# Patient Record
Sex: Female | Born: 1945 | Race: White | Hispanic: No | Smoking: Never smoker
Health system: Southern US, Community
[De-identification: ages and names within clinical notes are randomized; demographics above are authoritative.]

## PROBLEM LIST (undated history)

## (undated) DIAGNOSIS — H269 Unspecified cataract: Secondary | ICD-10-CM

## (undated) DIAGNOSIS — R002 Palpitations: Secondary | ICD-10-CM

## (undated) DIAGNOSIS — E278 Other specified disorders of adrenal gland: Secondary | ICD-10-CM

## (undated) DIAGNOSIS — K760 Fatty (change of) liver, not elsewhere classified: Secondary | ICD-10-CM

## (undated) DIAGNOSIS — Z5189 Encounter for other specified aftercare: Secondary | ICD-10-CM

## (undated) DIAGNOSIS — E279 Disorder of adrenal gland, unspecified: Secondary | ICD-10-CM

## (undated) DIAGNOSIS — K8051 Calculus of bile duct without cholangitis or cholecystitis with obstruction: Secondary | ICD-10-CM

## (undated) DIAGNOSIS — K5790 Diverticulosis of intestine, part unspecified, without perforation or abscess without bleeding: Secondary | ICD-10-CM

## (undated) DIAGNOSIS — Z8719 Personal history of other diseases of the digestive system: Secondary | ICD-10-CM

## (undated) DIAGNOSIS — I5189 Other ill-defined heart diseases: Secondary | ICD-10-CM

## (undated) DIAGNOSIS — D649 Anemia, unspecified: Secondary | ICD-10-CM

## (undated) DIAGNOSIS — K449 Diaphragmatic hernia without obstruction or gangrene: Secondary | ICD-10-CM

## (undated) DIAGNOSIS — K529 Noninfective gastroenteritis and colitis, unspecified: Secondary | ICD-10-CM

## (undated) DIAGNOSIS — K5010 Crohn's disease of large intestine without complications: Secondary | ICD-10-CM

## (undated) DIAGNOSIS — Z9889 Other specified postprocedural states: Secondary | ICD-10-CM

## (undated) DIAGNOSIS — K219 Gastro-esophageal reflux disease without esophagitis: Secondary | ICD-10-CM

## (undated) HISTORY — DX: Diverticulosis of intestine, part unspecified, without perforation or abscess without bleeding: K57.90

## (undated) HISTORY — DX: Encounter for other specified aftercare: Z51.89

## (undated) HISTORY — PX: COLONOSCOPY: SHX174

## (undated) HISTORY — DX: Personal history of other diseases of the digestive system: Z87.19

## (undated) HISTORY — DX: Diaphragmatic hernia without obstruction or gangrene: K44.9

## (undated) HISTORY — DX: Calculus of bile duct without cholangitis or cholecystitis with obstruction: K80.51

## (undated) HISTORY — PX: UPPER GASTROINTESTINAL ENDOSCOPY: SHX188

## (undated) HISTORY — DX: Unspecified cataract: H26.9

## (undated) HISTORY — DX: Disorder of adrenal gland, unspecified: E27.9

## (undated) HISTORY — PX: TUBAL LIGATION: SHX77

## (undated) HISTORY — DX: Fatty (change of) liver, not elsewhere classified: K76.0

## (undated) HISTORY — PX: CATARACT EXTRACTION: SUR2

## (undated) HISTORY — DX: Other specified disorders of adrenal gland: E27.8

## (undated) HISTORY — DX: Crohn's disease of large intestine without complications: K50.10

---

## 2005-11-21 ENCOUNTER — Encounter: Payer: Self-pay | Admitting: Internal Medicine

## 2005-11-22 ENCOUNTER — Encounter: Payer: Self-pay | Admitting: Internal Medicine

## 2005-11-24 ENCOUNTER — Encounter: Payer: Self-pay | Admitting: Internal Medicine

## 2005-11-25 ENCOUNTER — Encounter: Payer: Self-pay | Admitting: Internal Medicine

## 2007-04-03 HISTORY — PX: CHOLECYSTECTOMY: SHX55

## 2007-09-21 ENCOUNTER — Inpatient Hospital Stay (HOSPITAL_COMMUNITY): Admission: EM | Admit: 2007-09-21 | Discharge: 2007-09-30 | Payer: Self-pay | Admitting: Emergency Medicine

## 2007-09-23 ENCOUNTER — Encounter: Payer: Self-pay | Admitting: Internal Medicine

## 2007-09-24 ENCOUNTER — Encounter: Payer: Self-pay | Admitting: Internal Medicine

## 2007-09-25 ENCOUNTER — Ambulatory Visit: Payer: Self-pay | Admitting: Internal Medicine

## 2007-09-26 ENCOUNTER — Encounter (INDEPENDENT_AMBULATORY_CARE_PROVIDER_SITE_OTHER): Payer: Self-pay | Admitting: General Surgery

## 2007-10-16 ENCOUNTER — Encounter: Admission: RE | Admit: 2007-10-16 | Discharge: 2007-10-16 | Payer: Self-pay | Admitting: General Surgery

## 2007-10-29 ENCOUNTER — Encounter (INDEPENDENT_AMBULATORY_CARE_PROVIDER_SITE_OTHER): Payer: Self-pay | Admitting: *Deleted

## 2007-11-27 DIAGNOSIS — K219 Gastro-esophageal reflux disease without esophagitis: Secondary | ICD-10-CM | POA: Insufficient documentation

## 2007-11-27 DIAGNOSIS — K222 Esophageal obstruction: Secondary | ICD-10-CM

## 2007-11-27 DIAGNOSIS — D649 Anemia, unspecified: Secondary | ICD-10-CM | POA: Insufficient documentation

## 2007-11-27 DIAGNOSIS — K2980 Duodenitis without bleeding: Secondary | ICD-10-CM | POA: Insufficient documentation

## 2007-11-27 DIAGNOSIS — K2100 Gastro-esophageal reflux disease with esophagitis, without bleeding: Secondary | ICD-10-CM | POA: Insufficient documentation

## 2007-11-27 DIAGNOSIS — Z8719 Personal history of other diseases of the digestive system: Secondary | ICD-10-CM | POA: Insufficient documentation

## 2007-11-27 HISTORY — DX: Esophageal obstruction: K22.2

## 2007-11-27 HISTORY — DX: Gastro-esophageal reflux disease with esophagitis, without bleeding: K21.00

## 2008-01-02 ENCOUNTER — Telehealth: Payer: Self-pay | Admitting: Internal Medicine

## 2008-01-13 ENCOUNTER — Ambulatory Visit: Payer: Self-pay | Admitting: Internal Medicine

## 2008-01-14 LAB — CONVERTED CEMR LAB
AST: 14 units/L (ref 0–37)
Alkaline Phosphatase: 97 units/L (ref 39–117)
BUN: 20 mg/dL (ref 6–23)
Basophils Relative: 0.8 % (ref 0.0–3.0)
Calcium: 8.9 mg/dL (ref 8.4–10.5)
HCT: 31.1 % — ABNORMAL LOW (ref 36.0–46.0)
Iron: 17 ug/dL — ABNORMAL LOW (ref 42–145)
Lymphocytes Relative: 21.9 % (ref 12.0–46.0)
MCV: 77.3 fL — ABNORMAL LOW (ref 78.0–100.0)
Monocytes Relative: 8.8 % (ref 3.0–12.0)
Neutro Abs: 6.4 10*3/uL (ref 1.4–7.7)
Neutrophils Relative %: 65.6 % (ref 43.0–77.0)
Potassium: 3.8 meq/L (ref 3.5–5.1)
RBC: 4.03 M/uL (ref 3.87–5.11)
RDW: 14 % (ref 11.5–14.6)
Sed Rate: 77 mm/hr — ABNORMAL HIGH (ref 0–22)
Sodium: 137 meq/L (ref 135–145)
Total Protein: 7.9 g/dL (ref 6.0–8.3)
WBC: 9.8 10*3/uL (ref 4.5–10.5)

## 2008-02-16 ENCOUNTER — Ambulatory Visit: Payer: Self-pay | Admitting: Internal Medicine

## 2008-02-17 ENCOUNTER — Encounter: Payer: Self-pay | Admitting: Internal Medicine

## 2008-02-17 LAB — CONVERTED CEMR LAB
Basophils Absolute: 0 10*3/uL (ref 0.0–0.1)
Basophils Relative: 0.5 % (ref 0.0–3.0)
Eosinophils Absolute: 0.3 10*3/uL (ref 0.0–0.7)
HCT: 29.5 % — ABNORMAL LOW (ref 36.0–46.0)
MCHC: 34.1 g/dL (ref 30.0–36.0)
Monocytes Relative: 9.8 % (ref 3.0–12.0)

## 2008-02-24 ENCOUNTER — Ambulatory Visit: Admission: RE | Admit: 2008-02-24 | Discharge: 2008-02-24 | Payer: Self-pay | Admitting: Internal Medicine

## 2008-03-15 ENCOUNTER — Ambulatory Visit: Payer: Self-pay | Admitting: Internal Medicine

## 2008-03-15 LAB — CONVERTED CEMR LAB
Basophils Relative: 0.5 % (ref 0.0–3.0)
Eosinophils Relative: 2.7 % (ref 0.0–5.0)
HCT: 35 % — ABNORMAL LOW (ref 36.0–46.0)
Hemoglobin: 11.9 g/dL — ABNORMAL LOW (ref 12.0–15.0)
MCV: 78.1 fL (ref 78.0–100.0)
Monocytes Absolute: 0.6 10*3/uL (ref 0.1–1.0)
RBC: 4.48 M/uL (ref 3.87–5.11)

## 2009-02-07 ENCOUNTER — Ambulatory Visit: Payer: Self-pay | Admitting: Family Medicine

## 2009-02-07 ENCOUNTER — Inpatient Hospital Stay (HOSPITAL_COMMUNITY): Admission: EM | Admit: 2009-02-07 | Discharge: 2009-02-09 | Payer: Self-pay | Admitting: Emergency Medicine

## 2009-02-09 ENCOUNTER — Encounter: Payer: Self-pay | Admitting: Family Medicine

## 2009-02-14 ENCOUNTER — Ambulatory Visit: Payer: Self-pay | Admitting: Family Medicine

## 2009-02-14 LAB — CONVERTED CEMR LAB: Hemoglobin: 9 g/dL

## 2009-02-16 ENCOUNTER — Ambulatory Visit: Payer: Self-pay | Admitting: Family Medicine

## 2009-07-01 ENCOUNTER — Ambulatory Visit: Payer: Self-pay | Admitting: Family Medicine

## 2009-07-01 ENCOUNTER — Encounter: Payer: Self-pay | Admitting: Family Medicine

## 2009-07-01 LAB — CONVERTED CEMR LAB
Eosinophils Absolute: 0.2 10*3/uL (ref 0.0–0.7)
Ferritin: 12 ng/mL (ref 10–291)
HCT: 38 % (ref 36.0–46.0)
Hemoglobin: 12.4 g/dL (ref 12.0–15.0)
Monocytes Relative: 9 % (ref 3–12)
Neutro Abs: 4.9 10*3/uL (ref 1.7–7.7)
Neutrophils Relative %: 66 % (ref 43–77)
Saturation Ratios: 14 % — ABNORMAL LOW (ref 20–55)
TIBC: 365 ug/dL (ref 250–470)
WBC: 7.4 10*3/uL (ref 4.0–10.5)

## 2009-07-04 ENCOUNTER — Telehealth: Payer: Self-pay | Admitting: Family Medicine

## 2009-07-14 ENCOUNTER — Encounter: Admission: RE | Admit: 2009-07-14 | Discharge: 2009-07-14 | Payer: Self-pay | Admitting: Family Medicine

## 2009-07-19 ENCOUNTER — Telehealth: Payer: Self-pay | Admitting: Family Medicine

## 2009-07-19 ENCOUNTER — Encounter: Payer: Self-pay | Admitting: Family Medicine

## 2009-11-16 ENCOUNTER — Ambulatory Visit: Payer: Self-pay | Admitting: Family Medicine

## 2009-11-16 ENCOUNTER — Encounter: Payer: Self-pay | Admitting: Family Medicine

## 2009-11-16 ENCOUNTER — Encounter: Payer: Self-pay | Admitting: *Deleted

## 2009-11-17 LAB — CONVERTED CEMR LAB
HCT: 36.4 % (ref 36.0–46.0)
Platelets: 293 10*3/uL (ref 150–400)
RDW: 24.6 % — ABNORMAL HIGH (ref 11.5–15.5)
WBC: 5.5 10*3/uL (ref 4.0–10.5)

## 2010-02-01 ENCOUNTER — Ambulatory Visit: Payer: Self-pay | Admitting: Family Medicine

## 2010-02-01 ENCOUNTER — Encounter: Payer: Self-pay | Admitting: Family Medicine

## 2010-02-04 LAB — CONVERTED CEMR LAB
MCV: 83 fL (ref 78.0–100.0)
RDW: 16.7 % — ABNORMAL HIGH (ref 11.5–15.5)

## 2010-04-25 ENCOUNTER — Encounter (INDEPENDENT_AMBULATORY_CARE_PROVIDER_SITE_OTHER): Payer: Self-pay | Admitting: *Deleted

## 2010-05-02 NOTE — Letter (Signed)
Summary: Generic Letter  [facility]  [address]   [place], [place] [address]   Phone: [phone]  Fax: [phone]    07/19/2009  TAMEYAH KOCH [address] [place], [place]  [address]  Dear Ms. Aubert,   I received the results of your DEXA scan and it shows who have some bone diminerlaiztion.  I think at this point as long as we continue the vitamin D and calcium pills we should be fine.  At our next visit I would like to check your vitamin D level and we can decide if any changes would be needed then.  I think we should repeat the DEXA scan again in 2 years.  Hope you are doing well.          Sincerely,   Hulan Saas DO  Appended Document: Generic Letter mailed.

## 2010-05-02 NOTE — Assessment & Plan Note (Signed)
Summary: f/up,tcb   Vital Signs:  Patient profile:   65 year old female Height:      64 inches Weight:      162 pounds BMI:     27.91 BSA:     1.79 Temp:     97.9 degrees F Pulse rate:   87 / minute BP sitting:   141 / 80  Vitals Entered By: Christen Bame CMA (July 01, 2009 9:23 AM) CC: f/u- anemia and DM Is Patient Diabetic? Yes Pain Assessment Patient in pain? no        Primary Care Provider:  Hulan Saas  CC:  f/u- anemia and DM.  History of Present Illness: Pt is here for f/u with her anemia.  Pt is diong ver well, lots of energy, been active somewhat.  Does not feel that her hgb is low, pt has been admitted  multiple times for her anemia but recently seems to be in good control.  Pt is taking iron only once daily at this point, does state dark stool but no blood, no smell.  Pt denies fever, chills, nausea, vomiting, diarrhea or constipation  Pt has hx of GERD and esophageal stricture.  Pt is on omeprazole no problems. Pt has hx of DM from previous d/c from hospital.  Last A1C was 5.0.  Will check again today but pt denies any increase urinary frequency polyphagia  Has not had a mammogram for years.  No family hx of breast cancer Never had a dexa scan.  Take Vitamin D daily Declined pap smear.  Never had abnormal.  Not sexually active    Habits & Providers  Alcohol-Tobacco-Diet     Tobacco Status: never  Current Medications (verified): 1)  Prilosec 20 Mg Cpdr (Omeprazole) .... Take 1 Tablet By Mouth Two Times A Day 2)  Ferrous Sulfate 325 (65 Fe) Mg Tabs (Ferrous Sulfate) .... Take 1 Tab By Mouth Daily 3)  Vitamin D 1000 Unit Tabs (Cholecalciferol) .... Take 1 Tab Daily  Allergies (verified): No Known Drug Allergies  Past History:  Past medical, surgical, family and social histories (including risk factors) reviewed, and no changes noted (except as noted below).  Past Medical History: Reviewed history from 11/27/2007 and no changes required. Current  Problems:  DIABETES MELLITUS (ICD-250.00) ANEMIA (ICD-285.9) GASTRITIS, HX OF (ICD-V12.79) DUODENITIS (ICD-535.60) Hx of ESOPHAGEAL STRICTURE (ICD-530.3) COLITIS, HX OF (ICD-V12.79) GERD (ICD-530.81)  Past Surgical History: Reviewed history from 01/13/2008 and no changes required. Cholecystectomy Tubal Ligation  Family History: Reviewed history from 02/16/2009 and no changes required. Bone cancer  Grandfather Family History of Colon Cancer:Aunt, sister died of stomach cancer  Social History: Reviewed history from 01/13/2008 and no changes required. Occupation: Retired Patient has never smoked.  Alcohol Use - no Daily Caffeine Use Illicit Drug Use - no Patient does not get regular exercise.   Review of Systems       denies fever, chills, nausea, vomiting, diarrhea or constipation    Impression & Recommendations:  Problem # 1:  ANEMIA (ICD-285.9) Assessment Improved  will get labs today.  Pt denies any red flags.  Continue the iron and ppi.   Orders: CBC w/Diff-[facility] (93810) Iron Binding Cap (TIBC)-[facility] ([address]) Iron -[facility] [phone]) Ferritin-[facility] [phone]) [place]- Est  Level 4 ([address]61)  Her updated medication list for this problem includes:    Ferrous Sulfate 325 (65 Fe) Mg Tabs (Ferrous sulfate) .Marland Kitchen... Take 1 tab by mouth daily  Problem # 2:  GERD (ICD-530.81) Assessment: Improved  continue ppi.  Should f/u with GI dr.  Denies any red flaqgs, good weight gain, BMI near 27  Her updated medication list for this problem includes:    Prilosec 20 Mg Cpdr (Omeprazole) .Marland Kitchen... Take 1 tablet by mouth two times a day  Orders: CBC w/Diff-[facility] (97353) [place]- Est  Level 4 ([address]24)  Problem # 3:  DIABETES MELLITUS (ICD-250.00) Assessment: Unchanged Pt A!C again is <6.0, makes me question DM as actual dx.  Pt monitor diet well Orders: A1C-[facility] (26834) [place]- Est  Level 4 ([address]22)  Problem # 4:  Preventive Health Care (ICD-V70.0) Assessment: Comment Only will get  mammogram and Dexa Scan.  Declined pap smear  Complete Medication List: 1)  Prilosec 20 Mg Cpdr (Omeprazole) .... Take 1 tablet by mouth two times a day 2)  Ferrous Sulfate 325 (65 Fe) Mg Tabs (Ferrous sulfate) .... Take 1 tab by mouth daily 3)  Vitamin D 1000 Unit Tabs (Cholecalciferol) .... Take 1 tab daily  Other Orders: Mammogram (Screening) (Mammo) Dexa scan (Dexa scan)  Patient Instructions: 1)  Good to see you keep taking your iron and stomach pill, i think they are helping. 2)  I will call if the results of your lab test is abnormal otherwise no news is good news 3)  I want you to have a mammograqm and dexa scan done.  Our office will set up those appointments and call you. 4)  I want you to see me again in 6-9 months Prescriptions: PRILOSEC 20 MG CPDR (OMEPRAZOLE) Take 1 tablet by mouth two times a day  #60 x 10   Entered and Authorized by:   Hulan Saas DO   Signed by:   Hulan Saas DO on 07/01/2009   Method used:   Electronically to        [facility]  [phone]* (retail)       [address]       [place], [place]  [address]       Ph: [phone] or [phone]       Fax: [phone]   RxID:   [id number]   Laboratory Results   Blood Tests   Date/Time Received: July 01, 2009 9:19 AM  Date/Time Reported: July 01, 2009 9:29 AM   HGBA1C: 5.6%   (Normal Range: Non-Diabetic - 3-6%   Control Diabetic - 6-8%)  Comments: ...........test performed by...........Marland KitchenHedy Camara, CMA     Prevention & Chronic Care Immunizations   Influenza vaccine: Not documented    Tetanus booster: Not documented    Pneumococcal vaccine: Not documented    H. zoster vaccine: Not documented  Colorectal Screening   Hemoccult: Not documented    Colonoscopy: Location[facility].    (09/23/2007)   Colonoscopy due: 09/22/2012  Other Screening   Pap smear: Not documented    Mammogram: Not documented   Mammogram  action/deferral: Ordered  (07/01/2009)    DXA bone density scan: Not documented   DXA bone density action/deferral: Ordered  (07/01/2009)   Smoking status: never  (07/01/2009)  Diabetes Mellitus   HgbA1C: 5.6  (07/01/2009)    Eye exam: Not documented    Foot exam: Not documented   High risk foot: Not documented   Foot care education: Not documented    Urine microalbumin/creatinine ratio: Not documented  Lipids   Total Cholesterol: Not documented   LDL: Not documented   LDL Direct: Not documented   HDL: Not documented   Triglycerides:  Not documented  Self-Management Support :    Diabetes self-management support: Not documented   Nursing Instructions: Schedule screening mammogram (see order) Schedule screening DXA bone density scan (see order)    Appended Document: f/up,tcb need PE     Allergies: No Known Drug Allergies  Physical Exam  General:  NAD, very pleasant. Mouth:  Oral mucosa and oropharynx without lesions or exudates.  Teeth in good repair. Lungs:  Normal respiratory effort, chest expands symmetrically. Lungs are clear to auscultation, no crackles or wheezes. Heart:  Normal rate and regular rhythm. S1 and S2 normal 1/6 SEM LLSB Abdomen:  Bowel sounds positive,abdomen soft and non-tender without masses, organomegaly or hernias noted. Pulses:  2+ Extremities:  no edema Skin:  nail beds are fairly pale, pt states much improvement.   Complete Medication List: 1)  Prilosec 20 Mg Cpdr (Omeprazole) .... Take 1 tablet by mouth two times a day 2)  Ferrous Sulfate 325 (65 Fe) Mg Tabs (Ferrous sulfate) .... Take 1 tab by mouth daily 3)  Vitamin D 1000 Unit Tabs (Cholecalciferol) .... Take 1 tab daily

## 2010-05-02 NOTE — Progress Notes (Signed)
  Phone Note Outgoing Call   Summary of Call: Pt was called to tell lab results.

## 2010-05-02 NOTE — Progress Notes (Signed)
  Have called pt multiple times to tell her the results of her DEXA scan but unable to get through.   Voice mail has a generic answering message so do not feel comfortable leaving a message.

## 2010-05-02 NOTE — Assessment & Plan Note (Signed)
Summary: f/up,tcb   Vital Signs:  Patient profile:   65 year old female Height:      64 inches Weight:      158.8 pounds BMI:     27.36 Temp:     98.1 degrees F oral Pulse rate:   77 / minute BP sitting:   148 / 84  (left arm) Cuff size:   regular  Vitals Entered By: Levert Feinstein LPN (February 02, 1092 10:19 AM) CC: f/u anemia Is Patient Diabetic? Yes Did you bring your meter with you today? No Pain Assessment Patient in pain? no        Primary Care Provider:  Hulan Saas  CC:  f/u anemia.  History of Present Illness: 65 yo female here for  anemia.  Pt is doing very well,good energy,more active.  Pt is taking iron only twice daily at this point, does state dark stool but no blood, no smell, mild constipation but taking a stool softner.  Pt denies fever, chills, nausea, vomiting, diarrhea or constipation  Pt has hx of GERD and esophageal stricture.  Pt is on omeprazole no problems.   Pt has had mammogram and DEXA scan recently  Declined pap smear.  Never had abnormal.  Not sexually active    Habits & Providers  Alcohol-Tobacco-Diet     Tobacco Status: never     Tobacco Counseling: not indicated; no tobacco use  Current Medications (verified): 1)  Prilosec 20 Mg Cpdr (Omeprazole) .... Take 1 Tablet By Mouth Two Times A Day 2)  Ferrous Sulfate 325 (65 Fe) Mg Tabs (Ferrous Sulfate) .... Take 1 Tab Two Times A Day 3)  Vitamin D 1000 Unit Tabs (Cholecalciferol) .... Take 1 Tab Daily  Allergies (verified): No Known Drug Allergies  Past History:  Past medical, surgical, family and social histories (including risk factors) reviewed, and no changes noted (except as noted below).  Past Medical History: Reviewed history from 11/27/2007 and no changes required. Current Problems:  DIABETES MELLITUS (ICD-250.00) ANEMIA (ICD-285.9) GASTRITIS, HX OF (ICD-V12.79) DUODENITIS (ICD-535.60) Hx of ESOPHAGEAL STRICTURE (ICD-530.3) COLITIS, HX OF (ICD-V12.79) GERD  (ICD-530.81)  Past Surgical History: Reviewed history from 01/13/2008 and no changes required. Cholecystectomy Tubal Ligation  Family History: Reviewed history from 02/16/2009 and no changes required. Bone cancer  Grandfather Family History of Colon Cancer:Aunt, sister died of stomach cancer  Social History: Reviewed history from 01/13/2008 and no changes required. Occupation: Retired Patient has never smoked.  Alcohol Use - no Daily Caffeine Use Illicit Drug Use - no Patient does not get regular exercise.   Review of Systems       denies fever, chills, nausea, vomiting, diarrhea or constipation   Physical Exam  General:  NAD, very pleasant. Eyes:  No corneal or conjunctival inflammation noted. EOMI. Perrla. pink conjunctiva Mouth:  Oral mucosa and oropharynx without lesions or exudates.  Teeth in good repair. Lungs:  Normal respiratory effort, chest expands symmetrically. Lungs are clear to auscultation, no crackles or wheezes. Heart:  Normal rate and regular rhythm. S1 and S2 normal 1/6 SEM LLSB, stable Abdomen:  Bowel sounds positive,abdomen soft and non-tender without masses, organomegaly or hernias noted. Pulses:  2+ Extremities:  no edema   Impression & Recommendations:  Problem # 1:  ANEMIA (ICD-285.9) seems stable no red flags, will get CBC and then can change to every 3-6 months.  Gave pt red flags to look for continue same iron regimen will recheck iron levels in 6 months or so.  Her updated  medication list for this problem includes:    Ferrous Sulfate 325 (65 Fe) Mg Tabs (Ferrous sulfate) .Marland Kitchen... Take 1 tab two times a day  Orders: CBC-[facility] (93790) [place]- Est Level  3 (24097)  Complete Medication List: 1)  Prilosec 20 Mg Cpdr (Omeprazole) .... Take 1 tablet by mouth two times a day 2)  Ferrous Sulfate 325 (65 Fe) Mg Tabs (Ferrous sulfate) .... Take 1 tab two times a day 3)  Vitamin D 1000 Unit Tabs (Cholecalciferol) .... Take 1 tab daily  Patient  Instructions: 1)  you are doing great 2)  see me in 3-6 months Prescriptions: FERROUS SULFATE 325 (65 FE) MG TABS (FERROUS SULFATE) take 1 tab two times a day  #186 x 3   Entered and Authorized by:   Hulan Saas DO   Signed by:   Hulan Saas DO on 02/01/2010   Method used:   Electronically to        [facility]  [phone]* (retail)       [address]       [place], [place]  [address]       Ph: [phone] or [phone]       Fax: [phone]   RxID:   [id number]    Orders Added: 1)  CBC-[facility] [85027] 2)  [place]- Est Level  3 [74128]    Prevention & Chronic Care Immunizations   Influenza vaccine: Not documented    Tetanus booster: Not documented    Pneumococcal vaccine: Not documented    H. zoster vaccine: Not documented  Colorectal Screening   Hemoccult: Not documented    Colonoscopy: Location:  [facility].    (09/23/2007)   Colonoscopy due: 09/22/2012  Other Screening   Pap smear: Not documented    Mammogram: ASSESSMENT: Negative - BI-RADS 1^MM DIGITAL SCREENING  (07/14/2009)   Mammogram action/deferral: Ordered  (07/01/2009)    DXA bone density scan: Not documented   DXA bone density action/deferral: Ordered  (07/01/2009)   Smoking status: never  (02/01/2010)  Lipids   Total Cholesterol: Not documented   LDL: Not documented   LDL Direct: 144  (11/16/2009)   HDL: Not documented   Triglycerides: Not documented

## 2010-05-02 NOTE — Miscellaneous (Signed)
  Clinical Lists Changes  Orders: Added new Test order of CBC-[facility] (01599) - Signed Added new Test order of A1C-[facility] (68957) - Signed Added new Test order of Direct LDL-[facility] (02202-669[address]) - Signed   orders for lab of CBC, A1c and direct LDL

## 2010-05-04 NOTE — Letter (Signed)
Summary: Generic Letter  [facility]  [address]   [place], [place] [address]   Phone: [phone]  Fax: [phone]    04/25/2010  [address] [place], [place]  [address]  Dear Ms. Levert,  We are happy to let you know that since you are covered under Medicare you are able to have a FREE Welcome to Medicare visit at the [facility] to discuss your HEALTH. There will be no co-payment.  At this visit you will meet with your doctor and Lamont Dowdy an expert in wellness and the health coach at our clinic.  At this visit we will discuss ways to keep you healthy and feeling well.  This visit will not replace your regular doctor visit and we cannot refill medications.     You will need to plan to be here at least one hour to talk about your medical history, your current status, review all of your medications, and discuss your future plans for your health.  This information will be entered into your record for your doctor to have and review.  If you are interested in staying healthy, this type of visit can help.  Please call the office at: [phone], to schedule a "Medicare Wellness Visit".  The day of the visit you should bring in all of your medications, including any vitamins, herbs, over the counter products you take.  Make a list of all the other doctors that you see, so we know who they are. If you have any other health documents please bring them.  We look forward to helping you stay healthy.  Sincerely,   [facility]  [facility]

## 2010-05-06 ENCOUNTER — Encounter: Payer: Self-pay | Admitting: *Deleted

## 2010-06-16 ENCOUNTER — Emergency Department (HOSPITAL_COMMUNITY)
Admission: EM | Admit: 2010-06-16 | Discharge: 2010-06-16 | Disposition: A | Discharge status: Home / Self Care | Payer: Medicare Other | Attending: Emergency Medicine | Admitting: Emergency Medicine

## 2010-06-16 ENCOUNTER — Encounter (HOSPITAL_COMMUNITY): Payer: Self-pay | Admitting: Radiology

## 2010-06-16 ENCOUNTER — Encounter: Payer: Self-pay | Admitting: Family Medicine

## 2010-06-16 ENCOUNTER — Emergency Department (HOSPITAL_COMMUNITY): Payer: Medicare Other

## 2010-06-16 DIAGNOSIS — K219 Gastro-esophageal reflux disease without esophagitis: Secondary | ICD-10-CM | POA: Insufficient documentation

## 2010-06-16 DIAGNOSIS — R945 Abnormal results of liver function studies: Secondary | ICD-10-CM | POA: Insufficient documentation

## 2010-06-16 DIAGNOSIS — N390 Urinary tract infection, site not specified: Secondary | ICD-10-CM | POA: Insufficient documentation

## 2010-06-16 DIAGNOSIS — R197 Diarrhea, unspecified: Secondary | ICD-10-CM | POA: Insufficient documentation

## 2010-06-16 HISTORY — DX: Gastro-esophageal reflux disease without esophagitis: K21.9

## 2010-06-16 HISTORY — DX: Noninfective gastroenteritis and colitis, unspecified: K52.9

## 2010-06-16 HISTORY — DX: Anemia, unspecified: D64.9

## 2010-06-16 LAB — DIFFERENTIAL
Basophils Absolute: 0 K/uL (ref 0.0–0.1)
Basophils Relative: 0 % (ref 0–1)
Eosinophils Absolute: 0.1 10*3/uL (ref 0.0–0.7)
Eosinophils Relative: 0 % (ref 0–5)
Lymphocytes Relative: 13 % (ref 12–46)
Lymphs Abs: 1.5 10*3/uL (ref 0.7–4.0)
Monocytes Absolute: 1.4 10*3/uL — ABNORMAL HIGH (ref 0.1–1.0)
Monocytes Relative: 12 % (ref 3–12)
Neutro Abs: 8.2 K/uL — ABNORMAL HIGH (ref 1.7–7.7)
Neutrophils Relative %: 74 % (ref 43–77)

## 2010-06-16 LAB — URINALYSIS, ROUTINE W REFLEX MICROSCOPIC
Glucose, UA: NEGATIVE mg/dL
Ketones, ur: 15 mg/dL — AB
Nitrite: POSITIVE — AB
Protein, ur: 30 mg/dL — AB
Specific Gravity, Urine: 1.025 (ref 1.005–1.030)
Urobilinogen, UA: 4 mg/dL — ABNORMAL HIGH (ref 0.0–1.0)
pH: 6 (ref 5.0–8.0)

## 2010-06-16 LAB — COMPREHENSIVE METABOLIC PANEL
Alkaline Phosphatase: 272 U/L — ABNORMAL HIGH (ref 39–117)
BUN: 16 mg/dL (ref 6–23)
CO2: 25 mEq/L (ref 19–32)
Chloride: 108 mEq/L (ref 96–112)
GFR calc Af Amer: 58 mL/min — ABNORMAL LOW (ref >60)
Potassium: 3.9 mEq/L (ref 3.5–5.1)
Sodium: 138 mEq/L (ref 135–145)
Total Bilirubin: 1.8 mg/dL — ABNORMAL HIGH (ref 0.3–1.2)
Total Protein: 8.8 g/dL — ABNORMAL HIGH (ref 6.0–8.3)

## 2010-06-16 LAB — CBC
HCT: 34.9 % — ABNORMAL LOW (ref 36.0–46.0)
Hemoglobin: 11.5 g/dL — ABNORMAL LOW (ref 12.0–15.0)
MCH: 25.7 pg — ABNORMAL LOW (ref 26.0–34.0)
MCHC: 33 g/dL (ref 30.0–36.0)
MCV: 77.9 fL — ABNORMAL LOW (ref 78.0–100.0)
Platelets: 363 10*3/uL (ref 150–400)
RBC: 4.48 MIL/uL (ref 3.87–5.11)
RDW: 14.2 % (ref 11.5–15.5)
WBC: 11.2 10*3/uL — ABNORMAL HIGH (ref 4.0–10.5)

## 2010-06-16 LAB — COMPREHENSIVE METABOLIC PANEL WITH GFR
ALT: 173 U/L — ABNORMAL HIGH (ref 0–35)
AST: 248 U/L — ABNORMAL HIGH (ref 0–37)
Albumin: 3.5 g/dL (ref 3.5–5.2)
Calcium: 9 mg/dL (ref 8.4–10.5)
Creatinine, Ser: 1.14 mg/dL (ref 0.4–1.2)
GFR calc non Af Amer: 48 mL/min — ABNORMAL LOW (ref >60)
Glucose, Bld: 129 mg/dL — ABNORMAL HIGH (ref 70–99)

## 2010-06-16 LAB — URINE MICROSCOPIC-ADD ON

## 2010-06-16 LAB — LIPASE, BLOOD: Lipase: 41 U/L (ref 11–59)

## 2010-06-16 LAB — SAMPLE TO BLOOD BANK

## 2010-06-16 MED ORDER — IOHEXOL 300 MG/ML  SOLN
100.0000 mL | Freq: Once | INTRAMUSCULAR | Status: AC | PRN
  Administered 2010-06-16: 100 mL via INTRAVENOUS

## 2010-06-16 NOTE — H&P (Signed)
[facility] Admission History and Physical  Patient name: Diane Freeman Medical record number: [id number] Date of birth: June 19, 1945 Age: 65 y.o. Gender: female  Primary Care Provider: Hulan Saas, DO, DO  Chief Complaint: vomitting, diarrhea x 1week  History of Present Illness: Diane Freeman is a 65 y.o. year old female presenting with diarrhea that began one week ago. Watery and loose brown stools initially, had some red water in toilet intermittently, no blood in last 2 days. Two days after diarrhea began, also had emesis, the last episode was 2 days prior to admission, NBNB. Describes abdominal soreness associated with vomitting, but no real pain. Last ate some potato salad and chicken yesterday evening, which she did keep down. Is drinking fluids. No known food triggers, sick contacts. Has taken aleve once this week. Has a history of colitis several years ago.  On ROS, has mild dysuria x 1 day, malodorous urine, mild dizzyness with walking. Denies fevers, chills, LOC, HA, visual changes.    Past Medical History:   Diagnoses  . ANEMIA  . ESOPHAGEAL STRICTURE  . GERD  . DUODENITIS  . Personal History of Other Diseases of Digestive Disease    Past Surgical History: CHolecystectomy in 2009  Social History: History   Social History  . Marital Status: Divorced   Social History Main Topics  . Smoking status: Denies  . Smokeless tobacco: Denies  . Alcohol Use: Denies  . Drug Use: Denies   Family History: No family history on file.  Allergies: NKDA  Current Outpatient Prescriptions  Medication Sig Dispense Refill  . Cholecalciferol (VITAMIN D) 1000 UNITS capsule Take 1,000 Units by mouth daily.        . ferrous sulfate 325 (65 FE) MG tablet Take 325 mg by mouth 2 (two) times daily.        Marland Kitchen omeprazole (PRILOSEC) 20 MG capsule Take 20 mg by mouth 2 (two) times daily.         Facility-Administered Medications Ordered in Other Visits    Medication Dose Route Frequency Provider Last Rate Last Dose  . iohexol (OMNIPAQUE) 300 MG/ML injection 100 mL  100 mL Intravenous Once PRN Medication Radiologist   100 mL at 06/16/10 1704   Review Of Systems: Per HPI   Physical Exam: Pulse: 79  Blood Pressure: 137/56 RR: 18   O2: 97 on RA Temp: 97.1  General: alert, cooperative, appears stated age and no distress HEENT: PERRLA, extra ocular movement intact, sclera clear, anicteric and Mucus membranes dry Heart: S1, S2 normal, no murmur, rub or gallop, regular rate and rhythm Lungs: clear to auscultation, no wheezes or rales and unlabored breathing Abdomen: mild tenderness in the in the lower abdomen., no rebound tenderness, no guarding or rigidity, no CVA tenderness Extremities: extremities normal, atraumatic, no cyanosis or edema Skin:no rashes Neurology: normal without focal findings, mental status, speech normal, alert and oriented x3, PERLA, muscle tone and strength normal and symmetric and gait and station normal Musculoskeletal: normal gait, patient arose from bed and ambulated without assistance through hallway. No weakness  Labs and Imaging: Lab Results  Component Value Date/Time   NA 138 06/16/2010 11:41 AM   K 3.9 06/16/2010 11:41 AM   CL 108 06/16/2010 11:41 AM   CO2 25 06/16/2010 11:41 AM   BUN 16 06/16/2010 11:41 AM   CREATININE 1.14 06/16/2010 11:41 AM   GLUCOSE 129* 06/16/2010 11:41 AM   Lab Results  Component Value Date   WBC 11.2* 06/16/2010  HGB 11.5* 06/16/2010   HCT 34.9* 06/16/2010   MCV 77.9* 06/16/2010   PLT 363 06/16/2010  Bilirubin, Total                         1.8        h   Alkaline Phosphatase                     272     SGOT (AST)                               248     SGPT (ALT)                               173     Total  Protein                           8.8        h       Albumin-Blood                            3.5     Calcium                                  9.0        Specific Gravity                          1.025       Urine Glucose                            NEGATIVE        Bilirubin                                LARGE    Ketones                                  15       Blood                                    MODERATE   Protein                                  30        Urobilinogen                             4.0    Nitrite                                  POSITIVE  Leukocytes  LARGE  WBC TNTC, RBC 7-10  Lipase 41  Abd CT: 1. Rectosigmoid colonic diverticula with slightly thickened mucosa   of the distal rectosigmoid colon. These changes may be due to   diverticulosis, but focal colitis is very difficult to exclude.   2.  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   3.  Small to moderate sized hiatal hernia.    Assessment and Plan: Diane Freeman is a 65 y.o. year old female presenting with diverticulitis and UTI. 1. Diverticulitis. CT scan, mild leukocytosis and symptoms c/w diverticulitis, especially with remote history of this condition. Possibly triggered by recent nuts in diet or perhaps viral gastroenteritis.  No fever or significant abdominal pain currently, and overall the patient feels her diarrhea and emesis to be improving. Patient has been observed taking oral flagyl, therefore she should do well on an oral regimen of cipro and flagyl for 14 days. She was administered a single dose of IV cipro in the ER. Currently zofran has diminshed her nausea to the point of hunger.  2. UTI. Has dysuria and UA c/w with outright UTI. Administered ROcephin x 1 in ED. Will continue on cipro to cover for urinary pathogens and diverticulitis. Urine culture ordered in ED, but drawn post-antibiotic unfortunately. Will follow up results, but unlikely to change management.  3. Transaminitis. This is a new problem, but can be explained by possible viral gastroenteritis or  mild dehydration. Will need a repeat CMET when she follows up in clinic next week to ensure resolution. If persistent, would consider Hepatitis panel as patient denies recent tylenol or other medication ingestion. 4. Dehydration. Some dry mucus membranes and increased specific gravity, but no hemodynamic or renal signs of severe volume depletion. Patient can ambulate without observed dizzyness or weakness.  Administered 1L prior to this evaluation. Will recommend one additional liter NS bolus. Patient is observed taking oral fluids. Will give zofran to diminish nausea and further aid her oral hydration. Patient feels confident she can proceed with this plan. 5. Disposition: Patient agrees she feels comfortable being discharged home with oral antibiotics. Has a brother who visits daily and plans to see family members tonight. She will need close follow up and will send note to administrative office to schedule appointment early next week, preferably 3/19 (monday). PATP.

## 2010-06-18 LAB — URINE CULTURE

## 2010-06-19 ENCOUNTER — Encounter: Payer: Self-pay | Admitting: Family Medicine

## 2010-06-19 ENCOUNTER — Ambulatory Visit (INDEPENDENT_AMBULATORY_CARE_PROVIDER_SITE_OTHER): Payer: Medicare Other | Admitting: Family Medicine

## 2010-06-19 DIAGNOSIS — K5732 Diverticulitis of large intestine without perforation or abscess without bleeding: Secondary | ICD-10-CM

## 2010-06-19 DIAGNOSIS — E860 Dehydration: Secondary | ICD-10-CM

## 2010-06-19 DIAGNOSIS — K5792 Diverticulitis of intestine, part unspecified, without perforation or abscess without bleeding: Secondary | ICD-10-CM

## 2010-06-19 NOTE — Patient Instructions (Signed)
It was a pleasure to care for you today.  Please make an appointment for follow up in 2 weeks after completing your antibiotics.  Please return sooner if with any fever, chills, nausea, vomiting, inability to tolerate medication, abdominal pain, or any other concerning symptoms.

## 2010-06-19 NOTE — Progress Notes (Signed)
Subjective:     Diane Freeman is a 65 y.o. female and is here for follow up of dehydration and diverticulitis. The patient reports improvement of her symptoms.. She states she was recently diagnosed with diverticulitis for which she is taking ciprofloxacin and flagyl.  She states she is currently tolerating a light diet without nausea or vomiting or abdominal pain.  She states she was initially diagnosed with dehydration which has improved and she denies any lightheadedness or dizziness.  She just notes some mild fatigue.    History   Social History  . Marital Status: Divorced    Spouse Name: N/A    Number of Children: N/A  . Years of Education: N/A   Occupational History  . Not on file.   Social History Main Topics  . Smoking status: Never Smoker   . Smokeless tobacco: Not on file  . Alcohol Use: No  . Drug Use: No  . Sexually Active: Not on file   Other Topics Concern  . Not on file   Social History Narrative  . No narrative on file   Health Maintenance  Topic Date Due  . Tetanus/tdap  06/06/1963  . Colonoscopy  06/06/1994  . Zostavax  06/05/2004  . Pneumococcal Polysaccharide Vaccine Age 74 And Over  06/05/2009  . Influenza Vaccine  12/31/2009  . Mammogram  07/15/2011    The following portions of the patient's history were reviewed and updated as appropriate: allergies, current medications, past family history, past medical history, past social history, past surgical history and problem list.  Review of Systems Pertinent items are noted in HPI.   Objective:    BP 142/83  Pulse 74  Temp(Src) 98.6 F (37 C) (Oral)  Ht 5' 3"  (1.6 m)  Wt 161 lb 12.8 oz (73.392 kg)  BMI 28.66 kg/m2 General appearance: alert and cooperative Head: Normocephalic, without obvious abnormality, atraumatic Throat: lips, mucosa, and tongue normal; teeth and gums normal and mucous membranes moist Lungs: clear to auscultation bilaterally Heart: regular rate and rhythm, S1, S2 normal, no murmur,  click, rub or gallop Skin: Skin color, texture, turgor normal. No rashes or lesions    Assessment:    Healthy female exam.  Diverticulitis-stable Dehydration-resolved     Plan:     See After Visit Summary for Counseling Recommendations  Dehydration-resolved. Continue supportive care Diverticulitis-continue antibiotics

## 2010-07-04 NOTE — Consult Note (Signed)
NAME:  Diane Freeman, Diane Freeman NO.:  [id number]  MEDICAL RECORD NO.:  [id number]           PATIENT TYPE:  E  LOCATION:  [facility]                         FACILITY:  [facility]  PHYSICIAN:  Dickie La, MD        DATE OF BIRTH:  07-Jul-1945  DATE OF CONSULTATION:  06/16/2010 DATE OF DISCHARGE:                                CONSULTATION   PRIMARY CARE PHYSICIAN:  Hulan Saas, DO at [facility]  CHIEF COMPLAINT:  Vomiting and diarrhea x1 week.  HISTORY OF PRESENT ILLNESS:  This is an 65 year old female with a history of diverticulitis, who presents with diarrhea that began 1week ago.  Her stools were watery and loose brown initially and she did have some red water in the toilet bowl intermittently, however, no blood in her stool for the past 2 days.  Shortly after her diarrhea began, she also had several bouts of emesis.  The last episode occurring 2 days prior to admission.  This emesis was nonbloody and nonbilious and contained only her food.  She also described some abdominal soreness associated with her vomiting, but no actual pain.  The patient last ate some potato salad and chicken yesterday evening, which she did manage to keep down and is drinking fluids.  The patient knows of no new food exposures or triggers to this illness or sick contacts.  She has taken Aleve 1 day this week, but no other medications.  She also had a similar episode of colitis several years ago, but no episodes since then.  REVIEW OF SYSTEMS:  The patient had mild dizziness with walking, some dysuria x1 day, malodorous urine.  She denies fevers, chills, syncope, headaches, visual changes, or shortness of breath.  PAST MEDICAL HISTORY: 1. History of colitis. 2. GERD. 3. Esophageal stricture. 4. Anemia.  PAST SURGICAL HISTORY:  Cholecystectomy in 2009.  SOCIAL HISTORY:  The patient is divorced and currently lives alone. Denies smoking, alcohol, or drug use.  ALLERGIES:  No known  drug allergies.  MEDICATIONS: 1. Vitamin D 1000 units p.o. daily. 2. Ferrous sulfate 325 mg p.o. b.i.d. 3. Omeprazole 20 mg p.o. daily.  PHYSICAL EXAM:  VITALS:  Pulse 79, blood pressure 137/56, respirations 18, O2 saturation 97% on room air, temperature 97.1. GENERAL APPEARANCE:  Alert, cooperative, in no apparent distress. HEENT:  PERRLA, EOMI.  Sclerae clear anicteric and mucous membranes are moderately dry. HEART:  Regular rate and rhythm.  No gallops, murmurs, or rubs. LUNGS:  Clear to auscultation bilaterally.  Nonlabored respirations. ABDOMEN:  Mild tenderness in the bilateral lower abdomen, but no rebound, guarding, or rigidity.  Bowel sounds are positive and no CVA tenderness.  No palpable hepatomegaly. EXTREMITIES:  No edema or cyanosis. NEUROLOGIC:  Alert and oriented x3.  No focal motor deficits.  Gait and strength are within normal limits. MUSCULOSKELETAL:  The patient observed arising from bed unassisted and ambulating with minimal weakness through hallway.  LABS AND IMAGING: 1. Sodium 138, potassium 3.9, chloride 108, bicarb 25, BUN 16,     creatinine 1.14, glucose 129, total bilirubin 1.8, alkaline     phosphatase 272, AST 248,  ALT 173, total protein 8.3, albumin 3.5,     calcium 9.0.  White blood count 11.2, hemoglobin 11.5, hematocrit     34.9.  MCV 77.9, platelets 363. 2. Urinalysis, specific gravity is 1.025, glucose negative, large     bili, 15 ketones, moderate blood, 30 protein, positive nitrites,     large leukocyte esterase, too numerous to count white blood cells,     and 7-10 rbc's. 3. Lipase 41. 4. Abdominal CT showing rectosigmoid colonic diverticula with slightly     thickened mucosa of the distal rectosigmoid colon.  These changes     may be due to diverticulosis, but focal colitis is difficult to     exclude.  Findings also include development of right parapelvic     renal cyst displaying the right pelvocaliceal system with no     evidence of  obstruction and small-to-moderate size hiatal hernia.  ASSESSMENT/PLAN:  This is a 65 year old female, who presents with diverticulitis and urinary tract infection. 1. Diverticulitis.  The patient's CT scan, mild leukocytosis, and     symptoms are consistent with diverticulitis, especially given her     remote history of this condition.  Possibly triggered by recent     dietary intake of nuts or perhaps viral gastroenteritis.  The     patient has no fever or significant abdominal pain currently and     overall she feels her diarrhea and emesis to be improving.  The     patient has been observed taking oral Flagyl, which she tolerated     well and therefore she should continue to do well on a oral regimen     of Ciprofloxacin and Flagyl for 14 days to treat this     diverticulitis.  She was administered a single dose of IV     ciprofloxacin in the ER, but we will continue this orally after     discharge.  She will also take Zofran p.r.n., which has also been     effective to allow further oral fluid hydration. 2. Urinary tract infection.  The patient has dysuria and UA consistent     with urinary tract infection.  She was administered Rocephin x1 in     the emergency department empirically.  We will continue her on     Cipro to cover for urinary pathogens and diverticulitis.  Urine     culture was ordered in the emergency department and will follow up     results in clinic, although unlikely to change management.  The     patient is afebrile and has no signs of upper urinary tract     infection. 3. Transaminitis.  This is a newly observed problem with moderately     elevated LFTs.  This may be explained by possible viral     gastroenteritis or perhaps mild dehydration.  The patient will need     a repeat of LFTs when she follows in the clinic next week to ensure     resolution.  If this persists, would consider hepatitis panel or     possible right upper quadrant ultrasound to evaluate  her liver.     She denies any recent toxins or Tylenol or other medication     ingestions. 4. Dehydration.  The patient does have some dry mucous membranes and     increased specific gravity, but no hemodynamic or renal signs of     severe volume depletion.  The patient can ambulate without observed  dizziness or weakness.  She was administered 1 L normal saline     prior to our evaluation and we recommend one additional liter of     normal saline bolus.  The patient is observed taking oral fluids     well.  We will administer Zofran to diminish nausea and further aid     her oral hydration.  The patient feels comfort, she can proceed     with this plan at home. 5. Disposition.  Discussion with the patient reveals that she would     feel comfortable being discharged to home with oral antibiotics and     antiemetic therapy.  She has a brother, who visits daily and plans     to see family members tonight.  She will need close followup and     was provided with clinic phone number to make appointment early next     week.  I have also sent a note to the administrative office to     schedule her in clinic preferably on Monday, June 19, 2010.  Also     advised the patient to call the emergency line for worsening of her     symptoms     or inability to take oral medication.  The patient is agreeable to     this plan. 6. Incidental renal cyst discovery.  This will need to be followed     with repeat imaging in 4-6 months to ensure resolution.    ______________________________ Luis Abed, MD   ______________________________ Dickie La, MD    JK/MEDQ  D:  06/16/2010  T:  06/17/2010  Job:  [id number]  Electronically Signed by Luis Abed MD on 06/24/2010 05:56:31 PM Electronically Signed by Dorcas Mcmurray MD on 07/04/2010 02:36:11 PM

## 2010-07-05 LAB — COMPREHENSIVE METABOLIC PANEL
ALT: 8 U/L (ref 0–35)
AST: 14 U/L (ref 0–37)
Albumin: 3.3 g/dL — ABNORMAL LOW (ref 3.5–5.2)
Alkaline Phosphatase: 81 U/L (ref 39–117)
CO2: 23 mEq/L (ref 19–32)
Chloride: 107 mEq/L (ref 96–112)
Creatinine, Ser: 0.8 mg/dL (ref 0.4–1.2)
GFR calc Af Amer: >60 mL/min (ref >60)
GFR calc non Af Amer: >60 mL/min (ref >60)
Potassium: 4.2 mEq/L (ref 3.5–5.1)
Sodium: 136 mEq/L (ref 135–145)
Total Bilirubin: 0.4 mg/dL (ref 0.3–1.2)

## 2010-07-05 LAB — URINALYSIS, ROUTINE W REFLEX MICROSCOPIC
Glucose, UA: NEGATIVE mg/dL
Ketones, ur: NEGATIVE mg/dL
Nitrite: NEGATIVE
Protein, ur: NEGATIVE mg/dL
Specific Gravity, Urine: 1.023 (ref 1.005–1.030)
Urobilinogen, UA: 1 mg/dL (ref 0.0–1.0)
pH: 6 (ref 5.0–8.0)

## 2010-07-05 LAB — FOLATE: Folate: 13.9 ng/mL

## 2010-07-05 LAB — TYPE AND SCREEN
ABO/RH(D): O POS
Antibody Screen: NEGATIVE

## 2010-07-05 LAB — CBC
HCT: 26 % — ABNORMAL LOW (ref 36.0–46.0)
MCHC: 30 g/dL (ref 30.0–36.0)
MCHC: 32.2 g/dL (ref 30.0–36.0)
MCV: 69.6 fL — ABNORMAL LOW (ref 78.0–100.0)
Platelets: 235 10*3/uL (ref 150–400)
Platelets: 252 10*3/uL (ref 150–400)
Platelets: 263 10*3/uL (ref 150–400)
RBC: 3.68 MIL/uL — ABNORMAL LOW (ref 3.87–5.11)
RDW: 19.3 % — ABNORMAL HIGH (ref 11.5–15.5)
RDW: 31.3 % — ABNORMAL HIGH (ref 11.5–15.5)
WBC: 7.3 10*3/uL (ref 4.0–10.5)
WBC: 8.9 10*3/uL (ref 4.0–10.5)

## 2010-07-05 LAB — URINE CULTURE
Colony Count: >=100000
Special Requests: NEGATIVE

## 2010-07-05 LAB — HEMOGLOBINOPATHY EVALUATION
Hgb A2 Quant: 2.2 % (ref 2.2–3.2)
Hgb A: 97.8 % (ref 96.8–97.8)

## 2010-07-05 LAB — POCT CARDIAC MARKERS
CKMB, poc: <1 ng/mL — ABNORMAL LOW (ref 1.0–8.0)
Myoglobin, poc: 31.5 ng/mL (ref 12–200)
Troponin i, poc: <0.05 ng/mL (ref 0.00–0.09)

## 2010-07-05 LAB — BASIC METABOLIC PANEL
BUN: 13 mg/dL (ref 6–23)
Calcium: 8.7 mg/dL (ref 8.4–10.5)
Creatinine, Ser: 0.98 mg/dL (ref 0.4–1.2)
GFR calc non Af Amer: 57 mL/min — ABNORMAL LOW (ref >60)
Glucose, Bld: 97 mg/dL (ref 70–99)

## 2010-07-05 LAB — FERRITIN: Ferritin: 3 ng/mL — ABNORMAL LOW (ref 10–291)

## 2010-07-05 LAB — RETICULOCYTES
RBC.: 2.92 MIL/uL — ABNORMAL LOW (ref 3.87–5.11)
Retic Count, Absolute: 49.6 K/uL (ref 19.0–186.0)
Retic Ct Pct: 1.7 % (ref 0.4–3.1)

## 2010-07-05 LAB — DIFFERENTIAL
Basophils Absolute: 0.1 10*3/uL (ref 0.0–0.1)
Eosinophils Absolute: 0.1 10*3/uL (ref 0.0–0.7)
Lymphocytes Relative: 21 % (ref 12–46)
Lymphs Abs: 1.4 10*3/uL (ref 0.7–4.0)
Monocytes Relative: 11 % (ref 3–12)

## 2010-07-05 LAB — HEMOCCULT GUIAC POC 1CARD (OFFICE): Fecal Occult Bld: NEGATIVE

## 2010-07-05 LAB — IRON AND TIBC
Iron: <10 ug/dL — ABNORMAL LOW (ref 42–135)
UIBC: 403 ug/dL

## 2010-07-05 LAB — VITAMIN B12: Vitamin B-12: 202 pg/mL — ABNORMAL LOW (ref 211–911)

## 2010-07-05 LAB — APTT: aPTT: 25 seconds (ref 24–37)

## 2010-07-05 LAB — URINE MICROSCOPIC-ADD ON

## 2010-08-15 NOTE — Op Note (Signed)
Diane Freeman, Diane Freeman NO.:  [id number]   MEDICAL RECORD NO.:  [id number]          PATIENT TYPE:  INP   LOCATION:  44                         FACILITY:  [facility]   PHYSICIAN:  Merri Ray. Grandville Silos, M.D.DATE OF BIRTH:  Jan 10, 1946   DATE OF PROCEDURE:  09/26/2007  DATE OF DISCHARGE:                               OPERATIVE REPORT   PREOPERATIVE DIAGNOSIS:  Chronic cholecystitis.   POSTOPERATIVE DIAGNOSIS:  Necrotic cholecystitis.   PROCEDURE:  Laparoscopic cholecystectomy.   SURGEON:  Merri Ray. Grandville Silos, MD   ASSISTANT:  1. Adin Hector, MD  2. Henreitta Cea, [facility]   ANESTHESIA:  General endotracheal.   HISTORY OF PRESENT ILLNESS:  Ms. Ulibarri is a 65 year old white female  who was admitted with lower GI bleed and colitis.  That has been  stabilized medically.  She has had a history of intermittent right upper  quadrant pain and was noted to have multiple gallstones and some  inflammatory changes of her bladder on CT, so we are proceeding with  cholecystectomy as well during this admission.   PROCEDURE IN DETAIL:  Informed consent was obtained.  The patient was  identified in the preop holding area.  She received intravenous  antibiotics.  She was brought to the operating room and general  anesthesia was administered by the anesthesia staff.  Her abdomen was  prepped and draped in a sterile fashion.  Infraumbilical region was  infiltrated along the previous scars with 0.25% Marcaine with  epinephrine.  Infraumbilical incision was made.  Subcutaneous tissues  were dissected down, revealing the anterior fascia.  This was divided  sharply.  The peritoneal cavity was entered under direct vision without  difficulty.  A 0 Vicryl pursestring suture was placed around the fascial  opening and a Hasson trocar was inserted into the abdomen.  The abdomen  was insufflated with carbon dioxide in a standard fashion.  Under direct  vision, an 11-mm epigastric and two 5-mm  lateral ports were placed.  A  0.25% Marcaine with epinephrine was used at all port sites.  The  gallbladder was wrapped and the omentum.  This omentum was gently peeled  away using cautery to get hemostasis.  This revealed an acutely inflamed  gallbladder with patchy necrosis scattered around.  The gallbladder was  retracted superomedially.  The omental adhesions were gradually swept  down revealing the infundibulum.  Duodenum was kept safe and swept away.  Further dissection revealed the cystic duct, but there was increased  patchy necrosis down in this area.  Further careful dissection was done.  The gallbladder was quite friable with patchy necrosis and falling  apart.  There was also empyema of the gallbladder noted.  The dissection  was continued.  The cystic duct was identified.  The infundibular cystic  duct junction was very friable and this kind of disintegrated during the  dissection.  The cystic duct was then cleaned off and then it was closed  securely with two 2-0 PDS Endoloops, this achieved an excellent closure  with no bile coming out.  The dissection continued revealing at least  one  branch of the cystic artery and this was clipped 3 times proximally  and divided distally with cautery.  The gallbladder was then taken off  the liver bed with Bovie cautery.  There was significant patchy necrosis  especially involving the back wall.  It was removed from the liver bed  and placed in an EndoCatch bag and was taken off the abdomen via the  infraumbilical port site.  The liver bed was then liberally cauterized  to achieve excellent hemostasis.  Once the adequate hemostasis was  achieved, the area was copiously irrigated with several liters of  saline.  Irrigation fluid returned clear.  The liver bed was rechecked  and remained dry.  The Endoloops remain in good position on the cystic  duct stump and clips were in good position.  Remainder of the irrigation  fluid was evacuated.   Two 1/2 pieces of Surgicel were placed in the  liver bed.  A 19-French Blake drain was placed into the gallbladder  fossa and brought out through of the 2 lateral port sites.  The liver  bed remained dry.  Pneumoperitoneum was released.  The 3 wounds were  copiously irrigated.  The infraumbilical fascia was closed at that time  with 0-Vicryl  pursestring suture with care not to trap any  intraabdominal contents.  All 3 skin wounds were then closed with  running 4-0 Vicryl subcuticular stitch.  The wounds were dressed with  Dermabond as well.  Sponge, needle, and instrument counts were all  correct.  We were not able to do the cholangiogram due to the severe  friable necrotic nature of the gallbladder.  The patient had no apparent  complications and was taken to the recovery room in stable condition.      Merri Ray Grandville Silos, M.D.  Electronically Signed     BET/MEDQ  D:  09/26/2007  T:  09/27/2007  Job:  [id number]   cc:   Lowella Bandy. Olevia Perches, MD

## 2010-08-15 NOTE — Consult Note (Signed)
NAMESHAYONA, HIBBITTS NO.:  [id number]   MEDICAL RECORD NO.:  [id number]          PATIENT TYPE:  INP   LOCATION:  38                         FACILITY:  [facility]   PHYSICIAN:  Merri Ray. Grandville Silos, M.D.DATE OF BIRTH:  [birth year]   DATE OF CONSULTATION:  DATE OF DISCHARGE:                                 CONSULTATION   CHIEF COMPLAINT:  Intermittent right upper quadrant pain with  gallstones.   HISTORY OF PRESENT ILLNESS:  Diane Freeman is a very pleasant 65 year old  white female who was admitted for colitis with anemia.  She has been  managed medically and also undergone upper and lower endoscopy by Dr.  Olevia Perches demonstrating nonspecific colitis.  In addition as part of her  workup, she has CT scan of the abdomen and pelvis, which also showed  gallstones including one in the specific duct with mild inflammatory  changes.  By history, the patient has been having intermittent right  upper quadrant pain extending in the subcostal region around to her back  for several months.  She is currently not having any right upper  quadrant pain, and she tolerated her breakfast this morning.  She has no  nausea.   PAST MEDICAL HISTORY:  GERD and anemia.   PAST SURGICAL HISTORY:  Tubal ligation.   ALLERGIES:  No known drug allergies.   CURRENT MEDICATIONS:  1. NovoLog insulin.  2. Protonix.  3. Mesalamine.  4. Ciprofloxacin.  5. Zofran.  6. Tylenol.  7. Oxycodone.  8. Dulcolax.   REVIEW OF SYSTEMS:  GI system includes the intermittent right upper  quadrant pain as described above.  Cardiac:  Negative.  GI:  Negative.  GU:  Negative.  Neuro/Psych:  Negative.  Musculoskeletal:  Negative.   PHYSICAL EXAMINATION:  VITAL SIGNS:  Temperature 100.2, blood pressure  103/58, heart rate 105, and respirations 18.  GENERAL:  She is awake and alert.  She is in no distress.  HEENT:  Oral mucosa is moist.  Pupils are equal.  Sclerae clear with no  icterus present.  NECK:  Supple  with no masses or tenderness.  LYMPH:  No supraclavicular, cervical, or periumbilical lymphadenopathy.  LUNGS:  Clear to auscultation with good respiratory effort.  CARDIOVASCULAR:  Heart is regular.  No murmurs heard.  No significant  distal edema is present.  Impulse is palpable in the left chest.  ABDOMEN:  Soft.  There is no right upper quadrant tenderness.  Bowel  sounds are present.  No organomegaly is noted.  She does have mild  tenderness in the left lower quadrant with no guarding likely represents  her colitis in that location.  MUSCULOSKELETAL:  There is no significant deformity with good motion.  SKIN:  Warm and dry.  No rashes present.   DATA REVIEW:  White blood cell count is 12.4 and hemoglobin 10.  Liver  function tests on September 23, 2007 show AST 20, ALT 14, alkaline  phosphatase 100, and bilirubin 1.7.   IMPRESSION AND PLAN:  1. Symptomatic cholelithiasis with likely chronic cholecystitis.  The      patient has eaten today, so we  will plan laparoscopic      cholecystectomy with intraoperative cholangiogram tomorrow morning      on September 26, 2007.  Procedure risks and benefits were discussed in      detail with the patient including, but not limited to bleeding,      infection, bowel and bile duct injury.  We also talked about the      possibility of need to convert to the open procedure and she      understands and is agreeable.  2. Colitis.  Agree with medical management per GI.   Thank you very much for this consultation.      Merri Ray Grandville Silos, M.D.  Electronically Signed     BET/MEDQ  D:  09/25/2007  T:  09/25/2007  Job:  [id number]   cc:   Lowella Bandy. Olevia Perches, MD  Jana Hakim, M.D.

## 2010-08-15 NOTE — H&P (Signed)
Diane Freeman, Diane Freeman NO.:  [id number]   MEDICAL RECORD NO.:  [id number]          PATIENT TYPE:  INP   LOCATION:  4097                         FACILITY:  [place]   PHYSICIAN:  Jana Hakim, M.D. DATE OF BIRTH:  03-06-1946   DATE OF ADMISSION:  09/20/2007  DATE OF DISCHARGE:                              HISTORY & PHYSICAL   PRIMARY CARE PHYSICIAN:  Unassigned.   CHIEF COMPLAINT:  Chest pain.   HISTORY OF PRESENT ILLNESS:  This is a 65 year old female who presents  to the emergency department with complaints of chest and back pain for  the past 2 days.  The patient  describes having substernal area chest  pain that is radiating into the back.  She also reports having nausea  and vomiting along with lightheadedness.  The patient also reports  having diarrhea or loose stools 3 times a day for the past 5 days and  describes her stools as being black.  As for her chest pain as mentioned  above, it is substernal, radiating into the back and she rates the pain  as being an 8/10 and describes having constant pain.  The patient  reports the pain is worse with deep breaths.   The patient [place] to the [place] area where her family member met her and  brought her to the emergency department.  The patient also reports  having a previous similar episode 1 year ago while in [facility] and  she was hospitalized and had a GI evaluation and  patient reports that  one of the diagnoses was a parasitic infection of some type.  The  patient reports taking an antibiotic therapy at that time.  The patient  reports also being transfused during that hospitalization secondary to a  hemoglobin level that was 5.  The patient also reports having a family  history of many members of her family requiring blood transfusions.   PAST MEDICAL HISTORY:  Significant for gastroesophageal reflux disease  and anemia.   MEDICATIONS:  At this time includes Prilosec 1 tablet p.o. daily and  patient also reports taking iron tablets daily.   ALLERGIES:  NO KNOWN DRUG ALLERGIES.   SOCIAL HISTORY:  The patient is a nonsmoker, nondrinker.   FAMILY HISTORY:  Other than the above is noncontributory.   REVIEW OF SYSTEMS:  Pertinents are also mentioned above.   PHYSICAL EXAMINATION FINDINGS:  This is a pale 65 year old well-  nourished, well-developed female in discomfort, but no acute distress  currently.  VITAL SIGNS:  Temperature 99.8, blood pressure 113/64, heart rate 116,  respirations 60, O2 saturations 96 to 97%.  HEENT EXAMINATION:  Normocephalic, atraumatic.  Pupils equally round,  reactive to light.  Extraocular muscles are intact.  Funduscopic benign.  Oropharynx is clear.  NECK:  Supple, full range of motion.  No thyromegaly, adenopathy or  jugulovenous distention.  CARDIOVASCULAR:  Tachycardiac rate and rhythm.  No murmurs, gallops or  rubs.  LUNGS: Clear to auscultation bilaterally.  ABDOMEN: Positive bowel sounds, soft, nontender, nondistended.  EXTREMITIES:  Without cyanosis, clubbing or edema.  NEUROLOGIC EXAMINATION:  Mild generalized weakness, but otherwise  nonfocal.   LABORATORY STUDIES:  White blood cell count 26.2, hemoglobin 6.7,  hematocrit 21.9, platelets 357, MCV 56.8, neutrophils 89% lymphocytes  4%.  Sodium 133, potassium 3.7, chloride 103, bicarb 21, BUN 19,  creatinine 1.14 and glucose 157, lipase 27, myoglobin 184, CK-MB less  than 1.0, troponin less than 0.05.  Chest x-ray reveals decreased lung  volumes and right basilar atelectasis.   ASSESSMENT:  A 65 year old female being admitted with:  1. Severe microcytic anemia.  2. Chest pain.  3. Nausea, vomiting, diarrhea.  4. Pneumonia versus atelectasis.   PLAN:  The patient will be admitted to telemetry area for cardiac  monitoring.  The patient will be transfused 4 units of packed red blood  cells and IV Protonix has  been ordered q.12 hours.  Stool studies will  be ordered for culture and sensitivity, Clostridium difficile and ova  and parasites.  The patient's hemoglobin levels will be monitored and  patient will be transfused as needed.  A urinalysis and urine culture  have also been ordered and empiric IV antibiotic therapy has been  ordered as well to cover for pneumonia and a possible urinary tract  infection secondary to the patient's leukocytosis and her low grade  fever.  Deep vein thrombosis prophylaxis has been ordered as well with  TED hose and patient has been seen and evaluated in the emergency  department by Gastroenterology, Dr. Delfin Edis, who will arrange for  further GI evaluation.  Attempts will be made to request the patient's  medical records from the hospital at [facility].      Jana Hakim, M.D.  Electronically Signed     HJ/MEDQ  D:  09/21/2007  T:  09/21/2007  Job:  [id number]

## 2010-08-18 NOTE — Discharge Summary (Signed)
Diane Freeman, Diane Freeman NO.:  [id number]   MEDICAL RECORD NO.:  [id number]          PATIENT TYPE:  INP   LOCATION:  2409                         FACILITY:  [facility]   PHYSICIAN:  Helen Hashimoto, MD    DATE OF BIRTH:  1946/01/11   DATE OF ADMISSION:  09/20/2007  DATE OF DISCHARGE:  09/30/2007                               DISCHARGE SUMMARY   DISCHARGE DIAGNOSES:  1. Chronic necrotic cholecystitis.  2. Cholelithiasis.  3. Right middle and lower lobe collapse.  4. Hypoxemia.  5. Esophageal stricture.  6. Gastroesophageal reflux disease.  7. Gastritis.  8. Urinary tract infection.  9. Leukocytosis.   Discharge medications include;  1. Ciprofloxacin 500 mg p.o. twice daily x7 days.  2. Asacol 1200 mg p.o. three times daily.  3. Prilosec 20 mg p.o. twice daily.  4. Percocet 5/325 mg q.4-6 h. as needed.   CONSULTATIONS:  1. GI consult.  2. Surgical consult with Dr. Georganna Skeans on September 25, 2007.   RADIOLOGY STUDIES:  1. Chest x-ray on September 20, 2007, showed no acute abnormality.  2. CT scan of the abdomen with contrast on September 23, 2007, showed 6-mm      stone in the cystic duct and second stone in the gallbladder with      diffuse mild edema of the gallbladder, some inflammation around the      cystic duct.  CT of the pelvis on September 23, 2007, showed tiny amount      of free fluid in the pelvis that has been nonspecific and most      likely related to gallbladder disease.  3. Repeat chest x-ray on September 28, 2007, showed right middle and right      lower lobe collapse.  4. Repeat x-ray on September 29, 2007, showed persistent collapse.  5. Repeat x-ray on September 30, 2007 showed stable exam with improvement      in the collapse.   COURSE OF HOSPITALIZATION:  1. Chronic cholecystitis with necrosis and cholelithiasis.  This      patient was admitted initially with abdominal pain.  The patient      had undergone medical treatment where she was kept n.p.o., put on      IV  fluids, received pain medications as needed, and was started on      antiemetics as needed.  The patient was also started on IV      antibiotics including Cipro and Flagyl.  The patient has been      followed by GI.  Abdominal x-ray in addition to CT of the abdomen      and CT of the pelvis were done, results were mentioned above.  GI      treated the patient initially conservatively, but she was still      having symptoms, so surgical evaluation was requested and was done      by Dr. Grandville Silos.  The patient was taken for lap chole on September 26, 2007, and during that procedure it showed that the patient had      necrotic  cholecystitis.  A drainage was placed.  Following the      procedure, the patient did very well and was started on clear      liquid diet and then was advanced to soft diet.  At the time of      discharge, she was asymptomatic, was able to take soft diet.      Recommendation by Surgery is to continue her on Cipro and Flagyl      for 1 week and then to follow with Surgery as an outpatient.  2. Right middle and lower lobe collapse.  Following her procedure, she      had mild shortness of breath.  A chest x-ray was done and showed      collapse including her right lower and middle lobes and this is      most likely a postoperative collapse.  She was advised just taking      deep breath and had couple of suctions done.  She did very fine.      Her repeat x-ray on September 30, 2007 showed improvement in her      symptoms.  She was advised to follow with her primary doctor and      repeat her x-ray in 1 week following her discharge.  3. Hypoxemia.  She was initially on oxygen and that was tapered down,      and at the time of discharge she was in room air with good      saturation.   DISPOSITION:  Otherwise, other medical conditions were stable in the  hospital.  The patient to be discharged on the above-dictated  medications and will follow with her primary doctor as well as  Dr. Georganna Skeans as an outpatient.   TOTAL ASSESSMENT TIME:  Around 30 minutes.      Helen Hashimoto, MD  Electronically Signed     NAE/MEDQ  D:  10/29/2007  T:  10/30/2007  Job:  [phone]

## 2010-12-28 LAB — TROPONIN I
Troponin I: 0.01
Troponin I: 0.02
Troponin I: 0.04

## 2010-12-28 LAB — DIFFERENTIAL
Basophils Absolute: 0
Eosinophils Absolute: 0
Lymphocytes Relative: 4 — ABNORMAL LOW
Lymphs Abs: 1
Monocytes Absolute: 1.8 — ABNORMAL HIGH
Neutro Abs: 23.4 — ABNORMAL HIGH

## 2010-12-28 LAB — CULTURE, BLOOD (ROUTINE X 2)
Culture: NO GROWTH
Culture: NO GROWTH
Culture: NO GROWTH

## 2010-12-28 LAB — CBC
HCT: 21.9 — ABNORMAL LOW
HCT: 27.9 — ABNORMAL LOW
HCT: 30.7 — ABNORMAL LOW
HCT: 33.1 — ABNORMAL LOW
HCT: 34.4 — ABNORMAL LOW
Hemoglobin: 6.7 — CL
Hemoglobin: 9.1 — ABNORMAL LOW
Hemoglobin: 9.1 — ABNORMAL LOW
Hemoglobin: 9.7 — ABNORMAL LOW
Hemoglobin: 9.9 — ABNORMAL LOW
MCHC: 31.9
MCHC: 32
MCHC: 32.5
MCHC: 32.7
MCV: 69.2 — ABNORMAL LOW
MCV: 69.4 — ABNORMAL LOW
MCV: 69.4 — ABNORMAL LOW
MCV: 69.6 — ABNORMAL LOW
MCV: 69.9 — ABNORMAL LOW
Platelets: 159
Platelets: 164
Platelets: 175
Platelets: 178
Platelets: 225
Platelets: 282
Platelets: 357
RBC: 4
RBC: 4.05
RBC: 4.37
RBC: 4.43
RDW: 34.2 — ABNORMAL HIGH
RDW: 34.5 — ABNORMAL HIGH
RDW: 34.5 — ABNORMAL HIGH
RDW: 35.1 — ABNORMAL HIGH
RDW: 35.1 — ABNORMAL HIGH
RDW: 35.3 — ABNORMAL HIGH
WBC: 11.6 — ABNORMAL HIGH
WBC: 13.9 — ABNORMAL HIGH
WBC: 18.9 — ABNORMAL HIGH
WBC: 26.2 — ABNORMAL HIGH

## 2010-12-28 LAB — BASIC METABOLIC PANEL
BUN: 15
BUN: 22
BUN: 6
CO2: 22
CO2: 22
CO2: 23
Calcium: 8 — ABNORMAL LOW
Calcium: 8.1 — ABNORMAL LOW
Calcium: 8.3 — ABNORMAL LOW
Chloride: 100
Chloride: 105
Chloride: 96
Creatinine, Ser: 0.72
Creatinine, Ser: 1.03
GFR calc Af Amer: >60
GFR calc non Af Amer: 54 — ABNORMAL LOW
Glucose, Bld: 105 — ABNORMAL HIGH
Glucose, Bld: 117 — ABNORMAL HIGH
Glucose, Bld: 122 — ABNORMAL HIGH
Glucose, Bld: 96
Potassium: 3.5
Potassium: 4.1
Sodium: 132 — ABNORMAL LOW
Sodium: 134 — ABNORMAL LOW

## 2010-12-28 LAB — TYPE AND SCREEN
ABO/RH(D): O POS
Antibody Screen: NEGATIVE

## 2010-12-28 LAB — COMPREHENSIVE METABOLIC PANEL
ALT: 13
AST: 16
AST: 24
Albumin: 2.1 — ABNORMAL LOW
Albumin: 2.9 — ABNORMAL LOW
Albumin: 3.5
Alkaline Phosphatase: 117
Alkaline Phosphatase: 86
Alkaline Phosphatase: 94
BUN: 19
BUN: 23
Chloride: 103
Chloride: 103
Creatinine, Ser: 0.76
Creatinine, Ser: 1.1
GFR calc Af Amer: >60
GFR calc Af Amer: >60
Glucose, Bld: 157 — ABNORMAL HIGH
Potassium: 3.3 — ABNORMAL LOW
Potassium: 3.7
Potassium: 4.1
Sodium: 133 — ABNORMAL LOW
Total Bilirubin: 0.9
Total Bilirubin: 1.5 — ABNORMAL HIGH
Total Protein: 5.8 — ABNORMAL LOW
Total Protein: 6.8

## 2010-12-28 LAB — POCT CARDIAC MARKERS
CKMB, poc: <1 — ABNORMAL LOW
Myoglobin, poc: 184
Myoglobin, poc: 245
Operator id: 133351

## 2010-12-28 LAB — OCCULT BLOOD X 1 CARD TO LAB, STOOL: Fecal Occult Bld: NEGATIVE

## 2010-12-28 LAB — URINALYSIS, ROUTINE W REFLEX MICROSCOPIC
Bilirubin Urine: NEGATIVE
Glucose, UA: NEGATIVE
Hgb urine dipstick: NEGATIVE
Ketones, ur: 40 — AB
Ketones, ur: >80 — AB
Nitrite: NEGATIVE
Protein, ur: 30 — AB
Protein, ur: NEGATIVE
Urobilinogen, UA: 1
Urobilinogen, UA: 1

## 2010-12-28 LAB — HEPATIC FUNCTION PANEL
Albumin: 2.3 — ABNORMAL LOW
Total Bilirubin: 1.7 — ABNORMAL HIGH
Total Protein: 6.1

## 2010-12-28 LAB — IRON AND TIBC
Iron: <10 — ABNORMAL LOW
UIBC: 328

## 2010-12-28 LAB — URINE CULTURE
Colony Count: 8000
Colony Count: >=100000
Special Requests: NEGATIVE
Special Requests: NEGATIVE

## 2010-12-28 LAB — CK TOTAL AND CKMB (NOT AT ARMC)
CK, MB: 1.3
Relative Index: INVALID
Relative Index: INVALID

## 2010-12-28 LAB — PREPARE RBC (CROSSMATCH)

## 2010-12-28 LAB — CLOTEST (H. PYLORI), BIOPSY: Helicobacter screen: NEGATIVE

## 2010-12-28 LAB — STOOL CULTURE

## 2010-12-28 LAB — GIARDIA/CRYPTOSPORIDIUM SCREEN(EIA)

## 2010-12-28 LAB — TSH: TSH: 0.704

## 2010-12-28 LAB — HEPARIN LEVEL (UNFRACTIONATED): Heparin Unfractionated: <0.1 — ABNORMAL LOW

## 2010-12-28 LAB — HEMOGLOBIN A1C: Mean Plasma Glucose: 119

## 2010-12-28 LAB — LIPASE, BLOOD: Lipase: 27

## 2010-12-28 LAB — RETICULOCYTES: Retic Count, Absolute: 60

## 2010-12-28 LAB — URINE MICROSCOPIC-ADD ON

## 2010-12-28 LAB — CLOSTRIDIUM DIFFICILE EIA

## 2010-12-28 LAB — VITAMIN B12: Vitamin B-12: 183 — ABNORMAL LOW (ref 211–911)

## 2010-12-28 LAB — HEMOGLOBIN AND HEMATOCRIT, BLOOD
HCT: 22.7 — ABNORMAL LOW
Hemoglobin: 7 — CL

## 2010-12-28 LAB — FOLATE RBC: RBC Folate: 777 — ABNORMAL HIGH

## 2011-02-06 ENCOUNTER — Encounter: Payer: Self-pay | Admitting: Home Health Services

## 2011-05-29 ENCOUNTER — Encounter (HOSPITAL_COMMUNITY): Payer: Self-pay

## 2011-05-29 ENCOUNTER — Emergency Department (HOSPITAL_COMMUNITY): Payer: Medicare Other

## 2011-05-29 ENCOUNTER — Emergency Department (HOSPITAL_COMMUNITY)
Admission: EM | Admit: 2011-05-29 | Discharge: 2011-05-30 | Disposition: A | Discharge status: Home / Self Care | Payer: Medicare Other | Attending: Emergency Medicine | Admitting: Emergency Medicine

## 2011-05-29 ENCOUNTER — Encounter: Payer: Self-pay | Admitting: Family Medicine

## 2011-05-29 ENCOUNTER — Ambulatory Visit (INDEPENDENT_AMBULATORY_CARE_PROVIDER_SITE_OTHER): Payer: Medicare Other | Admitting: Family Medicine

## 2011-05-29 ENCOUNTER — Other Ambulatory Visit: Payer: Self-pay

## 2011-05-29 DIAGNOSIS — M818 Other osteoporosis without current pathological fracture: Secondary | ICD-10-CM | POA: Diagnosis not present

## 2011-05-29 DIAGNOSIS — K5900 Constipation, unspecified: Secondary | ICD-10-CM | POA: Diagnosis not present

## 2011-05-29 DIAGNOSIS — Z136 Encounter for screening for cardiovascular disorders: Secondary | ICD-10-CM

## 2011-05-29 DIAGNOSIS — R112 Nausea with vomiting, unspecified: Secondary | ICD-10-CM | POA: Diagnosis not present

## 2011-05-29 DIAGNOSIS — R079 Chest pain, unspecified: Secondary | ICD-10-CM | POA: Insufficient documentation

## 2011-05-29 DIAGNOSIS — R7989 Other specified abnormal findings of blood chemistry: Secondary | ICD-10-CM | POA: Diagnosis not present

## 2011-05-29 DIAGNOSIS — R945 Abnormal results of liver function studies: Secondary | ICD-10-CM

## 2011-05-29 DIAGNOSIS — R5383 Other fatigue: Secondary | ICD-10-CM

## 2011-05-29 DIAGNOSIS — R10815 Periumbilic abdominal tenderness: Secondary | ICD-10-CM | POA: Diagnosis not present

## 2011-05-29 DIAGNOSIS — K219 Gastro-esophageal reflux disease without esophagitis: Secondary | ICD-10-CM | POA: Insufficient documentation

## 2011-05-29 DIAGNOSIS — D649 Anemia, unspecified: Secondary | ICD-10-CM | POA: Diagnosis not present

## 2011-05-29 DIAGNOSIS — E559 Vitamin D deficiency, unspecified: Secondary | ICD-10-CM | POA: Insufficient documentation

## 2011-05-29 DIAGNOSIS — Z13220 Encounter for screening for lipoid disorders: Secondary | ICD-10-CM | POA: Diagnosis not present

## 2011-05-29 DIAGNOSIS — R0602 Shortness of breath: Secondary | ICD-10-CM | POA: Diagnosis not present

## 2011-05-29 DIAGNOSIS — IMO0001 Reserved for inherently not codable concepts without codable children: Secondary | ICD-10-CM | POA: Insufficient documentation

## 2011-05-29 DIAGNOSIS — R030 Elevated blood-pressure reading, without diagnosis of hypertension: Secondary | ICD-10-CM | POA: Diagnosis not present

## 2011-05-29 DIAGNOSIS — E278 Other specified disorders of adrenal gland: Secondary | ICD-10-CM | POA: Diagnosis not present

## 2011-05-29 DIAGNOSIS — Z9889 Other specified postprocedural states: Secondary | ICD-10-CM | POA: Insufficient documentation

## 2011-05-29 DIAGNOSIS — R5381 Other malaise: Secondary | ICD-10-CM

## 2011-05-29 DIAGNOSIS — Z23 Encounter for immunization: Secondary | ICD-10-CM

## 2011-05-29 DIAGNOSIS — Z79899 Other long term (current) drug therapy: Secondary | ICD-10-CM

## 2011-05-29 DIAGNOSIS — R1033 Periumbilical pain: Secondary | ICD-10-CM | POA: Diagnosis not present

## 2011-05-29 DIAGNOSIS — R109 Unspecified abdominal pain: Secondary | ICD-10-CM | POA: Insufficient documentation

## 2011-05-29 LAB — URINALYSIS, ROUTINE W REFLEX MICROSCOPIC
Glucose, UA: 100 mg/dL — AB
Hgb urine dipstick: NEGATIVE
Ketones, ur: NEGATIVE mg/dL
Nitrite: NEGATIVE
Protein, ur: NEGATIVE mg/dL
Specific Gravity, Urine: 1.019 (ref 1.005–1.030)
Urobilinogen, UA: 4 mg/dL — ABNORMAL HIGH (ref 0.0–1.0)
pH: 7 (ref 5.0–8.0)

## 2011-05-29 LAB — COMPREHENSIVE METABOLIC PANEL
ALT: 181 U/L — ABNORMAL HIGH (ref 0–35)
AST: 211 U/L — ABNORMAL HIGH (ref 0–37)
Albumin: 3.3 g/dL — ABNORMAL LOW (ref 3.5–5.2)
Alkaline Phosphatase: 252 U/L — ABNORMAL HIGH (ref 39–117)
BUN: 18 mg/dL (ref 6–23)
CO2: 23 mEq/L (ref 19–32)
Calcium: 9.3 mg/dL (ref 8.4–10.5)
Chloride: 102 mEq/L (ref 96–112)
Creatinine, Ser: 0.99 mg/dL (ref 0.50–1.10)
GFR calc Af Amer: 67 mL/min — ABNORMAL LOW (ref >90)
GFR calc non Af Amer: 58 mL/min — ABNORMAL LOW (ref >90)
Glucose, Bld: 150 mg/dL — ABNORMAL HIGH (ref 70–99)
Potassium: 3.6 mEq/L (ref 3.5–5.1)
Sodium: 137 mEq/L (ref 135–145)
Total Bilirubin: 1.4 mg/dL — ABNORMAL HIGH (ref 0.3–1.2)
Total Protein: 7.7 g/dL (ref 6.0–8.3)

## 2011-05-29 LAB — LIPASE, BLOOD: Lipase: 53 U/L (ref 11–59)

## 2011-05-29 LAB — CBC WITH DIFFERENTIAL/PLATELET
HCT: 39.1 % (ref 36.0–46.0)
Hemoglobin: 12.5 g/dL (ref 12.0–15.0)
Lymphocytes Relative: 24 % (ref 12–46)
Lymphs Abs: 1.6 10*3/uL (ref 0.7–4.0)
MCHC: 32 g/dL (ref 30.0–36.0)
Monocytes Absolute: 0.5 10*3/uL (ref 0.1–1.0)
Monocytes Relative: 8 % (ref 3–12)
Neutro Abs: 4.4 10*3/uL (ref 1.7–7.7)
RBC: 4.53 MIL/uL (ref 3.87–5.11)
WBC: 6.6 10*3/uL (ref 4.0–10.5)

## 2011-05-29 LAB — DIFFERENTIAL
Basophils Relative: 0 % (ref 0–1)
Eosinophils Absolute: 0 10*3/uL (ref 0.0–0.7)
Eosinophils Relative: 0 % (ref 0–5)
Monocytes Absolute: 0.2 10*3/uL (ref 0.1–1.0)
Monocytes Relative: 2 % — ABNORMAL LOW (ref 3–12)

## 2011-05-29 LAB — LIPID PANEL
LDL Cholesterol: 177 mg/dL — ABNORMAL HIGH (ref 0–99)
Total CHOL/HDL Ratio: 7.4 Ratio
VLDL: 48 mg/dL — ABNORMAL HIGH (ref 0–40)

## 2011-05-29 LAB — POCT I-STAT TROPONIN I

## 2011-05-29 LAB — CBC
HCT: 36.7 % (ref 36.0–46.0)
Hemoglobin: 11.9 g/dL — ABNORMAL LOW (ref 12.0–15.0)
MCH: 27.4 pg (ref 26.0–34.0)
MCHC: 32.4 g/dL (ref 30.0–36.0)
MCV: 84.6 fL (ref 78.0–100.0)
Platelets: 182 10*3/uL (ref 150–400)
RBC: 4.34 MIL/uL (ref 3.87–5.11)
RDW: 14.5 % (ref 11.5–15.5)
WBC: 8.9 10*3/uL (ref 4.0–10.5)

## 2011-05-29 LAB — ANEMIA PANEL
ABS Retic: 95.1 10*3/uL (ref 19.0–186.0)
Iron: 31 ug/dL — ABNORMAL LOW (ref 42–145)
RBC.: 4.53 MIL/uL (ref 3.87–5.11)
TIBC: 433 ug/dL (ref 250–470)
UIBC: 402 ug/dL — ABNORMAL HIGH (ref 125–400)

## 2011-05-29 LAB — URINE MICROSCOPIC-ADD ON

## 2011-05-29 LAB — TROPONIN I: Troponin I: <0.3 ng/mL (ref <0.30)

## 2011-05-29 MED ORDER — CIPROFLOXACIN IN D5W 400 MG/200ML IV SOLN
400.0000 mg | Freq: Once | INTRAVENOUS | Status: AC
  Administered 2011-05-29: 400 mg via INTRAVENOUS

## 2011-05-29 MED ORDER — ONDANSETRON HCL 4 MG/2ML IJ SOLN
INTRAMUSCULAR | Status: AC
  Filled 2011-05-29: qty 2

## 2011-05-29 MED ORDER — CIPROFLOXACIN IN D5W 400 MG/200ML IV SOLN
INTRAVENOUS | Status: AC
  Filled 2011-05-29: qty 200

## 2011-05-29 MED ORDER — IOHEXOL 300 MG/ML  SOLN
100.0000 mL | Freq: Once | INTRAMUSCULAR | Status: AC | PRN
  Administered 2011-05-29: 100 mL via INTRAVENOUS

## 2011-05-29 MED ORDER — IOHEXOL 300 MG/ML  SOLN: 20.0000 mL | INTRAMUSCULAR | Status: AC

## 2011-05-29 MED ORDER — ONDANSETRON HCL 4 MG/2ML IJ SOLN: 4.0000 mg | Freq: Once | INTRAMUSCULAR | Status: DC

## 2011-05-29 MED ORDER — PROMETHAZINE HCL 25 MG/ML IJ SOLN
25.0000 mg | INTRAMUSCULAR | Status: DC | PRN
  Filled 2011-05-29: qty 1

## 2011-05-29 MED ORDER — PROMETHAZINE HCL 25 MG PO TABS: 25.0000 mg | ORAL_TABLET | Freq: Four times a day (QID) | ORAL | Status: DC | PRN

## 2011-05-29 MED ORDER — PROMETHAZINE HCL 25 MG/ML IJ SOLN
12.5000 mg | Freq: Once | INTRAMUSCULAR | Status: AC
  Administered 2011-05-29: 12.5 mg via INTRAVENOUS
  Filled 2011-05-29: qty 1

## 2011-05-29 MED ORDER — CIPROFLOXACIN HCL 500 MG PO TABS: 500.0000 mg | ORAL_TABLET | Freq: Two times a day (BID) | ORAL | Status: AC

## 2011-05-29 MED ORDER — MORPHINE SULFATE 2 MG/ML IJ SOLN
2.0000 mg | Freq: Once | INTRAMUSCULAR | Status: AC
  Administered 2011-05-29: 2 mg via INTRAVENOUS
  Filled 2011-05-29: qty 1

## 2011-05-29 NOTE — Assessment & Plan Note (Signed)
minorly elevated today we'll continue to monitor we'll get a basic metabolic panel as well as lipid profile for risk stratification. No need for treatment encourage exercise and weight management.

## 2011-05-29 NOTE — ED Notes (Signed)
Patient transported to CT 

## 2011-05-29 NOTE — ED Provider Notes (Signed)
History     CSN: [id number]  Arrival date & time 05/29/11  1845   First MD Initiated Contact with Patient 05/29/11 1909      Chief Complaint  Patient presents with  . Abdominal Pain    (Consider location/radiation/quality/duration/timing/severity/associated sxs/prior treatment) HPI  Patient presents to the emergency department with complaints of nausea and vomiting since 2 PM today. The patient states that the pain he is periumbilical and radiates towards her back. The patient was seen this morning by her primary care provider for followup and she was not having any of these symptoms at the time. The patient states the last time she had these pains she had her gallbladder removed and ended up needing a blood transfusion because her hemoglobin was very low. Since then she has been receiving "shots for her blood" to help keep her levels high. The patient also describes having severe chest pain onset prior to arrival which resolved upon my entering the examination room. The patient admits to having had 324 of aspirin. The patient's symptoms as she is now experiencing are the stomach pain the chest pain has resolved, the nausea has resolved. She does admit to having stool that looked different from its normal color. Patient is in no acute distress at this time.  Past Medical History  Diagnosis Date  . Colitis   . GERD (gastroesophageal reflux disease)   . Anemia     Past Surgical History  Procedure Date  . Cholecystectomy 2009    History reviewed. No pertinent family history.  History  Substance Use Topics  . Smoking status: Never Smoker   . Smokeless tobacco: Not on file  . Alcohol Use: No    OB History    Grav Para Term Preterm Abortions TAB SAB Ect Mult Living                  Review of Systems  All other systems reviewed and are negative.    Allergies  Review of patient's allergies indicates no known allergies.  Home Medications   Current Outpatient Rx  Name  Route Sig Dispense Refill  . VITAMIN D 1000 UNITS PO CAPS Oral Take 1,000 Units by mouth every morning.     Marland Kitchen FERROUS SULFATE 325 (65 FE) MG PO TABS Oral Take 325 mg by mouth every morning.     Marland Kitchen OMEPRAZOLE 20 MG PO CPDR Oral Take 20 mg by mouth every morning.     Marland Kitchen CIPROFLOXACIN HCL 500 MG PO TABS Oral Take 1 tablet (500 mg total) by mouth 2 (two) times daily. 20 tablet 0  . PROMETHAZINE HCL 25 MG PO TABS Oral Take 1 tablet (25 mg total) by mouth every 6 (six) hours as needed for nausea. 30 tablet 0    BP 99/42  Pulse 95  Temp(Src) 99.1 F (37.3 C) (Oral)  Resp 21  SpO2 99%  Physical Exam  Nursing note and vitals reviewed. Constitutional: She is oriented to person, place, and time. She appears well-developed and well-nourished.  HENT:  Head: Normocephalic and atraumatic.  Eyes: Pupils are equal, round, and reactive to light.  Neck: Trachea normal, normal range of motion and full passive range of motion without pain. Neck supple.  Cardiovascular: Normal rate, regular rhythm and normal pulses.   Pulmonary/Chest: Effort normal and breath sounds normal. Chest wall is not dull to percussion. She exhibits no tenderness, no crepitus, no edema, no deformity and no retraction.  Abdominal: Soft. Normal appearance and bowel sounds are normal.  She exhibits no distension and no mass. There is tenderness (periumbilical pain). There is no rebound and no guarding.  Musculoskeletal: Normal range of motion.  Neurological: She is oriented to person, place, and time. She has normal strength.  Skin: Skin is warm, dry and intact.  Psychiatric: She has a normal mood and affect. Her speech is normal and behavior is normal. Judgment and thought content normal. Cognition and memory are normal.    ED Course  Procedures (including critical care time)  Labs Reviewed  CBC - Abnormal; Notable for the following:    Hemoglobin 11.9 (*)    All other components within normal limits  DIFFERENTIAL - Abnormal;  Notable for the following:    Neutrophils Relative 95 (*)    Neutro Abs 8.4 (*)    Lymphocytes Relative 3 (*)    Lymphs Abs 0.3 (*)    Monocytes Relative 2 (*)    All other components within normal limits  COMPREHENSIVE METABOLIC PANEL - Abnormal; Notable for the following:    Glucose, Bld 150 (*)    Albumin 3.3 (*)    AST 211 (*)    ALT 181 (*)    Alkaline Phosphatase 252 (*)    Total Bilirubin 1.4 (*)    GFR calc non Af Amer 58 (*)    GFR calc Af Amer 67 (*)    All other components within normal limits  URINALYSIS, ROUTINE W REFLEX MICROSCOPIC - Abnormal; Notable for the following:    Glucose, UA 100 (*)    Bilirubin Urine SMALL (*)    Urobilinogen, UA 4.0 (*)    Leukocytes, UA SMALL (*)    All other components within normal limits  URINE MICROSCOPIC-ADD ON - Abnormal; Notable for the following:    Squamous Epithelial / LPF FEW (*)    Bacteria, UA MANY (*)    All other components within normal limits  LIPASE, BLOOD  TROPONIN I  POCT I-STAT TROPONIN I  TROPONIN I   Dg Chest 2 View  05/29/2011  *RADIOLOGY REPORT*  Clinical Data: Mid chest pain radiating to back.  Shortness of breath.  Nausea vomiting.  CHEST - 2 VIEW  Comparison: 02/07/2009  Findings: Heart size is normal.  Both lungs are clear.  No evidence of pleural effusion.  No mass or lymphadenopathy identified.  A small to moderate hiatal hernia is noted.  IMPRESSION:  1.  No active cardiopulmonary disease. 2.  Small to moderate hiatal hernia.  Original Report Authenticated By: Marlaine Hind, M.D.   Ct Abdomen Pelvis W Contrast  05/29/2011  *RADIOLOGY REPORT*  Clinical Data: Abdominal pain and constipation.  CT ABDOMEN AND PELVIS WITH CONTRAST  Technique:  Multidetector CT imaging of the abdomen and pelvis was performed following the standard protocol during bolus administration of intravenous contrast.  Contrast:  100 ml Omnipaque-300  Comparison: 09/23/2007 and 06/16/2010  Findings: The lung bases are clear.  There is no  evidence of free air.  The gallbladder has been removed.  The mid common bile duct measures 1.2 cm and the distal common bile duct measures up to 1.5 cm.  There is high-density material within the distal common bile duct concerning for stones and sludge.  No evidence for pancreatic duct dilatation.  There is mild intrahepatic biliary dilatation. There is a hiatal hernia and this could represent a paraesophageal hiatal hernia.  There is a stable 1.1 cm nodule in the left adrenal gland which is indeterminate but most likely represents an adenoma based on  its size and stability.  Again noted is a 3.8 cm cyst in the right kidney.  Normal appearance of the spleen.  No gross abnormality to the uterus or adnexal structures.  No free fluid or lymphadenopathy.  Stable small lymph nodes in the periaortic region.  No acute bony abnormalities.  IMPRESSION:  Slightly increased dilatation of the biliary system and concern for multiple filling defects and stones in the distal common bile duct.  Hiatal hernia.  This may represent a paraesophageal hernia.  Small left adrenal nodule is indeterminate.  This nodule has minimally changed since 2009 and likely benign.  Original Report Authenticated By: Markus Daft, M.D.     1. Abnormal LFTs   2. Abdominal pain       MDM  Case discussed with Dr. Audie Pinto who has consulted with [facility] regarding pts CT scan and lab results.   Cipro 500  BID  and phenergan and call Dr. Diona Browner office tomorrow for follow-up. Pt given IV Cipro, i dose before D/C.        Linus Mako, [place] 05/29/11 2341

## 2011-05-29 NOTE — Discharge Instructions (Signed)
Abdominal Pain Abdominal pain can be caused by many things. Your caregiver decides the seriousness of your pain by an examination and possibly blood tests and X-rays. Many cases can be observed and treated at home. Most abdominal pain is not caused by a disease and will probably improve without treatment. However, in many cases, more time must pass before a clear cause of the pain can be found. Before that point, it may not be known if you need more testing, or if hospitalization or surgery is needed. HOME CARE INSTRUCTIONS   Do not take laxatives unless directed by your caregiver.   Take pain medicine only as directed by your caregiver.   Only take over-the-counter or prescription medicines for pain, discomfort, or fever as directed by your caregiver.   Try a clear liquid diet (broth, tea, or water) for as long as directed by your caregiver. Slowly move to a bland diet as tolerated.  SEEK IMMEDIATE MEDICAL CARE IF:   The pain does not go away.   You have a fever.   You keep throwing up (vomiting).   The pain is felt only in portions of the abdomen. Pain in the right side could possibly be appendicitis. In an adult, pain in the left lower portion of the abdomen could be colitis or diverticulitis.   You pass bloody or black tarry stools.  MAKE SURE YOU:   Understand these instructions.   Will watch your condition.   Will get help right away if you are not doing well or get worse.  Document Released: 12/27/2004 Document Revised: 11/29/2010 Document Reviewed: 11/05/2007 [facility] Patient Information 2012 [place].

## 2011-05-29 NOTE — Progress Notes (Signed)
Subjective:     Diane Freeman is a 66 y.o. female and is here for Annual exam. Patient has been doing very well overall but has complained of some fatigue over the past 2 weeks. Patient has a history of anemia of but has been contributed to a diverticulitis. Patient does take iron on a regular basis and has not changed back. Patient denies any shortness of breath or chest pain but wanted to be evaluated before she got to the point where she would need admission. Patient also denies any changes in bowel movement or urinary problems. Patient denies any changes in over-the-counter medications and is felt like she is swallowing well. Patient does have a history of an esophageal stricture.  Preventative care-patient is due for mammogram in April please see other health maintenance.  History   Social History  . Marital Status: Divorced    Spouse Name: N/A    Number of Children: N/A  . Years of Education: N/A   Occupational History  . Not on file.   Social History Main Topics  . Smoking status: Never Smoker   . Smokeless tobacco: Not on file  . Alcohol Use: No  . Drug Use: No  . Sexually Active: Not on file   Other Topics Concern  . Not on file   Social History Narrative  . No narrative on file   Health Maintenance  Topic Date Due  . Mammogram  07/15/2011  . Influenza Vaccine  01/01/2012  . Colonoscopy  05/04/2017  . Tetanus/tdap  05/28/2021  . Pneumococcal Polysaccharide Vaccine Age 35 And Over  Completed  . Zostavax  Addressed    The following portions of the patient's history were reviewed and updated as appropriate: allergies, current medications, past family history, past medical history, past social history, past surgical history and problem list.  Review of Systems Pertinent items are noted in HPI.   Objective:    BP 132/80  Pulse 92  Ht 5' 3"  (1.6 m)  Wt 166 lb (75.297 kg)  BMI 29.41 kg/m2 General appearance: alert and cooperative Head: Normocephalic, without  obvious abnormality, atraumatic Throat: lips, mucosa, and tongue normal; teeth and gums normal and mucous membranes moist Lungs: clear to auscultation bilaterally Heart: regular rate and rhythm, S1, S2 normal, no murmur, click, rub or gallop Skin: Skin color, texture, turgor normal. No rashes or lesions    Assessment:    Healthy female exam.  Will check labs for anemia iron panel vitamin D and lipid panel. Have patient followup as needed. Patient declined DEXA scan. Plan:     See After Visit Summary for Counseling Recommendations     HPI   Review of Systems   Physical Exam

## 2011-05-29 NOTE — Assessment & Plan Note (Signed)
History of hemoglobin as low as 4.0 from likely source of GI bleed due to diverticulosis. Patient's last hemoglobin a year ago was 11.5. We'll continue to monitor closely. CBC drawn today including an anemia panel and B12 level.

## 2011-05-29 NOTE — Assessment & Plan Note (Signed)
Patient appears to be doing very well we'll continue current therapy. If needed 2 we'll increase her PPI but at this time patient seems to be doing well.

## 2011-05-29 NOTE — ED Notes (Signed)
Ct called to let them know pt finished oral contrast.

## 2011-05-29 NOTE — ED Notes (Signed)
Pt with nausea and vomiting since 2pm,  Generalized abdominal pain with radiation to epigastric and back, IV fluid 500 cc given, pt nauseated on arrival, reports usually feels this way when it is time for a blood transfusion

## 2011-05-29 NOTE — Patient Instructions (Signed)
We will get labs on you today. I will call you with the results. Continue taking her vitamin D as well as your iron. It is so good to see you.

## 2011-05-30 ENCOUNTER — Encounter: Payer: Self-pay | Admitting: *Deleted

## 2011-05-30 ENCOUNTER — Telehealth: Payer: Self-pay | Admitting: *Deleted

## 2011-05-30 LAB — BASIC METABOLIC PANEL
BUN: 19 mg/dL (ref 6–23)
Chloride: 104 mEq/L (ref 96–112)
Glucose, Bld: 102 mg/dL — ABNORMAL HIGH (ref 70–99)
Potassium: 4.3 mEq/L (ref 3.5–5.3)
Sodium: 138 mEq/L (ref 135–145)

## 2011-05-30 NOTE — Telephone Encounter (Signed)
Diane Freeman, This pt was in [facility] tonight 05/29/2011 with N&V, abd. pain and abnormal LFT's. Her CT scan suggests dilated bile ducts compared with 06/2010, I need to see her in the office this week. She is to call the office in am, but call her when you get this message. Thanx Scheduled patient on 06/01/11 at 1:00 PM. Left a message for patient to call me.

## 2011-05-30 NOTE — ED Notes (Signed)
RN discussed with MD, pt's BP. PT to finish bolus before d/c.

## 2011-05-30 NOTE — Telephone Encounter (Signed)
Patient's sister called to get appointment date/time. Gave her the appointments

## 2011-05-30 NOTE — ED Notes (Signed)
Pt ambulated with a  Steady gait; VSS; A&Ox3; no signs of distress; no questions; pt transported via wheelchair due to fact she is reporting she is drowsy. Respirations even and unlabored; skin warm and dry.

## 2011-05-30 NOTE — ED Notes (Signed)
Patient is resting comfortably. 

## 2011-05-30 NOTE — ED Notes (Signed)
Pt's antibiotic finished. Pt to be discharged.

## 2011-05-31 NOTE — ED Provider Notes (Signed)
Medical screening examination/treatment/procedure(s) were conducted as a shared visit with non-physician practitioner(s) and myself.  I personally evaluated the patient during the encounter    .Face to face Exam:  General:  A&Ox3 HEENT:  Atraumatic Resp:  Normal effort Abd:  Nondistended Neuro:No focal deficits     Dot Lanes, MD 05/31/11 1551

## 2011-06-01 ENCOUNTER — Encounter: Payer: Self-pay | Admitting: Internal Medicine

## 2011-06-01 ENCOUNTER — Ambulatory Visit: Payer: Medicare Other | Admitting: Internal Medicine

## 2011-06-01 ENCOUNTER — Other Ambulatory Visit (INDEPENDENT_AMBULATORY_CARE_PROVIDER_SITE_OTHER): Payer: Medicare Other

## 2011-06-01 ENCOUNTER — Observation Stay (HOSPITAL_COMMUNITY)
Admission: AD | Admit: 2011-06-01 | Discharge: 2011-06-03 | Disposition: A | Discharge status: Home / Self Care | Payer: Medicare Other | Source: Ambulatory Visit | Attending: Internal Medicine | Admitting: Internal Medicine

## 2011-06-01 VITALS — BP 132/72 | HR 92 | Ht 63.0 in | Wt 164.0 lb

## 2011-06-01 DIAGNOSIS — E876 Hypokalemia: Secondary | ICD-10-CM | POA: Clinically undetermined

## 2011-06-01 DIAGNOSIS — K8051 Calculus of bile duct without cholangitis or cholecystitis with obstruction: Secondary | ICD-10-CM | POA: Diagnosis present

## 2011-06-01 DIAGNOSIS — K831 Obstruction of bile duct: Secondary | ICD-10-CM | POA: Diagnosis not present

## 2011-06-01 DIAGNOSIS — R109 Unspecified abdominal pain: Secondary | ICD-10-CM | POA: Diagnosis not present

## 2011-06-01 DIAGNOSIS — R932 Abnormal findings on diagnostic imaging of liver and biliary tract: Secondary | ICD-10-CM | POA: Diagnosis not present

## 2011-06-01 DIAGNOSIS — K219 Gastro-esophageal reflux disease without esophagitis: Secondary | ICD-10-CM | POA: Diagnosis not present

## 2011-06-01 DIAGNOSIS — K8050 Calculus of bile duct without cholangitis or cholecystitis without obstruction: Secondary | ICD-10-CM | POA: Diagnosis not present

## 2011-06-01 DIAGNOSIS — D509 Iron deficiency anemia, unspecified: Secondary | ICD-10-CM | POA: Insufficient documentation

## 2011-06-01 DIAGNOSIS — Z79899 Other long term (current) drug therapy: Secondary | ICD-10-CM | POA: Diagnosis not present

## 2011-06-01 DIAGNOSIS — Z9089 Acquired absence of other organs: Secondary | ICD-10-CM | POA: Insufficient documentation

## 2011-06-01 DIAGNOSIS — E119 Type 2 diabetes mellitus without complications: Secondary | ICD-10-CM | POA: Insufficient documentation

## 2011-06-01 DIAGNOSIS — R7402 Elevation of levels of lactic acid dehydrogenase (LDH): Secondary | ICD-10-CM

## 2011-06-01 DIAGNOSIS — D649 Anemia, unspecified: Secondary | ICD-10-CM | POA: Diagnosis present

## 2011-06-01 DIAGNOSIS — K5289 Other specified noninfective gastroenteritis and colitis: Secondary | ICD-10-CM

## 2011-06-01 LAB — CBC WITH DIFFERENTIAL/PLATELET
Basophils Relative: 0.4 % (ref 0.0–3.0)
Eosinophils Relative: 1.8 % (ref 0.0–5.0)
Hemoglobin: 12.4 g/dL (ref 12.0–15.0)
Lymphocytes Relative: 15.1 % (ref 12.0–46.0)
MCHC: 33.7 g/dL (ref 30.0–36.0)
Monocytes Relative: 7.8 % (ref 3.0–12.0)
Neutro Abs: 5.6 10*3/uL (ref 1.4–7.7)
RBC: 4.39 Mil/uL (ref 3.87–5.11)

## 2011-06-01 LAB — HEPATIC FUNCTION PANEL
ALT: 111 U/L — ABNORMAL HIGH (ref 0–35)
AST: 99 U/L — ABNORMAL HIGH (ref 0–37)
Albumin: 3.4 g/dL — ABNORMAL LOW (ref 3.5–5.2)
Alkaline Phosphatase: 219 U/L — ABNORMAL HIGH (ref 39–117)
Total Protein: 8 g/dL (ref 6.0–8.3)

## 2011-06-01 MED ORDER — ACETAMINOPHEN 325 MG PO TABS: 650.0000 mg | ORAL_TABLET | Freq: Four times a day (QID) | ORAL | Status: DC | PRN

## 2011-06-01 MED ORDER — HYDROCODONE-ACETAMINOPHEN 5-325 MG PO TABS: 1.0000 | ORAL_TABLET | Freq: Four times a day (QID) | ORAL | Status: DC | PRN

## 2011-06-01 MED ORDER — ONDANSETRON HCL 4 MG PO TABS: 4.0000 mg | ORAL_TABLET | Freq: Four times a day (QID) | ORAL | Status: DC | PRN

## 2011-06-01 MED ORDER — PANTOPRAZOLE SODIUM 40 MG PO TBEC
40.0000 mg | DELAYED_RELEASE_TABLET | Freq: Every day | ORAL | Status: DC
  Administered 2011-06-01: 40 mg via ORAL
  Filled 2011-06-01 (×3): qty 1

## 2011-06-01 MED ORDER — ONDANSETRON HCL 4 MG/2ML IJ SOLN: 4.0000 mg | Freq: Four times a day (QID) | INTRAMUSCULAR | Status: DC | PRN

## 2011-06-01 MED ORDER — ACETAMINOPHEN 650 MG RE SUPP: 650.0000 mg | Freq: Four times a day (QID) | RECTAL | Status: DC | PRN

## 2011-06-01 MED ORDER — HYDROCODONE-ACETAMINOPHEN 5-325 MG PO TABS: 1.0000 | ORAL_TABLET | ORAL | Status: DC | PRN

## 2011-06-01 MED ORDER — AMPICILLIN-SULBACTAM SODIUM 3 (2-1) G IJ SOLR
3.0000 g | Freq: Four times a day (QID) | INTRAMUSCULAR | Status: DC
  Administered 2011-06-01 – 2011-06-02 (×5): 3 g via INTRAVENOUS
  Filled 2011-06-01 (×7): qty 3

## 2011-06-01 MED ORDER — DEXTROSE-NACL 5-0.45 % IV SOLN
INTRAVENOUS | Status: DC
  Administered 2011-06-01 – 2011-06-03 (×4): via INTRAVENOUS

## 2011-06-01 MED ORDER — HYDROMORPHONE HCL PF 1 MG/ML IJ SOLN
1.0000 mg | INTRAMUSCULAR | Status: DC | PRN
  Administered 2011-06-01 (×2): 1 mg via INTRAVENOUS
  Filled 2011-06-01 (×2): qty 1

## 2011-06-01 NOTE — Patient Instructions (Addendum)
You have been scheduled for an ERCP on 06/02/11 @ 2 pm. You are being admitted to [facility] today. Your physician has requested that you go to the basement for the following lab work before leaving today: CBC, PT, Hepatic Function Panel We have sent the following medications to your pharmacy for you to pick up at your convenience: Vicodin CC: Dr Thedore Mins. Tamala Julian

## 2011-06-01 NOTE — H&P (Signed)
Primary Care Physician:  Jacques Earthly, DO Primary Gastroenterologist:  Dr. Olevia Perches  CHIEF COMPLAINT:   CBD stones  HPI: Diane Freeman is a 66 y.o. female seen in the office today by Dr. Berdine Addison with complaints of a four-day history of upper abdominal pain localized to her right upper quadrant and radiating into her back. She had been in the emergency room 5 days ago and at that time had a CT scan showing new dilation of the intrahepatic ducts as well as the common bile duct which measured 12 mm in the mid section and 15 mm distally. She also had a lot of amorphous material noted in the common bile duct which was felt consistent with sludge or stones. Review CT scan in March of 2012 which had shown a normal common bile duct. Her liver tests have been elevated.  She is status post uncomplicated laparoscopic cholecystectomy in June of 2009. During that same admission she was found to be very anemic and had workup done which showed her to have inflammatory bowel disease most likely Crohn's colitis. She's not been seen in her office since that time. She had not been on any medication for her colitis although she's been anemic in taking iron supplements. She denies any problems with diarrhea. Her last colonoscopy was done in June of 2009 showing a poor prep and multiple superficial ulcerations throughout the left colon with biopsy showing chronic active colitis.  Over the past 5 days she has not been on Cipro and Flagyl orally. At the time of her ER visit she was noted to have a total bilirubin of 1.4 alkaline phosphatase of 252 ALT of 181 and AST of 211. Lipase was normal. Decision was made for the patient to undergo ERCP with stone extraction.  Patient is admitted to the hospital this evening with plans for ERCP and stone extraction per Dr. Olevia Perches on 06/02/2011.   Past Medical History  Diagnosis Date  . Colitis   . GERD (gastroesophageal reflux disease)   . Anemia   . Hiatal hernia   .  Adrenal nodule   . Diverticulosis   . Diabetes mellitus   . Hx of gastritis   . History of esophageal stricture     Past Surgical History  Procedure Date  . Cholecystectomy 2009  . Tubal ligation   . Cataract extraction     Prior to Admission medications   Medication Sig Start Date End Date Taking? Authorizing Provider  Cholecalciferol (VITAMIN D) 1000 UNITS capsule Take 1,000 Units by mouth every morning.    Yes Historical Provider, MD  ciprofloxacin (CIPRO) 500 MG tablet Take 1 tablet (500 mg total) by mouth 2 (two) times daily. 05/29/11 06/08/11 Yes Tiffany Marilu Favre, PA  ferrous sulfate 325 (65 FE) MG tablet Take 325 mg by mouth every morning.    Yes Historical Provider, MD  omeprazole (PRILOSEC) 20 MG capsule Take 20 mg by mouth every morning.    Yes Historical Provider, MD  promethazine (PHENERGAN) 25 MG tablet Take 1 tablet (25 mg total) by mouth every 6 (six) hours as needed for nausea. 05/29/11 06/05/11 Yes Tiffany Marilu Favre, PA  HYDROcodone-acetaminophen (NORCO) 5-325 MG per tablet Take 1 tablet by mouth every 6 (six) hours as needed for pain. 06/01/11 06/11/11  Lafayette Dragon, MD    No current outpatient prescriptions on file.    Allergies as of 06/01/2011  . (No Known Allergies)    Family History  Problem Relation Age of Onset  .  Bone cancer Maternal Grandmother   . Colon cancer Maternal Aunt   . Stomach cancer Sister   . Breast cancer Sister     History   Social History  . Marital Status: Divorced    Spouse Name: N/A    Number of Children: N/A  . Years of Education: N/A   Occupational History  . Not on file.   Social History Main Topics  . Smoking status: Never Smoker   . Smokeless tobacco: Never Used  . Alcohol Use: No  . Drug Use: No  . Sexually Active: Not on file   Other Topics Concern  . Not on file   Social History Narrative  . No narrative on file    Review of Systems: Pertinent positive and negative review of systems were noted in the above  HPI section.  All other review of systems was otherwise negative. Physical Exam: Vital signs in last 24 hours: @VSRANGES @   General:   Alert,  Well-developed, well-nourished, pleasant and cooperative in NAD Head:  Normocephalic and atraumatic. Eyes:  Sclera clear, no icterus.   Conjunctiva pink. Ears:  Normal auditory acuity. Nose:  No deformity, discharge,  or lesions. Mouth:  No deformity or lesions.  Oropharynx pink & moist. Neck:  Supple; no masses or thyromegaly. Lungs:  Clear throughout to auscultation.   No wheezes, crackles, or rhonchi. No acute distress. Heart:  Regular rate and rhythm; no murmurs, clicks, rubs,  or gallops. Abdomen:  Soft,  tender in the right upper quadrant along the right costal margin. She also has mild right CVA tenderness Normal bowel sounds, no guarding  Rectal:   brown Hemoccult-positive stool Msk:  Symmetrical without gross deformities. Normal posture. Pulses:  Normal pulses noted. Extremities:  Without clubbing or edema. Neurologic:  Alert and  oriented x4;  grossly normSkin:  Intact without significant lesions or rashes.  Psych:  Alert and cooperative. Normal mood and affect.  Intake/Output from previous day:   Intake/Output this shift: @IOTHISSHIFT @  Lab Results:  Basename 06/01/11 1447 05/29/11 1956  WBC 7.4 8.9  HGB 12.4 11.9*  HCT 36.8 36.7  PLT 258.0 182   BMET  Basename 05/29/11 1956  NA 137  K 3.6  CL 102  CO2 23  GLUCOSE 150*  BUN 18  CREATININE 0.99  CALCIUM 9.3   LFT  Basename 06/01/11 1447  PROT 8.0  ALBUMIN 3.4*  AST 99*  ALT 111*  ALKPHOS 219*  BILITOT 2.9*  BILIDIR 2.1*  IBILI --   PT/INR  Basename 06/01/11 1447  LABPROT 12.8*  INR 1.2*      Impression / Plan:  #34   66 year old  female with five-day history of upper abdominal pain primarily right upper quadrant radiating to the back, elevated liver function study tests and CT scan consistent with choledocholithiasis. #2 previously documented  ulcerative colitis, on no current treatment #3 chronic anemia with history of iron deficiency likely secondary to #2 #4 GERD with history of esophageal stricture  Plan ; patient is admitted to [facility] She will be started on IV Unasyn, and is scheduled for ERCP with stone extraction with Dr. Olevia Perches  tomorrow 06/02/2011.      @RRHLOS @  Diane Freeman  06/01/2011, 4:42 PM

## 2011-06-01 NOTE — Progress Notes (Signed)
Diane Freeman Jun 25, 1945 MRN [id number]    History of Present Illness:  This is a 66 year old white female with a four-day history of upper abdominal pain localized in the right upper quadrant and radiating to her back. She was seen in the emergency room 5 days ago and her CT scan showed new dilatation of intrahepatic radicles as well as of the common bile duct which measured 12 mm in the midsection and 15 mm distally. There was a large amount of amorphous material in the common bile duct consistent with sludge and stones. Patient had a prior CT scan in March 2012 which showed a normal common bile duct. Her liver function tests have been elevated. She had an uncomplicated laparoscopic cholecystectomy in June 2009 for what appeared to be gangrenous gallbladder. During that hospitalization, she was also very anemic and was found to have inflammatory bowel disease, most likely Crohn's colitis. She has not been seen by [place] since then. She has not been on any medication for colitis although she has been anemic and taking iron supplements. She denies diarrhea. While in the emergency room, patient was started on Cipro 500 mg twice a day and Flagyl 250 mg 3 times a day. This is her 5th day on the medication. Her AST was 211, ALT 181, alkaline phosphatase 252, and total bilirubin 1.4. She had a normal lipase of 53. Her last colonoscopy in 2007 and in June 2009 showed a poor prep and multiple superficial ulcerations throughout the left colon which showed chronic active colitis. An upper endoscopy in June 2009 showed a hiatal hernia and esophageal stricture which was dilated.   Past Medical History  Diagnosis Date  . Colitis   . GERD (gastroesophageal reflux disease)   . Anemia   . Hiatal hernia   . Adrenal nodule   . Diverticulosis   . Diabetes mellitus   . Hx of gastritis   . History of esophageal stricture    Past Surgical History  Procedure Date  . Cholecystectomy 2009  . Tubal ligation   .  Cataract extraction     reports that she has never smoked. She has never used smokeless tobacco. She reports that she does not drink alcohol or use illicit drugs. family history includes Bone cancer in her maternal grandmother; Breast cancer in her sister; Colon cancer in her maternal aunt; and Stomach cancer in her sister. No Known Allergies      Review of Systems: Denies dysphagia, odynophagia. Positive for abdominal pain negative for fever or diarrhea  The remainder of the 10 point ROS is negative except as outlined in H&P   Physical Exam: General appearance  Well developed, in no distress. Eyes- non icteric. HEENT nontraumatic, normocephalic. Mouth no lesions, tongue papillated, no cheilosis. Neck supple without adenopathy, thyroid not enlarged, no carotid bruits, no JVD. Lungs Clear to auscultation bilaterally. Cor normal S1, normal S2, regular rhythm, no murmur,  quiet precordium. Abdomen: Soft abdomen and with tenderness on the right costal margin normoactive bowel sounds. No rebound. Liver edge of the costal margin. Mild right CVA tenderness. Rectal: Soft Hemoccult-positive stool Extremities no pedal edema. Skin no lesions. Neurological alert and oriented x 3. Psychological normal mood and affect.  Assessment and Plan:  Problem #1 biliary obstruction based on the CT scan patient's symptoms as well as blood test. Patient has a distal common bile duct obstruction likely from benign disease. I suspect choledocholithiasis. There has been a change in the caliber of her common bile duct from a  normal size to a current 15 mm in a period of 12 months. Her pain is consistent with biliary colic. She needs to continue on Cipro and Flagyl and undergo an ERCP with sphincterotomy and removal of stones from the common bile duct. I will repeat her liver function tests today and give her Vicodin for pain. She will start a low-fat diet.  Problem #2 colitis. Hemoccult-positive stool. Patient was  diagnosed as having inflammatory bowel disease on her last colonoscopy in 2009 but she was treated only briefly before she ran out of her medications. She denies diarrhea or visible blood per rectum but she has remained anemic and iron deficient. She needs to continue her iron supplements. We will address the treatment of her IBD after we take care of her biliary obstruction.   06/01/2011 Delfin Edis

## 2011-06-02 ENCOUNTER — Encounter (HOSPITAL_COMMUNITY)
Admission: AD | Disposition: A | Discharge status: Home / Self Care | Payer: Self-pay | Source: Ambulatory Visit | Attending: Internal Medicine

## 2011-06-02 ENCOUNTER — Inpatient Hospital Stay (HOSPITAL_COMMUNITY): Payer: Medicare Other

## 2011-06-02 ENCOUNTER — Encounter (HOSPITAL_COMMUNITY): Payer: Self-pay | Admitting: *Deleted

## 2011-06-02 DIAGNOSIS — K8051 Calculus of bile duct without cholangitis or cholecystitis with obstruction: Secondary | ICD-10-CM

## 2011-06-02 DIAGNOSIS — K831 Obstruction of bile duct: Secondary | ICD-10-CM | POA: Diagnosis not present

## 2011-06-02 DIAGNOSIS — E876 Hypokalemia: Secondary | ICD-10-CM | POA: Clinically undetermined

## 2011-06-02 DIAGNOSIS — K8050 Calculus of bile duct without cholangitis or cholecystitis without obstruction: Secondary | ICD-10-CM | POA: Diagnosis not present

## 2011-06-02 HISTORY — PX: ERCP: SHX5425

## 2011-06-02 LAB — COMPREHENSIVE METABOLIC PANEL
Albumin: 2.6 g/dL — ABNORMAL LOW (ref 3.5–5.2)
BUN: 10 mg/dL (ref 6–23)
CO2: 22 mEq/L (ref 19–32)
Chloride: 104 mEq/L (ref 96–112)
Creatinine, Ser: 0.85 mg/dL (ref 0.50–1.10)
GFR calc Af Amer: 81 mL/min — ABNORMAL LOW (ref >90)
GFR calc non Af Amer: 70 mL/min — ABNORMAL LOW (ref >90)
Total Bilirubin: 1.6 mg/dL — ABNORMAL HIGH (ref 0.3–1.2)

## 2011-06-02 LAB — CBC
Platelets: 217 10*3/uL (ref 150–400)
RBC: 3.93 MIL/uL (ref 3.87–5.11)
WBC: 5.2 10*3/uL (ref 4.0–10.5)

## 2011-06-02 SURGERY — ERCP, WITH INTERVENTION IF INDICATED
Anesthesia: Moderate Sedation

## 2011-06-02 MED ORDER — MIDAZOLAM HCL 10 MG/2ML IJ SOLN
INTRAMUSCULAR | Status: DC | PRN
  Administered 2011-06-02 (×5): 2 mg via INTRAVENOUS

## 2011-06-02 MED ORDER — SODIUM CHLORIDE 0.9 % IV SOLN
INTRAVENOUS | Status: DC | PRN
  Administered 2011-06-02: 14:00:00

## 2011-06-02 MED ORDER — DIPHENHYDRAMINE HCL 50 MG/ML IJ SOLN
INTRAMUSCULAR | Status: DC | PRN
  Administered 2011-06-02 (×2): 12.5 mg via INTRAVENOUS
  Administered 2011-06-02: 25 mg via INTRAVENOUS

## 2011-06-02 MED ORDER — POTASSIUM CHLORIDE CRYS ER 20 MEQ PO TBCR
40.0000 meq | EXTENDED_RELEASE_TABLET | Freq: Once | ORAL | Status: AC
  Administered 2011-06-02: 40 meq via ORAL
  Filled 2011-06-02: qty 2

## 2011-06-02 MED ORDER — BUTAMBEN-TETRACAINE-BENZOCAINE 2-2-14 % EX AERO
INHALATION_SPRAY | CUTANEOUS | Status: DC | PRN
  Administered 2011-06-02: 2 via TOPICAL

## 2011-06-02 MED ORDER — POTASSIUM CHLORIDE CRYS ER 20 MEQ PO TBCR
40.0000 meq | EXTENDED_RELEASE_TABLET | Freq: Once | ORAL | Status: AC
  Administered 2011-06-02: 40 meq via ORAL
  Filled 2011-06-02 (×2): qty 2

## 2011-06-02 MED ORDER — FENTANYL CITRATE 0.05 MG/ML IJ SOLN
INTRAMUSCULAR | Status: DC | PRN
  Administered 2011-06-02 (×4): 25 ug via INTRAVENOUS

## 2011-06-02 NOTE — H&P (View-Only) (Signed)
Primary Care Physician:  Jacques Earthly, DO Primary Gastroenterologist:  Dr. Olevia Perches  CHIEF COMPLAINT:   CBD stones  HPI: Diane Freeman is a 66 y.o. female seen in the office today by Dr. Berdine Addison with complaints of a four-day history of upper abdominal pain localized to her right upper quadrant and radiating into her back. She had been in the emergency room 5 days ago and at that time had a CT scan showing new dilation of the intrahepatic ducts as well as the common bile duct which measured 12 mm in the mid section and 15 mm distally. She also had a lot of amorphous material noted in the common bile duct which was felt consistent with sludge or stones. Review CT scan in March of 2012 which had shown a normal common bile duct. Her liver tests have been elevated.  She is status post uncomplicated laparoscopic cholecystectomy in June of 2009. During that same admission she was found to be very anemic and had workup done which showed her to have inflammatory bowel disease most likely Crohn's colitis. She's not been seen in her office since that time. She had not been on any medication for her colitis although she's been anemic in taking iron supplements. She denies any problems with diarrhea. Her last colonoscopy was done in June of 2009 showing a poor prep and multiple superficial ulcerations throughout the left colon with biopsy showing chronic active colitis.  Over the past 5 days she has not been on Cipro and Flagyl orally. At the time of her ER visit she was noted to have a total bilirubin of 1.4 alkaline phosphatase of 252 ALT of 181 and AST of 211. Lipase was normal. Decision was made for the patient to undergo ERCP with stone extraction.  Patient is admitted to the hospital this evening with plans for ERCP and stone extraction per Dr. Olevia Perches on 06/02/2011.   Past Medical History  Diagnosis Date  . Colitis   . GERD (gastroesophageal reflux disease)   . Anemia   . Hiatal hernia   .  Adrenal nodule   . Diverticulosis   . Diabetes mellitus   . Hx of gastritis   . History of esophageal stricture     Past Surgical History  Procedure Date  . Cholecystectomy 2009  . Tubal ligation   . Cataract extraction     Prior to Admission medications   Medication Sig Start Date End Date Taking? Authorizing Provider  Cholecalciferol (VITAMIN D) 1000 UNITS capsule Take 1,000 Units by mouth every morning.    Yes Historical Provider, MD  ciprofloxacin (CIPRO) 500 MG tablet Take 1 tablet (500 mg total) by mouth 2 (two) times daily. 05/29/11 06/08/11 Yes Tiffany Marilu Favre, PA  ferrous sulfate 325 (65 FE) MG tablet Take 325 mg by mouth every morning.    Yes Historical Provider, MD  omeprazole (PRILOSEC) 20 MG capsule Take 20 mg by mouth every morning.    Yes Historical Provider, MD  promethazine (PHENERGAN) 25 MG tablet Take 1 tablet (25 mg total) by mouth every 6 (six) hours as needed for nausea. 05/29/11 06/05/11 Yes Tiffany Marilu Favre, PA  HYDROcodone-acetaminophen (NORCO) 5-325 MG per tablet Take 1 tablet by mouth every 6 (six) hours as needed for pain. 06/01/11 06/11/11  Lafayette Dragon, MD    No current outpatient prescriptions on file.    Allergies as of 06/01/2011  . (No Known Allergies)    Family History  Problem Relation Age of Onset  .  Bone cancer Maternal Grandmother   . Colon cancer Maternal Aunt   . Stomach cancer Sister   . Breast cancer Sister     History   Social History  . Marital Status: Divorced    Spouse Name: N/A    Number of Children: N/A  . Years of Education: N/A   Occupational History  . Not on file.   Social History Main Topics  . Smoking status: Never Smoker   . Smokeless tobacco: Never Used  . Alcohol Use: No  . Drug Use: No  . Sexually Active: Not on file   Other Topics Concern  . Not on file   Social History Narrative  . No narrative on file    Review of Systems: Pertinent positive and negative review of systems were noted in the above  HPI section.  All other review of systems was otherwise negative. Physical Exam: Vital signs in last 24 hours: @VSRANGES @   General:   Alert,  Well-developed, well-nourished, pleasant and cooperative in NAD Head:  Normocephalic and atraumatic. Eyes:  Sclera clear, no icterus.   Conjunctiva pink. Ears:  Normal auditory acuity. Nose:  No deformity, discharge,  or lesions. Mouth:  No deformity or lesions.  Oropharynx pink & moist. Neck:  Supple; no masses or thyromegaly. Lungs:  Clear throughout to auscultation.   No wheezes, crackles, or rhonchi. No acute distress. Heart:  Regular rate and rhythm; no murmurs, clicks, rubs,  or gallops. Abdomen:  Soft,  tender in the right upper quadrant along the right costal margin. She also has mild right CVA tenderness Normal bowel sounds, no guarding  Rectal:   brown Hemoccult-positive stool Msk:  Symmetrical without gross deformities. Normal posture. Pulses:  Normal pulses noted. Extremities:  Without clubbing or edema. Neurologic:  Alert and  oriented x4;  grossly normSkin:  Intact without significant lesions or rashes.  Psych:  Alert and cooperative. Normal mood and affect.  Intake/Output from previous day:   Intake/Output this shift: @IOTHISSHIFT @  Lab Results:  Basename 06/01/11 1447 05/29/11 1956  WBC 7.4 8.9  HGB 12.4 11.9*  HCT 36.8 36.7  PLT 258.0 182   BMET  Basename 05/29/11 1956  NA 137  K 3.6  CL 102  CO2 23  GLUCOSE 150*  BUN 18  CREATININE 0.99  CALCIUM 9.3   LFT  Basename 06/01/11 1447  PROT 8.0  ALBUMIN 3.4*  AST 99*  ALT 111*  ALKPHOS 219*  BILITOT 2.9*  BILIDIR 2.1*  IBILI --   PT/INR  Basename 06/01/11 1447  LABPROT 12.8*  INR 1.2*      Impression / Plan:  #108   66 year old  female with five-day history of upper abdominal pain primarily right upper quadrant radiating to the back, elevated liver function study tests and CT scan consistent with choledocholithiasis. #2 previously documented  ulcerative colitis, on no current treatment #3 chronic anemia with history of iron deficiency likely secondary to #2 #4 GERD with history of esophageal stricture  Plan ; patient is admitted to [facility] She will be started on IV Unasyn, and is scheduled for ERCP with stone extraction with Dr. Olevia Perches  tomorrow 06/02/2011.      @RRHLOS @  [place]  06/01/2011, 4:42 PM

## 2011-06-02 NOTE — Progress Notes (Signed)
Clinic to perform ERCP called about transportation and stated that the potassium of 3.2 this am for the pt needed to be addressed prior to the procedure.  Notified MD.  Received new orders.  Will continue to monitor pt. Diane Freeman [facility] 06/02/2011 6:48 AM

## 2011-06-02 NOTE — Progress Notes (Signed)
Patient ID: Diane Freeman, female   DOB: [birth year], 66 y.o.   MRN: [id number] [facility] Progress Note  Subjective: Sore but no severe pain. Can't wait to be able to eat.  Ready for procedure I explained that Dr. Carlean Purl would be doing procedure and she is fine with that . Objective:  Vital signs in last 24 hours: Temp:  [97.5 F (36.4 C)-97.9 F (36.6 C)] 97.9 F (36.6 C) (03/02 5041) Pulse Rate:  [62-92] 86  (03/02 0637) Resp:  [16-18] 18  (03/02 0637) BP: (132-168)/(72-87) 168/87 mmHg (03/02 0637) SpO2:  [95 %-100 %] 96 % (03/02 0637) Weight:  [164 lb (74.39 kg)-164 lb 12.8 oz (74.753 kg)] 164 lb 12.8 oz (74.753 kg) (03/01 1650) Last BM Date: 06/02/11 General:   Alert,  Well-developed,    in NAD Heart:  Regular rate and rhythm; no murmurs Pulm;clear Abdomen:  Soft, tender epigastrium no guarding, Bs+ Neurologic:  Alert and  oriented x4;  grossly normal neurologically. Psych:  Alert and cooperative. Normal mood and affect.  Intake/Output from previous day: 03/01 0701 - 03/02 0700 In: 1043 [I.V.:1043] Out: 250 [Urine:250] Intake/Output this shift:    Lab Results:  Basename 06/02/11 0315 06/01/11 1447  WBC 5.2 7.4  HGB 10.8* 12.4  HCT 32.3* 36.8  PLT 217 258.0   BMET  Basename 06/02/11 0315  NA 134*  K 3.2*  CL 104  CO2 22  GLUCOSE 86  BUN 10  CREATININE 0.85  CALCIUM 8.5   LFT  Basename 06/02/11 0315 06/01/11 1447  PROT 6.6 --  ALBUMIN 2.6* --  AST 64* --  ALT 83* --  ALKPHOS 199* --  BILITOT 1.6* --  BILIDIR -- 2.1*  IBILI -- --   PT/INR  Basename 06/01/11 1447  LABPROT 12.8*  INR 1.2*      Assessment / Plan: 66 yo female with probable multiple CBD stones, symptomatic.  LFT's stable. Plan is for ERCP with stone extraction today On Unasyn Hopefully home tomorrow. Active Problems:  * No active hospital problems. *      LOS: 1 day   Justn Quale  06/02/2011, 9:27 AM

## 2011-06-02 NOTE — Op Note (Signed)
[facility] [place], [place]  [address]  ERCP PROCEDURE REPORT  PATIENT:  Diane Freeman, Diane Freeman  MR#:  [id number] BIRTHDATE:  11/08/1945  GENDER:  female  ENDOSCOPIST:  Gatha Mayer, MD, [facility] ASSISTANT:  William Dalton, Berenda Morale, RN, Alcide Clever, RN, CGRN  PROCEDURE DATE:  06/02/2011 PROCEDURE:  ERCP with sphincterotomy, ERCP with removal of stones ASA CLASS:  Class II  INDICATIONS:  CBD stones - multiple with sludge by CT  MEDICATIONS:   Benadryl 50 mg IV, Fentanyl 100 mg IV, Versed 10 mg IV TOPICAL ANESTHETIC:  Cetacaine Spray  DESCRIPTION OF PROCEDURE:   After the risks benefits and alternatives of the procedure were thoroughly explained, informed consent was obtained.  The Pentax ERCP [id number] endoscope was introduced through the mouth and advanced to the second portion of the duodenum. The esophagus was not seen well. the stomach and duodenum were normal.  The papilla was mildly prominent and erythematous. The sphincterotome passed right into the CBD. Wire advanced and contrast injection showed a diffusely dilated biliary tree with maximum 15-16 mm diameter. In the hilar area there were multiple large immobile filling defects seen causing significant obstruction. There were some smaller mobile filling defects in the distal CBD. Gallbladder was absent, cystic duct stump filled.  The wire was passed into the intrahepatics beyond the large defects. A balloon was passed and used to remove a pigmented hard distal stone and some sludge. Stone 8-10 mm. Then the balloon was used to remove the larger impacted filling defects which were pigmented stones and sludge/debris. Numerous sweeps were required and finally the duct appeared clear.  I did not attempt pancreatogram.  The scope was then completely withdrawn from the patient and the procedure terminated. <<PROCEDUREIMAGES>>  COMPLICATIONS:  None  ENDOSCOPIC IMPRESSION:  1) Multiple CBD  stones and sludge removed after sphincterotomy. 2) diffusely dilated biliary tree s/p cholecystectomy  RECOMMENDATIONS: 1) Observe overnight at least - if ok home in AM - repeat LFT's in 1 week and office follow-up w/in 2 weeks 2) Duct seemed clear but size of duct and number of stones increases risk of residual stones. Sphincterotomy and duct were large and did not think stent would remain in place if left. 3) Consider using ursodeoxycholic acid to reduce future problems - at least temporarily. Need to clarify re: IBD and diarrhea? before using 4) CT scan has suggested paraesophageal hernia - I did not detect that but duodenoscope not adequate for that 5) Preferable to use MAC/GA if available in future  Gatha Mayer, MD, [facility]  CC:  Delfin Edis, MD  n. Lorrin Mais:   Gatha Mayer at 06/02/2011 01:48 PM  Orvis Brill, [phone]

## 2011-06-02 NOTE — Interval H&P Note (Signed)
History and Physical Interval Note:  06/02/2011 12:38 PM  Diane Freeman  has presented today for surgery, with the diagnosis of CBD stones  The various methods of treatment have been discussed with the patient and family. After consideration of risks, benefits and other options for treatment, the patient has consented to  Procedure(s) (LRB): ENDOSCOPIC RETROGRADE CHOLANGIOPANCREATOGRAPHY (ERCP) (N/A) as a surgical intervention .  The patients' history has been reviewed, patient examined, no change in status, stable for surgery.  I have reviewed the patients' chart and labs.  Questions were answered to the patient's satisfaction.     Silvano Rusk

## 2011-06-03 DIAGNOSIS — K831 Obstruction of bile duct: Secondary | ICD-10-CM

## 2011-06-03 DIAGNOSIS — R932 Abnormal findings on diagnostic imaging of liver and biliary tract: Secondary | ICD-10-CM

## 2011-06-03 LAB — CBC
HCT: 34.6 % — ABNORMAL LOW (ref 36.0–46.0)
MCHC: 33.2 g/dL (ref 30.0–36.0)
MCV: 82 fL (ref 78.0–100.0)
RDW: 15 % (ref 11.5–15.5)

## 2011-06-03 LAB — AMYLASE: Amylase: 65 U/L (ref 0–105)

## 2011-06-03 LAB — COMPREHENSIVE METABOLIC PANEL
Albumin: 2.6 g/dL — ABNORMAL LOW (ref 3.5–5.2)
BUN: 4 mg/dL — ABNORMAL LOW (ref 6–23)
Creatinine, Ser: 1 mg/dL (ref 0.50–1.10)
GFR calc Af Amer: 67 mL/min — ABNORMAL LOW (ref >90)
Total Bilirubin: 1.9 mg/dL — ABNORMAL HIGH (ref 0.3–1.2)
Total Protein: 6.9 g/dL (ref 6.0–8.3)

## 2011-06-03 LAB — LIPASE, BLOOD: Lipase: 96 U/L — ABNORMAL HIGH (ref 11–59)

## 2011-06-03 MED ORDER — HYDROCODONE-ACETAMINOPHEN 5-325 MG PO TABS: 1.0000 | ORAL_TABLET | ORAL | Status: AC | PRN

## 2011-06-03 NOTE — Discharge Instructions (Signed)
Come to [facility] on Thursday 3/7 for labs. Call us for any problems with recurrent pain, fever etc.  [phone]

## 2011-06-03 NOTE — Progress Notes (Signed)
Diane Freeman, very good job!, I will follow up LFT's and OV, thanx. DB

## 2011-06-03 NOTE — Discharge Summary (Signed)
[facility] Discharge Summary  Name: Diane Freeman MRN: [id number] DOB: 07/07/1945 66 y.o. PCP:  Jacques Earthly, DO  Date of Admission: 06/01/2011  4:36 PM Date of Discharge: 06/03/2011 Attending Physician: Lafayette Dragon, MD  Discharge Diagnosis:  #1  66 yo female with choledocholithiasis,symptomatic- now stable s/p ERCP,sphicterotomy and stone extraction 06/02/11 #2 ulcerative colitis Active Problems:  ANEMIA  Obstruction of bile duct  Choledocholithiasis with obstruction  Nonspecific elevation of levels of transaminase or lactic acid dehydrogenase (LDH)  Hypokalemia   Consultations:  none  Procedures Performed:  Dg Chest 2 View  05/29/2011  *RADIOLOGY REPORT*  Clinical Data: Mid chest pain radiating to back.  Shortness of breath.  Nausea vomiting.  CHEST - 2 VIEW  Comparison: 02/07/2009  Findings: Heart size is normal.  Both lungs are clear.  No evidence of pleural effusion.  No mass or lymphadenopathy identified.  A small to moderate hiatal hernia is noted.  IMPRESSION:  1.  No active cardiopulmonary disease. 2.  Small to moderate hiatal hernia.  Original Report Authenticated By: Marlaine Hind, M.D.   Ct Abdomen Pelvis W Contrast  05/29/2011  *RADIOLOGY REPORT*  Clinical Data: Abdominal pain and constipation.  CT ABDOMEN AND PELVIS WITH CONTRAST  Technique:  Multidetector CT imaging of the abdomen and pelvis was performed following the standard protocol during bolus administration of intravenous contrast.  Contrast:  100 ml Omnipaque-300  Comparison: 09/23/2007 and 06/16/2010  Findings: The lung bases are clear.  There is no evidence of free air.  The gallbladder has been removed.  The mid common bile duct measures 1.2 cm and the distal common bile duct measures up to 1.5 cm.  There is high-density material within the distal common bile duct concerning for stones and sludge.  No evidence for pancreatic duct dilatation.  There is mild intrahepatic biliary dilatation. There  is a hiatal hernia and this could represent a paraesophageal hiatal hernia.  There is a stable 1.1 cm nodule in the left adrenal gland which is indeterminate but most likely represents an adenoma based on its size and stability.  Again noted is a 3.8 cm cyst in the right kidney.  Normal appearance of the spleen.  No gross abnormality to the uterus or adnexal structures.  No free fluid or lymphadenopathy.  Stable small lymph nodes in the periaortic region.  No acute bony abnormalities.  IMPRESSION:  Slightly increased dilatation of the biliary system and concern for multiple filling defects and stones in the distal common bile duct.  Hiatal hernia.  This may represent a paraesophageal hernia.  Small left adrenal nodule is indeterminate.  This nodule has minimally changed since 2009 and likely benign.  Original Report Authenticated By: Markus Daft, M.D.   Dg Ercp With Sphincterotomy  06/02/2011  *RADIOLOGY REPORT*  Clinical Data: Common bile duct stones  ERCP with sphincterotomy  Comparison:  CT 05/29/2011  Technique:  Multiple spot images obtained with the fluoroscopic device and submitted for interpretation post-procedure.  ERCP was performed by Dr. Carlean Purl.  Findings: Four images are provided.  Initial image demonstrates endoscope within the duodenum with cannulation of the common bile duct.  Guide wire is advanced into the common bile duct and common hepatic duct.  There is severe ductal dilatation. Multiple filling defects are noted.  Balloon sweep was performed.  The final image demonstrates relief of the dilatation and no clear filling defects.   IMPRESSION: ERCP with sphincterotomy and balloon extraction of stones. Decrease in ductal dilatation.  These images  were submitted for radiologic interpretation only. Please see the procedural report for the amount of contrast and the fluoroscopy time utilized.  Original Report Authenticated By: Suzy Bouchard, M.D.    GI Procedures: ERCP 06/02/11 Dr.  Carlean Purl  History/Physical Exam:  See Admission H&P  Admission HPI:  Pt admitted on 06/01/11 with abdominal pain, and elevated lft's with documented choledocholithiasis. She underwent ERCP and stone extraction on 3/2  With Dr. Carlean Purl with removal of multiple stones and sludge. She tolerated procedure well and feels much better. She will follow up labs later this week, and follow up with Dr. Olevia Perches in 2 weeks. She will complete 2 more days of abx-cipor and flagyl  Which she has at home.  Hospital Course by problem list: see above Active Problems:  ANEMIA  Obstruction of bile duct  Choledocholithiasis with obstruction  Nonspecific elevation of levels of transaminase or lactic acid dehydrogenase (LDH)  Hypokalemia   Discharge Vitals:  BP 141/72  Pulse 78  Temp(Src) 97.5 F (36.4 C) (Oral)  Resp 19  Ht 5' 3"  (1.6 m)  Wt 164 lb 12.8 oz (74.753 kg)  BMI 29.19 kg/m2  SpO2 96%  Discharge Labs:  Results for orders placed during the hospital encounter of 06/01/11 (from the past 24 hour(s))  CBC     Status: Abnormal   Collection Time   06/03/11  4:00 AM      Component Value Range   WBC 6.9  4.0 - 10.5 (K/uL)   RBC 4.22  3.87 - 5.11 (MIL/uL)   Hemoglobin 11.5 (*) 12.0 - 15.0 (g/dL)   HCT 34.6 (*) 36.0 - 46.0 (%)   MCV 82.0  78.0 - 100.0 (fL)   MCH 27.3  26.0 - 34.0 (pg)   MCHC 33.2  30.0 - 36.0 (g/dL)   RDW 15.0  11.5 - 15.5 (%)   Platelets 259  150 - 400 (K/uL)  COMPREHENSIVE METABOLIC PANEL     Status: Abnormal   Collection Time   06/03/11  4:00 AM      Component Value Range   Sodium 135  135 - 145 (mEq/L)   Potassium 3.9  3.5 - 5.1 (mEq/L)   Chloride 103  96 - 112 (mEq/L)   CO2 24  19 - 32 (mEq/L)   Glucose, Bld 114 (*) 70 - 99 (mg/dL)   BUN 4 (*) 6 - 23 (mg/dL)   Creatinine, Ser 1.00  0.50 - 1.10 (mg/dL)   Calcium 8.9  8.4 - 10.5 (mg/dL)   Total Protein 6.9  6.0 - 8.3 (g/dL)   Albumin 2.6 (*) 3.5 - 5.2 (g/dL)   AST 99 (*) 0 - 37 (U/L)   ALT 90 (*) 0 - 35 (U/L)   Alkaline  Phosphatase 219 (*) 39 - 117 (U/L)   Total Bilirubin 1.9 (*) 0.3 - 1.2 (mg/dL)   GFR calc non Af Amer 57 (*) >90 (mL/min)   GFR calc Af Amer 67 (*) >90 (mL/min)  AMYLASE     Status: Normal   Collection Time   06/03/11  4:00 AM      Component Value Range   Amylase 65  0 - 105 (U/L)  LIPASE, BLOOD     Status: Abnormal   Collection Time   06/03/11  4:00 AM      Component Value Range   Lipase 96 (*) 11 - 59 (U/L)    Disposition and follow-up:   [facility]  Follow-up Appointments: Dr.  Brodie 2 weeks -office will call pt with appt time   Discharge Medications: Medication List  As of 06/03/2011  9:59 AM   STOP taking these medications         promethazine 25 MG tablet         TAKE these medications         ciprofloxacin 500 MG tablet   Commonly known as: CIPRO   Take 1 tablet (500 mg total) by mouth 2 (two) times daily.      ferrous sulfate 325 (65 FE) MG tablet   Take 325 mg by mouth every morning.      HYDROcodone-acetaminophen 5-325 MG per tablet   Commonly known as: NORCO   Take 1-2 tablets by mouth every 4 (four) hours as needed.      omeprazole 20 MG capsule   Commonly known as: PRILOSEC   Take 20 mg by mouth every morning.      Vitamin D 1000 UNITS capsule   Take 1,000 Units by mouth every morning.            Signed: Nicoletta Ba 06/03/2011, 9:59 AM

## 2011-06-03 NOTE — Progress Notes (Signed)
Patient discharged ome accompanied by sister. All prescriptions and current medications explained. Discharge education and follow-up completed. Patient in stable condition.

## 2011-06-03 NOTE — Progress Notes (Signed)
Patient ID: Diane Freeman, female   DOB: 1945-05-24, 66 y.o.   MRN: [id number] [facility] Progress Note  Subjective: Feels good- no pain, and eating without difficulty. -Would like to go home.  Objective:  Vital signs in last 24 hours: Temp:  [97.5 F (36.4 C)-97.9 F (36.6 C)] 97.5 F (36.4 C) (03/03 0530) Pulse Rate:  [63-78] 78  (03/03 0530) Resp:  [13-40] 19  (03/03 0530) BP: (106-199)/(52-138) 141/72 mmHg (03/03 0530) SpO2:  [93 %-100 %] 96 % (03/03 0530) Last BM Date: 06/02/11 General:   Alert,  Well-developed,    in NAD Heart:  Regular rate and rhythm; no murmurs Pulm;clear Abdomen:  Soft, nontender and nondistended. Normal bowel sounds, without guarding Extremities:  Without edema. Neurologic:  Alert and  oriented x4;  grossly normal neurologically. Psych:  Alert and cooperative. Normal mood and affect.  Intake/Output from previous day: 03/02 0701 - 03/03 0700 In: 3376 [P.O.:1580; I.V.:1796] Out: 1300 [Urine:1300] Intake/Output this shift:    Lab Results:  Basename 06/03/11 0400 06/02/11 0315 06/01/11 1447  WBC 6.9 5.2 7.4  HGB 11.5* 10.8* 12.4  HCT 34.6* 32.3* 36.8  PLT 259 217 258.0   BMET  Basename 06/03/11 0400 06/02/11 0315  NA 135 134*  K 3.9 3.2*  CL 103 104  CO2 24 22  GLUCOSE 114* 86  BUN 4* 10  CREATININE 1.00 0.85  CALCIUM 8.9 8.5   LFT  Basename 06/03/11 0400 06/01/11 1447  PROT 6.9 --  ALBUMIN 2.6* --  AST 99* --  ALT 90* --  ALKPHOS 219* --  BILITOT 1.9* --  BILIDIR -- 2.1*  IBILI -- --   PT/INR  Basename 06/01/11 1447  LABPROT 12.8*  INR 1.2*     Assessment / Plan:  66 yo female stable  s/p ERCP sphincterotomy and stone/sludge extraction yesterday Will discharge home today-plan follow up liver tests later this week, and follow up with Dr. Olevia Perches in 2 weeks. Active Problems:  ANEMIA  Obstruction of bile duct  Choledocholithiasis with obstruction  Nonspecific elevation of levels of transaminase or  lactic acid dehydrogenase (LDH)  Hypokalemia     LOS: 2 days   Dura Mccormack  06/03/2011, 9:51 AM

## 2011-06-04 ENCOUNTER — Encounter: Payer: Self-pay | Admitting: *Deleted

## 2011-06-04 ENCOUNTER — Encounter (HOSPITAL_COMMUNITY): Payer: Self-pay | Admitting: Internal Medicine

## 2011-06-04 ENCOUNTER — Other Ambulatory Visit: Payer: Self-pay | Admitting: *Deleted

## 2011-06-04 DIAGNOSIS — K8050 Calculus of bile duct without cholangitis or cholecystitis without obstruction: Secondary | ICD-10-CM

## 2011-06-04 NOTE — Progress Notes (Signed)
Agree with Ms. Esterwood's assessment and plan. Carl E. Gessner, MD, FACG   

## 2011-06-04 NOTE — Discharge Summary (Signed)
Agree with Ms. Esterwood's assessment and plan. Mekenna Finau E. Tranice Laduke, MD, FACG   

## 2011-06-12 ENCOUNTER — Encounter: Payer: Self-pay | Admitting: *Deleted

## 2011-06-22 ENCOUNTER — Other Ambulatory Visit (INDEPENDENT_AMBULATORY_CARE_PROVIDER_SITE_OTHER): Payer: Medicare Other

## 2011-06-22 ENCOUNTER — Ambulatory Visit (INDEPENDENT_AMBULATORY_CARE_PROVIDER_SITE_OTHER): Payer: Medicare Other | Admitting: Internal Medicine

## 2011-06-22 ENCOUNTER — Encounter: Payer: Self-pay | Admitting: Internal Medicine

## 2011-06-22 VITALS — BP 132/76 | HR 68 | Ht 63.0 in | Wt 162.0 lb

## 2011-06-22 DIAGNOSIS — K8050 Calculus of bile duct without cholangitis or cholecystitis without obstruction: Secondary | ICD-10-CM

## 2011-06-22 DIAGNOSIS — R7989 Other specified abnormal findings of blood chemistry: Secondary | ICD-10-CM

## 2011-06-22 LAB — HEPATIC FUNCTION PANEL
Albumin: 3.7 g/dL (ref 3.5–5.2)
Alkaline Phosphatase: 124 U/L — ABNORMAL HIGH (ref 39–117)
Bilirubin, Direct: 0.2 mg/dL (ref 0.0–0.3)

## 2011-06-22 NOTE — Progress Notes (Signed)
Diane Freeman 10-29-1945 MRN [id number]   History of Present Illness:  This is a 66 year old white female who is post hospitalization for common bile duct stones. She underwent an ERCP with sphincterotomy and removal of multiple stones and sludge from the common bile duct by Dr. Carlean Purl on  05/29/2011. She had elevation of liver function tests. Patient has a history of a laparoscopic cholecystectomy in June 2009. She was diagnosed with Crohn's colitis on her last colonoscopy in June 2009. Since her discharge from the hospital, she has done very well. She denies abdominal pain, dyspepsia or jaundice. There has been no fever.   Past Medical History  Diagnosis Date  . Colitis   . GERD (gastroesophageal reflux disease)   . Anemia   . Hiatal hernia   . Adrenal nodule   . Diverticulosis   . Hx of gastritis   . History of esophageal stricture   . Diabetes mellitus     pt denies  . Choledocholithiasis with obstruction    Past Surgical History  Procedure Date  . Cholecystectomy 2009  . Tubal ligation   . Cataract extraction   . Ercp 06/02/2011    Procedure: ENDOSCOPIC RETROGRADE CHOLANGIOPANCREATOGRAPHY (ERCP);  Surgeon: Lafayette Dragon, MD;  Location: [facility];  Service: Endoscopy;  Laterality: N/A;  dottie /ja  . Ercp 06/02/2011    Procedure: ENDOSCOPIC RETROGRADE CHOLANGIOPANCREATOGRAPHY (ERCP);  Surgeon: Gatha Mayer, MD;  Location: [facility];  Service: Endoscopy;  Laterality: N/A;    reports that she has never smoked. She has never used smokeless tobacco. She reports that she does not drink alcohol or use illicit drugs. family history includes Bone cancer in her maternal grandmother; Breast cancer in her sister; Colon cancer in her maternal aunt; and Stomach cancer in her sister. No Known Allergies      Review of Systems:  The remainder of the 10 point ROS is negative except as outlined in H&P   Physical Exam: General appearance  Well developed, in no distress. Eyes-  non icteric. HEENT nontraumatic, normocephalic. Mouth no lesions, tongue papillated, no cheilosis. Neck supple without adenopathy, thyroid not enlarged, no carotid bruits, no JVD. Lungs Clear to auscultation bilaterally. Cor normal S1, normal S2, regular rhythm, no murmur,  quiet precordium. Abdomen: Mildly protuberant. Soft. Nontender. Liver edge at costal margin. Normoactive bowel sounds. Rectal: Not done. Extremities no pedal edema. Skin no lesions. Not icteric Neurological alert and oriented x 3. Psychological normal mood and affect.  Assessment and Plan:  Problem #1 Patient is doing well post ERCP with sphincterotomy and removal of multiple common bile duct stones and sludge. She had a prior cholecystectomy in 2006. We will recheck her liver function tests today. I have discussed the possibility of using Urso 250 mg twice a day. We will hold off pending results of her liver function tests.   06/22/2011 Delfin Edis

## 2011-06-22 NOTE — Patient Instructions (Addendum)
Your physician has requested that you go to the basement for the following lab work before leaving today: Hepatic Function Please take stool softeners as directed by Dr Olevia Perches. CC: Dr Hulan Saas

## 2011-07-09 ENCOUNTER — Telehealth: Payer: Self-pay | Admitting: *Deleted

## 2011-07-09 NOTE — Telephone Encounter (Signed)
Unable to reach patient will try again later.

## 2011-07-09 NOTE — Telephone Encounter (Signed)
Message copied by Hulan Saas on Mon Jul 09, 2011  2:53 PM ------      Message from: Hulan Saas      Created: Mon Jun 25, 2011  9:04 AM       Call patient and schedule 3 mont OV and repeat LFT's (due 09/24/11)

## 2011-07-09 NOTE — Telephone Encounter (Signed)
Message copied by Hulan Saas on Mon Jul 09, 2011  2:51 PM ------      Message from: Hulan Saas      Created: Mon Jun 25, 2011  9:04 AM       Call patient and schedule 3 mont OV and repeat LFT's (due 09/24/11)

## 2011-07-10 ENCOUNTER — Encounter: Payer: Self-pay | Admitting: *Deleted

## 2011-07-10 NOTE — Telephone Encounter (Signed)
Unable to reach patient.

## 2011-07-10 NOTE — Telephone Encounter (Signed)
Letter mailed to patient with appointment date.

## 2011-09-19 ENCOUNTER — Other Ambulatory Visit (INDEPENDENT_AMBULATORY_CARE_PROVIDER_SITE_OTHER): Payer: Medicare Other

## 2011-09-19 DIAGNOSIS — R7989 Other specified abnormal findings of blood chemistry: Secondary | ICD-10-CM

## 2011-09-19 LAB — HEPATIC FUNCTION PANEL
AST: 24 U/L (ref 0–37)
Albumin: 3.6 g/dL (ref 3.5–5.2)
Alkaline Phosphatase: 97 U/L (ref 39–117)
Bilirubin, Direct: 0.1 mg/dL (ref 0.0–0.3)

## 2011-09-21 ENCOUNTER — Ambulatory Visit (INDEPENDENT_AMBULATORY_CARE_PROVIDER_SITE_OTHER): Payer: Medicare Other | Admitting: Internal Medicine

## 2011-09-21 ENCOUNTER — Encounter: Payer: Self-pay | Admitting: Internal Medicine

## 2011-09-21 VITALS — BP 132/74 | HR 88 | Ht 63.0 in | Wt 165.0 lb

## 2011-09-21 DIAGNOSIS — K8050 Calculus of bile duct without cholangitis or cholecystitis without obstruction: Secondary | ICD-10-CM | POA: Diagnosis not present

## 2011-09-21 MED ORDER — OMEPRAZOLE 20 MG PO CPDR: 20.0000 mg | DELAYED_RELEASE_CAPSULE | ORAL | Status: DC

## 2011-09-21 NOTE — Progress Notes (Signed)
Diane Freeman 1946-02-17 MRN [id number]    History of Present Illness:  This is a 66 year old white female who is post hospitalization for retained common bile duct stones in Feb 2013. She underwent an ERCP and sphincterotomy on 06/02/2011 with removal of multiple stones. She had a remote laparoscopic cholecystectomy in 2006. There was a history of Crohn's colitis in June 2009. She had abnormal liver function tests which have now normalized. Her last set of liver tests on 09/19/2011 were normal. She denies any abdominal pain, dyspepsia, fever or jaundice.   Past Medical History  Diagnosis Date  . Colitis   . GERD (gastroesophageal reflux disease)   . Anemia   . Hiatal hernia   . Adrenal nodule   . Diverticulosis   . Hx of gastritis   . History of esophageal stricture   . Diabetes mellitus     pt denies  . Choledocholithiasis with obstruction    Past Surgical History  Procedure Date  . Cholecystectomy 2009  . Tubal ligation   . Cataract extraction   . Ercp 06/02/2011    Procedure: ENDOSCOPIC RETROGRADE CHOLANGIOPANCREATOGRAPHY (ERCP);  Surgeon: Diane Dragon, MD;  Location: [facility];  Service: Endoscopy;  Laterality: N/A;  dottie /ja  . Ercp 06/02/2011    Procedure: ENDOSCOPIC RETROGRADE CHOLANGIOPANCREATOGRAPHY (ERCP);  Surgeon: Diane Mayer, MD;  Location: [facility];  Service: Endoscopy;  Laterality: N/A;    reports that she has never smoked. She has never used smokeless tobacco. She reports that she does not drink alcohol or use illicit drugs. family history includes Bone cancer in her maternal grandmother; Breast cancer in her sister; Colon cancer in her maternal aunt; and Stomach cancer in her sister. No Known Allergies      Review of Systems: Gastroesophageal reflux controlled on Prilosec  The remainder of the 10 point ROS is negative except as outlined in H&P   Physical Exam: General appearance  Well developed, in no distress. Eyes- non icteric. HEENT  nontraumatic, normocephalic. Mouth no lesions, tongue papillated, no cheilosis. Neck supple without adenopathy, thyroid not enlarged, no carotid bruits, no JVD. Lungs Clear to auscultation bilaterally. Cor normal S1, normal S2, regular rhythm, no murmur,  quiet precordium. Abdomen: Soft nontender with normoactive bowel sounds. No distention. Liver edge at costal margin. Rectal: Not done. Extremities no pedal edema. Skin no lesions. Neurological alert and oriented x 3. Psychological normal mood and affect.  Assessment and Plan:  Problem #1 Complete normalization of liver function tests. Patient is status post endoscopic sphincterotomy and removal of retained common bile duct stones in February 2013. She is doing well. No further followup is necessary. She may return to her full activities.  Problem #2 History of Crohn's colitis. She will be due for a repeat colonoscopy in June 2014.   09/21/2011 Diane Freeman

## 2011-09-21 NOTE — Patient Instructions (Addendum)
We have sent the following medications to your pharmacy for you to pick up at your convenience: Prilosec CC: Dr Hulan Saas

## 2012-02-05 ENCOUNTER — Ambulatory Visit (INDEPENDENT_AMBULATORY_CARE_PROVIDER_SITE_OTHER): Payer: Medicare Other | Admitting: Family Medicine

## 2012-02-05 VITALS — BP 157/88 | HR 76 | Ht 63.0 in | Wt 164.5 lb

## 2012-02-05 DIAGNOSIS — K625 Hemorrhage of anus and rectum: Secondary | ICD-10-CM | POA: Diagnosis not present

## 2012-02-05 DIAGNOSIS — Z0000 Encounter for general adult medical examination without abnormal findings: Secondary | ICD-10-CM

## 2012-02-05 DIAGNOSIS — K644 Residual hemorrhoidal skin tags: Secondary | ICD-10-CM | POA: Diagnosis not present

## 2012-02-05 LAB — CBC
HCT: 38.7 % (ref 36.0–46.0)
MCH: 29.3 pg (ref 26.0–34.0)
MCHC: 34.6 g/dL (ref 30.0–36.0)
RDW: 13.9 % (ref 11.5–15.5)

## 2012-02-05 MED ORDER — HYDROCORTISONE 2.5 % RE CREA: TOPICAL_CREAM | Freq: Two times a day (BID) | RECTAL | Status: DC

## 2012-02-05 NOTE — Progress Notes (Signed)
  Subjective:    Patient ID: Diane Freeman, female    DOB: 1946/01/29, 66 y.o.   MRN: [id number]  HPI Diane Freeman is a 66 yo female who presents to meet the new doctor and for rectal bleeding.  Rectal bleeding: over the last 2 weeks has noticed some blood in her stool.  She describes is as pink tinge to toilet water. Endorses mild pain with defecation in anal area. Does not endorse any dark stools or blood on her stools.  Denies light headedness, palpitations, shortness of breath, chest pain, and fatigue. Notably she has history of anemia related to diverticulitis in which her Hb dropped to 4. States she wanted to come in and get checked out prior to getting to that point again. Currently takes daily iron supplement.  Health maintenance: has not had mammogram since 07/14/09.    Review of Systems negative except per HPI     Objective:   Physical Exam  Constitutional: She appears well-developed.  HENT:  Head: Normocephalic and atraumatic.  Eyes: Conjunctivae normal are normal.  Cardiovascular: Normal rate, regular rhythm and normal heart sounds.   Pulmonary/Chest: Effort normal and breath sounds normal.  Abdominal: Soft. She exhibits no distension. There is tenderness (minimal tenderness in LLQ at inital palpation, non-tender at second palpation). There is no rebound and no guarding.  Genitourinary:       Rectal exam: 3 external hemorrhoids visible, one of which located at 9 o'clock appears to be open and could have been bleeding previously. No frank blood seen on exam.    BP 157/88  Pulse 76  Ht 5' 3"  (1.6 m)  Wt 164 lb 8 oz (74.617 kg)  BMI 29.14 kg/m2       Assessment & Plan:

## 2012-02-05 NOTE — Patient Instructions (Addendum)
Nice to meet you today. You look stable from a bleeding standpoint, it appears you have a couple of hemorrhoids.  We will give you a prescription for a steroid cream that should be applied to your hemorrhoids daily.  We would like you to follow up with your GI doctor given your recent rectal bleeding. We will get a CBC today to see where your blood count stands. Please give [place] a call if you continue to have bleeding, the bleeding increases, you develop fatigue, shortness of breath, or chest pain.  We will also refer you for a mammogram. Thanks for your visit today.

## 2012-02-05 NOTE — Assessment & Plan Note (Signed)
Patient due for mammogram.  She is to call the breast center to set this up.

## 2012-02-05 NOTE — Assessment & Plan Note (Signed)
Patient with history of bleeding from diverticulitis with Hb of 4 at that time.  Appears that current bleeding event could be related to external hemorrhoids, though can not rule out other causes such as diverticulitis, AVM, cancer without further imaging. Plan: CBC drawn today and will follow-up on this.  Asked that patient follow-up with her GI physician given her history of rectal bleeding.

## 2012-02-05 NOTE — Assessment & Plan Note (Signed)
Patient with external hemorrhoids evident on exam. Plan: treat with anusol cream BID. Patient to follow-up as needed if symptoms don't improve.  Advised that she should seek medical care if bleeding increases, she develops fatigue, shortness of breath, or chest pain.

## 2012-02-13 ENCOUNTER — Other Ambulatory Visit: Payer: Self-pay | Admitting: Family Medicine

## 2012-02-13 DIAGNOSIS — Z1231 Encounter for screening mammogram for malignant neoplasm of breast: Secondary | ICD-10-CM

## 2012-03-21 ENCOUNTER — Ambulatory Visit
Admission: RE | Admit: 2012-03-21 | Discharge: 2012-03-21 | Disposition: A | Discharge status: Home / Self Care | Payer: Medicare Other | Source: Ambulatory Visit | Attending: Family Medicine | Admitting: Family Medicine

## 2012-03-21 DIAGNOSIS — Z1231 Encounter for screening mammogram for malignant neoplasm of breast: Secondary | ICD-10-CM | POA: Diagnosis not present

## 2012-07-08 ENCOUNTER — Encounter: Payer: Self-pay | Admitting: Internal Medicine

## 2012-07-08 ENCOUNTER — Ambulatory Visit (INDEPENDENT_AMBULATORY_CARE_PROVIDER_SITE_OTHER): Payer: Medicare Other | Admitting: Internal Medicine

## 2012-07-08 ENCOUNTER — Telehealth: Payer: Self-pay | Admitting: Internal Medicine

## 2012-07-08 ENCOUNTER — Other Ambulatory Visit (INDEPENDENT_AMBULATORY_CARE_PROVIDER_SITE_OTHER): Payer: Medicare Other

## 2012-07-08 VITALS — BP 148/86 | HR 78 | Ht 63.0 in | Wt 160.0 lb

## 2012-07-08 DIAGNOSIS — R197 Diarrhea, unspecified: Secondary | ICD-10-CM

## 2012-07-08 DIAGNOSIS — K5090 Crohn's disease, unspecified, without complications: Secondary | ICD-10-CM

## 2012-07-08 DIAGNOSIS — K5010 Crohn's disease of large intestine without complications: Secondary | ICD-10-CM

## 2012-07-08 LAB — COMPREHENSIVE METABOLIC PANEL
AST: 30 U/L (ref 0–37)
Albumin: 3.5 g/dL (ref 3.5–5.2)
Alkaline Phosphatase: 105 U/L (ref 39–117)
Potassium: 4.3 mEq/L (ref 3.5–5.1)
Sodium: 137 mEq/L (ref 135–145)
Total Bilirubin: 0.4 mg/dL (ref 0.3–1.2)
Total Protein: 8.2 g/dL (ref 6.0–8.3)

## 2012-07-08 LAB — CBC WITH DIFFERENTIAL/PLATELET
Basophils Absolute: 0.1 10*3/uL (ref 0.0–0.1)
Basophils Relative: 0.8 % (ref 0.0–3.0)
Eosinophils Absolute: 0.1 10*3/uL (ref 0.0–0.7)
Lymphocytes Relative: 21.3 % (ref 12.0–46.0)
MCHC: 33.9 g/dL (ref 30.0–36.0)
Monocytes Absolute: 0.6 10*3/uL (ref 0.1–1.0)
Neutrophils Relative %: 68.8 % (ref 43.0–77.0)
Platelets: 288 10*3/uL (ref 150.0–400.0)
RBC: 4.81 Mil/uL (ref 3.87–5.11)
RDW: 15.6 % — ABNORMAL HIGH (ref 11.5–14.6)

## 2012-07-08 LAB — SEDIMENTATION RATE: Sed Rate: 35 mm/hr — ABNORMAL HIGH (ref 0–22)

## 2012-07-08 MED ORDER — HYDROCORTISONE ACE-PRAMOXINE 2.5-1 % RE CREA: TOPICAL_CREAM | Freq: Three times a day (TID) | RECTAL | Status: DC

## 2012-07-08 NOTE — Patient Instructions (Addendum)
You have been scheduled for a colonoscopy with propofol. Please follow written instructions given to you at your visit today.  We have given you a prep kit. If you use inhalers (even only as needed), please bring them with you on the day of your procedure.  We have sent the following medications to your pharmacy for you to pick up at your convenience: [facility] has requested that you go to the basement for the following lab work before leaving today: CBC, Sed Rate, CMET, TSH  CC: Dr [name], [facility]

## 2012-07-08 NOTE — Progress Notes (Signed)
Diane Freeman 1946-02-20 MRN [id number]   History of Present Illness:  This is a 67 year old white female reporting  2 weeks of diarrhea. There is blood mixed with stool and crampy lower abdominal pain. There has been no weight loss, nausea, vomiting or fever. Patient denies traveling long distances. She has a history of Crohn's disease which was diagnosed about 6 years ago at a local hopital on the [facility] and later confirmed after transfer to [facility]. I did a colonoscopy in 2009 which did not show any evidence of  active colitis. She had a laparoscopic cholecystectomy in 2006 and had a common bile duct stone in February 2013 which was removed by ERCP. She denies taking any antibiotics recently. She reports that the previous attack of Crohn's colitis presented the same way. She has already had 5 loose stools today.   Past Medical History  Diagnosis Date  . Colitis   . GERD (gastroesophageal reflux disease)   . Anemia   . Hiatal hernia   . Adrenal nodule   . Diverticulosis   . Hx of gastritis   . History of esophageal stricture   . Diabetes mellitus     pt denies  . Choledocholithiasis with obstruction    Past Surgical History  Procedure Laterality Date  . Cholecystectomy  2009  . Tubal ligation    . Cataract extraction    . Ercp  06/02/2011    Procedure: ENDOSCOPIC RETROGRADE CHOLANGIOPANCREATOGRAPHY (ERCP);  Surgeon: Lafayette Dragon, MD;  Location: [facility];  Service: Endoscopy;  Laterality: N/A;  dottie /ja  . Ercp  06/02/2011    Procedure: ENDOSCOPIC RETROGRADE CHOLANGIOPANCREATOGRAPHY (ERCP);  Surgeon: Gatha Mayer, MD;  Location: [facility];  Service: Endoscopy;  Laterality: N/A;    reports that she has never smoked. She has never used smokeless tobacco. She reports that she does not drink alcohol or use illicit drugs. family history includes Bone cancer in her maternal grandmother; Breast cancer in her sister; Colon cancer (age of onset: 8) in her maternal aunt; and  Stomach cancer in her sister. No Known Allergies      Review of Systems: Denies nausea vomiting weight loss  The remainder of the 10 point ROS is negative except as outlined in H&P   Physical Exam: General appearance  Well developed, in no distress. Eyes- non icteric. HEENT nontraumatic, normocephalic. Mouth no lesions, tongue papillated, no cheilosis. Neck supple without adenopathy, thyroid not enlarged, no carotid bruits, no JVD. Lungs Clear to auscultation bilaterally. Cor normal S1, normal S2, regular rhythm, no murmur,  quiet precordium. Abdomen: Diffusely tender abdomen mostly in the left lower quadrant and in the periumbilical area. No distention or tympany. Normoactive bowel sounds. External hemorrhoids. Tender digital exam with soft strongly Hemoccult-positive stool. Rectal: strongly heme positive stool Extremities no pedal edema. Skin no lesions. Neurological alert and oriented x 3. Psychological normal mood and affect.  Assessment and Plan:  Problem #1 Recurrent diarrhea  suggestive of recurrent Crohn's disease. Her last flareup was about 6 years ago. She has been on no medications. She is due for a recall colonoscopy. We will schedule the exam for tomorrow and make further decisions about treatment and diagnosis based on the endoscopic findings. We will obtain a blood count, sedimentation rate, TSH and metabolic panel today. We will also send Analpram cream 2.5% for her to use when necessary for rectal irritation.  Problem #2 History of a retained common bile duct stone in February 2013. Patient is status post  ERCP, sphincterotomy and bile duct stone extraction. She is status post normalization of liver function tests. Patient currently has no symptoms.    07/08/2012 Delfin Edis

## 2012-07-08 NOTE — Telephone Encounter (Signed)
Patient has had diarrhea x 2 weeks. Watery diarrhea sometimes has blood in it. States she has had 3 diarrhea stools today already. Scheduled patient today at 2:15 Pm with Dr. Olevia Perches.

## 2012-07-09 ENCOUNTER — Other Ambulatory Visit: Payer: Self-pay | Admitting: *Deleted

## 2012-07-09 ENCOUNTER — Telehealth: Payer: Self-pay | Admitting: Internal Medicine

## 2012-07-09 ENCOUNTER — Other Ambulatory Visit (INDEPENDENT_AMBULATORY_CARE_PROVIDER_SITE_OTHER): Payer: Medicare Other

## 2012-07-09 ENCOUNTER — Encounter: Payer: Self-pay | Admitting: Internal Medicine

## 2012-07-09 ENCOUNTER — Ambulatory Visit (AMBULATORY_SURGERY_CENTER): Payer: Medicare Other | Admitting: Internal Medicine

## 2012-07-09 VITALS — BP 139/73 | HR 69 | Temp 98.1°F | Resp 25 | Ht 63.0 in | Wt 161.0 lb

## 2012-07-09 DIAGNOSIS — K5090 Crohn's disease, unspecified, without complications: Secondary | ICD-10-CM

## 2012-07-09 DIAGNOSIS — K625 Hemorrhage of anus and rectum: Secondary | ICD-10-CM | POA: Diagnosis not present

## 2012-07-09 DIAGNOSIS — D649 Anemia, unspecified: Secondary | ICD-10-CM | POA: Diagnosis not present

## 2012-07-09 DIAGNOSIS — K5289 Other specified noninfective gastroenteritis and colitis: Secondary | ICD-10-CM

## 2012-07-09 DIAGNOSIS — I10 Essential (primary) hypertension: Secondary | ICD-10-CM | POA: Diagnosis not present

## 2012-07-09 DIAGNOSIS — D126 Benign neoplasm of colon, unspecified: Secondary | ICD-10-CM | POA: Diagnosis not present

## 2012-07-09 LAB — IBC PANEL
Saturation Ratios: 15.2 % — ABNORMAL LOW (ref 20.0–50.0)
Transferrin: 295.7 mg/dL (ref 212.0–360.0)

## 2012-07-09 MED ORDER — PREDNISONE 10 MG PO TABS: 30.0000 mg | ORAL_TABLET | Freq: Every day | ORAL | Status: DC

## 2012-07-09 MED ORDER — MESALAMINE 400 MG PO TBEC: 1200.0000 mg | DELAYED_RELEASE_TABLET | Freq: Three times a day (TID) | ORAL | Status: DC

## 2012-07-09 MED ORDER — SODIUM CHLORIDE 0.9 % IV SOLN: 500.0000 mL | INTRAVENOUS | Status: DC

## 2012-07-09 NOTE — Telephone Encounter (Signed)
Per Ray @ [facility], prior authorization phone number to call is [phone].

## 2012-07-09 NOTE — Progress Notes (Signed)
The pt has a poor prep.  She had a large bowel movement in the bed when she arrived in the recovery room.  I cleaned her up and changed all the bed linens and her gown.  Once the pt had been on the monitor for 30 minutes, I unhooked her and removed her IV and she went to the restroom.  She said she had large bm in the toilet also.  Pt was given a diper to wear home. Maw

## 2012-07-09 NOTE — Op Note (Signed)
[place]  [address] [place], [id number]   COLONOSCOPY PROCEDURE REPORT  PATIENT: Diane, Freeman  MR#: [id number] BIRTHDATE: 05-18-1945 , 67  yrs. old GENDER: Female ENDOSCOPIST: Lafayette Dragon, MD REFERRED BY:  [facility] PROCEDURE DATE:  07/09/2012 PROCEDURE:   Colonoscopy with biopsy ASA CLASS:   Class II INDICATIONS:Rectal Bleeding and hx of left sided Crohn's colitis 2007 and 2009 colonoscopy, recent flare up off medications. MEDICATIONS: MAC sedation, administered by CRNA and propofol (Diprivan) 363m IV  DESCRIPTION OF PROCEDURE:   After the risks and benefits and of the procedure were explained, informed consent was obtained.  A digital rectal exam revealed no abnormalities of the rectum.    The LB [id number]-[id number] endoscope was introduced through the anus and advanced to the ascending colon .  The quality of the prep was poor, using MoviPrep .  The instrument was then slowly withdrawn as the colon was fully examined.   The colon was only suboptimally visualized due to poor prep. There was liquid and solid stool throughout the left and the right colon. For that reason cecal pouch was never visualized. There was active colitis with bleeding and  friability from 0-30 cm from the rectum . Random   biopsies were obtained. There were scattered diverticuli. in the left colon Mucosa of the splenic flexure transverse, hepatic flexure appeared normal biopsies were taken randomly. Mucosa in the descending colon also appeared normal and multiple biopsies were obtained. Due to the poor prep I could not appreciate any pseudopolyps., these were mentioned on prior exam in 2009. There was no friability or bleeding except in the rectosigmoid[  fro 0-30 cm.       The scope was then withdrawn from the patient and the procedure completed.  COMPLICATIONS: There were no complications. ENDOSCOPIC IMPRESSION:  active left-sided colitis  consistent with recurrent Crohn's colitis. Status post random biopsies from the rectosigmoid, descending colon and from the right colon Limited exam due to poor prep. Cecal pouch never visualized adequately Moderate diverticulosis of the left colon The bleeding is originated in the left colon due to active inflammatory bowel disease.   RECOMMENDATIONS: low-residue diet Mesalamine  3.6 g daily Prednisone taper starting at 30 mg tapering by 5 mg every week Office visit in 4 weeks Oral iron supplements Check B12 levels and iron saturation  REPEAT EXAM: 5 years  cc:  _______________________________ eSigned:Lafayette Dragon MD 07/09/2012 12:35 PM     PATIENT NAME:  Diane Freeman. MR#: [id number]

## 2012-07-09 NOTE — Patient Instructions (Addendum)
Follow a low residue diet.  Handout was given on low residue diet and crohn's.  Dr. Olevia Perches [facility] [place] and also printed [facility] for [facility].  Follow up appointment for Dr. Olevia Perches for Monday, Aug 18, 2012 at 10:45 on the 3rd floor.  Add OTC iron and take it daily.  When discharged from recovery to the lab for blood work B12- and iron levels.  You may resume your other current medications today.  Please call if questions or concerns.    YOU HAD AN ENDOSCOPIC PROCEDURE TODAY AT [facility]: Refer to the procedure report that was given to you for any specific questions about what was found during the examination.  If the procedure report does not answer your questions, please call your gastroenterologist to clarify.  If you requested that your care partner not be given the details of your procedure findings, then the procedure report has been included in a sealed envelope for you to review at your convenience later.  YOU SHOULD EXPECT: Some feelings of bloating in the abdomen. Passage of more gas than usual.  Walking can help get rid of the air that was put into your GI tract during the procedure and reduce the bloating. If you had a lower endoscopy (such as a colonoscopy or flexible sigmoidoscopy) you may notice spotting of blood in your stool or on the toilet paper. If you underwent a bowel prep for your procedure, then you may not have a normal bowel movement for a few days.  DIET: Your first meal following the procedure should be a light meal and then it is ok to progress to your normal diet.  A half-sandwich or bowl of soup is an example of a good first meal.  Heavy or fried foods are harder to digest and may make you feel nauseous or bloated.  Likewise meals heavy in dairy and vegetables can cause extra gas to form and this can also increase the bloating.  Drink plenty of fluids but you should avoid alcoholic beverages for 24  hours.  ACTIVITY: Your care partner should take you home directly after the procedure.  You should plan to take it easy, moving slowly for the rest of the day.  You can resume normal activity the day after the procedure however you should NOT DRIVE or use heavy machinery for 24 hours (because of the sedation medicines used during the test).    SYMPTOMS TO REPORT IMMEDIATELY: A gastroenterologist can be reached at any hour.  During normal business hours, 8:30 AM to 5:00 PM Monday through Friday, call [phone].  After hours and on weekends, please call the GI answering service at [phone] who will take a message and have the physician on call contact you.   Following lower endoscopy (colonoscopy or flexible sigmoidoscopy):  Excessive amounts of blood in the stool  Significant tenderness or worsening of abdominal pains  Swelling of the abdomen that is new, acute  Fever of 100F or higher    FOLLOW UP: If any biopsies were taken you will be contacted by phone or by letter within the next 1-3 weeks.  Call your gastroenterologist if you have not heard about the biopsies in 3 weeks.  Our staff will call the home number listed on your records the next business day following your procedure to check on you and address any questions or concerns that you may have at that time regarding the  information given to you following your procedure. This is a courtesy call and so if there is no answer at the home number and we have not heard from you through the emergency physician on call, we will assume that you have returned to your regular daily activities without incident.  SIGNATURES/CONFIDENTIALITY: You and/or your care partner have signed paperwork which will be entered into your electronic medical record.  These signatures attest to the fact that that the information above on your After Visit Summary has been reviewed and is understood.  Full responsibility of the confidentiality of this  discharge information lies with you and/or your care-partner.

## 2012-07-09 NOTE — Progress Notes (Signed)
No complaints noted in the recovery room.  Pt to the lab for blood work before discharge to home.  Maw  Patient did not experience any of the following events: a burn prior to discharge; a fall within the facility; wrong site/side/patient/procedure/implant event; or a hospital transfer or hospital admission upon discharge from the facility. (G8907)Patient did not have preoperative order for IV antibiotic SSI prophylaxis. [phone])

## 2012-07-10 ENCOUNTER — Encounter: Payer: Self-pay | Admitting: *Deleted

## 2012-07-10 ENCOUNTER — Telehealth: Payer: Self-pay

## 2012-07-10 NOTE — Telephone Encounter (Signed)
Pharmacy has now sent a fax with prior authorization request showing a completely different number to contact insurance. That number is [phone] [facility]). Per Medicaid, patient does not have a plan with them, only medicare. Patient's medicare ID # is [id number] (per Sharyn Lull @ patient's pharmacy). Once I contacted medicare, I was given a number for prior auth department of [id number]. I have spoken to a prior auth representative and have advised that patient has crohn's colitis (555.1) and has previously tried sulfasalazine and prednisone. Per representative, patient's Delzicol 400 3 tabs three times daily is unable to be authorized by her because it is over the quantity limit of 3 tablets twice daily. However, a review will be made by a pharmacist. I have inquired as to what other medications may be approved and I have been told that there are no specific medication recommendations. Will await response from pharmacist review. Ms Vasbinder has been advised of the above information.

## 2012-07-10 NOTE — Telephone Encounter (Signed)
  Follow up Call-  Call back number 07/09/2012  Post procedure Call Back phone  # [phone] hm  Permission to leave phone message Yes     Patient questions:  Do you have a fever, pain , or abdominal swelling? no Pain Score  0 *  Have you tolerated food without any problems? yes  Have you been able to return to your normal activities? yes  Do you have any questions about your discharge instructions: Diet   no Medications  no Follow up visit  no  Do you have questions or concerns about your Care? no  Actions: * If pain score is 4 or above: No action needed, pain <4.

## 2012-07-11 DIAGNOSIS — K5010 Crohn's disease of large intestine without complications: Secondary | ICD-10-CM

## 2012-07-11 HISTORY — DX: Crohn's disease of large intestine without complications: K50.10

## 2012-07-14 NOTE — Telephone Encounter (Signed)
Please send Delzicol 451m, 3 po bid.,#[phone], 3 refills.

## 2012-07-14 NOTE — Telephone Encounter (Signed)
Dr Olevia Perches, patient's insurance [employer] [facility]) has denied coverage for Delzicol as the prescribed does is for 3 tablets three times daily. The max that they will cover is 3 tablets twice daily. There is a provider number to call to speak to a clinical reviewer [phone]) and there is a note that states that "coverage of Delzicol 400 mg in an excess of 6 capsules per day will be considered medically necessary if the [facility] physician or other prescriber provides documentation of the following criteria: The member's physician or other prescriber provides a statement of medical necessity documenting the number of doses that are available under the dosing restrictions for the drug have been or are likely to be ineffective or adversely affect the drugs effectiveness or the member's compliance." I am unable to find documenation of lower doses of Delzicol previously failed. However, I will put a note on your desk from [place] if you will fill out the highlighted portion, I will attempt appeal of the denial.

## 2012-07-15 ENCOUNTER — Encounter: Payer: Self-pay | Admitting: Internal Medicine

## 2012-07-15 NOTE — Telephone Encounter (Signed)
After speaking with Dr Olevia Perches, she prefers to try appeal for Delzicol 400 mg 3 tablets three times daily. As a last resort, we will send Delzicol 3 tablets twice daily. An appeal form has been sent to [employer] and we are awaiting response.

## 2012-07-17 NOTE — Telephone Encounter (Addendum)
Simona from [employer] has called and patient's Delzicol 3 tablets three times daily has been approved until the end of the plan year. Approval number [id number]. Pharmacy advised and patient advised.

## 2012-08-02 ENCOUNTER — Other Ambulatory Visit: Payer: Self-pay | Admitting: Internal Medicine

## 2012-08-04 ENCOUNTER — Encounter: Payer: Self-pay | Admitting: *Deleted

## 2012-08-18 ENCOUNTER — Encounter: Payer: Self-pay | Admitting: Internal Medicine

## 2012-08-18 ENCOUNTER — Ambulatory Visit (INDEPENDENT_AMBULATORY_CARE_PROVIDER_SITE_OTHER): Payer: Medicare Other | Admitting: Internal Medicine

## 2012-08-18 VITALS — BP 116/76 | HR 96 | Ht 62.25 in | Wt 158.2 lb

## 2012-08-18 DIAGNOSIS — K5010 Crohn's disease of large intestine without complications: Secondary | ICD-10-CM | POA: Diagnosis not present

## 2012-08-18 NOTE — Patient Instructions (Addendum)
Please follow up with Dr. Olevia Perches in 3 months [facility]

## 2012-08-18 NOTE — Progress Notes (Signed)
Diane Freeman 1946/03/11 MRN [id number]        History of Present Illness:  This is a 67 year old white female post colonoscopy on 07/09/2012 which showed  active colitis involving sigmoid colon. She has a history of Crohn's colitis on prior colonoscopy in 2007 and 2009. She was in remission for several years but has recently developed diarrhea and bleeding. Biopsies from the right and transverse colon showed normal mucosa. Biopsies from 30-50 cm showed minimally active colitis consistent with Crohn's disease.Marland Kitchen Biopsies from 0-30 cm showed chronic minimally active colitis consistent with Crohn's disease. She has responded to prednisone taper which started at 30 mg daily. She is currently at 15 mg daily and we continue to taper 5 mg  every 2 weeks. She is also on mesalamine is 3.6 g daily in 3 divided doses.(  Delzicol). Her iron saturation was low at 15% her B12 level was normal at 404. She has been on iron supplements. She has no complaints today. Her bowel habits are regular. She denies abdominal pain   Past Medical History  Diagnosis Date  . Colitis   . GERD (gastroesophageal reflux disease)   . Anemia   . Adrenal nodule   . Diverticulosis   . Hx of gastritis   . History of esophageal stricture   . Choledocholithiasis with obstruction   . Diabetes mellitus     pt denies  . Hiatal hernia   . Blood transfusion without reported diagnosis   . Cataract     bil removed  . Crohn's colitis 07/11/12   Past Surgical History  Procedure Laterality Date  . Cholecystectomy  2009  . Tubal ligation    . Cataract extraction    . Ercp  06/02/2011    Procedure: ENDOSCOPIC RETROGRADE CHOLANGIOPANCREATOGRAPHY (ERCP);  Surgeon: Lafayette Dragon, MD;  Location: [facility];  Service: Endoscopy;  Laterality: N/A;  dottie /ja  . Ercp  06/02/2011    Procedure: ENDOSCOPIC RETROGRADE CHOLANGIOPANCREATOGRAPHY (ERCP);  Surgeon: Gatha Mayer, MD;  Location: [facility];  Service: Endoscopy;  Laterality:  N/A;  . Colonoscopy    . Upper gastrointestinal endoscopy      reports that she has never smoked. She has never used smokeless tobacco. She reports that she does not drink alcohol or use illicit drugs. family history includes Bone cancer in her maternal grandmother; Breast cancer in her sister; Colon cancer (age of onset: 13) in her maternal aunt; and Stomach cancer (age of onset: 20) in her sister.  There is no history of Esophageal cancer and Rectal cancer. No Known Allergies      Review of Systems: Denies dysphagia chest pain abdominal pain  The remainder of the 10 point ROS is negative except as outlined in H&P   Physical Exam: General appearance  Well developed, in no distress. Eyes- non icteric. HEENT nontraumatic, normocephalic. Mouth no lesions, tongue papillated, no cheilosis. Neck supple without adenopathy, thyroid not enlarged, no carotid bruits, no JVD. Lungs Clear to auscultation bilaterally. Cor normal S1, normal S2, regular rhythm, no murmur,  quiet precordium. Abdomen: Soft nontender normoactive bowel sounds Rectal: Not done Extremities no pedal edema. Skin no lesions. Neurological alert and oriented x 3. Psychological normal mood and affect.  Assessment and Plan:  67 year old white female with left-sided Crohn's disease confirmed on colonoscopy and biopsies from the left colon consistent with chronic mildly active colitis. She is currently in symptomatic remission on a prednisone taper which she will continue until she stops prednisone 6 weeks from now.  She will continue on mesalamine and I will see her in 3 months. We will plan to repeat her CBC and her iron study at that point   08/18/2012 Delfin Edis

## 2012-11-18 ENCOUNTER — Ambulatory Visit (INDEPENDENT_AMBULATORY_CARE_PROVIDER_SITE_OTHER): Payer: Medicare Other | Admitting: Internal Medicine

## 2012-11-18 ENCOUNTER — Encounter: Payer: Self-pay | Admitting: Internal Medicine

## 2012-11-18 ENCOUNTER — Other Ambulatory Visit (INDEPENDENT_AMBULATORY_CARE_PROVIDER_SITE_OTHER): Payer: Medicare Other

## 2012-11-18 VITALS — BP 110/72 | HR 68 | Ht 62.25 in | Wt 157.2 lb

## 2012-11-18 DIAGNOSIS — R799 Abnormal finding of blood chemistry, unspecified: Secondary | ICD-10-CM

## 2012-11-18 DIAGNOSIS — R7989 Other specified abnormal findings of blood chemistry: Secondary | ICD-10-CM

## 2012-11-18 DIAGNOSIS — K5289 Other specified noninfective gastroenteritis and colitis: Secondary | ICD-10-CM | POA: Diagnosis not present

## 2012-11-18 DIAGNOSIS — K5150 Left sided colitis without complications: Secondary | ICD-10-CM

## 2012-11-18 DIAGNOSIS — K5010 Crohn's disease of large intestine without complications: Secondary | ICD-10-CM

## 2012-11-18 LAB — CBC WITH DIFFERENTIAL/PLATELET
Basophils Relative: 0.5 % (ref 0.0–3.0)
Eosinophils Relative: 1.8 % (ref 0.0–5.0)
HCT: 43.5 % (ref 36.0–46.0)
Hemoglobin: 15.1 g/dL — ABNORMAL HIGH (ref 12.0–15.0)
Lymphs Abs: 2.4 10*3/uL (ref 0.7–4.0)
Monocytes Relative: 8.2 % (ref 3.0–12.0)
Neutro Abs: 6.2 10*3/uL (ref 1.4–7.7)
RDW: 14.1 % (ref 11.5–14.6)
WBC: 9.6 10*3/uL (ref 4.5–10.5)

## 2012-11-18 MED ORDER — SULFASALAZINE 500 MG PO TABS: 500.0000 mg | ORAL_TABLET | Freq: Two times a day (BID) | ORAL | Status: DC

## 2012-11-18 MED ORDER — FOLIC ACID 1 MG PO TABS: 1.0000 mg | ORAL_TABLET | Freq: Every day | ORAL | Status: DC

## 2012-11-18 NOTE — Patient Instructions (Addendum)
We have sent the following medications to your pharmacy for you to pick up at your convenience: Azulfadine Folic Acid  Your physician has requested that you go to the basement for the following lab work before leaving today: CBC, IBC  CC: Dr [name]

## 2012-11-18 NOTE — Progress Notes (Signed)
Diane Freeman 10/21/1945 MRN [id number]   History of Present Illness:  This is a 67 year old white female with known Crohn's colitis since 2007. She had an exacerbation in 2009 and again in April 2014. Her last colonoscopy in April 2014 showed left sided  active colitis and normal mucosa of the transverse and right colon. She responded to a prednisone taper starting at 30 mg day. She was also given Delzicol 400 mg samples which she took for  at least 2 months but ran out of it and has not taken any more. She is currently in remission having one solid bowel movement a day. She denies abdominal pain or rectal bleeding. Her iron was low at 15% and her B12 was normal at 404. There is a history of choledocholithiasis with an obstructed common bile duct in March 2013. She underwent a cholecystectomy at that time. There is also family history of colon cancer in her maternal aunt and stomach cancer in her sister, who  died of breast cancer.   Past Medical History  Diagnosis Date  . Colitis   . GERD (gastroesophageal reflux disease)   . Anemia   . Adrenal nodule   . Diverticulosis   . Hx of gastritis   . History of esophageal stricture   . Choledocholithiasis with obstruction   . Diabetes mellitus     pt denies  . Hiatal hernia   . Blood transfusion without reported diagnosis   . Cataract     bil removed  . Crohn's colitis 07/11/12   Past Surgical History  Procedure Laterality Date  . Cholecystectomy  2009  . Tubal ligation    . Cataract extraction    . Ercp  06/02/2011    Procedure: ENDOSCOPIC RETROGRADE CHOLANGIOPANCREATOGRAPHY (ERCP);  Surgeon: Lafayette Dragon, MD;  Location: [facility];  Service: Endoscopy;  Laterality: N/A;  dottie /ja  . Ercp  06/02/2011    Procedure: ENDOSCOPIC RETROGRADE CHOLANGIOPANCREATOGRAPHY (ERCP);  Surgeon: Gatha Mayer, MD;  Location: [facility];  Service: Endoscopy;  Laterality: N/A;  . Colonoscopy    . Upper gastrointestinal endoscopy      reports that  she has never smoked. She has never used smokeless tobacco. She reports that she does not drink alcohol or use illicit drugs. family history includes Bone cancer in her maternal grandmother; Breast cancer in her sister; Colon cancer (age of onset: 36) in her maternal aunt; Stomach cancer (age of onset: 25) in her sister. There is no history of Esophageal cancer or Rectal cancer. No Known Allergies      Review of Systems: Denies rectal bleeding, diarrhea chest pain nausea or vomiting  The remainder of the 10 point ROS is negative except as outlined in H&P   Physical Exam: General appearance  Well developed, in no distress. Eyes- non icteric. HEENT nontraumatic, normocephalic. Mouth no lesions, tongue papillated, no cheilosis. Neck supple without adenopathy, thyroid not enlarged, no carotid bruits, no JVD. Lungs Clear to auscultation bilaterally. Cor normal S1, normal S2, regular rhythm, no murmur,  quiet precordium. Abdomen: Soft relaxed abdomen with normoactive bowel sounds. No distention. Rectal: Not done. Extremities no pedal edema. Skin no lesions. Neurological alert and oriented x 3. Psychological normal mood and affect.  Assessment and Plan:  Problem #29 67 year old white female with left-sided Crohn's colitis currently under good control without any medications after she completed a prednisone taper. Mesalamine was discontinued because of the cost. I have talked about putting her on a maintenance therapy and will  use sulfasalazine 500 mg 2 tablets daily and folic acid 1 mg daily as a maintenance medication. We will check a CBC and iron studies today.   11/18/2012 Diane Freeman

## 2012-11-23 ENCOUNTER — Other Ambulatory Visit: Payer: Self-pay | Admitting: Internal Medicine

## 2012-11-25 ENCOUNTER — Other Ambulatory Visit: Payer: Self-pay | Admitting: Internal Medicine

## 2013-03-17 ENCOUNTER — Other Ambulatory Visit: Payer: Self-pay | Admitting: Internal Medicine

## 2013-05-13 ENCOUNTER — Encounter: Payer: Self-pay | Admitting: Family Medicine

## 2013-05-13 ENCOUNTER — Ambulatory Visit (INDEPENDENT_AMBULATORY_CARE_PROVIDER_SITE_OTHER): Payer: Medicare Other | Admitting: Family Medicine

## 2013-05-13 VITALS — BP 120/70 | HR 78 | Temp 98.1°F | Ht 62.25 in | Wt 147.0 lb

## 2013-05-13 DIAGNOSIS — R300 Dysuria: Secondary | ICD-10-CM | POA: Diagnosis not present

## 2013-05-13 DIAGNOSIS — N390 Urinary tract infection, site not specified: Secondary | ICD-10-CM

## 2013-05-13 LAB — POCT URINALYSIS DIPSTICK
Bilirubin, UA: NEGATIVE
Glucose, UA: NEGATIVE
Ketones, UA: NEGATIVE
NITRITE UA: POSITIVE
PH UA: 6
PROTEIN UA: NEGATIVE
SPEC GRAV UA: 1.01
UROBILINOGEN UA: 0.2

## 2013-05-13 LAB — POCT UA - MICROSCOPIC ONLY: WBC, Ur, HPF, POC: >20

## 2013-05-13 MED ORDER — CEPHALEXIN 500 MG PO CAPS: 500.0000 mg | ORAL_CAPSULE | Freq: Two times a day (BID) | ORAL | Status: DC

## 2013-05-13 NOTE — Patient Instructions (Signed)
Urinary Tract Infection  Urinary tract infections (UTIs) can develop anywhere along your urinary tract. Your urinary tract is your body's drainage system for removing wastes and extra water. Your urinary tract includes two kidneys, two ureters, a bladder, and a urethra. Your kidneys are a pair of bean-shaped organs. Each kidney is about the size of your fist. They are located below your ribs, one on each side of your spine.  CAUSES  Infections are caused by microbes, which are microscopic organisms, including fungi, viruses, and bacteria. These organisms are so small that they can only be seen through a microscope. Bacteria are the microbes that most commonly cause UTIs.  SYMPTOMS   Symptoms of UTIs may vary by age and gender of the patient and by the location of the infection. Symptoms in young women typically include a frequent and intense urge to urinate and a painful, burning feeling in the bladder or urethra during urination. Older women and men are more likely to be tired, shaky, and weak and have muscle aches and abdominal pain. A fever may mean the infection is in your kidneys. Other symptoms of a kidney infection include pain in your back or sides below the ribs, nausea, and vomiting.  DIAGNOSIS  To diagnose a UTI, your caregiver will ask you about your symptoms. Your caregiver also will ask to provide a urine sample. The urine sample will be tested for bacteria and white blood cells. White blood cells are made by your body to help fight infection.  TREATMENT   Typically, UTIs can be treated with medication. Because most UTIs are caused by a bacterial infection, they usually can be treated with the use of antibiotics. The choice of antibiotic and length of treatment depend on your symptoms and the type of bacteria causing your infection.  HOME CARE INSTRUCTIONS   If you were prescribed antibiotics, take them exactly as your caregiver instructs you. Finish the medication even if you feel better after you  have only taken some of the medication.   Drink enough water and fluids to keep your urine clear or pale yellow.   Avoid caffeine, tea, and carbonated beverages. They tend to irritate your bladder.   Empty your bladder often. Avoid holding urine for long periods of time.   Empty your bladder before and after sexual intercourse.   After a bowel movement, women should cleanse from front to back. Use each tissue only once.  SEEK MEDICAL CARE IF:    You have back pain.   You develop a fever.   Your symptoms do not begin to resolve within 3 days.  SEEK IMMEDIATE MEDICAL CARE IF:    You have severe back pain or lower abdominal pain.   You develop chills.   You have nausea or vomiting.   You have continued burning or discomfort with urination.  MAKE SURE YOU:    Understand these instructions.   Will watch your condition.   Will get help right away if you are not doing well or get worse.  Document Released: 12/27/2004 Document Revised: 09/18/2011 Document Reviewed: 04/27/2011  [employer] Patient Information 2014 [employer].

## 2013-05-14 DIAGNOSIS — N390 Urinary tract infection, site not specified: Secondary | ICD-10-CM

## 2013-05-14 HISTORY — DX: Urinary tract infection, site not specified: N39.0

## 2013-05-14 NOTE — Progress Notes (Signed)
Patient ID: [name], female   DOB: 1945/09/12, 68 y.o.   MRN: [id number]  [name], MD Phone: [phone]  JAMESINA GAUGH is a 68 y.o. female who presents today for dysuria.  She notes over the past week has had dysuria, frequency, and urgency. She denies abdominal pain and fevers. She notes no history of UTI. She has not had discharge.  The following portions of the patient's history were reviewed and updated as appropriate: allergies, current medications, past medical history, family and social history, and problem list.  Patient is a nonsmoker.  Past Medical History  Diagnosis Date  . Colitis   . GERD (gastroesophageal reflux disease)   . Anemia   . Adrenal nodule   . Diverticulosis   . Hx of gastritis   . History of esophageal stricture   . Choledocholithiasis with obstruction   . Diabetes mellitus     pt denies  . Hiatal hernia   . Blood transfusion without reported diagnosis   . Cataract     bil removed  . Crohn's colitis 07/11/12    History  Smoking status  . Never Smoker   Smokeless tobacco  . Never Used    Family History  Problem Relation Age of Onset  . Bone cancer Maternal Grandmother   . Colon cancer Maternal Aunt 12  . Stomach cancer Sister 32    decsd  . Breast cancer Sister   . Esophageal cancer Neg Hx   . Rectal cancer Neg Hx     Current Outpatient Prescriptions on File Prior to Visit  Medication Sig Dispense Refill  . Cholecalciferol (VITAMIN D) 1000 UNITS capsule Take 1,000 Units by mouth every morning.       . folic acid (FOLVITE) 1 MG tablet Take 1 tablet (1 mg total) by mouth daily.  100 tablet  1  . omeprazole (PRILOSEC) 20 MG capsule TAKE ONE CAPSULE BY MOUTH EVERY MORNING  30 capsule  1  . sulfaSALAzine (AZULFIDINE) 500 MG tablet Take 1 tablet (500 mg total) by mouth 2 (two) times daily.  60 tablet  2   No current facility-administered medications on file prior to visit.    ROS: Per HPI   Physical Exam Filed  Vitals:   05/13/13 1522  BP: 120/70  Pulse: 78  Temp: 98.1 F (36.7 C)    Physical Examination: General appearance - alert, well appearing, and in no distress Chest - clear to auscultation, no wheezes, rales or rhonchi, symmetric air entry Heart - normal rate, regular rhythm, normal S1, S2, no murmurs, rubs, clicks or gallops Abdomen - soft, nontender, nondistended, no masses or organomegaly   Assessment/Plan: Please see individual problem list.

## 2013-05-14 NOTE — Assessment & Plan Note (Signed)
Patient with UTI on UA and by symptoms. Will treat with keflex 500 mg BID. UCx sent.

## 2013-05-15 ENCOUNTER — Other Ambulatory Visit: Payer: Self-pay | Admitting: Internal Medicine

## 2013-05-15 LAB — URINE CULTURE: Colony Count: >=100000

## 2013-05-31 HISTORY — PX: CARDIAC CATHETERIZATION: SHX172

## 2013-06-10 ENCOUNTER — Ambulatory Visit (INDEPENDENT_AMBULATORY_CARE_PROVIDER_SITE_OTHER): Payer: Medicare Other | Admitting: Family Medicine

## 2013-06-10 ENCOUNTER — Telehealth: Payer: Self-pay | Admitting: Family Medicine

## 2013-06-10 ENCOUNTER — Encounter: Payer: Self-pay | Admitting: Family Medicine

## 2013-06-10 ENCOUNTER — Telehealth: Payer: Self-pay | Admitting: Internal Medicine

## 2013-06-10 VITALS — BP 100/60 | HR 68 | Temp 98.6°F | Wt 137.0 lb

## 2013-06-10 DIAGNOSIS — D649 Anemia, unspecified: Secondary | ICD-10-CM

## 2013-06-10 DIAGNOSIS — R197 Diarrhea, unspecified: Secondary | ICD-10-CM | POA: Diagnosis not present

## 2013-06-10 DIAGNOSIS — K5090 Crohn's disease, unspecified, without complications: Secondary | ICD-10-CM | POA: Insufficient documentation

## 2013-06-10 HISTORY — DX: Crohn's disease, unspecified, without complications: K50.90

## 2013-06-10 LAB — POCT HEMOGLOBIN: HEMOGLOBIN: 13.1 g/dL (ref 12.2–16.2)

## 2013-06-10 MED ORDER — LOPERAMIDE HCL 2 MG PO TABS: 2.0000 mg | ORAL_TABLET | Freq: Four times a day (QID) | ORAL | Status: DC | PRN

## 2013-06-10 NOTE — Patient Instructions (Signed)
Crohn's Disease Crohn's disease is a long-term (chronic) soreness and redness (inflammation) of the intestines (bowel). It can affect any portion of the digestive tract, from the mouth to the anus. It can also cause problems outside the digestive tract. Crohn's disease is closely related to a disease called ulcerative colitis (together, these two diseases are called inflammatory bowel disease).  CAUSES  The cause of Crohn's disease is not known. One Link Snuffer is that, in an easily affected person, the immune system is triggered to attack the body's own digestive tissue. Crohn's disease runs in families. It seems to be more common in certain geographic areas and amongst certain races. There are no clear-cut dietary causes.  SYMPTOMS  Crohn's disease can cause many different symptoms since it can affect many different parts of the body. Symptoms include:  Fatigue.  Weight loss.  Chronic diarrhea, sometime bloody.  Abdominal pain and cramps.  Fever.  Ulcers or canker sores in the mouth or rectum.  Anemia (low red blood cells).  Arthritis, skin problems, and eye problems may occur. Complications of Crohn's disease can include:  Series of holes (perforation) of the bowel.  Portions of the intestines sticking to each other (adhesions).  Obstruction of the bowel.  Fistula formation, typically in the rectal area but also in other areas. A fistula is an opening between the bowels and the outside, or between the bowels and another organ.  A painful crack in the mucous membrane of the anus (rectal fissure). DIAGNOSIS  Your caregiver may suspect Crohn's disease based on your symptoms and an exam. Blood tests may confirm that there is a problem. You may be asked to submit a stool specimen for examination. X-rays and CT scans may be necessary. Ultimately, the diagnosis is usually made after a procedure that uses a flexible tube that is inserted via your mouth or your anus. This is done under  sedation and is called either an upper endoscopy or colonoscopy. With these tests, the specialist can take tiny tissue samples and remove them from the inside of the bowel (biopsy). Examination of this biopsy tissue under a microscope can reveal Crohn's disease as the cause of your symptoms. Due to the many different forms that Crohn's disease can take, symptoms may be present for several years before a diagnosis is made. TREATMENT  Medications are often used to decrease inflammation and control the immune system. These include medicines related to aspirin, steroid medications, and newer and stronger medications to slow down the immune system. Some medications may be used as suppositories or enemas. A number of other medications are used or have been studied. Your caregiver will make specific recommendations. HOME CARE INSTRUCTIONS   Symptoms such as diarrhea can be controlled with medications. Avoid foods that have a laxative effect such as fresh fruit, vegetables and dairy products. During flare ups, you can rest your bowel by refraining from solid foods. Drink clear liquids frequently during the day (electrolyte or re-hydrating fluids are best. Your caregiver can help you with suggestions). Drink often to prevent loss of body fluids (dehydration). When diarrhea has cleared, eat small meals and more frequently. Avoid food additives and stimulants such as caffeine (coffee, tea, or chocolate). Enzyme supplements may help if you develop intolerance to a sugar in dairy products (lactose). Ask your caregiver or dietitian about specific dietary instructions.  Try to maintain a positive attitude. Learn relaxation techniques such as self hypnosis, mental imaging, and muscle relaxation.  If possible, avoid stresses which can aggravate your condition.  Exercise regularly.  Follow your diet.  Always get plenty of rest. SEEK MEDICAL CARE IF:   Your symptoms fail to improve after a week or two of new  treatment.  You experience continued weight loss.  You have ongoing cramps or loose bowels.  You develop a new skin rash, skin sores, or eye problems. SEEK IMMEDIATE MEDICAL CARE IF:   You have worsening of your symptoms or develop new symptoms.  You have a fever.  You develop bloody diarrhea.  You develop severe abdominal pain. MAKE SURE YOU:   Understand these instructions.  Will watch your condition.  Will get help right away if you are not doing well or get worse. Document Released: 12/27/2004 Document Revised: 07/14/2012 Document Reviewed: 11/25/2006 [facility] Patient Information 2014 [place], [place].

## 2013-06-10 NOTE — Progress Notes (Signed)
Subjective:     Patient ID: Diane Freeman, female   DOB: [birth year], 68 y.o.   MRN: [id number]  HPI 69 y.o. F here with complaints of 1 week of recurrent diarrhea, no blood, no black stool. Pt is having 3-4 bouts of diarrhea and having emesis once a day of yellow. Pt is having difficulty tolerating food. Able to eat crackers but not much else    Pt had a colonoscopy last year.   Review of Systems Pt is also feeling weak for 4-5 days. occassional left sided chest pain 2-3x/week hurts and resolves with laying down resolves in 82mn, with occassional jaw pain, but no palpations, stings, endorses SOB, no light headedness or headache, occassional dizziness with standing. No issues with urination.  Unintentional weight loss  No hx of tobacco, no drinking, no drugs     Objective:   Physical Exam Filed Vitals:   06/10/13 1339  BP: 100/60  Pulse: 68  Temp: 98.6 F (37 C)  weight 137lb unintentional 99% on RA  NAD, VSS Mild Tachypnea, CTAB no wrc RRR no mgt NBSx4, ND, NTTP No c/c/e No cervical LAD  POCT Hgb: 13.1    Assessment:     68y.o. f with recent weight loss, recurrent diarrhea and emesis. No evidence of infection. Concern for crons flare. Will refer to GI ASAP. Pt seen by Dr. BOlevia Perchespreviously. Based on the Crohns Disease Activity Index pt is have a moderate to severe flare based on poor PO intake and weight loss with abdominal pain. Will refer urgently to GI for further evaluation and consideration of therapy intervention. Will defer start immodium at this time but no other meds   Doubt infectious etiology given afebrile, no recent exposure.  Pt currently stable but is ill.   F/u in 2 weeks or go to ED if symptoms worsening.  MFredrik Rigger MD OB Fellow

## 2013-06-10 NOTE — Telephone Encounter (Signed)
FYI pt has an appt today.  Jazmin Hartsell,CMA

## 2013-06-10 NOTE — Telephone Encounter (Signed)
Scheduled patient on 06/12/13 at 1:30 PM with Nicoletta Ba, PA . Line busy will try again later.

## 2013-06-10 NOTE — Telephone Encounter (Signed)
Spoke with Diane Freeman and gave her appointment. She will call patient.

## 2013-06-10 NOTE — Telephone Encounter (Signed)
Sister called to inform doctor of how sick Diane Freeman is. She is loosing weight and not eating. She said that there is a history of stomach cancer with the family. She wanted you know because she was afraid that [place] express how sick she really is. Please call her if you have any questions. jw

## 2013-06-10 NOTE — Addendum Note (Signed)
Addended by: Christen Bame D on: 06/10/2013 03:58 PM   Modules accepted: Orders

## 2013-06-12 ENCOUNTER — Ambulatory Visit (INDEPENDENT_AMBULATORY_CARE_PROVIDER_SITE_OTHER): Payer: Medicare Other | Admitting: Physician Assistant

## 2013-06-12 ENCOUNTER — Inpatient Hospital Stay (HOSPITAL_COMMUNITY)
Admission: EM | Admit: 2013-06-12 | Discharge: 2013-06-15 | DRG: 287 | Disposition: A | Discharge status: Home / Self Care | Payer: Medicare Other | Attending: Family Medicine | Admitting: Family Medicine

## 2013-06-12 ENCOUNTER — Encounter (HOSPITAL_COMMUNITY): Payer: Self-pay | Admitting: Emergency Medicine

## 2013-06-12 ENCOUNTER — Encounter: Payer: Self-pay | Admitting: Physician Assistant

## 2013-06-12 ENCOUNTER — Emergency Department (HOSPITAL_COMMUNITY): Payer: Medicare Other

## 2013-06-12 VITALS — BP 110/78 | HR 70 | Ht 62.25 in | Wt 137.0 lb

## 2013-06-12 DIAGNOSIS — K5730 Diverticulosis of large intestine without perforation or abscess without bleeding: Secondary | ICD-10-CM | POA: Diagnosis present

## 2013-06-12 DIAGNOSIS — E860 Dehydration: Secondary | ICD-10-CM | POA: Diagnosis present

## 2013-06-12 DIAGNOSIS — K5010 Crohn's disease of large intestine without complications: Secondary | ICD-10-CM | POA: Diagnosis present

## 2013-06-12 DIAGNOSIS — K5090 Crohn's disease, unspecified, without complications: Secondary | ICD-10-CM

## 2013-06-12 DIAGNOSIS — R197 Diarrhea, unspecified: Secondary | ICD-10-CM | POA: Diagnosis not present

## 2013-06-12 DIAGNOSIS — R531 Weakness: Secondary | ICD-10-CM

## 2013-06-12 DIAGNOSIS — Z79899 Other long term (current) drug therapy: Secondary | ICD-10-CM | POA: Diagnosis not present

## 2013-06-12 DIAGNOSIS — R0789 Other chest pain: Principal | ICD-10-CM | POA: Diagnosis present

## 2013-06-12 DIAGNOSIS — I509 Heart failure, unspecified: Secondary | ICD-10-CM | POA: Diagnosis not present

## 2013-06-12 DIAGNOSIS — I499 Cardiac arrhythmia, unspecified: Secondary | ICD-10-CM | POA: Diagnosis present

## 2013-06-12 DIAGNOSIS — I369 Nonrheumatic tricuspid valve disorder, unspecified: Secondary | ICD-10-CM | POA: Diagnosis not present

## 2013-06-12 DIAGNOSIS — I498 Other specified cardiac arrhythmias: Secondary | ICD-10-CM

## 2013-06-12 DIAGNOSIS — N390 Urinary tract infection, site not specified: Secondary | ICD-10-CM | POA: Diagnosis present

## 2013-06-12 DIAGNOSIS — R079 Chest pain, unspecified: Secondary | ICD-10-CM

## 2013-06-12 DIAGNOSIS — R072 Precordial pain: Secondary | ICD-10-CM | POA: Diagnosis not present

## 2013-06-12 DIAGNOSIS — R911 Solitary pulmonary nodule: Secondary | ICD-10-CM | POA: Diagnosis present

## 2013-06-12 DIAGNOSIS — R5383 Other fatigue: Secondary | ICD-10-CM | POA: Diagnosis not present

## 2013-06-12 DIAGNOSIS — E785 Hyperlipidemia, unspecified: Secondary | ICD-10-CM | POA: Diagnosis present

## 2013-06-12 DIAGNOSIS — K449 Diaphragmatic hernia without obstruction or gangrene: Secondary | ICD-10-CM | POA: Diagnosis present

## 2013-06-12 DIAGNOSIS — K219 Gastro-esophageal reflux disease without esophagitis: Secondary | ICD-10-CM | POA: Diagnosis present

## 2013-06-12 DIAGNOSIS — R0600 Dyspnea, unspecified: Secondary | ICD-10-CM

## 2013-06-12 DIAGNOSIS — I2510 Atherosclerotic heart disease of native coronary artery without angina pectoris: Secondary | ICD-10-CM | POA: Diagnosis present

## 2013-06-12 DIAGNOSIS — R0602 Shortness of breath: Secondary | ICD-10-CM

## 2013-06-12 DIAGNOSIS — R112 Nausea with vomiting, unspecified: Secondary | ICD-10-CM | POA: Diagnosis not present

## 2013-06-12 DIAGNOSIS — Z7982 Long term (current) use of aspirin: Secondary | ICD-10-CM | POA: Diagnosis not present

## 2013-06-12 DIAGNOSIS — R5381 Other malaise: Secondary | ICD-10-CM | POA: Diagnosis not present

## 2013-06-12 DIAGNOSIS — I5030 Unspecified diastolic (congestive) heart failure: Secondary | ICD-10-CM | POA: Diagnosis not present

## 2013-06-12 LAB — URINE MICROSCOPIC-ADD ON

## 2013-06-12 LAB — URINALYSIS, ROUTINE W REFLEX MICROSCOPIC
Glucose, UA: NEGATIVE mg/dL
Ketones, ur: 40 mg/dL — AB
NITRITE: POSITIVE — AB
PH: 5.5 (ref 5.0–8.0)
Protein, ur: 30 mg/dL — AB
SPECIFIC GRAVITY, URINE: 1.034 — AB (ref 1.005–1.030)
Urobilinogen, UA: 1 mg/dL (ref 0.0–1.0)

## 2013-06-12 LAB — CBC
HCT: 38.6 % (ref 36.0–46.0)
HCT: 34.9 % — ABNORMAL LOW (ref 36.0–46.0)
HEMOGLOBIN: 13.1 g/dL (ref 12.0–15.0)
Hemoglobin: 11.6 g/dL — ABNORMAL LOW (ref 12.0–15.0)
MCH: 27.6 pg (ref 26.0–34.0)
MCH: 27 pg (ref 26.0–34.0)
MCHC: 33.9 g/dL (ref 30.0–36.0)
MCHC: 33.2 g/dL (ref 30.0–36.0)
MCV: 81.3 fL (ref 78.0–100.0)
MCV: 81.4 fL (ref 78.0–100.0)
PLATELETS: 257 10*3/uL (ref 150–400)
Platelets: 244 10*3/uL (ref 150–400)
RBC: 4.75 MIL/uL (ref 3.87–5.11)
RBC: 4.29 MIL/uL (ref 3.87–5.11)
RDW: 14.5 % (ref 11.5–15.5)
RDW: 14.7 % (ref 11.5–15.5)
WBC: 9.4 10*3/uL (ref 4.0–10.5)
WBC: 7.2 10*3/uL (ref 4.0–10.5)

## 2013-06-12 LAB — COMPREHENSIVE METABOLIC PANEL
ALBUMIN: 2.2 g/dL — AB (ref 3.5–5.2)
ALK PHOS: 127 U/L — AB (ref 39–117)
ALT: 15 U/L (ref 0–35)
AST: 10 U/L (ref 0–37)
BUN: 25 mg/dL — ABNORMAL HIGH (ref 6–23)
CO2: 23 mEq/L (ref 19–32)
Calcium: 8.5 mg/dL (ref 8.4–10.5)
Chloride: 97 mEq/L (ref 96–112)
Creatinine, Ser: 0.74 mg/dL (ref 0.50–1.10)
GFR calc non Af Amer: 85 mL/min — ABNORMAL LOW (ref >90)
Glucose, Bld: 132 mg/dL — ABNORMAL HIGH (ref 70–99)
POTASSIUM: 3.2 meq/L — AB (ref 3.7–5.3)
Sodium: 135 mEq/L — ABNORMAL LOW (ref 137–147)
Total Bilirubin: 0.7 mg/dL (ref 0.3–1.2)
Total Protein: 6.4 g/dL (ref 6.0–8.3)

## 2013-06-12 LAB — PROTIME-INR
INR: 1.13 (ref 0.00–1.49)
Prothrombin Time: 14.3 seconds (ref 11.6–15.2)

## 2013-06-12 LAB — I-STAT TROPONIN, ED: Troponin i, poc: 0 ng/mL (ref 0.00–0.08)

## 2013-06-12 LAB — I-STAT CG4 LACTIC ACID, ED: Lactic Acid, Venous: 2.15 mmol/L (ref 0.5–2.2)

## 2013-06-12 MED ORDER — ACETAMINOPHEN 650 MG RE SUPP: 650.0000 mg | Freq: Four times a day (QID) | RECTAL | Status: DC | PRN

## 2013-06-12 MED ORDER — PANTOPRAZOLE SODIUM 40 MG IV SOLR
40.0000 mg | INTRAVENOUS | Status: DC
  Administered 2013-06-12 – 2013-06-14 (×3): 40 mg via INTRAVENOUS
  Filled 2013-06-12 (×4): qty 40

## 2013-06-12 MED ORDER — DEXTROSE 5 % IV SOLN
1.0000 g | INTRAVENOUS | Status: DC
  Administered 2013-06-13 – 2013-06-14 (×2): 1 g via INTRAVENOUS
  Filled 2013-06-12 (×3): qty 10

## 2013-06-12 MED ORDER — ONDANSETRON HCL 4 MG/2ML IJ SOLN
4.0000 mg | Freq: Four times a day (QID) | INTRAMUSCULAR | Status: DC | PRN
  Administered 2013-06-13 – 2013-06-14 (×2): 4 mg via INTRAVENOUS
  Filled 2013-06-12 (×2): qty 2

## 2013-06-12 MED ORDER — SODIUM CHLORIDE 0.9 % IJ SOLN
3.0000 mL | Freq: Two times a day (BID) | INTRAMUSCULAR | Status: DC
Start: 2013-06-12 — End: 2013-06-15
  Administered 2013-06-13 – 2013-06-14 (×2): 3 mL via INTRAVENOUS

## 2013-06-12 MED ORDER — ONDANSETRON HCL 4 MG/2ML IJ SOLN
4.0000 mg | Freq: Once | INTRAMUSCULAR | Status: AC
  Administered 2013-06-12: 4 mg via INTRAVENOUS
  Filled 2013-06-12: qty 2

## 2013-06-12 MED ORDER — ONDANSETRON HCL 4 MG PO TABS: 4.0000 mg | ORAL_TABLET | Freq: Four times a day (QID) | ORAL | Status: DC | PRN

## 2013-06-12 MED ORDER — SODIUM CHLORIDE 0.9 % IV BOLUS (SEPSIS)
1000.0000 mL | Freq: Once | INTRAVENOUS | Status: AC
  Administered 2013-06-12: 1000 mL via INTRAVENOUS

## 2013-06-12 MED ORDER — ENOXAPARIN SODIUM 40 MG/0.4ML ~~LOC~~ SOLN
40.0000 mg | SUBCUTANEOUS | Status: DC
  Administered 2013-06-12 – 2013-06-14 (×3): 40 mg via SUBCUTANEOUS
  Filled 2013-06-12 (×4): qty 0.4

## 2013-06-12 MED ORDER — ASPIRIN EC 81 MG PO TBEC
81.0000 mg | DELAYED_RELEASE_TABLET | Freq: Every day | ORAL | Status: DC
  Administered 2013-06-13 – 2013-06-14 (×2): 81 mg via ORAL
  Filled 2013-06-12 (×3): qty 1

## 2013-06-12 MED ORDER — ACETAMINOPHEN 325 MG PO TABS: 650.0000 mg | ORAL_TABLET | Freq: Four times a day (QID) | ORAL | Status: DC | PRN

## 2013-06-12 MED ORDER — DEXTROSE 5 % IV SOLN
1.0000 g | Freq: Once | INTRAVENOUS | Status: AC
  Administered 2013-06-12: 1 g via INTRAVENOUS
  Filled 2013-06-12: qty 10

## 2013-06-12 MED ORDER — NITROGLYCERIN 0.4 MG SL SUBL: 0.4000 mg | SUBLINGUAL_TABLET | SUBLINGUAL | Status: DC | PRN

## 2013-06-12 MED ORDER — OXYCODONE HCL 5 MG PO TABS: 5.0000 mg | ORAL_TABLET | ORAL | Status: DC | PRN

## 2013-06-12 MED ORDER — POTASSIUM CHLORIDE CRYS ER 20 MEQ PO TBCR
40.0000 meq | EXTENDED_RELEASE_TABLET | Freq: Once | ORAL | Status: AC
  Administered 2013-06-12: 40 meq via ORAL
  Filled 2013-06-12: qty 2

## 2013-06-12 MED ORDER — PREDNISONE 50 MG PO TABS
50.0000 mg | ORAL_TABLET | Freq: Every day | ORAL | Status: DC
  Administered 2013-06-13 – 2013-06-15 (×3): 50 mg via ORAL
  Filled 2013-06-12 (×4): qty 1

## 2013-06-12 MED ORDER — POTASSIUM CHLORIDE IN NACL 20-0.9 MEQ/L-% IV SOLN
INTRAVENOUS | Status: DC
Start: 2013-06-12 — End: 2013-06-13
  Administered 2013-06-12 – 2013-06-13 (×2): via INTRAVENOUS
  Filled 2013-06-12 (×3): qty 1000

## 2013-06-12 MED ORDER — MORPHINE SULFATE 2 MG/ML IJ SOLN
1.0000 mg | INTRAMUSCULAR | Status: DC | PRN
  Administered 2013-06-13 (×2): 1 mg via INTRAVENOUS
  Filled 2013-06-12 (×2): qty 1

## 2013-06-12 NOTE — Progress Notes (Signed)
Subjective:    Patient ID: Diane Freeman, female    DOB: [birth year], 68 y.o.   MRN: [id number]  HPI  Diane Freeman is a pleasant 68 year old white female known to Dr. Delfin Edis who has history of Crohn's colitis was diagnosed in 2007. She has had relatively mild disease. Last colonoscopy was done in April of 2014 showing left-sided ulcerative colitis and she was treated with delta call. She has also had an iron deficiency. Patient was last seen in August and at that time had been unable to afford to [facility] call and was switched to sulfasalazine 500 mg 2 tablets daily as well as folic acid. Patient was referred to our office today after being seen by primary care at the family practice clinic Dr. Candace Cruise to him 2 days ago with concerns for a Crohn's flare. Patient states that she has had intermittent left-sided chest pain over the past week to week and a half and has been associated with progressive shortness of breath with exertion and intermittent left jaw pain. He says the pain is definitely exacerbated by any exertion and relieved by lying down she currently his feeling weak and feels that she's been having increasing frequency of episodes of chest pain. She has had 2 previous episodes of chest pain today though denies have chest pain during the exam. She is unable to speak in full sentences due to shortness of breath. She says she has had a couple of episodes of vomiting this week has lower Donald cramping and is having 2-3 diarrheal stools per day without blood. Hemoglobin was checked 2 days ago with fingerstick and this was 13  Patient has no prior history of cardiopulmonary disease. Family history is positive for her son who has coronary disease and is status post stents in his 55s.    Review of Systems  Constitutional: Positive for activity change, appetite change and fatigue.  HENT: Negative.   Eyes: Negative.   Respiratory: Positive for chest tightness and shortness of breath.     Cardiovascular: Positive for chest pain.  Gastrointestinal: Positive for nausea, abdominal pain and diarrhea.  Endocrine: Negative.   Genitourinary: Negative.   Allergic/Immunologic: Negative.   Neurological: Positive for weakness.  Hematological: Negative.   Psychiatric/Behavioral: Negative.    Outpatient Prescriptions Prior to Visit  Medication Sig Dispense Refill  . folic acid (FOLVITE) 1 MG tablet Take 1 tablet (1 mg total) by mouth daily.  100 tablet  1  . omeprazole (PRILOSEC) 20 MG capsule TAKE ONE CAPSULE BY MOUTH EVERY MORNING  30 capsule  1  . cephALEXin (KEFLEX) 500 MG capsule Take 1 capsule (500 mg total) by mouth 2 (two) times daily.  14 capsule  0  . Cholecalciferol (VITAMIN D) 1000 UNITS capsule Take 1,000 Units by mouth every morning.       . loperamide (LOPERAMIDE A-D) 2 MG tablet Take 1 tablet (2 mg total) by mouth 4 (four) times daily as needed for diarrhea or loose stools.  30 tablet  0  . sulfaSALAzine (AZULFIDINE) 500 MG tablet Take 1 tablet (500 mg total) by mouth 2 (two) times daily.  60 tablet  2   No facility-administered medications prior to visit.   No Known Allergies Patient Active Problem List   Diagnosis Date Noted  . Crohn's disease 06/10/2013  . UTI (urinary tract infection) 05/14/2013  . External hemorrhoid 02/05/2012  . Rectal bleeding 02/05/2012  . Routine health maintenance 02/05/2012  . Obstruction of bile duct 06/02/2011  . Choledocholithiasis with  obstruction 06/02/2011  . Nonspecific elevation of levels of transaminase or lactic acid dehydrogenase (LDH) 06/02/2011  . Hypokalemia 06/02/2011  . Vitamin d deficiency 05/29/2011  . Elevated BP 05/29/2011  . Diverticulitis 06/19/2010  . ANEMIA 11/27/2007  . ESOPHAGEAL STRICTURE 11/27/2007  . GERD 11/27/2007  . DUODENITIS 11/27/2007   History  Substance Use Topics  . Smoking status: Never Smoker   . Smokeless tobacco: Never Used  . Alcohol Use: No   family history includes Bone cancer  in her maternal grandmother; Breast cancer in her sister; Colon cancer (age of onset: 83) in her maternal aunt; Stomach cancer (age of onset: 37) in her sister. There is no history of Esophageal cancer or Rectal cancer.     Objective:   Physical Exam  well-developed older white female short of breath sitting in a chair. Somewhat winded with conversation. Blood pressure 110/78 pulse 70 height 5 foot 2 weight 137. HEENT nontraumatic normocephalic EOMI PERRLA sclera anicteric, Supple no JVD, Cardiovascular regular rate and rhythm with S1-S2 occasional ectopic but no murmur or gallop, Pulmonary clear bilaterally, Abdomen soft she has mild tenderness in the bilateral lower quadrants right greater than left no guarding or rebound bowel sounds are present, Rectal exam not done, Extremities no clubbing cyanosis or edema skin warm and dry no beds are somewhat pale, Psych mood and affect appropriate        Assessment & Plan:  #94  68 year old female with Crohn's colitis with probable exacerbation with increase in abdominal cramping and diarrhea  #2 intermittent exertional chest pain x1 week progressive and associated with progressive shortness of breath with exertion and weakness. Patient has had intermittent left-sided jaw pain with these episodes as well Vitals are stable but am quite concerned that she is having crescendo angina.  Plan; patient will be transported by EMS to [facility] emergency room for cardiac evaluation If she needs to GI consultation during her dementia and will be happy to see her once her cardiac issues are sorted out.

## 2013-06-12 NOTE — ED Provider Notes (Signed)
CSN: [id number]     Arrival date & time 06/12/13  1451 History   First MD Initiated Contact with Patient 06/12/13 1503     Chief Complaint  Patient presents with  . Chest Pain  . Abdominal Pain     (Consider location/radiation/quality/duration/timing/severity/associated sxs/prior Treatment) Patient is a 68 y.o. female presenting with chest pain and abdominal pain. The history is provided by the patient.  Chest Pain Pain location:  Substernal area Pain quality: sharp (stinging)   Pain radiates to:  L jaw Pain radiates to the back: no   Pain severity:  Moderate Onset quality:  Sudden Timing:  Intermittent Progression:  Worsening Chronicity:  New Context: not at rest   Relieved by:  Rest Associated symptoms: abdominal pain, fatigue and vomiting   Associated symptoms: no diaphoresis, no dizziness and no fever   Abdominal Pain Associated symptoms: chest pain, fatigue and vomiting   Associated symptoms: no fever     Past Medical History  Diagnosis Date  . Colitis   . GERD (gastroesophageal reflux disease)   . Anemia   . Adrenal nodule   . Diverticulosis   . Hx of gastritis   . History of esophageal stricture   . Choledocholithiasis with obstruction   . Diabetes mellitus     pt denies  . Hiatal hernia   . Blood transfusion without reported diagnosis   . Cataract     bil removed  . Crohn's colitis 07/11/12   Past Surgical History  Procedure Laterality Date  . Cholecystectomy  2009  . Tubal ligation    . Cataract extraction    . Ercp  06/02/2011    Procedure: ENDOSCOPIC RETROGRADE CHOLANGIOPANCREATOGRAPHY (ERCP);  Surgeon: Lafayette Dragon, MD;  Location: [facility];  Service: Endoscopy;  Laterality: N/A;  dottie /ja  . Ercp  06/02/2011    Procedure: ENDOSCOPIC RETROGRADE CHOLANGIOPANCREATOGRAPHY (ERCP);  Surgeon: Gatha Mayer, MD;  Location: [facility];  Service: Endoscopy;  Laterality: N/A;  . Colonoscopy    . Upper gastrointestinal endoscopy     Family History   Problem Relation Age of Onset  . Bone cancer Maternal Grandmother   . Colon cancer Maternal Aunt 38  . Stomach cancer Sister 40    decsd  . Breast cancer Sister   . Esophageal cancer Neg Hx   . Rectal cancer Neg Hx    History  Substance Use Topics  . Smoking status: Never Smoker   . Smokeless tobacco: Never Used  . Alcohol Use: No   OB History   Grav Para Term Preterm Abortions TAB SAB Ect Mult Living                 Review of Systems  Constitutional: Positive for fatigue. Negative for fever and diaphoresis.  Cardiovascular: Positive for chest pain.  Gastrointestinal: Positive for vomiting and abdominal pain.  Neurological: Negative for dizziness.  All other systems reviewed and are negative.      Allergies  Review of patient's allergies indicates no known allergies.  Home Medications   Current Outpatient Rx  Name  Route  Sig  Dispense  Refill  . folic acid (FOLVITE) 1 MG tablet   Oral   Take 1 tablet (1 mg total) by mouth daily.   100 tablet   1   . omeprazole (PRILOSEC) 20 MG capsule      TAKE ONE CAPSULE BY MOUTH EVERY MORNING   30 capsule   1    BP 114/64  Pulse 91  Temp(Src)  97.4 F (36.3 C) (Oral)  Resp 22  Wt 137 lb (62.143 kg)  SpO2 97% Physical Exam  Nursing note and vitals reviewed. Constitutional: She is oriented to person, place, and time. She appears well-developed and well-nourished. No distress.  HENT:  Head: Normocephalic and atraumatic.  Mouth/Throat: Mucous membranes are dry.  Eyes: EOM are normal. Pupils are equal, round, and reactive to light.  Neck: Normal range of motion. Neck supple.  Cardiovascular: Normal rate and regular rhythm.  Exam reveals no friction rub.   No murmur heard. Pulmonary/Chest: Effort normal and breath sounds normal. No respiratory distress. She has no wheezes. She has no rales.  Abdominal: Soft. She exhibits no distension. There is tenderness (mild, diffuse). There is no rebound.  Musculoskeletal:  Normal range of motion. She exhibits no edema.  Neurological: She is alert and oriented to person, place, and time. She exhibits normal muscle tone.  Skin: No rash noted. She is not diaphoretic.    ED Course  Procedures (including critical care time) Labs Review Labs Reviewed  COMPREHENSIVE METABOLIC PANEL - Abnormal; Notable for the following:    Sodium 135 (*)    Potassium 3.2 (*)    Glucose, Bld 132 (*)    BUN 25 (*)    Albumin 2.2 (*)    Alkaline Phosphatase 127 (*)    GFR calc non Af Amer 85 (*)    All other components within normal limits  URINALYSIS, ROUTINE W REFLEX MICROSCOPIC - Abnormal; Notable for the following:    Color, Urine AMBER (*)    APPearance CLOUDY (*)    Specific Gravity, Urine 1.034 (*)    Hgb urine dipstick MODERATE (*)    Bilirubin Urine MODERATE (*)    Ketones, ur 40 (*)    Protein, ur 30 (*)    Nitrite POSITIVE (*)    Leukocytes, UA MODERATE (*)    All other components within normal limits  URINE MICROSCOPIC-ADD ON - Abnormal; Notable for the following:    Squamous Epithelial / LPF MANY (*)    Bacteria, UA MANY (*)    All other components within normal limits  URINE CULTURE  CBC  PROTIME-INR  I-STAT TROPOININ, ED  I-STAT CG4 LACTIC ACID, ED   Imaging Review Dg Chest 2 View  06/12/2013   CLINICAL DATA:  Left-sided chest pain  EXAM: CHEST  2 VIEW  COMPARISON:  05/29/2011  FINDINGS: Small hiatal hernia is noted. The cardiac shadow is stable. The lungs are clear bilaterally. No acute bony abnormality is seen.  IMPRESSION: No active cardiopulmonary disease.   Electronically Signed   By: Inez Catalina M.D.   On: 06/12/2013 17:23     EKG Interpretation None      Date: 06/12/2013  Rate: 91  Rhythm: normal sinus rhythm  QRS Axis: left  Intervals: normal  ST/T Wave abnormalities: normal  Conduction Disutrbances:none  Narrative Interpretation:   Old EKG Reviewed: unchanged   MDM   Final diagnoses:  UTI (lower urinary tract infection)   Nausea vomiting and diarrhea  Chest pain    58F here for chest pain eval. Was seen in GI doc's office today for concerns of Crohn's flare. Noted to them she was having exertional CP for past 2 weeks. She tells me stinging, radiating to jaw episodes of CP, intermittently for past 2 weeks that are worsening. Also states vomiting and diarrhea that are worsening for past 2 weeks. Denies fevers. No dizziness or fainiting. Here vitals stable. Clinically dry with tacky mucus  membranes. Belly with diffuse tenderness, no peritoneal signs.  Labs ordered, initial EKG without acute ischemia. Patient's labs with normal lactate, normal white count. UA with UTI. Rocephin given. Admitted to medicine.   Osvaldo Shipper, MD 06/12/13 [phone]

## 2013-06-12 NOTE — ED Notes (Signed)
MD at bedside. Dr. Mingo Amber.

## 2013-06-12 NOTE — ED Notes (Signed)
Pt arrives [facility] from [facility]. Pt with N/V/D chest pain radiating to jaw.

## 2013-06-12 NOTE — ED Notes (Signed)
MD at bedside. Dr. Lacinda Axon. Family Practice.

## 2013-06-12 NOTE — H&P (Signed)
[facility] Admission History and Physical Service Pager: [phone]  Patient name: Diane Freeman Medical record number: [id number] Date of birth: 1945-11-25 Age: 68 y.o. Gender: female  Primary Care Provider: [name], MD Consultants: GI & Cardiology in the am Code Status: Full Code  Chief Complaint: Abdominal pain, N/V/D, Chest pain  Assessment and Plan: Diane Freeman is a 68 y.o. female presenting with chest pain and Crohn's flare. PMH is significant for  Crohn's disease, GERD, and recent UTI.  # Chest pain -  History consistent with unstable angina.  EKG non-ischemic and POC Troponin negative. CAD risk factors: Family Hx; HLD (LDL of 177 and HDL of 35 in 2013).  - Will admit to Telemetry - Will cycle Troponin x 3  - EKG in the am; Also obtaining Echo and BNP - Given history, patient will likely need cardiology consult and stress test during admission - Risk Stratification labs: Lipid panel, TSH, A1C - ASA 81 mg daily - Morphine PRN for severe pain, PRN SL Nitro  # Crohn's disease exacerbation/flare -  Patient with recent increasing abdominal pain, diarrhea, nausea, vomiting and decreased PO intake.   - Clear liquid diet - IV fluids - NS w/ 20 mEq of KCl - PO Prednisone 50 mg daily - Will consider GI consultation during admission  # Hx of HLD - Obtaining Lipid panel - Based on prior results, patient will likely need statin.  # GERD  - Protonix daily  # ? UTI - UA was done in the ED and patient was started on empiric Rocephin given UA findings -  However, urinalysis revealed many squamous cells consistent with dirty catch.  Patient no dysuria.  Will await urine culture results.   FEN/GI: Clear liquid diet, Fluids as above Prophylaxis: Lovenox  Disposition: Pending further workup and clinical improvement   History of Present Illness:  Diane Freeman is a 68 y.o. female with a PMH of Crohn's disease, GERD, and recent UTI  (2/21) presents from GI office for evaluation of chest pain.  Patient reports that for the past 2 weeks she has been having abdominal pain with associated nausea, intermittent vomiting, and diarrhea.  Abdominal pain described as cramping and is located diffusely. Emesis described as "yellow"; nonbloody and nonbilious.  She has been having frequent diarrhea which is also nonbloody.  She has poor PO intake over this period of time.  She has been seen by [facility] and GI for this.  She was seen by GI today and was sent over for evaluation given reports of recent chest pain (see below).  Patient also reports that she has had left sided chest pain for approximately the past month.  She reports that her pain is left-sided (not substernal), and is sharp in character.  Pain is moderate in severity.  Chest pain occurs with exertion (minimal activity) and lasts a few minutes until she rests at which time it is relieved.  Patient denies any associated diaphoresis.   She does note associated shortness of breath.  She has had a few instances where the pain has radiated to her left jaw.    Review Of Systems: Per HPI with the following additions: Patient denies any dysuria.  She does state that she had dysuria previously been diagnosed with UTI but this is resolved.  Otherwise 12 point review of systems was performed and was unremarkable.  Patient Active Problem List   Diagnosis Date Noted  . UTI (lower urinary tract infection) 06/12/2013  .  Dehydration 06/12/2013  . Crohn's disease 06/10/2013  . UTI (urinary tract infection) 05/14/2013  . External hemorrhoid 02/05/2012  . Rectal bleeding 02/05/2012  . Routine health maintenance 02/05/2012  . Obstruction of bile duct 06/02/2011  . Choledocholithiasis with obstruction 06/02/2011  . Nonspecific elevation of levels of transaminase or lactic acid dehydrogenase (LDH) 06/02/2011  . Hypokalemia 06/02/2011  . Vitamin d deficiency 05/29/2011  . Elevated BP 05/29/2011  .  Diverticulitis 06/19/2010  . ANEMIA 11/27/2007  . ESOPHAGEAL STRICTURE 11/27/2007  . GERD 11/27/2007  . DUODENITIS 11/27/2007   Past Medical History: Past Medical History  Diagnosis Date  . Colitis   . GERD (gastroesophageal reflux disease)   . Anemia   . Adrenal nodule   . Diverticulosis   . Hx of gastritis   . History of esophageal stricture   . Choledocholithiasis with obstruction   . Diabetes mellitus     pt denies  . Hiatal hernia   . Blood transfusion without reported diagnosis   . Cataract     bil removed  . Crohn's colitis 07/11/12   Past Surgical History: Past Surgical History  Procedure Laterality Date  . Cholecystectomy  2009  . Tubal ligation    . Cataract extraction    . Ercp  06/02/2011    Procedure: ENDOSCOPIC RETROGRADE CHOLANGIOPANCREATOGRAPHY (ERCP);  Surgeon: Lafayette Dragon, MD;  Location: [facility];  Service: Endoscopy;  Laterality: N/A;  dottie /ja  . Ercp  06/02/2011    Procedure: ENDOSCOPIC RETROGRADE CHOLANGIOPANCREATOGRAPHY (ERCP);  Surgeon: Gatha Mayer, MD;  Location: [facility];  Service: Endoscopy;  Laterality: N/A;  . Colonoscopy    . Upper gastrointestinal endoscopy     Social History: History  Substance Use Topics  . Smoking status: Never Smoker   . Smokeless tobacco: Never Used  . Alcohol Use: No   Family History: Family History  Problem Relation Age of Onset  . Bone cancer Maternal Grandmother   . Colon cancer Maternal Aunt 51  . Stomach cancer Sister 70    decsd  . Breast cancer Sister   . Esophageal cancer Neg Hx   . Rectal cancer Neg Hx   Additional family Hx: CAD - Father and Son No Hx of CHF  Allergies and Medications: No Known Allergies No current facility-administered medications on file prior to encounter.   No current outpatient prescriptions on file prior to encounter.   Objective: BP 122/61  Pulse 33  Temp(Src) 97.4 F (36.3 C) (Oral)  Resp 14  Ht 5' 2"  (1.575 m)  Wt 137 lb (62.143 kg)  BMI 25.05  kg/m2  SpO2 95% Exam: General: frail, elderly female, resting in bed, NAD.  HEENT: NCAT. No scleral icterus.  Cardiovascular: RRR. No murmur appreciated. Respiratory: CTAB. No rales, rhonchi or wheezing.  Abdomen: soft, tender to palpation in the epigastrium.  Mild guarding.  No rebound. No palpable organomegaly. Extremities: Trace LE edema.  Skin: Warm, dry, intact. Neuro: No focal neurological deficits.  Labs and Imaging: CBC BMET   Recent Labs Lab 06/12/13 1516  WBC 9.4  HGB 13.1  HCT 38.6  PLT 257    Recent Labs Lab 06/12/13 1516  NA 135*  K 3.2*  CL 97  CO2 23  BUN 25*  CREATININE 0.74  GLUCOSE 132*  CALCIUM 8.5     POC Troponin - 0.00  Urinalysis    Component Value Date/Time   COLORURINE AMBER* 06/12/2013 1637   APPEARANCEUR CLOUDY* 06/12/2013 1637   LABSPEC 1.034* 06/12/2013 1637  PHURINE 5.5 06/12/2013 1637   GLUCOSEU NEGATIVE 06/12/2013 1637   HGBUR MODERATE* 06/12/2013 1637   BILIRUBINUR MODERATE* 06/12/2013 1637   BILIRUBINUR NEG 05/13/2013 1529   KETONESUR 40* 06/12/2013 1637   PROTEINUR 30* 06/12/2013 1637   UROBILINOGEN 1.0 06/12/2013 1637   UROBILINOGEN 0.2 05/13/2013 1529   NITRITE POSITIVE* 06/12/2013 1637   NITRITE POSITIVE 05/13/2013 1529   LEUKOCYTESUR MODERATE* 06/12/2013 1637   Dg Chest 2 View 06/12/2013  IMPRESSION: No active cardiopulmonary disease.  EKG - NSR with PAC's. No ST or T wave changes (reviewed in ED, not scanned in epic yet)  ID: Urine Culture - Pending  Coral Spikes, DO 06/12/2013, 7:38 PM PGY-2, [facility] pager: [phone], text pages welcome

## 2013-06-13 ENCOUNTER — Inpatient Hospital Stay (HOSPITAL_COMMUNITY): Payer: Medicare Other

## 2013-06-13 DIAGNOSIS — R112 Nausea with vomiting, unspecified: Secondary | ICD-10-CM

## 2013-06-13 DIAGNOSIS — K5090 Crohn's disease, unspecified, without complications: Secondary | ICD-10-CM

## 2013-06-13 DIAGNOSIS — R0609 Other forms of dyspnea: Secondary | ICD-10-CM

## 2013-06-13 DIAGNOSIS — I369 Nonrheumatic tricuspid valve disorder, unspecified: Secondary | ICD-10-CM

## 2013-06-13 DIAGNOSIS — N390 Urinary tract infection, site not specified: Secondary | ICD-10-CM

## 2013-06-13 DIAGNOSIS — R0989 Other specified symptoms and signs involving the circulatory and respiratory systems: Secondary | ICD-10-CM

## 2013-06-13 DIAGNOSIS — R079 Chest pain, unspecified: Secondary | ICD-10-CM

## 2013-06-13 DIAGNOSIS — R197 Diarrhea, unspecified: Secondary | ICD-10-CM

## 2013-06-13 LAB — BASIC METABOLIC PANEL
BUN: 17 mg/dL (ref 6–23)
CHLORIDE: 103 meq/L (ref 96–112)
CO2: 19 meq/L (ref 19–32)
Calcium: 8 mg/dL — ABNORMAL LOW (ref 8.4–10.5)
Creatinine, Ser: 0.65 mg/dL (ref 0.50–1.10)
GFR calc non Af Amer: 89 mL/min — ABNORMAL LOW (ref >90)
Glucose, Bld: 177 mg/dL — ABNORMAL HIGH (ref 70–99)
Potassium: 3.6 mEq/L — ABNORMAL LOW (ref 3.7–5.3)
Sodium: 138 mEq/L (ref 137–147)

## 2013-06-13 LAB — CBC
HCT: 35.2 % — ABNORMAL LOW (ref 36.0–46.0)
Hemoglobin: 11.7 g/dL — ABNORMAL LOW (ref 12.0–15.0)
MCH: 27.1 pg (ref 26.0–34.0)
MCHC: 33.2 g/dL (ref 30.0–36.0)
MCV: 81.7 fL (ref 78.0–100.0)
PLATELETS: 253 10*3/uL (ref 150–400)
RBC: 4.31 MIL/uL (ref 3.87–5.11)
RDW: 14.7 % (ref 11.5–15.5)
WBC: 8.2 10*3/uL (ref 4.0–10.5)

## 2013-06-13 LAB — LIPID PANEL
Cholesterol: 113 mg/dL (ref 0–200)
HDL: 14 mg/dL — ABNORMAL LOW (ref >39)
LDL Cholesterol: 74 mg/dL (ref 0–99)
Total CHOL/HDL Ratio: 8.1 RATIO
Triglycerides: 123 mg/dL (ref <150)
VLDL: 25 mg/dL (ref 0–40)

## 2013-06-13 LAB — LIPASE, BLOOD: LIPASE: 30 U/L (ref 11–59)

## 2013-06-13 LAB — TROPONIN I
Troponin I: <0.3 ng/mL (ref <0.30)
Troponin I: <0.3 ng/mL (ref <0.30)

## 2013-06-13 LAB — CREATININE, SERUM
Creatinine, Ser: 0.66 mg/dL (ref 0.50–1.10)
GFR calc Af Amer: >90 mL/min (ref >90)
GFR, EST NON AFRICAN AMERICAN: 88 mL/min — AB (ref >90)

## 2013-06-13 LAB — HEMOGLOBIN A1C
Hgb A1c MFr Bld: 6.1 % — ABNORMAL HIGH (ref <5.7)
Mean Plasma Glucose: 128 mg/dL — ABNORMAL HIGH (ref <117)

## 2013-06-13 LAB — TSH: TSH: 0.727 u[IU]/mL (ref 0.350–4.500)

## 2013-06-13 LAB — PRO B NATRIURETIC PEPTIDE: PRO B NATRI PEPTIDE: 94.6 pg/mL (ref 0–125)

## 2013-06-13 MED ORDER — IOHEXOL 350 MG/ML SOLN
80.0000 mL | Freq: Once | INTRAVENOUS | Status: AC | PRN
  Administered 2013-06-13: 80 mL via INTRAVENOUS

## 2013-06-13 MED ORDER — SODIUM CHLORIDE 0.9 % IV SOLN
INTRAVENOUS | Status: DC
  Administered 2013-06-13 (×2): via INTRAVENOUS
  Administered 2013-06-14: 1000 mL via INTRAVENOUS
  Administered 2013-06-14: 18:00:00 via INTRAVENOUS

## 2013-06-13 NOTE — Consult Note (Signed)
Cardiology Consult Note   Patient ID: Diane Freeman MRN: [id number], DOB/AGE: 1946-01-12   Admit date: 06/12/2013 Date of Consult: 06/13/2013  Primary Physician: [name], MD Primary Cardiologist: New  Reason for consult: chest pain  HPI: Diane Freeman is a 68 y.o. female w/ no prior cardiac history PMHx s/f Crohn's disease, h/o gastritis, hiatal hernia, GERD, diverticulosis, prior GIB and prior h/o HLD who was observed at [facility] overnight for chest pain.   She has been experiencing nausea, vomiting and diarrhea for the past 2 weeks. She denies bloody stool or hematemesis. This is consistent with prior episodes of acute Crohn's disease.   She reports no prior cardiac history. Denies prior stress testing, cath or cardiac surgery. Reports father had a MI (age unknown). Son with bypass in his 45s. No personal tobacco abuse. Denies HTN. LDL 177 in 05/2011. She reports experiencing fleeting episodes of sharp, L-sided chest pain over the past year. These episodes have become more frequent over the last month, and last week. Aggravating factors include exertion, palpation and deep inspiration. Alleviating factors include rest. She does note radiation to her jaw. She reports associated dyspnea. Has been nauseated recently from GI issues. She notes chronic 2-3 pillow orthopnea. She denies diaphoresis, PND, LE edema, weight gain, palpitations, lightheadedness or syncope.   She presented to her PCP w/ these symptoms and was transported to the ED.  There, EKG revealed LAD and diffuse, nonspecific ST changes (<0.34m depression). Initial TnI WNL. Pro BNP WNL. CXR- no active cardiopulmonary disease. No mention of cardiomegaly. She was observed for formal rule.  Two subsequent troponins returned WNL overnight. Lipid panel- LDL 74, HDL 14, TG 123, TC 113.    Cardiology has been consulted for further evaluation/management.  Problem List: Past Medical History  Diagnosis Date  . Colitis    . GERD (gastroesophageal reflux disease)   . Anemia   . Adrenal nodule   . Diverticulosis   . Hx of gastritis   . History of esophageal stricture   . Choledocholithiasis with obstruction   . Diabetes mellitus     pt denies  . Hiatal hernia   . Blood transfusion without reported diagnosis   . Cataract     bil removed  . Crohn's colitis 07/11/12    Past Surgical History  Procedure Laterality Date  . Cholecystectomy  2009  . Tubal ligation    . Cataract extraction    . Ercp  06/02/2011    Procedure: ENDOSCOPIC RETROGRADE CHOLANGIOPANCREATOGRAPHY (ERCP);  Surgeon: DLafayette Dragon MD;  [facility];  Service: Endoscopy;  Laterality: N/A;  dottie /ja  . Ercp  06/02/2011    Procedure: ENDOSCOPIC RETROGRADE CHOLANGIOPANCREATOGRAPHY (ERCP);  Surgeon: CGatha Mayer MD;  [facility];  Service: Endoscopy;  Laterality: N/A;  . Colonoscopy    . Upper gastrointestinal endoscopy       Allergies: No Known Allergies  Home Medications: Prior to Admission medications   Medication Sig Start Date End Date Taking? Authorizing Provider  folic acid (FOLVITE) 1 MG tablet Take 1 mg by mouth daily.   Yes Historical Provider, MD  omeprazole (PRILOSEC) 20 MG capsule Take 20 mg by mouth daily.   Yes Historical Provider, MD    Inpatient Medications:  . aspirin EC  81 mg Oral Daily  . cefTRIAXone (ROCEPHIN)  IV  1 g Intravenous Q24H  . enoxaparin (LOVENOX) injection  40 mg Subcutaneous Q24H  . pantoprazole (PROTONIX) IV  40 mg Intravenous Q24H  .  predniSONE  50 mg Oral Q breakfast  . sodium chloride  3 mL Intravenous Q12H   Prescriptions prior to admission  Medication Sig Dispense Refill  . folic acid (FOLVITE) 1 MG tablet Take 1 mg by mouth daily.      Marland Kitchen omeprazole (PRILOSEC) 20 MG capsule Take 20 mg by mouth daily.        Family History  Problem Relation Age of Onset  . Bone cancer Maternal Grandmother   . Colon cancer Maternal Aunt 39  . Stomach cancer Sister 35    decsd   . Breast cancer Sister   . Esophageal cancer Neg Hx   . Rectal cancer Neg Hx      History   Social History  . Marital Status: Divorced    Spouse Name: N/A    Number of Children: 2  . Years of Education: N/A   Occupational History  . Not on file.   Social History Main Topics  . Smoking status: Never Smoker   . Smokeless tobacco: Never Used  . Alcohol Use: No  . Drug Use: No  . Sexual Activity: Not on file   Other Topics Concern  . Not on file   Social History Narrative  . No narrative on file     Review of Systems: General:  negative for chills, fever, night sweats or weight changes.  Cardiovascular: positive for chest pain, shortness of breath, negative for edema, palpitations, paroxysmal nocturnal dyspnea Dermatological: negative for rash Respiratory:  negative for cough or wheezing Urologic: negative for hematuria Abdominal: positive for nausea, vomiting, diarrhea, negative for bright red blood per rectum, melena, or hematemesis Neurologic: negative for visual changes, syncope, or dizziness All other systems reviewed and are otherwise negative except as noted above.  Physical Exam: Blood pressure 123/68, pulse 101, temperature 98.3 F (36.8 C), temperature source Oral, resp. rate 18, height 5' 2"  (1.575 m), weight 63.504 kg (140 lb), SpO2 95.00%.    General: Well developed, well nourished, in no acute distress. Head: Normocephalic, atraumatic, sclera non-icteric, no xanthomas, nares are without discharge.  Neck: Negative for carotid bruits. JVD not elevated. Lungs: Clear bilaterally to auscultation without wheezes, rales, or rhonchi. Breathing is unlabored. Heart: RRR with S1 S2. No murmurs, rubs, or gallops appreciated. Abdomen: Soft, diffusely tender to palpation, non-distended with hyperactive bowel sounds. No hepatomegaly. No rebound/guarding. No obvious abdominal masses. Msk:  Strength and tone appears normal for age. Extremities: No clubbing, cyanosis or  edema.  Distal pedal pulses are 2+ and equal bilaterally. Neuro: Alert and oriented X 3. Moves all extremities spontaneously. Psych:  Responds to questions appropriately with a normal affect.  Labs: Recent Labs     06/12/13  2259  06/13/13  0910  WBC  7.2  8.2  HGB  11.6*  11.7*  HCT  34.9*  35.2*  MCV  81.4  81.7  PLT  244  253   Recent Labs Lab 06/12/13 1516 06/12/13 2259 06/13/13 0910  NA 135*  --  138  K 3.2*  --  3.6*  CL 97  --  103  CO2 23  --  19  BUN 25*  --  17  CREATININE 0.74 0.66 0.65  CALCIUM 8.5  --  8.0*  PROT 6.4  --   --   BILITOT 0.7  --   --   ALKPHOS 127*  --   --   ALT 15  --   --   AST 10  --   --  LIPASE  --  30  --   GLUCOSE 132*  --  177*   Recent Labs     06/12/13  2259  06/13/13  0150  06/13/13  0910  TROPONINI  <0.30  <0.30  <0.30   Recent Labs     06/12/13  2259  CHOL  113  HDL  14*  LDLCALC  74  TRIG  123  CHOLHDL  8.1   Radiology/Studies: Dg Chest 2 View  06/12/2013   CLINICAL DATA:  Left-sided chest pain  EXAM: CHEST  2 VIEW  COMPARISON:  05/29/2011  FINDINGS: Small hiatal hernia is noted. The cardiac shadow is stable. The lungs are clear bilaterally. No acute bony abnormality is seen.  IMPRESSION: No active cardiopulmonary disease.   Electronically Signed   By: Inez Catalina M.D.   On: 06/12/2013 17:23  Echo:  06/13/13 ------------------------------------------------------------ Study Conclusions  - Left ventricle: The cavity size was normal. Wall thickness was normal. Systolic function was normal. The estimated ejection fraction was in the range of 60% to 65%. Wall motion was normal; there were no regional wall motion abnormalities. Doppler parameters are consistent with abnormal left ventricular relaxation (grade 1 diastolic dysfunction). - Tricuspid valve: Mild-moderate regurgitation. - Pulmonary arteries: PA peak pressure: 76m Hg (S). 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 M-mode, complete 2D, spectral Doppler, and color Doppler. Height: Height: 157.5cm. Height: 62in. Weight: Weight: 63.5kg. Weight: 139.7lb. Body mass index: BMI: 25.6kg/m^2. Body surface area: BSA: 1.678m. Blood pressure: 123/68. Patient status: Inpatient.  ------------------------------------------------------------  ------------------------------------------------------------ Left ventricle: The cavity size was normal. Wall thickness was normal. Systolic function was normal. The estimated ejection fraction was in the range of 60% to 65%. Wall motion was normal; there were no regional wall motion abnormalities. Doppler parameters are consistent with abnormal left ventricular relaxation (grade 1 diastolic dysfunction).  ------------------------------------------------------------ Aortic valve: Structurally normal valve. Cusp separation was normal. Doppler: Transvalvular velocity was within the normal range. There was no stenosis. No regurgitation.  ------------------------------------------------------------ Aorta: The aorta was normal, not dilated, and non-diseased.  ------------------------------------------------------------ Mitral valve: Structurally normal valve. Leaflet separation was normal. Doppler: Transvalvular velocity was within the normal range. There was no evidence for stenosis. Trivial regurgitation.  ------------------------------------------------------------ Left atrium: The atrium was normal in size.  ------------------------------------------------------------ Right ventricle: In the apex of the right ventricle there appears to be a thrombus or mass adjacent to the moderator band measuring 2x2 cm. The apex of the right ventricle appears to be hypokinetic.  ------------------------------------------------------------ Pulmonic valve: Structurally normal valve. Cusp separation was normal. Doppler: Transvalvular velocity  was within the normal range. No regurgitation.  ------------------------------------------------------------ Tricuspid valve: Doppler: Mild-moderate regurgitation.  ------------------------------------------------------------ Right atrium: The atrium was normal in size.  ------------------------------------------------------------ Pericardium: There was no pericardial effusion.  ------------------------------------------------------------ Post procedure conclusions Ascending Aorta:  - The aorta was normal, not dilated, and non-diseased.   EKG: NSR, 97 bpm, LAD, diffuse nonspecific (<0.5 mm ST depressions), no TW changes  ASSESSMENT AND PLAN:   1. Atypical chest pain 2. History of hyperlipidemia 3. Chrohn's disease 4. Hiatal hernia 5. GERD 6. H/o gastritis  The description of her chest pain contains both mostly atypical, with some typical features. The patient reports experiencing intermittent, sharp, L-sided chest pain which has been occurring for ~ 1 year. It has increased in frequency and intensity over the past month with occasional radiation to her jaw. She reports aggravation on exertion, relief with rest; but, also aggravation on  chest wall palpation (appreciated on exam) and inspiration. Patient also has signif GI symptoms (N/V, retching)  May contrib to CW pain  Family members also comment on her dyspnea with exertion.  Objectively, EKG reveals nonspecific changes. Exam is remarkable for diffuse abdominal tenderness. Mild chest tenderness  Labs are signf for nml troponins. I have reviewed echo which was just done  RV appears dilated and mild/moderate hypokinetic at apex.  There is an echobright region at apex that may represent prominent musculature, cannot r/o mass.  LVEF is grossly normal.  I would recomm CT angio of chest to r/o PE given all of above.  If negative will need to discuss further testing  Signed, R. Valeria Batman, PA-C 06/13/2013, 11:25 AM  Patient  seen and examined  I have amended note based on my findings.    Dorris Carnes

## 2013-06-13 NOTE — Progress Notes (Signed)
Family Medicine Teaching Service Daily Progress Note Intern Pager: [phone]  Patient name: Diane Freeman Medical record number: [id number] Date of birth: 07/24/1945 Age: 68 y.o. Gender: female  Primary Care Provider: [name], MD Consultants: Cardiology, GI Code Status: Full code  Pt Overview and Major Events to Date:  3/13 - Admitted 3/14 - Troponin neg x 2; Cardiology consulted  Assessment and Plan: Diane Freeman is a 68 y.o. female presenting with chest pain and Crohn's flare. PMH is significant for Crohn's disease, GERD, and recent UTI.   # Chest pain - History consistent with angina; with recent worsening concerning for UA.  EKG non-ischemic. CAD risk factors: Family Hx; HLD (LDL of 177 and HDL of 35 in 2013).  - Troponin negative x 2.  EKG non-ischemic this am. - Given history, will consult cardiology this am for evaluation and consideration of stress test during admission - Risk Stratification labs: A1C and TSH pending. Lipid panel significant for low HDL (14) and LDL of 74. - ASA 81 mg daily  - Morphine PRN for severe pain, PRN SL Nitro   # Crohn's disease exacerbation/flare - Patient with recent increasing abdominal pain, diarrhea, nausea, vomiting and decreased PO intake.  - Clear liquid diet  - IV fluids - NS; BMP today.  - PO Prednisone 50 mg daily  - GI consult today  # Hx of HLD  - Lipid panel significant for low HDL (14) and LDL of 74. - Given results will hold off on statin  # GERD  - Protonix daily   # ? UTI  - UA was done in the ED and patient was started on empiric Rocephin given UA findings  - However, urinalysis revealed many squamous cells consistent with dirty catch. Patient no dysuria. Will await urine culture results.   FEN/GI: Clear liquid diet, Fluids as above  Prophylaxis: Lovenox   Disposition: Pending clinical improvement.   Subjective:  Reports abdominal pain and continued diarrhea this am. No current chest  pain.  Objective: Temp:  [97.4 F (36.3 C)-100.1 F (37.8 C)] 100.1 F (37.8 C) (03/14 0500) Pulse Rate:  [33-109] 101 (03/14 0500) Resp:  [14-28] 18 (03/14 0500) BP: (105-140)/(47-78) 123/68 mmHg (03/14 0500) SpO2:  [70 %-100 %] 99 % (03/14 0500) Weight:  [137 lb (62.143 kg)-140 lb (63.504 kg)] 140 lb (63.504 kg) (03/13 1947)  Physical Exam: General: frail, elderly female, resting in bed, NAD. Cardiovascular: RRR. No murmur appreciated. Respiratory: CTAB. No rales, rhonchi or wheezing.  Abdomen: soft, tender to palpation in the epigastrium. No rebound. No palpable organomegaly. Extremities: No LE edema  Laboratory:  Recent Labs Lab 06/10/13 1400 06/12/13 1516 06/12/13 2259  WBC  --  9.4 7.2  HGB 13.1 13.1 11.6*  HCT  --  38.6 34.9*  PLT  --  257 244    Recent Labs Lab 06/12/13 1516 06/12/13 2259  NA 135*  --   K 3.2*  --   CL 97  --   CO2 23  --   BUN 25*  --   CREATININE 0.74 0.66  CALCIUM 8.5  --   PROT 6.4  --   BILITOT 0.7  --   ALKPHOS 127*  --   ALT 15  --   AST 10  --   GLUCOSE 132*  --    Urinalysis    Component Value Date/Time   COLORURINE AMBER* 06/12/2013 1637   APPEARANCEUR CLOUDY* 06/12/2013 1637   LABSPEC 1.034* 06/12/2013 1637   PHURINE 5.5  06/12/2013 1637   GLUCOSEU NEGATIVE 06/12/2013 1637   HGBUR MODERATE* 06/12/2013 1637   BILIRUBINUR MODERATE* 06/12/2013 1637   BILIRUBINUR NEG 05/13/2013 1529   KETONESUR 40* 06/12/2013 1637   PROTEINUR 30* 06/12/2013 1637   UROBILINOGEN 1.0 06/12/2013 1637   UROBILINOGEN 0.2 05/13/2013 1529   NITRITE POSITIVE* 06/12/2013 1637   NITRITE POSITIVE 05/13/2013 1529   LEUKOCYTESUR MODERATE* 06/12/2013 1637   Imaging/Diagnostic Tests:  Dg Chest 2 View  06/12/2013 IMPRESSION: No active cardiopulmonary disease.  Troponin negative x 2  BNP    Component Value Date/Time   PROBNP 94.6 06/12/2013 2259   ID:  Urine Culture - Pending  Coral Spikes, DO 06/13/2013, 7:57 AM PGY-2, [place] Intern pager: [phone], text pages welcome

## 2013-06-13 NOTE — Progress Notes (Signed)
Reviewed and agree.

## 2013-06-13 NOTE — Progress Notes (Signed)
UR completed 

## 2013-06-13 NOTE — Progress Notes (Signed)
INITIAL NUTRITION ASSESSMENT  DOCUMENTATION CODES Per approved criteria  -Not Applicable   INTERVENTION: - Diet advancement per MD - Resource Breeze BID - Unit RD to continue to monitor   NUTRITION DIAGNOSIS: Inadequate oral intake related to clear liquid diet as evidenced by diet order.    Goal: Advance diet as tolerated to regular diet  Monitor:  Weights, labs, diet advancement  Reason for Assessment: Malnutrition screening tool  68 y.o. female  Admitting Dx: Abdominal pain, N/V/D, Chest pain   ASSESSMENT: Pt presenting with chest pain and Crohn's flare. PMH is significant for Crohn's disease, GERD, and recent UTI. Reported having nausea/vomiting/diarrhea x 2 weeks PTA. States she's lost 15 pounds due to minimal intake during this time frame. Denies any of those symptoms today and reports she has been tolerating clear liquid diet. Usually eats 3 meals/day with good appetite.   Potassium low, getting oral replacement Alk phos elevated  Height: Ht Readings from Last 1 Encounters:  06/12/13 5' 2"  (1.575 m)    Weight: Wt Readings from Last 1 Encounters:  06/12/13 140 lb (63.504 kg)    Ideal Body Weight: 110 lb   % Ideal Body Weight: 127%  Wt Readings from Last 10 Encounters:  06/12/13 140 lb (63.504 kg)  06/12/13 137 lb (62.143 kg)  06/10/13 137 lb (62.143 kg)  05/13/13 147 lb (66.679 kg)  11/18/12 157 lb 3.2 oz (71.305 kg)  08/18/12 158 lb 4 oz (71.782 kg)  07/09/12 161 lb (73.029 kg)  07/08/12 160 lb (72.576 kg)  02/05/12 164 lb 8 oz (74.617 kg)  09/21/11 165 lb (74.844 kg)    Usual Body Weight: 160 lb per pt  % Usual Body Weight: 87%  BMI:  Body mass index is 25.6 kg/(m^2).  Estimated Nutritional Needs: Kcal: 6063-0160 Protein: 65-80g Fluid: 1.3-1.5L/day  Skin: Intact   Diet Order: Clear Liquid  EDUCATION NEEDS: -No education needs identified at this time   Intake/Output Summary (Last 24 hours) at 06/13/13 1559 Last data filed at  06/13/13 0500  Gross per 24 hour  Intake   1050 ml  Output      2 ml  Net   1048 ml    Last BM: 3/13  Labs:   Recent Labs Lab 06/12/13 1516 06/12/13 2259 06/13/13 0910  NA 135*  --  138  K 3.2*  --  3.6*  CL 97  --  103  CO2 23  --  19  BUN 25*  --  17  CREATININE 0.74 0.66 0.65  CALCIUM 8.5  --  8.0*  GLUCOSE 132*  --  177*    CBG (last 3)  No results found for this basename: GLUCAP,  in the last 72 hours  Scheduled Meds: . aspirin EC  81 mg Oral Daily  . cefTRIAXone (ROCEPHIN)  IV  1 g Intravenous Q24H  . enoxaparin (LOVENOX) injection  40 mg Subcutaneous Q24H  . pantoprazole (PROTONIX) IV  40 mg Intravenous Q24H  . predniSONE  50 mg Oral Q breakfast  . sodium chloride  3 mL Intravenous Q12H    Continuous Infusions: . sodium chloride 100 mL/hr at 06/13/13 1027    Past Medical History  Diagnosis Date  . Colitis   . GERD (gastroesophageal reflux disease)   . Anemia   . Adrenal nodule   . Diverticulosis   . Hx of gastritis   . History of esophageal stricture   . Choledocholithiasis with obstruction   . Diabetes mellitus     pt  denies  . Hiatal hernia   . Blood transfusion without reported diagnosis   . Cataract     bil removed  . Crohn's colitis 07/11/12    Past Surgical History  Procedure Laterality Date  . Cholecystectomy  2009  . Tubal ligation    . Cataract extraction    . Ercp  06/02/2011    Procedure: ENDOSCOPIC RETROGRADE CHOLANGIOPANCREATOGRAPHY (ERCP);  Surgeon: Lafayette Dragon, MD;  Location[facility];  Service: Endoscopy;  Laterality: N/A;  dottie /ja  . Ercp  06/02/2011    Procedure: ENDOSCOPIC RETROGRADE CHOLANGIOPANCREATOGRAPHY (ERCP);  Surgeon: Gatha Mayer, MD;  Location: [facility];  Service: Endoscopy;  Laterality: N/A;  . Colonoscopy    . Upper gastrointestinal endoscopy      Mikey College MS, RD, LDN [phone] Weekend/After Hours Pager

## 2013-06-13 NOTE — Progress Notes (Signed)
  Echocardiogram 2D Echocardiogram has been performed.  Diane Freeman 06/13/2013, 1:42 PM

## 2013-06-13 NOTE — Progress Notes (Signed)
FMTS Attending Daily Note: Diane Mcmurray MD [phone] pager office [phone] I  have seen and examined this patient, reviewed their chart. I have discussed this patient with the resident. I agree with the resident's findings, assessment and care plan. Cardiology recommends lexi scan--will follow their res. She seems comfortable at rest. Hgb 11.7 which is down a little from her baseline of 12 ish but not significant. Follow. Has had no rectal bleeding with her Crohn;s disease flair.

## 2013-06-13 NOTE — H&P (Signed)
FMTS Attending Admission Note: Deavion Dobbs MD [phone] pager office [phone] I  have seen and examined this patient, reviewed their chart. I have discussed this patient with the resident. I agree with the resident's findings, assessment and care plan. 

## 2013-06-14 DIAGNOSIS — I509 Heart failure, unspecified: Secondary | ICD-10-CM

## 2013-06-14 DIAGNOSIS — I5030 Unspecified diastolic (congestive) heart failure: Secondary | ICD-10-CM

## 2013-06-14 HISTORY — DX: Unspecified diastolic (congestive) heart failure: I50.30

## 2013-06-14 LAB — BASIC METABOLIC PANEL
BUN: 14 mg/dL (ref 6–23)
CO2: 19 meq/L (ref 19–32)
CREATININE: 0.58 mg/dL (ref 0.50–1.10)
Calcium: 7.9 mg/dL — ABNORMAL LOW (ref 8.4–10.5)
Chloride: 103 mEq/L (ref 96–112)
GFR calc Af Amer: >90 mL/min (ref >90)
GFR calc non Af Amer: >90 mL/min (ref >90)
Glucose, Bld: 156 mg/dL — ABNORMAL HIGH (ref 70–99)
POTASSIUM: 3.7 meq/L (ref 3.7–5.3)
Sodium: 136 mEq/L — ABNORMAL LOW (ref 137–147)

## 2013-06-14 LAB — CBC
HCT: 33.4 % — ABNORMAL LOW (ref 36.0–46.0)
Hemoglobin: 11.1 g/dL — ABNORMAL LOW (ref 12.0–15.0)
MCH: 27.1 pg (ref 26.0–34.0)
MCHC: 33.2 g/dL (ref 30.0–36.0)
MCV: 81.7 fL (ref 78.0–100.0)
PLATELETS: 236 10*3/uL (ref 150–400)
RBC: 4.09 MIL/uL (ref 3.87–5.11)
RDW: 14.8 % (ref 11.5–15.5)
WBC: 5.7 10*3/uL (ref 4.0–10.5)

## 2013-06-14 LAB — URINE CULTURE: SPECIAL REQUESTS: NORMAL

## 2013-06-14 MED ORDER — SODIUM CHLORIDE 0.9 % IJ SOLN: 3.0000 mL | INTRAMUSCULAR | Status: DC | PRN

## 2013-06-14 MED ORDER — SODIUM CHLORIDE 0.9 % IV SOLN
INTRAVENOUS | Status: DC
  Administered 2013-06-15: 04:00:00 via INTRAVENOUS

## 2013-06-14 MED ORDER — SODIUM CHLORIDE 0.9 % IJ SOLN: 3.0000 mL | Freq: Two times a day (BID) | INTRAMUSCULAR | Status: DC

## 2013-06-14 MED ORDER — ASPIRIN EC 81 MG PO TBEC: 81.0000 mg | DELAYED_RELEASE_TABLET | Freq: Every day | ORAL | Status: DC

## 2013-06-14 MED ORDER — ASPIRIN 81 MG PO CHEW
81.0000 mg | CHEWABLE_TABLET | ORAL | Status: AC
  Administered 2013-06-15: 81 mg via ORAL
  Filled 2013-06-14: qty 1

## 2013-06-14 MED ORDER — SODIUM CHLORIDE 0.9 % IV SOLN: 250.0000 mL | INTRAVENOUS | Status: DC | PRN

## 2013-06-14 NOTE — Progress Notes (Signed)
Family Medicine Teaching Service Daily Progress Note Intern Pager: [phone]  Patient name: Diane Freeman Medical record number: [id number] Date of birth: May 17, 1945 Age: 68 y.o. Gender: female  Primary Care Provider: [name], MD Consultants: Cardiology, GI Code Status: Full code  Pt Overview and Major Events to Date:  3/13 - Admitted 3/14 - Troponin neg x 3; Cardiology consulted  Assessment and Plan: Diane Freeman is a 68 y.o. female presenting with chest pain and Crohn's flare. PMH is significant for Crohn's disease, GERD, and recent UTI.   # Chest pain - History consistent with angina; with recent worsening concerning for UA.  EKG non-ischemic. CAD risk factors: Family Hx; HLD (LDL of 177 and HDL of 35 in 2013).  - Troponin negative x 3.  EKG non-ischemic this am. - Cardiology following, saw yesterday and recommended CTA chest to r/o PE (neg for PE but possible ASD), cardiology planning for RHC & LHC tomorrow morning - Risk Stratification labs: A1C 6.1 and TSH normal. Lipid panel significant for low HDL (14) and LDL of 74. - ASA 81 mg daily  - Morphine PRN for severe pain, PRN SL Nitro  - Echo showed mild-mod tricuspid regurg, RV apex mass vs thrombus, EF 50-38, grade 1 diastolic dysfxn  # RV apex mass vs thrombus: not seen on CTA. However, CTA does show findings concerning for ASD. - planning for cath as above  # Crohn's disease exacerbation/flare - Patient with recent increasing abdominal pain, diarrhea, nausea, vomiting and decreased PO intake.  - will advance to bland diet, NPO at midnight for cath - IV fluids - NS @100  - await CBC & BMET from today - PO Prednisone 50 mg daily  - attempted GI consult yesterday x several times, unable to reach - CT chest showing pneumobilia, unclear if this is from prior ERCP?, abdominal exam does not focalize to RUQ  # 3 mm subpleural right upper lobe lung nodule: noted on CTA chest - as pt has never smoked, she is at low  risk for bronchogenic carcinoma, thus no follow up needed  # Hx of HLD  - Lipid panel significant for low HDL (14) and LDL of 74. - Given results will hold off on statin (consider as outpatient given 10 year ASCVD risk of 11% - although does not calculate correctly due to low HDL and total cholesterol)  # GERD  - Protonix daily   # ? UTI  - UA was done in the ED and patient was started on empiric Rocephin given UA findings  - However, urinalysis revealed many squamous cells consistent with dirty catch. Patient no dysuria. - urine culture has grown e coli, sensitivities pending, continue CTX  FEN/GI: advance to bland diet, NPO @ MN for cath, Fluids as above  Prophylaxis: Lovenox   Disposition: Pending clinical improvement, heart cath & cardiology recommendations  Subjective:  Stooled x3 overnight. Denies chest pain or SOB. Continues to have diffuse mild abdominal pain, nonfocal. Would like to eat something today.  Objective: Temp:  [97.9 F (36.6 C)-98.5 F (36.9 C)] 98.5 F (36.9 C) (03/15 0500) Pulse Rate:  [79-102] 80 (03/15 0500) Resp:  [18-20] 20 (03/15 0500) BP: (113-140)/(55-69) 140/65 mmHg (03/15 0500) SpO2:  [95 %-100 %] 100 % (03/15 0500)  Physical Exam: General: frail, elderly female, resting in bed, NAD. Cardiovascular: RRR with occasional early beat. No murmur appreciated. Respiratory: CTAB. No rales, rhonchi or wheezing.  Abdomen: soft, diffusely mildly TTP. +BS. No rebound. No palpable organomegaly. No focal  tendernses in RUQ appreciated. Extremities: No calf tenderness  Laboratory:  Recent Labs Lab 06/12/13 1516 06/12/13 2259 06/13/13 0910  WBC 9.4 7.2 8.2  HGB 13.1 11.6* 11.7*  HCT 38.6 34.9* 35.2*  PLT 257 244 253    Recent Labs Lab 06/12/13 1516 06/12/13 2259 06/13/13 0910  NA 135*  --  138  K 3.2*  --  3.6*  CL 97  --  103  CO2 23  --  19  BUN 25*  --  17  CREATININE 0.74 0.66 0.65  CALCIUM 8.5  --  8.0*  PROT 6.4  --   --   BILITOT  0.7  --   --   ALKPHOS 127*  --   --   ALT 15  --   --   AST 10  --   --   GLUCOSE 132*  --  177*   Urinalysis    Component Value Date/Time   COLORURINE AMBER* 06/12/2013 1637   APPEARANCEUR CLOUDY* 06/12/2013 1637   LABSPEC 1.034* 06/12/2013 1637   PHURINE 5.5 06/12/2013 1637   GLUCOSEU NEGATIVE 06/12/2013 1637   HGBUR MODERATE* 06/12/2013 1637   BILIRUBINUR MODERATE* 06/12/2013 1637   BILIRUBINUR NEG 05/13/2013 1529   KETONESUR 40* 06/12/2013 1637   PROTEINUR 30* 06/12/2013 1637   UROBILINOGEN 1.0 06/12/2013 1637   UROBILINOGEN 0.2 05/13/2013 1529   NITRITE POSITIVE* 06/12/2013 1637   NITRITE POSITIVE 05/13/2013 1529   LEUKOCYTESUR MODERATE* 06/12/2013 1637   Imaging/Diagnostic Tests:  Dg Chest 2 View  06/12/2013 IMPRESSION: No active cardiopulmonary disease.  Troponin negative x 2  BNP    Component Value Date/Time   PROBNP 94.6 06/12/2013 2259   ID:  Urine Culture - Pending  CTA Chest 3/14: 1. No evidence of pulmonary embolism.  2. Findings suspicious for atrial septal defect. Echocardiography could confirm. No evidence of right ventricular mass to correspond to the clinical history.  3. Moderate hiatal hernia.  4. 3 mm subpleural right upper lobe lung nodule. Possibly a subpleural lymph node. If the patient is at high risk for bronchogenic carcinoma, follow-up chest CT at 1 year is recommended. If the patient is at low risk, no follow-up is needed. This recommendation follows the consensus statement: "Guidelines for Management of Small Pulmonary Nodules Detected on CT Scans: A Statement from the [facility]" as published in Radiology 2005; 237:395-400. Available online at: https://www.arnold.com/.  5. Pneumobilia, new since 05/29/2011. Incompletely imaged and of indeterminate significance. Correlate with right upper quadrant symptoms. If there acute right upper quadrant symptoms and no clinical explanation for pneumobilia, consider dedicated  abdominal imaging with CT.  Echo 3/14: - Left ventricle: The cavity size was normal. Wall thickness was normal. Systolic function was normal. The estimated ejection fraction was in the range of 60% to 65%. Wall motion was normal; there were no regional wall motion abnormalities. Doppler parameters are consistent with abnormal left ventricular relaxation (grade 1 diastolic dysfunction). - Tricuspid valve: Mild-moderate regurgitation. - Pulmonary arteries: PA peak pressure: 30m Hg (S). - Apparent right ventricular mass or thrombus at apex 2x2 cm   BLeeanne Rio MD 06/14/2013, 8:25 AM PGY-2, COkobojiIntern pager: [phone] text pages welcome

## 2013-06-14 NOTE — Progress Notes (Signed)
FMTS Attending Daily Note: Dorcas Mcmurray MD [phone] pager office [phone] I  have seen and examined this patient, reviewed their chart. I have discussed this patient with the resident. I agree with the resident's findings, assessment and care plan. Doing well. After [facility] report cardiology changed from Impact to cath as next diagnostic step. Cath in AM. We were unable to get in touch with her GI doctor but she had just been seen at their office for her Crohns flair and it does not seem like tehre was anything imminent they were going to add to her therapy. We will try again tomorrow AM (Monday) to let them know she is here.

## 2013-06-14 NOTE — Progress Notes (Signed)
Subjective: Breathing OK at rest.  Minimal chest pain with deep inspiration   Objective: Filed Vitals:   06/13/13 0900 06/13/13 1400 06/13/13 2125 06/14/13 0500  BP:  113/55 133/69 140/65  Pulse:  102 79 80  Temp: 98.3 F (36.8 C) 97.9 F (36.6 C) 98 F (36.7 C) 98.5 F (36.9 C)  TempSrc: Oral Oral Oral   Resp: 18 18 18 20   Height:      Weight:      SpO2: 95% 95% 100% 100%   Weight change:   Intake/Output Summary (Last 24 hours) at 06/14/13 0881 Last data filed at 06/14/13 0600  Gross per 24 hour  Intake   1958 ml  Output      0 ml  Net   1958 ml    General: Alert, awake, oriented x3, in no acute distress Neck:  JVP is normal Heart: Regular rate and rhythm, without murmurs, rubs, gallops.  Lungs: Clear to auscultation.  No rales or wheezes. Exemities:  Tr  edema.   Neuro: Grossly intact, nonfocal.  Tele:  SR Lab Results: Results for orders placed during the hospital encounter of 06/12/13 (from the past 24 hour(s))  TROPONIN I     Status: None   Collection Time    06/13/13  9:10 AM      Result Value Ref Range   Troponin I <0.30  <0.30 ng/mL  CBC     Status: Abnormal   Collection Time    06/13/13  9:10 AM      Result Value Ref Range   WBC 8.2  4.0 - 10.5 K/uL   RBC 4.31  3.87 - 5.11 MIL/uL   Hemoglobin 11.7 (*) 12.0 - 15.0 g/dL   HCT 35.2 (*) 36.0 - 46.0 %   MCV 81.7  78.0 - 100.0 fL   MCH 27.1  26.0 - 34.0 pg   MCHC 33.2  30.0 - 36.0 g/dL   RDW 14.7  11.5 - 15.5 %   Platelets 253  150 - 400 K/uL  BASIC METABOLIC PANEL     Status: Abnormal   Collection Time    06/13/13  9:10 AM      Result Value Ref Range   Sodium 138  137 - 147 mEq/L   Potassium 3.6 (*) 3.7 - 5.3 mEq/L   Chloride 103  96 - 112 mEq/L   CO2 19  19 - 32 mEq/L   Glucose, Bld 177 (*) 70 - 99 mg/dL   BUN 17  6 - 23 mg/dL   Creatinine, Ser 0.65  0.50 - 1.10 mg/dL   Calcium 8.0 (*) 8.4 - 10.5 mg/dL   GFR calc non Af Amer 89 (*) >90 mL/min   GFR calc Af Amer >90  >90 mL/min     Studies/Results: @RISRSLT24 @  Medications:  Reviewed   @PROBHOSP @  1.  CP/SOB  Patient's history is concerning  Some of chest discomfort is atypical (pleuritic) but she does report exertional dyspnea, giving out easily  SOme discomfort with exertion. Echo yesterday shows RV is larger than normal with some mild dysfunction.  There is thickening at apex, couldnot exclude mass.  Based on this I recomm a chest CT to r/o chronic PE CT showed no PE  There was some atherosclerosis noted. There did not appear to be a discrete RV mass.  ? ASD.  Review of echo images are difficult .  Thre may be a PFO/small ASD but does not appear to be large enough to cause hemodyn problems.  Given all of above I would recomm R and L heart cath with shunt series to evaluate hemodyn and anatomy.  Plan for AM   Patient understands risks and benefits and agrees to proceed.    LOS: 2 days   Dorris Carnes 06/14/2013, 8:22 AM

## 2013-06-15 ENCOUNTER — Encounter (HOSPITAL_COMMUNITY)
Admission: EM | Disposition: A | Discharge status: Home / Self Care | Payer: Self-pay | Source: Home / Self Care | Attending: Family Medicine

## 2013-06-15 DIAGNOSIS — R079 Chest pain, unspecified: Secondary | ICD-10-CM

## 2013-06-15 DIAGNOSIS — I498 Other specified cardiac arrhythmias: Secondary | ICD-10-CM

## 2013-06-15 DIAGNOSIS — R0602 Shortness of breath: Secondary | ICD-10-CM

## 2013-06-15 HISTORY — PX: LEFT AND RIGHT HEART CATHETERIZATION WITH CORONARY ANGIOGRAM: SHX5449

## 2013-06-15 LAB — POCT I-STAT 3, ART BLOOD GAS (G3+)
ACID-BASE DEFICIT: 2 mmol/L (ref 0.0–2.0)
Bicarbonate: 21.6 mEq/L (ref 20.0–24.0)
O2 Saturation: 92 %
PH ART: 7.442 (ref 7.350–7.450)
TCO2: 23 mmol/L (ref 0–100)
pCO2 arterial: 31.7 mmHg — ABNORMAL LOW (ref 35.0–45.0)
pO2, Arterial: 59 mmHg — ABNORMAL LOW (ref 80.0–100.0)

## 2013-06-15 LAB — POCT I-STAT 3, VENOUS BLOOD GAS (G3P V)
Acid-base deficit: 7 mmol/L — ABNORMAL HIGH (ref 0.0–2.0)
BICARBONATE: 18 meq/L — AB (ref 20.0–24.0)
O2 Saturation: 60 %
PH VEN: 7.347 — AB (ref 7.250–7.300)
TCO2: 19 mmol/L (ref 0–100)
pCO2, Ven: 32.8 mmHg — ABNORMAL LOW (ref 45.0–50.0)
pO2, Ven: 32 mmHg (ref 30.0–45.0)

## 2013-06-15 LAB — PROTIME-INR
INR: 1.13 (ref 0.00–1.49)
Prothrombin Time: 14.3 seconds (ref 11.6–15.2)

## 2013-06-15 LAB — GLUCOSE, CAPILLARY: GLUCOSE-CAPILLARY: 87 mg/dL (ref 70–99)

## 2013-06-15 SURGERY — LEFT AND RIGHT HEART CATHETERIZATION WITH CORONARY ANGIOGRAM
Anesthesia: LOCAL

## 2013-06-15 MED ORDER — MIDAZOLAM HCL 2 MG/2ML IJ SOLN
INTRAMUSCULAR | Status: AC
  Filled 2013-06-15: qty 2

## 2013-06-15 MED ORDER — LIDOCAINE HCL (PF) 1 % IJ SOLN
INTRAMUSCULAR | Status: AC
Start: 2013-06-15 — End: 2013-06-15
  Filled 2013-06-15: qty 30

## 2013-06-15 MED ORDER — ASPIRIN 81 MG PO TBEC: 81.0000 mg | DELAYED_RELEASE_TABLET | Freq: Every day | ORAL | Status: DC

## 2013-06-15 MED ORDER — PREDNISONE 50 MG PO TABS: 50.0000 mg | ORAL_TABLET | Freq: Every day | ORAL | Status: DC

## 2013-06-15 MED ORDER — PANTOPRAZOLE SODIUM 40 MG PO TBEC: 40.0000 mg | DELAYED_RELEASE_TABLET | Freq: Every day | ORAL | Status: DC

## 2013-06-15 MED ORDER — SODIUM CHLORIDE 0.9 % IV SOLN: 1.0000 mL/kg/h | INTRAVENOUS | Status: DC

## 2013-06-15 MED ORDER — NITROGLYCERIN 0.2 MG/ML ON CALL CATH LAB
INTRAVENOUS | Status: AC
  Filled 2013-06-15: qty 1

## 2013-06-15 MED ORDER — HEPARIN (PORCINE) IN NACL 2-0.9 UNIT/ML-% IJ SOLN
INTRAMUSCULAR | Status: AC
  Filled 2013-06-15: qty 1000

## 2013-06-15 MED ORDER — FENTANYL CITRATE 0.05 MG/ML IJ SOLN
INTRAMUSCULAR | Status: AC
  Filled 2013-06-15: qty 2

## 2013-06-15 NOTE — Progress Notes (Signed)
FMTS Attending Note  I personally saw and evaluated the patient. The plan of care was discussed with the resident team. I agree with the assessment and plan as documented by the resident.   Chest Pain - cardiac cath unremarkable, ok for discharge from cardiology standpoint Irregular heart rhythm - sinus arrythmia with PAC's, no need for anticoagulation at this time Chron's - improved with Prednisone, complete 7 day course, follow up with GI as outpatient  Anticipate discharge later today.   Dossie Arbour MD

## 2013-06-15 NOTE — Progress Notes (Signed)
The patient was seen by Dr. Burt Knack prior to [facility] shunt series.   I did not personally see the pt, but discussed with Ms. Simmons.  Leonie Man, MD

## 2013-06-15 NOTE — Discharge Summary (Signed)
[facility] Discharge Summary  Patient name: Diane Freeman Medical record number: [id number] Date of birth: 12/17/1945 Age: 68 y.o. Gender: female Date of Admission: 06/12/2013  Date of Discharge: 06/15/2013 Admitting Physician: Alveda Reasons, MD  Primary Care Provider: [name], MD Consultants: Cardiology, GI  Indication for Hospitalization: Chest pain & crohns flare  Discharge Diagnoses/Problem List:  Chest pain - non cardiac UTI Acute flare of crohns disease Lung nodule GERD HLD  Disposition: home   Discharge Condition: stable   Brief Hospital Course:  Patient is a 68 y.o. female presenting with chest pain and Crohn's flare. PMH is significant for Crohn's disease, GERD, and recent UTI. Patient's HPI for chest pain concerning for typical angina. She had neg serial troponin x3. Cardiology consulted who recommended echo which showed RV mass vs thrombus with 60-65% LV EF. Cardiology recommended follow-up CTA chest for PE evaluation which showed possible ASD. RHC & LHC on 06/15/2013 showed patent coronaries and no L to R intracardiac shunting.   For crohn's she was started on 14m prednisone to be continued x7 days. We discussed pneumobilia seen on her CTA chest with GI who felt it was ERCP related. CTA chest also showed 355msubpleural R upper lobe lung nodule which could be concerning for bronchogenic carcinoma. Further workup to be explored outpt.   She was also treated for a UTI with 3 doses of IV rocephin  Issues for Follow Up: none  Significant Procedures:  - R heart cath, L heart cath 06/15/2013 Final Conclusions:  1. 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 Normal intracardiac hemodynamics  3. Normal LV function  4. No evidence of left-to-right intracardiac shunt  Recommendations: suspect noncardiac symptoms  Significant Labs and Imaging:   Recent Labs Lab 06/12/13 2259 06/13/13 0910 06/14/13 1000  WBC 7.2  8.2 5.7  HGB 11.6* 11.7* 11.1*  HCT 34.9* 35.2* 33.4*  PLT 244 253 236    Recent Labs Lab 06/12/13 1516 06/12/13 2259 06/13/13 0910 06/14/13 1000  NA 135*  --  138 136*  K 3.2*  --  3.6* 3.7  CL 97  --  103 103  CO2 23  --  19 19  GLUCOSE 132*  --  177* 156*  BUN 25*  --  17 14  CREATININE 0.74 0.66 0.65 0.58  CALCIUM 8.5  --  8.0* 7.9*  ALKPHOS 127*  --   --   --   AST 10  --   --   --   ALT 15  --   --   --   ALBUMIN 2.2*  --   --   --    Lipid Panel     Component Value Date/Time   CHOL 113 06/12/2013 2259   TRIG 123 06/12/2013 2259   HDL 14* 06/12/2013 2259   CHOLHDL 8.1 06/12/2013 2259   VLDL 25 06/12/2013 2259   LDLCALC 74 06/12/2013 2259   Hemoglobin a1c: 6.1% Troponin I <0.3 x3 TSH: 0.727  Urinalysis    Component Value Date/Time   COLORURINE AMBER* 06/12/2013 1637   APPEARANCEUR CLOUDY* 06/12/2013 1637   LABSPEC 1.034* 06/12/2013 1637   PHURINE 5.5 06/12/2013 1637   GLUCOSEU NEGATIVE 06/12/2013 1637   HGBUR MODERATE* 06/12/2013 1637   BILIRUBINUR MODERATE* 06/12/2013 1637   BILIRUBINUR NEG 05/13/2013 1529   KETONESUR 40* 06/12/2013 1637   PROTEINUR 30* 06/12/2013 1637   UROBILINOGEN 1.0 06/12/2013 1637   UROBILINOGEN 0.2 05/13/2013 1529   NITRITE POSITIVE* 06/12/2013  [place] 05/13/2013 1529   LEUKOCYTESUR MODERATE* 06/12/2013 1637   Urine culture: >100,000 colonies E coli - pansensitive  - 2D Echocardiogram 06/13/2013: Study Conclusions - Left ventricle: The cavity size was normal. Wall thickness was normal. Systolic function was normal. The estimated ejection fraction was in the range of 60% to 65%. Wall motion was normal; there were no regional wall motion abnormalities. Doppler parameters are consistent with abnormal left ventricular relaxation (grade 1 diastolic dysfunction). - Tricuspid valve: Mild-moderate regurgitation. - Pulmonary arteries: PA peak pressure: 51m Hg (S). Impressions - Apparent right ventricular mass or thrombus at apex 2x2  cm  - CT Angio Chest 06/14/2013: IMPRESSION -  1. No evidence of pulmonary embolism.  2. Findings suspicious for atrial septal defect. Echocardiography could confirm. No evidence of right ventricular mass to correspond to the clinical history.  3. Moderate hiatal hernia.  4. 3 mm subpleural right upper lobe lung nodule. Possibly a subpleural lymph node. If the patient is at high risk for bronchogenic carcinoma, follow-up chest CT at 1 year is recommended. If the patient is at low risk, no follow-up is needed. This recommendation follows the consensus statement: "Guidelines for Management of Small Pulmonary Nodules Detected on CT Scans: A Statement from the [facility] as published in Radiology 2005; 237:395-400. Available online at: hhttps://www.arnold.com/  5. Pneumobilia, new since 05/29/2011. Incompletely imaged and of indeterminate significance. Correlate with right upper quadrant symptoms. If there acute right upper quadrant symptoms and no clinical explanation for pneumobilia, consider dedicated abdominal imaging with CT.  Results/Tests Pending at Time of Discharge: none  Discharge Medications:    Medication List         aspirin 81 MG EC tablet  Take 1 tablet (81 mg total) by mouth daily.     folic acid 1 MG tablet  Commonly known as:  FOLVITE  Take 1 mg by mouth daily.     omeprazole 20 MG capsule  Commonly known as:  PRILOSEC  Take 20 mg by mouth daily.     predniSONE 50 MG tablet  Commonly known as:  DELTASONE  Take 1 tablet (50 mg total) by mouth daily with breakfast.       Discharge Instructions: Please refer to Patient Instructions section of EMR for full details.  Patient was counseled important signs and symptoms that should prompt return to medical care, changes in medications, dietary instructions, activity restrictions, and follow up appointments.   Follow-Up Appointments: Follow-up Information   Follow up with STommi Rumps MD On 06/26/2013. (at 2:15 for hospital follow-up of lung nodule and crohn's flare)    Specialty:  Family Medicine   Contact information:   [phone]      Follow up with PTye Savoy NP On 06/23/2013. (at 9:30 for hospital follow-up of Crohns flare)    Specialty:  Nurse Practitioner   Contact information:   [phone]       STimmothy Euler MD 06/16/2013, 9:01 AM CPearson

## 2013-06-15 NOTE — Interval H&P Note (Signed)
History and Physical Interval Note:  06/15/2013 12:46 PM  Diane Freeman  has presented today for surgery, with the diagnosis of cp  The various methods of treatment have been discussed with the patient and family. After consideration of risks, benefits and other options for treatment, the patient has consented to  Procedure(s): LEFT AND RIGHT HEART CATHETERIZATION WITH CORONARY ANGIOGRAM- Shunt series (N/A) as a surgical intervention .  The patient's history has been reviewed, patient examined, no change in status, stable for surgery.  I have reviewed the patient's chart and labs.  Questions were answered to the patient's satisfaction.    Cath Lab Visit (complete for each Cath Lab visit)  Clinical Evaluation Leading to the Procedure:   ACS: no  Non-ACS:    Anginal Classification: CCS III  Anti-ischemic medical therapy: Minimal Therapy (1 class of medications)  Non-Invasive Test Results: No non-invasive testing performed  Prior CABG: No previous CABG        Diane Freeman

## 2013-06-15 NOTE — CV Procedure (Signed)
    Cardiac Catheterization Procedure Note  Name: Diane Freeman MRN: [id number] DOB: April 10, 1945  Procedure: Right Heart Cath, Left Heart Cath, Selective Coronary Angiography, LV angiography  Indication: Chest pain, shortness of breath. Dilated RV question ASD.    Procedural Details: The right groin was prepped, draped, and anesthetized with 1% lidocaine. Using the modified Seldinger technique a 5 French sheath was placed in the right femoral artery and a 7 French sheath was placed in the right femoral vein. A Swan-Ganz catheter was used for the right heart catheterization. Standard protocol was followed for recording of right heart pressures and sampling of oxygen saturations. Fick cardiac output was calculated. Standard Judkins catheters were used for selective coronary angiography and left ventriculography. A multipurpose catheter and angled glidewire were used to attempt to cross the interatrial septum but this was unsuccessful. There were no immediate procedural complications. The patient was transferred to the post catheterization recovery area for further monitoring.  Procedural Findings: Hemodynamics RA 4 RV 29/6 PA 30/7 mean 17 PCWP 8 LV 141/13 AO 143/71  Oxygen saturations: PA 66 AO 92 SVC 60  Cardiac Output (Fick) 4.5  Cardiac Index (Fick) 2.7   Coronary angiography: Coronary dominance: right  Left mainstem: Widely patent without obstructive disease. Divides into the LAD and LCx.  Left anterior descending (LAD): large caliber vessel that wraps around the LV apex. There is minimal diffuse irregularity but no obstructive disease appreciated throughout the course of the LAD  Left circumflex (LCx): Large vessel without obstructive disease. There are 3 OM branches without obstructive disease.   Right coronary artery (RCA): mild diffuse irregularity with a dilated portion in the mid-vessel. No evidence of significant coronary obstruction. Dominant vessel supplying a PDA  and PLA  Left ventriculography: Left ventricular systolic function is normal, LVEF is estimated at 65%, there is no significant mitral regurgitation   Final Conclusions:   1. Patent coronary arteries with minimal nonobstructive CAD 2. Normal intracardiac hemodynamics 3. Normal LV function 4. No evidence of left-to-right intracardiac shunt  Recommendations: suspect noncardiac symptoms.   Sherren Mocha 06/15/2013, 1:24 PM

## 2013-06-15 NOTE — Progress Notes (Signed)
Patient Profile: Diane Freeman is a 68 y.o. female w/ no prior cardiac history. PMHx s/f Crohn's disease, h/o gastritis, hiatal hernia, GERD, diverticulosis, prior GIB and prior h/o HLD, who was admitted to [facility] for evaluation of chest pain + exertional dyspnea. The description of her chest pain contains mostly atypical, with some typical features. The patient reports experiencing intermittent, sharp, L-sided chest pain which has been occurring for ~ 1 year. It has increased in frequency and intensity over the past month with occasional radiation to her jaw. She reports aggravation on exertion, relief with rest, but also aggravation on chest wall palpation and inspiration.  EKG revealed LAD and diffuse, nonspecific ST changes (<0.80m depression).  Cardiac enzymes were negative x 3.  Pro BNP WNL.  CXR- no active cardiopulmonary disease.  Lipid panel- LDL 74, HDL 14, TG 123, TC 113.  CT showed no PE. There was some atherosclerosis noted. There did not appear to be a discrete RV mass. ? ASD.  2D echo suggest that there may be a PFO/small ASD but does not appear to be large enough to cause hemodyn problems.  R/LHC recommended.   Subjective: Denies any chest pain or SOB currently. She has no questions regarding her cath procedure, scheduled today.   Objective: Vital signs in last 24 hours: Temp:  [98.1 F (36.7 C)-98.3 F (36.8 C)] 98.1 F (36.7 C) (03/16 0459) Pulse Rate:  [72-88] 72 (03/16 0459) Resp:  [18-20] 18 (03/16 0459) BP: (107-136)/(65-70) 129/68 mmHg (03/16 0459) SpO2:  [95 %-100 %] 100 % (03/16 0459) Weight:  [148 lb (67.132 kg)] 148 lb (67.132 kg) (03/16 0459) Last BM Date: 06/14/13  Intake/Output from previous day: 03/15 0701 - 03/16 0700 In: 480 [P.O.:480] Out: -  Intake/Output this shift:    Medications Current Facility-Administered Medications  Medication Dose Route Frequency Provider Last Rate Last Dose  . 0.9 %  sodium chloride infusion   Intravenous  Continuous Jayce G Cook, DO      . 0.9 %  sodium chloride infusion  250 mL Intravenous PRN PFay Records MD      . 0.9 %  sodium chloride infusion   Intravenous Continuous PFay Records MD 50 mL/hr at 06/15/13 0415    . acetaminophen (TYLENOL) tablet 650 mg  650 mg Oral Q6H PRN JCoral Spikes DO       Or  . acetaminophen (TYLENOL) suppository 650 mg  650 mg Rectal Q6H PRN JCoral Spikes DO      . [START ON 06/16/2013] aspirin EC tablet 81 mg  81 mg Oral [facility]     . cefTRIAXone (ROCEPHIN) 1 g in dextrose 5 % 50 mL IVPB  1 g Intravenous Q24H Jayce G Cook, DO   1 g at 06/14/13 1745  . enoxaparin (LOVENOX) injection 40 mg  40 mg Subcutaneous Q24H JCoral Spikes DO   40 mg at 06/14/13 2149  . morphine 2 MG/ML injection 1 mg  1 mg Intravenous Q4H PRN JCoral Spikes DO   1 mg at 06/13/13 2125  . nitroGLYCERIN (NITROSTAT) SL tablet 0.4 mg  0.4 mg Sublingual Q5 min PRN JCoral Spikes DO      . ondansetron (ZOFRAN) tablet 4 mg  4 mg Oral Q6H PRN JCoral Spikes DO       Or  . ondansetron (ZOFRAN) injection 4 mg  4 mg Intravenous Q6H PRN JCoral Spikes DO   4 mg at 06/14/13  2703  . oxyCODONE (Oxy IR/ROXICODONE) immediate release tablet 5 mg  5 mg Oral Q4H PRN Coral Spikes, DO      . pantoprazole (PROTONIX) injection 40 mg  40 mg Intravenous Q24H Coral Spikes, DO   40 mg at 06/14/13 2150  . predniSONE (DELTASONE) tablet 50 mg  50 mg Oral Q breakfast Coral Spikes, DO   50 mg at 06/14/13 5009  . sodium chloride 0.9 % injection 3 mL  3 mL Intravenous Q12H Jayce G Cook, DO   3 mL at 06/14/13 1005  . sodium chloride 0.9 % injection 3 mL  3 mL Intravenous Q12H Fay Records, MD      . sodium chloride 0.9 % injection 3 mL  3 mL Intravenous PRN Fay Records, MD        PE: General appearance: alert, cooperative and no distress Lungs: clear to auscultation bilaterally Heart: regular rate and rhythm, S1, S2 normal, no murmur, click, rub or gallop Extremities: no LEE Pulses: 2+ and  symmetric Skin: warm and dry Neurologic: Grossly normal  Lab Results:   Recent Labs  06/12/13 2259 06/13/13 0910 06/14/13 1000  WBC 7.2 8.2 5.7  HGB 11.6* 11.7* 11.1*  HCT 34.9* 35.2* 33.4*  PLT 244 253 236   BMET  Recent Labs  06/12/13 1516 06/12/13 2259 06/13/13 0910 06/14/13 1000  NA 135*  --  138 136*  K 3.2*  --  3.6* 3.7  CL 97  --  103 103  CO2 23  --  19 19  GLUCOSE 132*  --  177* 156*  BUN 25*  --  17 14  CREATININE 0.74 0.66 0.65 0.58  CALCIUM 8.5  --  8.0* 7.9*   PT/INR  Recent Labs  06/12/13 1602  LABPROT 14.3  INR 1.13   Cholesterol  Recent Labs  06/12/13 2259  CHOL 113   Cardiac Panel (last 3 results)  Recent Labs  06/12/13 2259 06/13/13 0150 06/13/13 0910  TROPONINI <0.30 <0.30 <0.30    Assessment/Plan  Active Problems:   Crohn's disease   UTI (lower urinary tract infection)   Dehydration   Chest pain   Diastolic CHF  Plan: 68 y/o female with no prior cardiac history, admitted on 06/12/13 for evaluation of chest pain + exertional dyspnea. Cardiac enzymes negative x 3. CT of chest r/o PE and disection. Aortic and branch vessel atherosclerosis was noted. EKG suggest LAD and diffuse, no specific ST abnormalities. 2D echo suggest that there may be a PFO/small ASD. R/LHC has been recommended.   1. CP/ Exertional Dyspnea: [facility] series today with Dr. Burt Knack to evaluate hemodynamics and anatomy. Will check an INR in anticipation of cath. She has been NPO since midnight.    MD to follow.    LOS: 3 days    Madge Therrien M. Rosita Fire, PA-C 06/15/2013 9:12 AM

## 2013-06-15 NOTE — H&P (View-Only) (Signed)
Subjective: Breathing OK at rest.  Minimal chest pain with deep inspiration   Objective: Filed Vitals:   06/13/13 0900 06/13/13 1400 06/13/13 2125 06/14/13 0500  BP:  113/55 133/69 140/65  Pulse:  102 79 80  Temp: 98.3 F (36.8 C) 97.9 F (36.6 C) 98 F (36.7 C) 98.5 F (36.9 C)  TempSrc: Oral Oral Oral   Resp: 18 18 18 20   Height:      Weight:      SpO2: 95% 95% 100% 100%   Weight change:   Intake/Output Summary (Last 24 hours) at 06/14/13 0539 Last data filed at 06/14/13 0600  Gross per 24 hour  Intake   1958 ml  Output      0 ml  Net   1958 ml    General: Alert, awake, oriented x3, in no acute distress Neck:  JVP is normal Heart: Regular rate and rhythm, without murmurs, rubs, gallops.  Lungs: Clear to auscultation.  No rales or wheezes. Exemities:  Tr  edema.   Neuro: Grossly intact, nonfocal.  Tele:  SR Lab Results: Results for orders placed during the hospital encounter of 06/12/13 (from the past 24 hour(s))  TROPONIN I     Status: None   Collection Time    06/13/13  9:10 AM      Result Value Ref Range   Troponin I <0.30  <0.30 ng/mL  CBC     Status: Abnormal   Collection Time    06/13/13  9:10 AM      Result Value Ref Range   WBC 8.2  4.0 - 10.5 K/uL   RBC 4.31  3.87 - 5.11 MIL/uL   Hemoglobin 11.7 (*) 12.0 - 15.0 g/dL   HCT 35.2 (*) 36.0 - 46.0 %   MCV 81.7  78.0 - 100.0 fL   MCH 27.1  26.0 - 34.0 pg   MCHC 33.2  30.0 - 36.0 g/dL   RDW 14.7  11.5 - 15.5 %   Platelets 253  150 - 400 K/uL  BASIC METABOLIC PANEL     Status: Abnormal   Collection Time    06/13/13  9:10 AM      Result Value Ref Range   Sodium 138  137 - 147 mEq/L   Potassium 3.6 (*) 3.7 - 5.3 mEq/L   Chloride 103  96 - 112 mEq/L   CO2 19  19 - 32 mEq/L   Glucose, Bld 177 (*) 70 - 99 mg/dL   BUN 17  6 - 23 mg/dL   Creatinine, Ser 0.65  0.50 - 1.10 mg/dL   Calcium 8.0 (*) 8.4 - 10.5 mg/dL   GFR calc non Af Amer 89 (*) >90 mL/min   GFR calc Af Amer >90  >90 mL/min     Studies/Results: @RISRSLT24 @  Medications:  Reviewed   @PROBHOSP @  1.  CP/SOB  Patient's history is concerning  Some of chest discomfort is atypical (pleuritic) but she does report exertional dyspnea, giving out easily  SOme discomfort with exertion. Echo yesterday shows RV is larger than normal with some mild dysfunction.  There is thickening at apex, couldnot exclude mass.  Based on this I recomm a chest CT to r/o chronic PE CT showed no PE  There was some atherosclerosis noted. There did not appear to be a discrete RV mass.  ? ASD.  Review of echo images are difficult .  Thre may be a PFO/small ASD but does not appear to be large enough to cause hemodyn problems.  Given all of above I would recomm R and L heart cath with shunt series to evaluate hemodyn and anatomy.  Plan for AM   Patient understands risks and benefits and agrees to proceed.    LOS: 2 days   Dorris Carnes 06/14/2013, 8:22 AM

## 2013-06-15 NOTE — Progress Notes (Signed)
Family Medicine Teaching Service Daily Progress Note Intern Pager: [phone]  Patient name: Diane Freeman Medical record number: [id number] Date of birth: 12/28/1945 Age: 68 y.o. Gender: female  Primary Care Provider: [name], MD Consultants: Cardiology, GI Code Status: Full code  Pt Overview and Major Events to Date:  3/13 - Admitted 3/14 - Troponin neg x 3; Cardiology consulted 3/15 - Echo & CTA 3/16 - Cath scheduled this AM  Assessment and Plan: Diane Freeman is a 68 y.o. female presenting with chest pain and Crohn's flare. PMH is significant for Crohn's disease, GERD, and recent UTI.   # Chest pain - History consistent with angina; with recent worsening concerning for UA.  EKG non-ischemic. CAD risk factors: Family Hx; HLD (LDL of 177 and HDL of 35 in 2013).  - Troponin negative x 3.  EKG non-ischemic. - Cardiology following, CTA chest to r/o PE (neg for PE but possible ASD), cardiology planning for RHC & LHC this am - Risk Stratification labs: A1C 6.1 and TSH normal. Lipid panel significant for low HDL (14) and LDL of 74. - ASA 81 mg daily  - Morphine PRN for severe pain, PRN SL Nitro  - Echo showed mild-mod tricuspid regurg, RV apex mass vs thrombus, EF 72-62, grade 1 diastolic dysfxn  # RV apex mass vs thrombus: not seen on CTA. However, CTA does show findings concerning for ASD. - planning for cath as above  # Crohn's disease exacerbation/flare - Patient with recent increasing abdominal pain, diarrhea, nausea, vomiting and decreased PO intake.  - will advance to bland diet, NPO at midnight for cath - IV fluids - NS @100  - PO Prednisone 50 mg daily, on day 3 of 7 - continue to attempt GI consult to [place] - CT chest showing pneumobilia, unclear if this is from prior ERCP? lLikely ERCP related  Per discussion with GI this am, ERCP was "years" ago and gall stone removed ~1 year ago), abdominal exam does not focalize to RUQ  # Irregular heart beat - Patient with  noted on physical exam irregularly irregular heart rate this am. Telemetry checked with noted interval irregularly irregular intervals. EKGs evaluated from admission nl as well as 3/14 scanned which showed irregularly irregular rate. Possible paroxysmal afib vs PACs.  - EKG today - CHA2DS2-VASc score of 2 (age, female) - consider starting Xarelto  # 3 mm subpleural right upper lobe lung nodule: noted on CTA chest - as pt has never smoked, she is at low risk for bronchogenic carcinoma, thus no follow up needed  # Hx of HLD  - Lipid panel significant for low HDL (14) and LDL of 74. - Given results will hold off on statin (consider as outpatient given 10 year ASCVD risk of 11% - although does not calculate correctly due to low HDL and total cholesterol)  # GERD  - Protonix daily   # ? UTI  - UA was done in the ED and patient was started on empiric Rocephin given UA findings  - urinalysis with many squamous cells consistent with dirty catch. Patient no dysuria. - urine culture has grown e coli, pan-sensitive, discontinued CTX after completion of 3rd dose (today)  FEN/GI: advance to bland diet, NPO @ MN for cath, Fluids as above  Prophylaxis: Lovenox   Disposition: Pending clinical improvement, heart cath & cardiology recommendations  Subjective:  Patient's family from [facility] is in room with her this AM. Patient is ready for her heart cath today. Continues to have diffuse  mild lower abdominal pain although she states it is continuing to improve. No dysuria. Confirms her GI doc is Midwife from [facility]. Clarifies that her ERCP was several years ago and that she had a gallstone removed ~1 year ago.  Objective: Temp:  [98.1 F (36.7 C)-98.3 F (36.8 C)] 98.1 F (36.7 C) (03/16 0459) Pulse Rate:  [72-88] 72 (03/16 0459) Resp:  [18-20] 18 (03/16 0459) BP: (107-136)/(65-70) 129/68 mmHg (03/16 0459) SpO2:  [95 %-100 %] 100 % (03/16 0459) Weight:  [148 lb (67.132 kg)] 148 lb (67.132 kg)  (03/16 0459)  Physical Exam: General: elderly woman lying in bed in NAD. Cardiovascular: irregularly irregular. No murmur appreciated. Respiratory: normal WOB,  CTAB, No rales, rhonchi or wheezing.  Abdomen: soft, +BS. No rebound or guarding. nonfocal tenderness in bilateral lower quadrants Extremities: warm, well perfused with trace edema  Laboratory:  Recent Labs Lab 06/12/13 2259 06/13/13 0910 06/14/13 1000  WBC 7.2 8.2 5.7  HGB 11.6* 11.7* 11.1*  HCT 34.9* 35.2* 33.4*  PLT 244 253 236    Recent Labs Lab 06/12/13 1516 06/12/13 2259 06/13/13 0910 06/14/13 1000  NA 135*  --  138 136*  K 3.2*  --  3.6* 3.7  CL 97  --  103 103  CO2 23  --  19 19  BUN 25*  --  17 14  CREATININE 0.74 0.66 0.65 0.58  CALCIUM 8.5  --  8.0* 7.9*  PROT 6.4  --   --   --   BILITOT 0.7  --   --   --   ALKPHOS 127*  --   --   --   ALT 15  --   --   --   AST 10  --   --   --   GLUCOSE 132*  --  177* 156*   Urine Cx:  E coli >100,000 colonies Pan sensitive  Imaging/Diagnostic Tests: None in past 24 hours  Early Osmond, Med Student 06/15/2013, 9:00 AM [facility] Intern pager: [phone], text pages welcome   I have seen and examined the patient with Student Dr. Moshe Cipro and agree with his documentation above. My findings are summarized below. My annotations are indicated in blue.   S: Feeling better but still with moderate abd tenderness, clearly improved from admission however.   O: Filed Vitals:   06/15/13 0459  BP: 129/68  Pulse: 72  Temp: 98.1 F (36.7 C)  Resp: 18   Gen: NAD, alert, cooperative with exam HEENT: NCAT CV: irregularly irregular, good S1/S2, no murmur Resp: CTABL, no wheezes, non-labored Abd: Soft, tender to palaption throughout but worse in BL LQ.  Ext: 1+ pitting edema BL, 2+ DP pulses Neuro: Alert and oriented, No gross deficits  A/P 68 y/o F here with chest pain and crohn's flare. She has negative troponins X3, non specific  EKG findings, And questionable PFO/ASD on TTE and CT angio. She is going for cath today. She is being treated for a crohn's flare with a steroid burst and is slowly improving. I have discussed the pneumobilia with Azucena Freed, GI PA, who states this is a normal finding for years after an ERCP. We will plan t o continue to follow for card's reccs and dc with 7 days steroid burst with close PCP and GI follow up. Discharge today vs tomorrow pending Cath results.   Laroy Apple, MD [facility] Resident, PGY-2 06/15/2013, 11:45 AM

## 2013-06-15 NOTE — Discharge Instructions (Signed)
You have been admitted to the hospital for evaluation of your chest pain. In review, you had blood-work that looked for dead heart muscle called "troponin I" which was not found in your bloodstream. You also had an echocardiogram which is an ultrasound of your heart which showed a normal squeezing function of the muscle. This test did show a possible abnormality of your heart which prompted [place] to get a CT of your chest vessels called a "CT Angiogram." This study was not fully able to clarify whether there might have been a defect between the chambers of your heart and prompted [place] to evaluate your heart with a catheterization. After this exhaustive set of studies we have determined that your heart is perfectly normal.   Lastly, we have started to treat your Crohn's flare with a medication that you are familiar with, Prednisone, which you should continue for a total of 7 days. It is important to follow-up with both your GI appointment as well as your PCP. Both of these appointments have been scheduled for you already and are listed above.   Please take the following medicines which we have prescribed to you: 1. Prednisone 58m one time a day for 4 more days   Crohn's Disease Crohn's disease is a long-term (chronic) soreness and redness (inflammation) of the intestines (bowel). It can affect any portion of the digestive tract, from the mouth to the anus. It can also cause problems outside the digestive tract. Crohn's disease is closely related to a disease called ulcerative colitis (together, these two diseases are called inflammatory bowel disease).  CAUSES  The cause of Crohn's disease is not known. One tLink Snufferis that, in an easily affected person, the immune system is triggered to attack the body's own digestive tissue. Crohn's disease runs in families. It seems to be more common in certain geographic areas and amongst certain races. There are no clear-cut dietary causes.  SYMPTOMS  Crohn's disease can  cause many different symptoms since it can affect many different parts of the body. Symptoms include:  Fatigue.  Weight loss.  Chronic diarrhea, sometime bloody.  Abdominal pain and cramps.  Fever.  Ulcers or canker sores in the mouth or rectum.  Anemia (low red blood cells).  Arthritis, skin problems, and eye problems may occur. Complications of Crohn's disease can include:  Series of holes (perforation) of the bowel.  Portions of the intestines sticking to each other (adhesions).  Obstruction of the bowel.  Fistula formation, typically in the rectal area but also in other areas. A fistula is an opening between the bowels and the outside, or between the bowels and another organ.  A painful crack in the mucous membrane of the anus (rectal fissure). DIAGNOSIS  Your caregiver may suspect Crohn's disease based on your symptoms and an exam. Blood tests may confirm that there is a problem. You may be asked to submit a stool specimen for examination. X-rays and CT scans may be necessary. Ultimately, the diagnosis is usually made after a procedure that uses a flexible tube that is inserted via your mouth or your anus. This is done under sedation and is called either an upper endoscopy or colonoscopy. With these tests, the specialist can take tiny tissue samples and remove them from the inside of the bowel (biopsy). Examination of this biopsy tissue under a microscope can reveal Crohn's disease as the cause of your symptoms. Due to the many different forms that Crohn's disease can take, symptoms may be present for several  years before a diagnosis is made. TREATMENT  Medications are often used to decrease inflammation and control the immune system. These include medicines related to aspirin, steroid medications, and newer and stronger medications to slow down the immune system. Some medications may be used as suppositories or enemas. A number of other medications are used or have been studied.  Your caregiver will make specific recommendations. HOME CARE INSTRUCTIONS   Symptoms such as diarrhea can be controlled with medications. Avoid foods that have a laxative effect such as fresh fruit, vegetables and dairy products. During flare ups, you can rest your bowel by refraining from solid foods. Drink clear liquids frequently during the day (electrolyte or re-hydrating fluids are best. Your caregiver can help you with suggestions). Drink often to prevent loss of body fluids (dehydration). When diarrhea has cleared, eat small meals and more frequently. Avoid food additives and stimulants such as caffeine (coffee, tea, or chocolate). Enzyme supplements may help if you develop intolerance to a sugar in dairy products (lactose). Ask your caregiver or dietitian about specific dietary instructions.  Try to maintain a positive attitude. Learn relaxation techniques such as self hypnosis, mental imaging, and muscle relaxation.  If possible, avoid stresses which can aggravate your condition.  Exercise regularly.  Follow your diet.  Always get plenty of rest. SEEK MEDICAL CARE IF:   Your symptoms fail to improve after a week or two of new treatment.  You experience continued weight loss.  You have ongoing cramps or loose bowels.  You develop a new skin rash, skin sores, or eye problems. SEEK IMMEDIATE MEDICAL CARE IF:   You have worsening of your symptoms or develop new symptoms.  You have a fever.  You develop bloody diarrhea.  You develop severe abdominal pain. MAKE SURE YOU:   Understand these instructions.  Will watch your condition.  Will get help right away if you are not doing well or get worse. Document Released: 12/27/2004 Document Revised: 07/14/2012 Document Reviewed: 11/25/2006 [facility] Patient Information 2014 [place], [place].

## 2013-06-16 LAB — POCT I-STAT 3, VENOUS BLOOD GAS (G3P V)
Acid-base deficit: 3 mmol/L — ABNORMAL HIGH (ref 0.0–2.0)
Bicarbonate: 20.7 mEq/L (ref 20.0–24.0)
O2 Saturation: 66 %
PCO2 VEN: 31.5 mmHg — AB (ref 45.0–50.0)
PH VEN: 7.425 — AB (ref 7.250–7.300)
PO2 VEN: 33 mmHg (ref 30.0–45.0)
TCO2: 22 mmol/L (ref 0–100)

## 2013-06-16 NOTE — Discharge Summary (Signed)
I agree with the discharge summary as documented.   Patient to follow up with GI for her Chron's Exacerbation. She will also need follow up for 3 mm subpleural right upper lobe nodule with her PCP.   Dossie Arbour MD

## 2013-06-18 ENCOUNTER — Telehealth: Payer: Self-pay | Admitting: *Deleted

## 2013-06-18 ENCOUNTER — Telehealth: Payer: Self-pay | Admitting: Internal Medicine

## 2013-06-18 ENCOUNTER — Emergency Department (HOSPITAL_COMMUNITY): Payer: Medicare Other

## 2013-06-18 ENCOUNTER — Encounter (HOSPITAL_COMMUNITY): Payer: Self-pay | Admitting: Emergency Medicine

## 2013-06-18 ENCOUNTER — Inpatient Hospital Stay (HOSPITAL_COMMUNITY)
Admission: EM | Admit: 2013-06-18 | Discharge: 2013-06-20 | DRG: 378 | Disposition: A | Discharge status: Home / Self Care | Payer: Medicare Other | Attending: Family Medicine | Admitting: Family Medicine

## 2013-06-18 DIAGNOSIS — K921 Melena: Secondary | ICD-10-CM | POA: Diagnosis not present

## 2013-06-18 DIAGNOSIS — K922 Gastrointestinal hemorrhage, unspecified: Secondary | ICD-10-CM | POA: Diagnosis not present

## 2013-06-18 DIAGNOSIS — I5030 Unspecified diastolic (congestive) heart failure: Secondary | ICD-10-CM

## 2013-06-18 DIAGNOSIS — I509 Heart failure, unspecified: Secondary | ICD-10-CM | POA: Diagnosis present

## 2013-06-18 DIAGNOSIS — E871 Hypo-osmolality and hyponatremia: Secondary | ICD-10-CM

## 2013-06-18 DIAGNOSIS — D649 Anemia, unspecified: Secondary | ICD-10-CM

## 2013-06-18 DIAGNOSIS — I5032 Chronic diastolic (congestive) heart failure: Secondary | ICD-10-CM | POA: Diagnosis present

## 2013-06-18 DIAGNOSIS — Z79899 Other long term (current) drug therapy: Secondary | ICD-10-CM

## 2013-06-18 DIAGNOSIS — E876 Hypokalemia: Secondary | ICD-10-CM

## 2013-06-18 DIAGNOSIS — K7689 Other specified diseases of liver: Secondary | ICD-10-CM | POA: Diagnosis not present

## 2013-06-18 DIAGNOSIS — Z7982 Long term (current) use of aspirin: Secondary | ICD-10-CM | POA: Diagnosis not present

## 2013-06-18 DIAGNOSIS — E860 Dehydration: Secondary | ICD-10-CM

## 2013-06-18 DIAGNOSIS — K5010 Crohn's disease of large intestine without complications: Secondary | ICD-10-CM | POA: Diagnosis not present

## 2013-06-18 DIAGNOSIS — E559 Vitamin D deficiency, unspecified: Secondary | ICD-10-CM

## 2013-06-18 DIAGNOSIS — K5289 Other specified noninfective gastroenteritis and colitis: Secondary | ICD-10-CM | POA: Diagnosis not present

## 2013-06-18 DIAGNOSIS — K5090 Crohn's disease, unspecified, without complications: Secondary | ICD-10-CM

## 2013-06-18 DIAGNOSIS — K5730 Diverticulosis of large intestine without perforation or abscess without bleeding: Secondary | ICD-10-CM | POA: Diagnosis present

## 2013-06-18 DIAGNOSIS — K219 Gastro-esophageal reflux disease without esophagitis: Secondary | ICD-10-CM | POA: Diagnosis present

## 2013-06-18 DIAGNOSIS — E785 Hyperlipidemia, unspecified: Secondary | ICD-10-CM | POA: Diagnosis present

## 2013-06-18 LAB — CBC WITH DIFFERENTIAL/PLATELET
Basophils Absolute: 0 10*3/uL (ref 0.0–0.1)
Basophils Relative: 0 % (ref 0–1)
EOS PCT: 0 % (ref 0–5)
Eosinophils Absolute: 0 10*3/uL (ref 0.0–0.7)
HCT: 31.3 % — ABNORMAL LOW (ref 36.0–46.0)
HEMOGLOBIN: 10 g/dL — AB (ref 12.0–15.0)
LYMPHS ABS: 0.6 10*3/uL — AB (ref 0.7–4.0)
Lymphocytes Relative: 5 % — ABNORMAL LOW (ref 12–46)
MCH: 26 pg (ref 26.0–34.0)
MCHC: 31.9 g/dL (ref 30.0–36.0)
MCV: 81.5 fL (ref 78.0–100.0)
Monocytes Absolute: 0.3 10*3/uL (ref 0.1–1.0)
Monocytes Relative: 3 % (ref 3–12)
NEUTROS ABS: 9.5 10*3/uL — AB (ref 1.7–7.7)
Neutrophils Relative %: 92 % — ABNORMAL HIGH (ref 43–77)
Platelets: 305 10*3/uL (ref 150–400)
RBC: 3.84 MIL/uL — AB (ref 3.87–5.11)
RDW: 14.6 % (ref 11.5–15.5)
WBC: 10.3 10*3/uL (ref 4.0–10.5)

## 2013-06-18 LAB — URINALYSIS, ROUTINE W REFLEX MICROSCOPIC
BILIRUBIN URINE: NEGATIVE
Glucose, UA: NEGATIVE mg/dL
HGB URINE DIPSTICK: NEGATIVE
Ketones, ur: NEGATIVE mg/dL
Leukocytes, UA: NEGATIVE
Nitrite: NEGATIVE
PROTEIN: NEGATIVE mg/dL
SPECIFIC GRAVITY, URINE: 1.014 (ref 1.005–1.030)
UROBILINOGEN UA: 0.2 mg/dL (ref 0.0–1.0)
pH: 7 (ref 5.0–8.0)

## 2013-06-18 LAB — BASIC METABOLIC PANEL
BUN: 8 mg/dL (ref 6–23)
CHLORIDE: 97 meq/L (ref 96–112)
CO2: 23 mEq/L (ref 19–32)
Calcium: 7.3 mg/dL — ABNORMAL LOW (ref 8.4–10.5)
Creatinine, Ser: 0.62 mg/dL (ref 0.50–1.10)
GFR calc Af Amer: >90 mL/min (ref >90)
GFR calc non Af Amer: 90 mL/min — ABNORMAL LOW (ref >90)
Glucose, Bld: 130 mg/dL — ABNORMAL HIGH (ref 70–99)
Potassium: 2.6 mEq/L — CL (ref 3.7–5.3)
Sodium: 133 mEq/L — ABNORMAL LOW (ref 137–147)

## 2013-06-18 LAB — TYPE AND SCREEN
ABO/RH(D): O POS
ANTIBODY SCREEN: NEGATIVE

## 2013-06-18 LAB — COMPREHENSIVE METABOLIC PANEL
ALT: 13 U/L (ref 0–35)
AST: 19 U/L (ref 0–37)
Albumin: 2 g/dL — ABNORMAL LOW (ref 3.5–5.2)
Alkaline Phosphatase: 85 U/L (ref 39–117)
BUN: 11 mg/dL (ref 6–23)
CO2: 24 mEq/L (ref 19–32)
CREATININE: 0.83 mg/dL (ref 0.50–1.10)
Calcium: 8.1 mg/dL — ABNORMAL LOW (ref 8.4–10.5)
Chloride: 94 mEq/L — ABNORMAL LOW (ref 96–112)
GFR, EST AFRICAN AMERICAN: 82 mL/min — AB (ref >90)
GFR, EST NON AFRICAN AMERICAN: 70 mL/min — AB (ref >90)
GLUCOSE: 226 mg/dL — AB (ref 70–99)
Potassium: 3.2 mEq/L — ABNORMAL LOW (ref 3.7–5.3)
Sodium: 134 mEq/L — ABNORMAL LOW (ref 137–147)
Total Bilirubin: 0.3 mg/dL (ref 0.3–1.2)
Total Protein: 5.7 g/dL — ABNORMAL LOW (ref 6.0–8.3)

## 2013-06-18 LAB — CBC
HEMATOCRIT: 27.2 % — AB (ref 36.0–46.0)
Hemoglobin: 9.1 g/dL — ABNORMAL LOW (ref 12.0–15.0)
MCH: 26.8 pg (ref 26.0–34.0)
MCHC: 33.5 g/dL (ref 30.0–36.0)
MCV: 80 fL (ref 78.0–100.0)
PLATELETS: 264 10*3/uL (ref 150–400)
RBC: 3.4 MIL/uL — ABNORMAL LOW (ref 3.87–5.11)
RDW: 14.7 % (ref 11.5–15.5)
WBC: 8.4 10*3/uL (ref 4.0–10.5)

## 2013-06-18 LAB — ABO/RH: ABO/RH(D): O POS

## 2013-06-18 LAB — PROTIME-INR
INR: 1.17 (ref 0.00–1.49)
Prothrombin Time: 14.7 seconds (ref 11.6–15.2)

## 2013-06-18 LAB — I-STAT CG4 LACTIC ACID, ED
LACTIC ACID, VENOUS: 3.91 mmol/L — AB (ref 0.5–2.2)
LACTIC ACID, VENOUS: 0.98 mmol/L (ref 0.5–2.2)

## 2013-06-18 LAB — POC OCCULT BLOOD, ED: Fecal Occult Bld: POSITIVE — AB

## 2013-06-18 MED ORDER — IOHEXOL 300 MG/ML  SOLN
50.0000 mL | Freq: Once | INTRAMUSCULAR | Status: AC | PRN
  Administered 2013-06-18: 50 mL via ORAL

## 2013-06-18 MED ORDER — PANTOPRAZOLE SODIUM 40 MG IV SOLR
40.0000 mg | Freq: Two times a day (BID) | INTRAVENOUS | Status: DC
Start: 2013-06-22 — End: 2013-06-19

## 2013-06-18 MED ORDER — IOHEXOL 300 MG/ML  SOLN
100.0000 mL | Freq: Once | INTRAMUSCULAR | Status: AC | PRN
  Administered 2013-06-18: 100 mL via INTRAVENOUS

## 2013-06-18 MED ORDER — POTASSIUM CHLORIDE CRYS ER 20 MEQ PO TBCR
40.0000 meq | EXTENDED_RELEASE_TABLET | ORAL | Status: DC
  Administered 2013-06-18: 40 meq via ORAL
  Filled 2013-06-18 (×2): qty 2

## 2013-06-18 MED ORDER — ONDANSETRON HCL 4 MG PO TABS: 4.0000 mg | ORAL_TABLET | Freq: Four times a day (QID) | ORAL | Status: DC | PRN

## 2013-06-18 MED ORDER — ACETAMINOPHEN 325 MG PO TABS
650.0000 mg | ORAL_TABLET | Freq: Four times a day (QID) | ORAL | Status: DC | PRN
  Administered 2013-06-20: 650 mg via ORAL
  Filled 2013-06-18 (×2): qty 2

## 2013-06-18 MED ORDER — SODIUM CHLORIDE 0.9 % IV BOLUS (SEPSIS)
1000.0000 mL | Freq: Once | INTRAVENOUS | Status: AC
  Administered 2013-06-18: 1000 mL via INTRAVENOUS

## 2013-06-18 MED ORDER — SODIUM CHLORIDE 0.9 % IJ SOLN
3.0000 mL | Freq: Two times a day (BID) | INTRAMUSCULAR | Status: DC
  Administered 2013-06-18 – 2013-06-20 (×3): 3 mL via INTRAVENOUS

## 2013-06-18 MED ORDER — PANTOPRAZOLE SODIUM 40 MG IV SOLR
80.0000 mg | Freq: Once | INTRAVENOUS | Status: AC
  Administered 2013-06-18: 80 mg via INTRAVENOUS
  Filled 2013-06-18: qty 80

## 2013-06-18 MED ORDER — ACETAMINOPHEN 650 MG RE SUPP: 650.0000 mg | Freq: Four times a day (QID) | RECTAL | Status: DC | PRN

## 2013-06-18 MED ORDER — ONDANSETRON HCL 4 MG/2ML IJ SOLN
4.0000 mg | Freq: Four times a day (QID) | INTRAMUSCULAR | Status: DC | PRN
  Administered 2013-06-18 – 2013-06-19 (×3): 4 mg via INTRAVENOUS
  Filled 2013-06-18 (×3): qty 2

## 2013-06-18 MED ORDER — PREDNISONE 50 MG PO TABS
50.0000 mg | ORAL_TABLET | Freq: Every day | ORAL | Status: DC
  Administered 2013-06-19: 50 mg via ORAL
  Filled 2013-06-18 (×3): qty 1

## 2013-06-18 MED ORDER — IOHEXOL 350 MG/ML SOLN: 100.0000 mL | Freq: Once | INTRAVENOUS | Status: DC | PRN

## 2013-06-18 MED ORDER — SODIUM CHLORIDE 0.9 % IV SOLN
8.0000 mg/h | INTRAVENOUS | Status: DC
  Administered 2013-06-18 – 2013-06-19 (×2): 8 mg/h via INTRAVENOUS
  Filled 2013-06-18 (×5): qty 80

## 2013-06-18 MED ORDER — SODIUM CHLORIDE 0.9 % IV SOLN
INTRAVENOUS | Status: DC
  Administered 2013-06-18 – 2013-06-20 (×4): via INTRAVENOUS

## 2013-06-18 NOTE — ED Provider Notes (Signed)
CSN: [id number]     Arrival date & time 06/18/13  1106 History   First MD Initiated Contact with Patient 06/18/13 1121     Chief Complaint  Patient presents with  . Rectal Bleeding     (Consider location/radiation/quality/duration/timing/severity/associated sxs/prior Treatment) HPI Comments: 68 year old female with a history of Crohn's disease presents with multiple bloody bowel movements since this morning. She states that she was just discharged from the hospital with concerns for her heart and then today noticed bloody stools. She states it started as bright red blood and now is more darker. She's been having abdominal pain for the past week. Denies any nausea or vomiting. No history of fevers. She's not a blood thinners. She was on blood thinners while in the hospital Will she's getting her cardiac cath but was not discharged on blood thinners. She was discharged on some medicine for her abdominal pain but she's not know what it was. She has felt dizzy today.   Past Medical History  Diagnosis Date  . Colitis   . GERD (gastroesophageal reflux disease)   . Anemia   . Adrenal nodule   . Diverticulosis   . Hx of gastritis   . History of esophageal stricture   . Choledocholithiasis with obstruction   . Diabetes mellitus     pt denies  . Hiatal hernia   . Blood transfusion without reported diagnosis   . Cataract     bil removed  . Crohn's colitis 07/11/12   Past Surgical History  Procedure Laterality Date  . Cholecystectomy  2009  . Tubal ligation    . Cataract extraction    . Ercp  06/02/2011    Procedure: ENDOSCOPIC RETROGRADE CHOLANGIOPANCREATOGRAPHY (ERCP);  Surgeon: Lafayette Dragon, MD;  Location: [facility];  Service: Endoscopy;  Laterality: N/A;  dottie /ja  . Ercp  06/02/2011    Procedure: ENDOSCOPIC RETROGRADE CHOLANGIOPANCREATOGRAPHY (ERCP);  Surgeon: Gatha Mayer, MD;  Location: [facility];  Service: Endoscopy;  Laterality: N/A;  . Colonoscopy    . Upper  gastrointestinal endoscopy     Family History  Problem Relation Age of Onset  . Bone cancer Maternal Grandmother   . Colon cancer Maternal Aunt 68  . Stomach cancer Sister 37    decsd  . Breast cancer Sister   . Esophageal cancer Neg Hx   . Rectal cancer Neg Hx    History  Substance Use Topics  . Smoking status: Never Smoker   . Smokeless tobacco: Never Used  . Alcohol Use: No   OB History   Grav Para Term Preterm Abortions TAB SAB Ect Mult Living                 Review of Systems  Constitutional: Negative for fever.  Respiratory: Negative for shortness of breath.   Cardiovascular: Negative for chest pain.  Gastrointestinal: Positive for abdominal pain, diarrhea, blood in stool and hematochezia. Negative for nausea and vomiting.  Neurological: Positive for dizziness.  All other systems reviewed and are negative.      Allergies  Review of patient's allergies indicates no known allergies.  Home Medications   Current Outpatient Rx  Name  Route  Sig  Dispense  Refill  . aspirin EC 81 MG EC tablet   Oral   Take 1 tablet (81 mg total) by mouth daily.   30 tablet   11   . folic acid (FOLVITE) 1 MG tablet   Oral   Take 1 mg by mouth daily.         Marland Kitchen  omeprazole (PRILOSEC) 20 MG capsule   Oral   Take 20 mg by mouth daily.         . predniSONE (DELTASONE) 50 MG tablet   Oral   Take 1 tablet (50 mg total) by mouth daily with breakfast.   4 tablet   0    BP 106/50  Pulse 71  Temp(Src) 97.7 F (36.5 C) (Oral)  Resp 18  SpO2 97% Physical Exam  Nursing note and vitals reviewed. Constitutional: She is oriented to person, place, and time. She appears well-developed and well-nourished.  HENT:  Head: Normocephalic and atraumatic.  Right Ear: External ear normal.  Left Ear: External ear normal.  Nose: Nose normal.  Eyes: Right eye exhibits no discharge. Left eye exhibits no discharge.  Cardiovascular: Normal rate, regular rhythm and normal heart sounds.    Pulmonary/Chest: Effort normal and breath sounds normal.  Abdominal: Soft. There is tenderness (Mild, diffuse tenderness, no localized area of more significant tenderness).  Genitourinary: Rectal exam shows external hemorrhoid (non-thrombosed). Guaiac positive stool (dark red blood on rectal exam).  Neurological: She is alert and oriented to person, place, and time.  Skin: Skin is warm and dry.    ED Course  Procedures (including critical care time) Labs Review Labs Reviewed  CBC WITH DIFFERENTIAL - Abnormal; Notable for the following:    RBC 3.84 (*)    Hemoglobin 10.0 (*)    HCT 31.3 (*)    Neutrophils Relative % 92 (*)    Neutro Abs 9.5 (*)    Lymphocytes Relative 5 (*)    Lymphs Abs 0.6 (*)    All other components within normal limits  COMPREHENSIVE METABOLIC PANEL - Abnormal; Notable for the following:    Sodium 134 (*)    Potassium 3.2 (*)    Chloride 94 (*)    Glucose, Bld 226 (*)    Calcium 8.1 (*)    Total Protein 5.7 (*)    Albumin 2.0 (*)    GFR calc non Af Amer 70 (*)    GFR calc Af Amer 82 (*)    All other components within normal limits  POC OCCULT BLOOD, ED - Abnormal; Notable for the following:    Fecal Occult Bld POSITIVE (*)    All other components within normal limits  I-STAT CG4 LACTIC ACID, ED - Abnormal; Notable for the following:    Lactic Acid, Venous 3.91 (*)    All other components within normal limits  URINALYSIS, ROUTINE W REFLEX MICROSCOPIC  PROTIME-INR  I-STAT CG4 LACTIC ACID, ED  TYPE AND SCREEN  ABO/RH   Imaging Review Ct Abdomen Pelvis W Contrast  06/18/2013   EXAM: CT ABDOMEN AND PELVIS WITH CONTRAST  TECHNIQUE: Multidetector CT imaging of the abdomen and pelvis was performed using the standard protocol following bolus administration of intravenous contrast.  CONTRAST:  154m OMNIPAQUE IOHEXOL 300 MG/ML SOLN intravenously; the patient also received oral contrast material.  COMPARISON:  CT scan of the abdomen and pelvis dated May 29, 2011.  FINDINGS: The stomach is partially distended with the oral contrast. There is a moderate-sized hiatal hernia/partially intrathoracic stomach. The duodenum is partially distended and grossly normal. There is contrast within the proximal and mid small bowel, but none has reached the distal ileum or colon. There is minimal distention of the small bowel loops with contrast, but no definite mural thickening or surrounding inflammatory change is demonstrated. Within the colon a normal stool and gas pattern is present in the ascending and  transverse and descending colons. There is subjective mild wall thickening in the sigmoid colon especially proximally. This is demonstrated best on images 55-63. Minimal increased density in the perisigmoid fat is demonstrated. There is fluid adjacent to the right urinary bladder. Some fluid is closely applied to the mid sigmoid colon on images 74-80. There is no evidence of an abscess. The rectum does not appear abnormally distended.  The urinary bladder is partially distended but grossly normal. The uterus is vertically oriented. There is a small amount of fluid in the adnexal regions and cul de sac.  The liver exhibits decreased density diffusely consistent with fatty infiltration. This is stable. The gallbladder is surgically absent. There is no intrahepatic ductal dilation. The pancreas, spleen, adrenal glands, and kidneys exhibit no acute abnormalities. There is a parapelvic cyst in the right kidney, and and there is an extrarenal pelvis in the left kidney. The caliber of the abdominal aorta is normal. There is no periaortic or pericaval lymphadenopathy.  The lumbar spine and bony pelvis exhibit no acute abnormalities. The lung bases are clear.  IMPRESSION: 1. There is mild thickening of the wall of the proximal sigmoid colon which may reflect inflammatory change of the sigmoid colon given the history of Crohn's disease. There is no evidence of a perisigmoid abscess. No  free extraluminal gas is demonstrated. There is no bowel obstruction or ileus. There is fluid within the pelvis adjacent to the urinary bladder. This may be inflammatory in nature. 2. There are fatty infiltrative changes of the liver. There is no acute hepatobiliary nor acute urinary tract abnormality.   Electronically Signed   By: David  Martinique   On: 06/18/2013 15:14     EKG Interpretation None      MDM   Final diagnoses:  GI bleeding  Crohn's colitis    Patient with hypotension on arrival, resuscitated with IV fluids and had stabilization of her BP. As she is having active GI bleeding and is symptomatic, she will need close monitoring in stepdown unit. She is accepted for admission by family practice, they will consult GI as she is now stable. Her abd exam has mild tenderness, CT c/w crohn's flare, but no abscess or perforation. Will admit.    Ephraim Hamburger, MD 06/18/13 [phone]

## 2013-06-18 NOTE — ED Notes (Signed)
Pt to CT

## 2013-06-18 NOTE — ED Notes (Signed)
Pt resting on stretcher, IVF infusing a/o, NAD.

## 2013-06-18 NOTE — ED Notes (Signed)
Initial Contact - pt to [place] from triage, changed to hospital gown, placed to cardiac/02 monitor.  Pt reports hx crohn's.  Reports BM this AM "filled the bowl with blood".  Pt reports occ blood with stool secondary to crohn's "but nothing like this".  Pt reports diffuse abd pain 7/10 and dizziness.  Skin PWD.  MAEI.  Speaking full/clear sentences.  Pt denies CP/palpitations or SOB.  Dr. Regenia Skeeter at bs for eval on arrival.

## 2013-06-18 NOTE — Progress Notes (Signed)
Diane Freeman [id number] Code Status: full   Admission Data: 06/18/2013 5:55 PM Attending Provider:  Mingo Amber UXY:BFXO Olevia Perches, MD Consults/ Treatment Team:    Diane Freeman is a 68 y.o. female patient admitted from ED awake, alert - oriented  X 3 - no acute distress noted.  VSS - Blood pressure 130/57, pulse 69, temperature 98.1 F (36.7 C), temperature source Oral, resp. rate 19, height 5' 2"  (1.575 m), weight 65.1 kg (143 lb 8.3 oz), SpO2 97.00%.  no c/o shortness of breath, no c/o chest pain. Cardiac tele # [id number], in place, cardiac monitor yields:normal sinus rhythm.  IV Fluids:  IV in place, occlusive dsg intact without redness, IV cath antecubital left, condition patent and no redness normal saline.  Allergies:  No Known Allergies   Past Medical History  Diagnosis Date  . Colitis   . GERD (gastroesophageal reflux disease)   . Anemia   . Adrenal nodule   . Diverticulosis   . Hx of gastritis   . History of esophageal stricture   . Choledocholithiasis with obstruction   . Diabetes mellitus     pt denies  . Hiatal hernia   . Blood transfusion without reported diagnosis   . Cataract     bil removed  . Crohn's colitis 07/11/12   Medications Prior to Admission  Medication Sig Dispense Refill  . aspirin EC 81 MG EC tablet Take 1 tablet (81 mg total) by mouth daily.  30 tablet  11  . folic acid (FOLVITE) 1 MG tablet Take 1 mg by mouth daily.      Marland Kitchen omeprazole (PRILOSEC) 20 MG capsule Take 20 mg by mouth daily.      . predniSONE (DELTASONE) 50 MG tablet Take 1 tablet (50 mg total) by mouth daily with breakfast.  4 tablet  0   History:  obtained from the patient. Tobacco/alcohol: denied none  Orientation to room, and floor completed with information packet given to patient/family.  Patient declined safety video at this time.  Admission INP armband ID verified with patient/family, and in place.   SR up x 2, fall assessment complete, with patient and family able to verbalize  understanding of risk associated with falls, and verbalized understanding to call nsg before up out of bed.  Call light within reach, patient able to voice, and demonstrate understanding.  Skin, clean-dry- intact without evidence of bruising, or skin tears.   No evidence of skin break down noted on exam.     Will cont to eval and treat per MD orders.  Gracy Bruins, RN 06/18/2013 5:55 PM

## 2013-06-18 NOTE — H&P (Signed)
[facility] Admission History and Physical Service Pager: [phone]  Patient name: Diane Freeman Medical record number: [id number] Date of birth: Sep 28, 1945 Age: 68 y.o. Gender: female  Primary Care Provider: Delfin Edis, MD Consultants: GI Code Status: Full  Chief Complaint: blood in toilet  Assessment and Plan: DELLAR TRABER is a 68 y.o. female presenting with hematochezia. PMH is significant for Crohn's disease, GERD, and recent UTI, lung nodule; recently discharged on 3/16 for chest pain and Crohn's flare (had cardiac cath done).   # Hematochezia: Vitals initially with low BP, received 2, 1L boluses in ED with improvement to 120-130/50-60s. Hgb dropped from 11.1 on 3/15 to 10.0 today; Cmet with mild hyponatremia 134 (appears to run low), hypokalemia 3.2, total protein 5.7 and albumin 2.0.  i-STAT lactic acid 3.91, repeat 4 hours later 0.98. CT abdomen with no free air, sign of abscess, has chronic inflammation in sigmoid from Crohn's, fatty infiltrate of liver. Recently started taking daily aspirin after hospital discharge, suspect in setting of now taking aspirin has had bleed from uncovered pathology from Crohn's, diverticulosis, AVM vs fistula vs angiodysplasia.  - admit to SDU - telemetry, vitals per unit, close monitoring of BP.  If worsening anemia/hemodynamics, discuss case with GI again.  - repeat Hgb and bmet this PM to evaluate for worsening anemia and hyponatremia s/p fluid boluses (expect some component of hemo dilution) - IV protonix bolus and drip - additional 1 L NS bolus, then NS 100cc/hr - consult made to GI, patient is followed by Dr. Maurene Capes. NPO after midnight with likely colonoscopy tomorrow.  Case d/w Dr. Deatra Ina, [facility].   # Hypokalemia: K+ 3.2,  - re-check bmet and replete as needed  # Crohn's disease exacerbation/flare: recently discharged for Crohn's flare, started on prednisone and continued as outpatient for 7 day course -  PO Prednisone 50 mg daily   # Hx of HLD  - continue statin  # GERD  - Protonix daily   FEN/GI: full liquid, NPO after midnight, Fluids as above  Prophylaxis: SCDs  Disposition: SDU pending further evaluation, hemodynamic stability.   History of Present Illness: MADDELINE Freeman is a 68 y.o. female presenting with blood in bowel movements.  Pt recently admitted and discharged from the hospital for chest pain r/o with cardiac cath, discharged three days ago, initially sent from Dr. Ricky Stabs office for her angina.  Pt was not having Crohn's flare at that time, however, started to have hematochezia in the toilet bowel this morning. Started with a normal BM and then had bright red blood for multiple episodes, later turned into darker stool. She denies any increased abdominal pain (has some lower abdominal pain similar to her typical pain from Crohn's), but does state she was having dizziness w/o any syncopal episodes.  Has had minimal urine output, but denies any dyspnea, angina, N/V, fever, chills, sweats.    No changes in her medical history since discharge from hospital three days ago.  Still taking her prednisone that she was started on with her last hospital stay.   Review Of Systems: Per HPI   Patient Active Problem List   Diagnosis Date Noted  . GI bleeding 06/18/2013  . Diastolic CHF 82/99/3716  . UTI (lower urinary tract infection) 06/12/2013  . Dehydration 06/12/2013  . Chest pain 06/12/2013  . Crohn's disease 06/10/2013  . UTI (urinary tract infection) 05/14/2013  . External hemorrhoid 02/05/2012  . Rectal bleeding 02/05/2012  . Routine health maintenance 02/05/2012  .  Obstruction of bile duct 06/02/2011  . Choledocholithiasis with obstruction 06/02/2011  . Nonspecific elevation of levels of transaminase or lactic acid dehydrogenase (LDH) 06/02/2011  . Hypokalemia 06/02/2011  . Vitamin d deficiency 05/29/2011  . Elevated BP 05/29/2011  . Diverticulitis 06/19/2010  . ANEMIA  11/27/2007  . ESOPHAGEAL STRICTURE 11/27/2007  . GERD 11/27/2007  . DUODENITIS 11/27/2007   Past Medical History: Past Medical History  Diagnosis Date  . Colitis   . GERD (gastroesophageal reflux disease)   . Anemia   . Adrenal nodule   . Diverticulosis   . Hx of gastritis   . History of esophageal stricture   . Choledocholithiasis with obstruction   . Diabetes mellitus     pt denies  . Hiatal hernia   . Blood transfusion without reported diagnosis   . Cataract     bil removed  . Crohn's colitis 07/11/12   Past Surgical History: Past Surgical History  Procedure Laterality Date  . Cholecystectomy  2009  . Tubal ligation    . Cataract extraction    . Ercp  06/02/2011    Procedure: ENDOSCOPIC RETROGRADE CHOLANGIOPANCREATOGRAPHY (ERCP);  Surgeon: Lafayette Dragon, MD;  Location: [facility];  Service: Endoscopy;  Laterality: N/A;  dottie /ja  . Ercp  06/02/2011    Procedure: ENDOSCOPIC RETROGRADE CHOLANGIOPANCREATOGRAPHY (ERCP);  Surgeon: Gatha Mayer, MD;  Location: [facility];  Service: Endoscopy;  Laterality: N/A;  . Colonoscopy    . Upper gastrointestinal endoscopy     Social History: History  Substance Use Topics  . Smoking status: Never Smoker   . Smokeless tobacco: Never Used  . Alcohol Use: No   Family History: Family History  Problem Relation Age of Onset  . Bone cancer Maternal Grandmother   . Colon cancer Maternal Aunt 75  . Stomach cancer Sister 65    decsd  . Breast cancer Sister   . Esophageal cancer Neg Hx   . Rectal cancer Neg Hx    Allergies and Medications: No Known Allergies No current facility-administered medications on file prior to encounter.   Current Outpatient Prescriptions on File Prior to Encounter  Medication Sig Dispense Refill  . aspirin EC 81 MG EC tablet Take 1 tablet (81 mg total) by mouth daily.  30 tablet  11  . folic acid (FOLVITE) 1 MG tablet Take 1 mg by mouth daily.      Marland Kitchen omeprazole (PRILOSEC) 20 MG capsule Take 20 mg by  mouth daily.      . predniSONE (DELTASONE) 50 MG tablet Take 1 tablet (50 mg total) by mouth daily with breakfast.  4 tablet  0    Objective: BP 126/66  Pulse 67  Temp(Src) 97.7 F (36.5 C) (Oral)  Resp 17  SpO2 100% Exam: General: NAD, comfortable in bed HEENT: PERRL, EOMI, MMM. Cardiovascular: RRR, normal heart sounds, no murmurs Respiratory: CTAB, normal effort Abdomen: soft, tender to palpation in lower abdomen but no rebound or guarding. Bowel sounds present Extremities: no edema or cyanosis, feet slightly cool to touch Skin: several medial 72m petechia in lower legs bilaterally, no ecchymosis  Neuro: alert and oriented, no focal deficits.  Labs and Imaging: CBC BMET   Recent Labs Lab 06/18/13 1150  WBC 10.3  HGB 10.0*  HCT 31.3*  PLT 305    Recent Labs Lab 06/18/13 1150  NA 134*  K 3.2*  CL 94*  CO2 24  BUN 11  CREATININE 0.83  GLUCOSE 226*  CALCIUM 8.1*  CT abdomen w/ contrast 06/18/13 IMPRESSION:  1. There is mild thickening of the wall of the proximal sigmoid  colon which may reflect inflammatory change of the sigmoid colon  given the history of Crohn's disease. There is no evidence of a  perisigmoid abscess. No free extraluminal gas is demonstrated. There  is no bowel obstruction or ileus. There is fluid within the pelvis  adjacent to the urinary bladder. This may be inflammatory in nature.  2. There are fatty infiltrative changes of the liver. There is no  acute hepatobiliary nor acute urinary tract abnormality.   Tawanna Sat, MD 06/18/2013, 4:39 PM PGY-1, [place] Intern pager: [phone], text pages welcome  I have seen and evaluated the pt with Dr. Lamar Benes, please refer to his excellent H&P above.  My additions are in red.  Tamela Oddi Awanda Mink, DO of [facility] 06/18/2013, 8:19 PM

## 2013-06-18 NOTE — Telephone Encounter (Signed)
Spoke with sister and told her ED MD will evaluate patient and decide if she needs GI consult.

## 2013-06-18 NOTE — Telephone Encounter (Signed)
Patient states she is having bright, red blood from rectum. States "it is just flowing." Hx Crohn's. States she was inpatient at [facility] last week. Patient is going to ED.

## 2013-06-18 NOTE — ED Notes (Signed)
Notified dr Regenia Skeeter lactic acid high...klj

## 2013-06-18 NOTE — ED Notes (Signed)
Pt had bowel movement this morning with large amount bright red blood that turned into black blood; history of crohns with flair up x 1 wk; c/o mild abd pain

## 2013-06-18 NOTE — ED Notes (Signed)
Attempted to call report, RN unavailable at this time and will return my call per unit secretary.

## 2013-06-18 NOTE — ED Notes (Signed)
[facility] here to provide safe transport to [facility]. Verbal report to Enid Derry, Therapist, sports.  NAD upon leaving dept.

## 2013-06-18 NOTE — Telephone Encounter (Signed)
See previous note

## 2013-06-19 DIAGNOSIS — D649 Anemia, unspecified: Secondary | ICD-10-CM

## 2013-06-19 DIAGNOSIS — K5010 Crohn's disease of large intestine without complications: Secondary | ICD-10-CM | POA: Diagnosis not present

## 2013-06-19 DIAGNOSIS — K5090 Crohn's disease, unspecified, without complications: Secondary | ICD-10-CM

## 2013-06-19 DIAGNOSIS — I5030 Unspecified diastolic (congestive) heart failure: Secondary | ICD-10-CM

## 2013-06-19 DIAGNOSIS — K922 Gastrointestinal hemorrhage, unspecified: Secondary | ICD-10-CM | POA: Diagnosis not present

## 2013-06-19 DIAGNOSIS — K921 Melena: Principal | ICD-10-CM

## 2013-06-19 DIAGNOSIS — I509 Heart failure, unspecified: Secondary | ICD-10-CM

## 2013-06-19 DIAGNOSIS — E876 Hypokalemia: Secondary | ICD-10-CM

## 2013-06-19 DIAGNOSIS — E871 Hypo-osmolality and hyponatremia: Secondary | ICD-10-CM

## 2013-06-19 DIAGNOSIS — E559 Vitamin D deficiency, unspecified: Secondary | ICD-10-CM

## 2013-06-19 DIAGNOSIS — E860 Dehydration: Secondary | ICD-10-CM | POA: Diagnosis not present

## 2013-06-19 LAB — CBC
HCT: 29.5 % — ABNORMAL LOW (ref 36.0–46.0)
HCT: 28.5 % — ABNORMAL LOW (ref 36.0–46.0)
HEMOGLOBIN: 9.9 g/dL — AB (ref 12.0–15.0)
HEMOGLOBIN: 9.4 g/dL — AB (ref 12.0–15.0)
MCH: 27.2 pg (ref 26.0–34.0)
MCH: 26.6 pg (ref 26.0–34.0)
MCHC: 33.6 g/dL (ref 30.0–36.0)
MCHC: 33 g/dL (ref 30.0–36.0)
MCV: 81 fL (ref 78.0–100.0)
MCV: 80.7 fL (ref 78.0–100.0)
Platelets: 294 10*3/uL (ref 150–400)
Platelets: 248 10*3/uL (ref 150–400)
RBC: 3.64 MIL/uL — ABNORMAL LOW (ref 3.87–5.11)
RBC: 3.53 MIL/uL — AB (ref 3.87–5.11)
RDW: 15.2 % (ref 11.5–15.5)
RDW: 15.4 % (ref 11.5–15.5)
WBC: 10.9 10*3/uL — AB (ref 4.0–10.5)
WBC: 7 10*3/uL (ref 4.0–10.5)

## 2013-06-19 LAB — BASIC METABOLIC PANEL
BUN: 10 mg/dL (ref 6–23)
CO2: 23 mEq/L (ref 19–32)
CREATININE: 0.69 mg/dL (ref 0.50–1.10)
Calcium: 7.7 mg/dL — ABNORMAL LOW (ref 8.4–10.5)
Chloride: 100 mEq/L (ref 96–112)
GFR, EST NON AFRICAN AMERICAN: 87 mL/min — AB (ref >90)
Glucose, Bld: 97 mg/dL (ref 70–99)
POTASSIUM: 3.9 meq/L (ref 3.7–5.3)
Sodium: 136 mEq/L — ABNORMAL LOW (ref 137–147)

## 2013-06-19 LAB — MRSA PCR SCREENING: MRSA by PCR: NEGATIVE

## 2013-06-19 MED ORDER — POTASSIUM CHLORIDE 10 MEQ/100ML IV SOLN
10.0000 meq | INTRAVENOUS | Status: AC
  Administered 2013-06-19 (×6): 10 meq via INTRAVENOUS
  Filled 2013-06-19 (×6): qty 100

## 2013-06-19 MED ORDER — MESALAMINE 1.2 G PO TBEC
4.8000 g | DELAYED_RELEASE_TABLET | Freq: Every day | ORAL | Status: DC
  Administered 2013-06-19 – 2013-06-20 (×2): 4.8 g via ORAL
  Filled 2013-06-19 (×3): qty 4

## 2013-06-19 MED ORDER — MORPHINE SULFATE 2 MG/ML IJ SOLN
2.0000 mg | INTRAMUSCULAR | Status: DC | PRN
  Administered 2013-06-19 – 2013-06-20 (×3): 2 mg via INTRAVENOUS
  Filled 2013-06-19 (×3): qty 1

## 2013-06-19 MED ORDER — PANTOPRAZOLE SODIUM 40 MG PO TBEC
40.0000 mg | DELAYED_RELEASE_TABLET | Freq: Every day | ORAL | Status: DC
  Administered 2013-06-20: 40 mg via ORAL
  Filled 2013-06-19: qty 1

## 2013-06-19 MED ORDER — PREDNISONE 20 MG PO TABS
40.0000 mg | ORAL_TABLET | Freq: Every day | ORAL | Status: DC
  Administered 2013-06-20: 40 mg via ORAL
  Filled 2013-06-19 (×2): qty 2

## 2013-06-19 NOTE — Progress Notes (Signed)
Pt nauseous and unable to take K-DUR tablet. MD notified. New orders received. Also notified MD pt has had 2 bloody stools in last 4 hours.

## 2013-06-19 NOTE — Progress Notes (Signed)
Family Medicine Teaching Service Daily Progress Note Intern Pager: [phone]  Patient name: Diane Freeman Medical record number: [id number] Date of birth: 1945/06/16 Age: 68 y.o. Gender: female  Primary Care Provider: [name], MD Consultants: GI Code Status: Full  Pt Overview and Major Events to Date:   Assessment and Plan: Diane Freeman is a 68 y.o. female presenting with hematochezia. PMH is significant for Crohn's disease, GERD, and recent UTI, lung nodule; recently discharged on 3/16 for chest pain and Crohn's flare (had cardiac cath done).   # Hematochezia: Vitals initially with low BP responsive to fluids. CT abdomen with no free air, sign of abscess, has chronic inflammation in sigmoid from Crohn's, fatty infiltrate of liver. Suspect in setting of recent daily aspirin has had bleed from uncovered pathology from Crohn's, AVM vs fistula vs angiodysplasia, diverticulosis.  - telemetry, vitals per unit, close monitoring of BP. If worsening anemia/hemodynamics, discuss case with GI again.  - IV protonix bolus and drip  - IVF NS 100cc/hr  - consult made to GI, patient is followed by Dr. Maurene Capes. NPO after midnight with likely colonoscopy tomorrow. Case d/w Dr. Deatra Ina, [facility].   # Hypokalemia: K+ 3.2, 2.6 on admission here - repleted with k-dur and IV potassium runs; repeat this morning 3.9 - continue to trend  # Crohn's disease exacerbation/flare: recently discharged for Crohn's flare, started on prednisone and continued as outpatient for 7 day course  - PO Prednisone 50 mg daily   # Hx of HLD  - continue statin   # GERD  - Protonix daily   FEN/GI: NPO, NS 100cc/hr  Prophylaxis: SCDs  Disposition: pending GI eval, clinical improvement  Subjective:  Had continued bleeding in stools overnight, a little dizzy when sitting/standing up, pain is a little worse in her lower abdomen.  Objective: Temp:  [97.7 F (36.5 C)-98.6 F (37 C)] 97.9 F (36.6 C) (03/20  0723) Pulse Rate:  [42-86] 84 (03/20 0500) Resp:  [16-24] 24 (03/20 0723) BP: (84-130)/(47-82) 121/58 mmHg (03/20 0723) SpO2:  [97 %-100 %] 100 % (03/20 0723) Weight:  [143 lb 8.3 oz (65.1 kg)] 143 lb 8.3 oz (65.1 kg) (03/19 1700) Physical Exam: General: NAD Cardiovascular: RRR, normal heart sounds, no murmurs Respiratory: CTAB, normal effort Abdomen: soft, obese, tender to lower quadrants bilaterally, no rebound or guarding Extremities: no edema or cyanosis, wwp Neuro: alert and oriented, no focal deficits  Laboratory:  Recent Labs Lab 06/18/13 1150 06/18/13 2048 06/19/13 0327  WBC 10.3 8.4 10.9*  HGB 10.0* 9.1* 9.9*  HCT 31.3* 27.2* 29.5*  PLT 305 264 294    Recent Labs Lab 06/12/13 1516  06/18/13 1150 06/18/13 2048 06/19/13 0327  NA 135*  < > 134* 133* 136*  K 3.2*  < > 3.2* 2.6* 3.9  CL 97  < > 94* 97 100  CO2 23  < > 24 23 23   BUN 25*  < > 11 8 10   CREATININE 0.74  < > 0.83 0.62 0.69  CALCIUM 8.5  < > 8.1* 7.3* 7.7*  PROT 6.4  --  5.7*  --   --   BILITOT 0.7  --  0.3  --   --   ALKPHOS 127*  --  85  --   --   ALT 15  --  13  --   --   AST 10  --  19  --   --   GLUCOSE 132*  < > 226* 130* 97  < > =  values in this interval not displayed.    Imaging/Diagnostic Tests:  Ct Abdomen Pelvis W Contrast  06/18/2013   EXAM: CT ABDOMEN AND PELVIS WITH CONTRAST  TECHNIQUE: Multidetector CT imaging of the abdomen and pelvis was performed using the standard protocol following bolus administration of intravenous contrast.  CONTRAST:  119m OMNIPAQUE IOHEXOL 300 MG/ML SOLN intravenously; the patient also received oral contrast material.  COMPARISON:  CT scan of the abdomen and pelvis dated May 29, 2011.  FINDINGS: The stomach is partially distended with the oral contrast. There is a moderate-sized hiatal hernia/partially intrathoracic stomach. The duodenum is partially distended and grossly normal. There is contrast within the proximal and mid small bowel, but none has  reached the distal ileum or colon. There is minimal distention of the small bowel loops with contrast, but no definite mural thickening or surrounding inflammatory change is demonstrated. Within the colon a normal stool and gas pattern is present in the ascending and transverse and descending colons. There is subjective mild wall thickening in the sigmoid colon especially proximally. This is demonstrated best on images 55-63. Minimal increased density in the perisigmoid fat is demonstrated. There is fluid adjacent to the right urinary bladder. Some fluid is closely applied to the mid sigmoid colon on images 74-80. There is no evidence of an abscess. The rectum does not appear abnormally distended.  The urinary bladder is partially distended but grossly normal. The uterus is vertically oriented. There is a small amount of fluid in the adnexal regions and cul de sac.  The liver exhibits decreased density diffusely consistent with fatty infiltration. This is stable. The gallbladder is surgically absent. There is no intrahepatic ductal dilation. The pancreas, spleen, adrenal glands, and kidneys exhibit no acute abnormalities. There is a parapelvic cyst in the right kidney, and and there is an extrarenal pelvis in the left kidney. The caliber of the abdominal aorta is normal. There is no periaortic or pericaval lymphadenopathy.  The lumbar spine and bony pelvis exhibit no acute abnormalities. The lung bases are clear.  IMPRESSION: 1. There is mild thickening of the wall of the proximal sigmoid colon which may reflect inflammatory change of the sigmoid colon given the history of Crohn's disease. There is no evidence of a perisigmoid abscess. No free extraluminal gas is demonstrated. There is no bowel obstruction or ileus. There is fluid within the pelvis adjacent to the urinary bladder. This may be inflammatory in nature. 2. There are fatty infiltrative changes of the liver. There is no acute hepatobiliary nor acute  urinary tract abnormality.   Electronically Signed   By: David  JMartinique  On: 06/18/2013 15:14    ATawanna Sat MD 06/19/2013, 8:25 AM PGY-1, CTillsonIntern pager: [phone] text pages welcome

## 2013-06-19 NOTE — Progress Notes (Signed)
INITIAL NUTRITION ASSESSMENT  DOCUMENTATION CODES Per approved criteria  -Not Applicable   INTERVENTION: Diet advancement per MD. Recommend the addition of Resource Breeze BID once diet allows. Will defer diet education to later date. RD to continue to follow nutrition care plan.  NUTRITION DIAGNOSIS: Inadequate oral intake related to GI distress AEB limited tolerance of PO's.  Goal: Advance diet as tolerated. Intake to meet >90% of estimated nutrition needs.  Monitor:  Weights, labs, diet advancement, PO itnake  Reason for Assessment: Malnutrition screening tool  68 y.o. female  Admitting Dx: hematochezia  ASSESSMENT: PMHx is significant for Crohn's disease, GERD, and recent UTI. Admitted currently with bloody bowel movements. Recently admitted for Crohns flare. Work-up ongoing.  Pt last seen by RD staff approximately 1 week ago 2/2 recent admit for Crohns flare.  Pt reports that her usual weight is 160 lb and weighed this about 1 year ago. She is down to 143 lb, this is a weight change of 11% x 1 year and is not significant. She reports that when she was last here, she was advanced to a regular diet and tolerated well but had self-limiting intake. When she discharged home, intake remained minimal, consuming at most a cup of soup and a sandwich during the entire day. She is now NPO. She appears well-nourished but remains at nutrition risk 2/2 poor oral intake.  Pt would benefit from education (per her request) regarding which foods that she can and cannot eat. Would like education closer to d/c.  Sodium low at 136 Potassium now repleted and WNL IVF of NS at 100 ml/hr.  Height: Ht Readings from Last 1 Encounters:  06/18/13 5' 2"  (1.575 m)    Weight: Wt Readings from Last 1 Encounters:  06/18/13 143 lb 8.3 oz (65.1 kg)    Ideal Body Weight: 110 lb   % Ideal Body Weight: 130%  Wt Readings from Last 10 Encounters:  06/18/13 143 lb 8.3 oz (65.1 kg)  06/15/13 148  lb (67.132 kg)  06/15/13 148 lb (67.132 kg)  06/12/13 137 lb (62.143 kg)  06/10/13 137 lb (62.143 kg)  05/13/13 147 lb (66.679 kg)  11/18/12 157 lb 3.2 oz (71.305 kg)  08/18/12 158 lb 4 oz (71.782 kg)  07/09/12 161 lb (73.029 kg)  07/08/12 160 lb (72.576 kg)    Usual Body Weight: 160 lb per pt  % Usual Body Weight: 89%  BMI:  Body mass index is 26.24 kg/(m^2).  Estimated Nutritional Needs: Kcal: 1400-1600 Protein: 65-80g  Fluid: > 1.6 L/day  Skin: Intact   Diet Order: NPO  EDUCATION NEEDS: -No education needs identified at this time   Intake/Output Summary (Last 24 hours) at 06/19/13 0918 Last data filed at 06/19/13 0841  Gross per 24 hour  Intake 1842.59 ml  Output    200 ml  Net 1642.59 ml    Last BM: 3/20  Labs:   Recent Labs Lab 06/18/13 1150 06/18/13 2048 06/19/13 0327  NA 134* 133* 136*  K 3.2* 2.6* 3.9  CL 94* 97 100  CO2 24 23 23   BUN 11 8 10   CREATININE 0.83 0.62 0.69  CALCIUM 8.1* 7.3* 7.7*  GLUCOSE 226* 130* 97    CBG (last 3)  No results found for this basename: GLUCAP,  in the last 72 hours  Scheduled Meds: . [START ON 06/22/2013] pantoprazole (PROTONIX) IV  40 mg Intravenous Q12H  . predniSONE  50 mg Oral Q breakfast  . sodium chloride  3 mL Intravenous Q12H  Continuous Infusions: . sodium chloride 100 mL/hr at 06/19/13 0637  . pantoprozole (PROTONIX) infusion 8 mg/hr (06/19/13 3317)    Past Medical History  Diagnosis Date  . Colitis   . GERD (gastroesophageal reflux disease)   . Anemia   . Adrenal nodule   . Diverticulosis   . Hx of gastritis   . History of esophageal stricture   . Choledocholithiasis with obstruction   . Diabetes mellitus     pt denies  . Hiatal hernia   . Blood transfusion without reported diagnosis   . Cataract     bil removed  . Crohn's colitis 07/11/12    Past Surgical History  Procedure Laterality Date  . Cholecystectomy  2009  . Tubal ligation    . Cataract extraction    . Ercp   06/02/2011    Procedure: ENDOSCOPIC RETROGRADE CHOLANGIOPANCREATOGRAPHY (ERCP);  Surgeon: Lafayette Dragon, MD;  Location: [facility];  Service: Endoscopy;  Laterality: N/A;  dottie /ja  . Ercp  06/02/2011    Procedure: ENDOSCOPIC RETROGRADE CHOLANGIOPANCREATOGRAPHY (ERCP);  Surgeon: Gatha Mayer, MD;  Location: [facility];  Service: Endoscopy;  Laterality: N/A;  . Colonoscopy    . Upper gastrointestinal endoscopy      Inda Coke MS, RD, LDN Inpatient Registered Dietitian Pager: [phone] After-hours pager: [phone]

## 2013-06-19 NOTE — Progress Notes (Signed)
Utilization review completed.  

## 2013-06-19 NOTE — H&P (Signed)
FMTS Attending Admission Note: Diane Sabal MD Personal pager:  [phone] FPTS Service Pager:  [phone]  I  have seen and examined this patient, reviewed their chart. I have discussed this patient with the resident. I agree with the resident's findings, assessment and care plan.  Additionally:  68 yo F with known Crohn's disease admitted for 1 day history of hematochezia and melena.  Also with diarrhea x 1 week.  Admitted for ACS R/O about 5 days ago, states was having diarrhea at that time, thought it would resolve on its own.  Started with some vomiting plus hematochezia and melena yesterday.  5-6 episodes.  3 episodes of hematochezia overnight.  Feels tired.  Mild lower abdominal pain BL. However, once bleeding started she came to ED.  Admitted to [place]. Non-diagostic CT in ED.     Exam: Gen: Elderly female lying in bed, NAD ABd:  Soft/minimal diffuse tenderness/no guarding or rebound.   Imp/Plan:  1.  Hematochezia: - Hgb dropped but now stable ~9-10 - likely secondary to Crohn's - GI to see today for likely scope  Agree with resident note for other chronic issues.   Alveda Reasons, MD 06/19/2013 7:54 AM

## 2013-06-19 NOTE — Consult Note (Addendum)
[facility] Consult: 8:40 AM 06/19/2013  LOS: 1 day    Referring Provider: Family medicine teaching service. Dr Kennith Maes attending Primary Care Physician:  [name], MD Primary Gastroenterologist:  Dr. Delfin Edis.      Reason for Consultation:  Bleeding per rectum   HPI: Diane Freeman is a 68 y.o. female.  Hx left colonic Crohn's since 2007, GERD, iron deficiency anemia, lap chole in 2009 with ERCP in 2013 for choledocholithiasis. .   Last colonoscopy 07/2012: left sided colitis treated with sulfasalazine 100 mg daily (unable to afford Delzicol) and folic acid.  Has not taken the Sulfasalazine for at least one year if not longer.  Takes Prilosec prn, not daily.  Takes aleve 1 to 2 times per month.   GI OV 06/12/13 with vomiting episodes, cramping abdominal pain, non-bloody diarrhea 2 - 3 episodes daily.  Also having chest pain, dyspnea, jaw pain,weakness.  so referred to hospital for admission.   Admission 3/13 - 3/16 for chest pain, Crohn's flare.  Ruled out MI.  Echo with RV mass vs thrombus.  CTA neg for PE but showed right UL nodule and ? of ASD. Pneumobilia on CT attributed to previous ERCP.  Cardiac cath showed patent coronaries and no intracardiac shunting.   E coli UTI treated with 3 days Rocephin. Sent home on ASA and one week course of Prednisone 50 mg daily, 81 mg ASA.   Admitted yesterday with ongoing bleeding per rectum.  Mostly passing pure medium to dark stools, some BRB.  stooling associated with cramping pain in LLQ.  Occurring 2 to 3 times per day.  Has had about 4 since midnight today.  Nausea intermittently for 2 to 3 weeks.  Vomits yellow bilious material. Vomiting is not post prandial.   No fever or chills. Some dizziness with position shifts in last few days.  No syncope.   Hgb  11.1 on 3/15. It is as low as 9.1 (post hydration) in last 24 hours.  K is low at 3.2.  CT shows sigmoid inflammation (chronic), fatty liver. No free air.   Teaching service initiated PPI drip, prednisone continued at 50 mg daily.  No recent C diff tests.   Past Medical History  Diagnosis Date  . Colitis   . GERD (gastroesophageal reflux disease)   . Anemia   . Adrenal nodule   . Diverticulosis   . Hx of gastritis   . History of esophageal stricture   . Choledocholithiasis with obstruction   . Diabetes mellitus     pt denies  . Hiatal hernia   . Blood transfusion without reported diagnosis   . Cataract     bil removed  . Crohn's colitis 07/11/12    Past Surgical History  Procedure Laterality Date  . Cholecystectomy  2009  . Tubal ligation    . Cataract extraction    . Ercp  06/02/2011    Procedure: ENDOSCOPIC RETROGRADE CHOLANGIOPANCREATOGRAPHY (ERCP);  Surgeon: Lafayette Dragon, MD;  Location: [facility];  Service: Endoscopy;  Laterality: N/A;  dottie /ja  .  Ercp  06/02/2011    Procedure: ENDOSCOPIC RETROGRADE CHOLANGIOPANCREATOGRAPHY (ERCP);  Surgeon: Gatha Mayer, MD;  Location: [facility];  Service: Endoscopy;  Laterality: N/A;  . Colonoscopy    . Upper gastrointestinal endoscopy      Prior to Admission medications   Medication Sig Start Date End Date Taking? Authorizing Provider  aspirin EC 81 MG EC tablet Take 1 tablet (81 mg total) by mouth daily. 06/16/13  Yes Timmothy Euler, MD  folic acid (FOLVITE) 1 MG tablet Take 1 mg by mouth daily.   Yes Historical Provider, MD  omeprazole (PRILOSEC) 20 MG capsule Take 20 mg by mouth daily.   Yes Historical Provider, MD  predniSONE (DELTASONE) 50 MG tablet Take 1 tablet (50 mg total) by mouth daily with breakfast. 06/15/13  Yes Timmothy Euler, MD    Scheduled Meds: . [START ON 06/22/2013] pantoprazole (PROTONIX) IV  40 mg Intravenous Q12H  . predniSONE  50 mg Oral Q breakfast  . sodium chloride  3 mL Intravenous Q12H     Infusions: . sodium chloride 100 mL/hr at 06/19/13 0637  . pantoprozole (PROTONIX) infusion 8 mg/hr (06/19/13 0635)   PRN Meds: acetaminophen, acetaminophen, morphine injection, ondansetron (ZOFRAN) IV, ondansetron   Allergies as of 06/18/2013  . (No Known Allergies)    Family History  Problem Relation Age of Onset  . Bone cancer Maternal Grandmother   . Colon cancer Maternal Aunt 17  . Stomach cancer Sister 48    decsd  . Breast cancer Sister   . Esophageal cancer Neg Hx   . Rectal cancer Neg Hx     History   Social History  . Marital Status: Divorced    Spouse Name: N/A    Number of Children: 2  . Years of Education: N/A   Occupational History  . Not on file.   Social History Main Topics  . Smoking status: Never Smoker   . Smokeless tobacco: Never Used  . Alcohol Use: No  . Drug Use: No  . Sexual Activity: Not on file   Other Topics Concern  . Not on file   Social History Narrative  . No narrative on file    REVIEW OF SYSTEMS: Constitutional:  Weight down 10 # in 2 months ENT:  No nose bleeds Pulm:  + non-productive cough.  No hemoptysis CV:  No palpitations, no LE edema. No chest pain. GU:  No hematuria, no frequency GI:  Per HPI.  No dysphagia Heme:  Never Rxd iron.   Transfusions:  Recalls transfusion in 2013 Neuro:  No headaches, no peripheral tingling or numbness Derm:  No itching, no rash or sores.  Endocrine:  No sweats or chills.  No polyuria or dysuria Immunization:  Will not take flu shot.  Pneumovax and tdap in 2013.  Travel:  None beyond local counties in last few months.    PHYSICAL EXAM: Vital signs in last 24 hours: Filed Vitals:   06/19/13 0723  BP: 121/58  Pulse:   Temp: 97.9 F (36.6 C)  Resp: 24   Wt Readings from Last 3 Encounters:  06/18/13 65.1 kg (143 lb 8.3 oz)  06/15/13 67.132 kg (148 lb)  06/15/13 67.132 kg (148 lb)   General: pleasant, comfortable, mildly ill looking elderly WF Head:  No asymetry or  facial swelling  Eyes:  No icterus.  + conj pallor Ears:  Slightly HOH  Nose:  No discharge or congestion Mouth:  Clear , no teeth, no blood, moist MM Neck:  No mass or JVD Lungs:  Clear bil.  No cough or dyspnea Heart: RRR.  No MRG Abdomen:  Soft, minor tenderness in LLQ.  No mass.  BS active.  No tympany or tinkling BS.   Rectal: maroon bloody stool.  Non ulcerated or engorged external hemorrhoids   Musc/Skeltl: no joint redness or swelling Extremities:  No CCE  Neurologic:  Oriented x 3.  No tremor.  No limb weakness.  Good historian.  Skin:  No telangectasia or rash.  Some solar vs actinic keratosis Tattoos:  none Nodes:  No cervical adenopathy   Psych:  Pleasant, not depressed or agitated.  Relaxed.   Intake/Output from previous day: 03/19 0701 - 03/20 0700 In: 1253 [I.V.:1253] Out: -  Intake/Output this shift: Total I/O In: 589.6 [I.V.:189.6; IV Piggyback:400] Out: -   LAB RESULTS:  Recent Labs  06/18/13 1150 06/18/13 2048 06/19/13 0327  WBC 10.3 8.4 10.9*  HGB 10.0* 9.1* 9.9*  HCT 31.3* 27.2* 29.5*  PLT 305 264 294   BMET Lab Results  Component Value Date   NA 136* 06/19/2013   NA 133* 06/18/2013   NA 134* 06/18/2013   K 3.9 06/19/2013   K 2.6* 06/18/2013   K 3.2* 06/18/2013   CL 100 06/19/2013   CL 97 06/18/2013   CL 94* 06/18/2013   CO2 23 06/19/2013   CO2 23 06/18/2013   CO2 24 06/18/2013   GLUCOSE 97 06/19/2013   GLUCOSE 130* 06/18/2013   GLUCOSE 226* 06/18/2013   BUN 10 06/19/2013   BUN 8 06/18/2013   BUN 11 06/18/2013   CREATININE 0.69 06/19/2013   CREATININE 0.62 06/18/2013   CREATININE 0.83 06/18/2013   CALCIUM 7.7* 06/19/2013   CALCIUM 7.3* 06/18/2013   CALCIUM 8.1* 06/18/2013   LFT  Recent Labs  06/18/13 1150  PROT 5.7*  ALBUMIN 2.0*  AST 19  ALT 13  ALKPHOS 85  BILITOT 0.3   PT/INR Lab Results  Component Value Date   INR 1.17 06/18/2013   INR 1.13 06/15/2013   INR 1.13 06/12/2013   Hepatitis Panel No results found for this basename:  HEPBSAG, HCVAB, HEPAIGM, HEPBIGM,  in the last 72 hours  Lipase     Component Value Date/Time   LIPASE 30 06/12/2013 2259    RADIOLOGY STUDIES: Ct Abdomen Pelvis W Contrast 06/18/2013 COMPARISON:  CT scan of the abdomen and pelvis dated May 29, 2011.  FINDINGS: The stomach is partially distended with the oral contrast. There is a moderate-sized hiatal hernia/partially intrathoracic stomach. The duodenum is partially distended and grossly normal. There is contrast within the proximal and mid small bowel, but none has reached the distal ileum or colon. There is minimal distention of the small bowel loops with contrast, but no definite mural thickening or surrounding inflammatory change is demonstrated. Within the colon a normal stool and gas pattern is present in the ascending and transverse and descending colons. There is subjective mild wall thickening in the sigmoid colon especially proximally. This is demonstrated best on images 55-63. Minimal increased density in the perisigmoid fat is demonstrated. There is fluid adjacent to the right urinary bladder. Some fluid is closely applied to the mid sigmoid colon on images 74-80. There is no evidence of an abscess. The rectum does not appear abnormally distended.  The urinary bladder is partially distended but grossly normal. The uterus is vertically oriented. There is a small amount of fluid in the adnexal regions and cul de sac.  The liver exhibits decreased density  diffusely consistent with fatty infiltration. This is stable. The gallbladder is surgically absent. There is no intrahepatic ductal dilation. The pancreas, spleen, adrenal glands, and kidneys exhibit no acute abnormalities. There is a parapelvic cyst in the right kidney, and and there is an extrarenal pelvis in the left kidney. The caliber of the abdominal aorta is normal. There is no periaortic or pericaval lymphadenopathy.  The lumbar spine and bony pelvis exhibit no acute abnormalities. The  lung bases are clear.  IMPRESSION: 1. There is mild thickening of the wall of the proximal sigmoid colon which may reflect inflammatory change of the sigmoid colon given the history of Crohn's disease. There is no evidence of a perisigmoid abscess. No free extraluminal gas is demonstrated. There is no bowel obstruction or ileus. There is fluid within the pelvis adjacent to the urinary bladder. This may be inflammatory in nature. 2. There are fatty infiltrative changes of the liver. There is no acute hepatobiliary nor acute urinary tract abnormality.   Electronically Signed   By: David  Martinique   On: 06/18/2013 15:14    ENDOSCOPIC STUDIES: 07/09/2012  Colonoscopy Active left sided colitis, cecum not seen, left sided tics.  1. Surgical [P], right colon, bx - BENIGN COLONIC MUCOSA. - NO MICROSCOPIC COLITIS, ACTIVE INFLAMMATION, GRANULOMAS OR DYSPLASIA IDENTIFIED. 2. Surgical [P], descending colon @ 30-50cm, bx - CHRONIC MINIMALLY ACTIVE COLITIS WITH FEATURES OF CROHN'S COLITIS. - NO DYSPLASIA OR MALIGNANCY IDENTIFIED. 3. Surgical [P], bx @ 0-30cm - CHRONIC MINIMALLY ACTIVE COLITIS WITH FEATURES OF CROHN'S COLITIS. - NO DYSPLASIA OR MALIGNANCY IDENTIFIED.  06/2011 ERCP ENDOSCOPIC IMPRESSION: 1) Multiple CBD stones and sludge removed  after sphincterotomy.  2) diffusely dilated biliary tree s/p cholecystectomy  2009 Colonoscopy For IDA and surveillance Left sided colitis, ? Crohn's disease.   2008  EGD 530.81: GERD.  530.3: Esophageal Stricture.  535.60: Duodenitis without Hemorrhage.  535.40: Gastritis, Other.  Comments:  mild distal es. 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  Path with ? Developing IBD vs recurrent infectious colitis     IMPRESSION:   *  Bloody diarrhea.  Likely due to flare of colitis.  Diagnosed with Crohn's disease many years ago but not on any  meds for this for a few years, recently started on Prednisone without relief. BUN is not elevated as would be typical for UGI bleed.   *  Nausea and bilious emesis.  No evidence of obstruction on imaging tests.   *  ABL anemia.  Not in need of transfusion at present. Normal MCV  *  Recent negative work up for CAD but ? RV mass vs thrombus and (yet to be worked up) right lung nodule.  Started on 70 Mg ASA at that admission one week ago.   *  Hypokalemia, received IV supplementation.   *  NIDDM. No meds for this at home.  Sugars likely increased due to Prednisone.   *  Recent abx for E coli UTI   PLAN:     *  Per Dr Joanie Coddington  06/19/2013, 8:40 AM Pager: [phone]  Addendum: Patient seen, examined, and I agree with the above documentation, including the assessment and plan. Pt with hx of Crohn's colitis, but multiple other medical comorbidities, recent E coli UTI, lung nodule, RV mass, and Crohn's flare Not on recent Crohn's therapy. Bloody diarrhea could be related to active  Crohn's but would exclude infection with GI pathogen panel Has been on prednisone 50 mg daily, 40 mg oral should be sufficient, and begin mesalamine 4.8g daily May need escalation of therapy, but this can be done as an outpt. Monitor Hgb

## 2013-06-19 NOTE — Progress Notes (Signed)
FMTS ATTENDING  NOTE Ronette Deter I have discussed this patient with the resident. I agree with the resident's findings, assessment and care plan.

## 2013-06-19 NOTE — Progress Notes (Signed)
CRITICAL VALUE ALERT  Critical value received:  K 2.6  Date of notification:  06/18/13  Time of notification:  2230  Critical value read back:yes  Nurse who received alert:  K. Royden Purl  MD notified (1st page):  Adamo  Time of first page:  2235  Responding MD:  Sherril Cong  Time MD responded:  2240

## 2013-06-20 DIAGNOSIS — D649 Anemia, unspecified: Secondary | ICD-10-CM | POA: Diagnosis not present

## 2013-06-20 DIAGNOSIS — K921 Melena: Secondary | ICD-10-CM | POA: Diagnosis not present

## 2013-06-20 DIAGNOSIS — K5010 Crohn's disease of large intestine without complications: Secondary | ICD-10-CM

## 2013-06-20 DIAGNOSIS — K922 Gastrointestinal hemorrhage, unspecified: Secondary | ICD-10-CM | POA: Diagnosis not present

## 2013-06-20 LAB — CBC
HEMATOCRIT: 27.7 % — AB (ref 36.0–46.0)
HEMOGLOBIN: 9.5 g/dL — AB (ref 12.0–15.0)
MCH: 27.8 pg (ref 26.0–34.0)
MCHC: 34.3 g/dL (ref 30.0–36.0)
MCV: 81 fL (ref 78.0–100.0)
Platelets: 255 10*3/uL (ref 150–400)
RBC: 3.42 MIL/uL — ABNORMAL LOW (ref 3.87–5.11)
RDW: 15.6 % — ABNORMAL HIGH (ref 11.5–15.5)
WBC: 8.3 10*3/uL (ref 4.0–10.5)

## 2013-06-20 MED ORDER — PREDNISONE 20 MG PO TABS: ORAL_TABLET | ORAL | Status: DC

## 2013-06-20 MED ORDER — MESALAMINE 1.2 G PO TBEC: 4.8000 g | DELAYED_RELEASE_TABLET | Freq: Every day | ORAL | Status: DC

## 2013-06-20 NOTE — Progress Notes (Signed)
FMTS Attending Note  I personally saw and evaluated the patient. The plan of care was discussed with the resident team. I agree with the assessment and plan as documented by the resident.   Patient reports improvement of abdominal pain, did have slightly bloody stool this AM, tolerating diet, hemoglobin stable.  [place] for discharge home if GI has no further plans for colonoscopy. Will need close outpatient follow up.   Dossie Arbour MD

## 2013-06-20 NOTE — Discharge Summary (Signed)
[facility] Discharge Summary  Patient name: Diane Freeman Medical record number: [id number] Date of birth: 01/24/1946 Age: 68 y.o. Gender: female Date of Admission: 06/18/2013  Date of Discharge: 3/21 Admitting Physician: Alveda Reasons, MD  Primary Care Provider: [name], MD Consultants: Fabienne BrunsVelora Heckler  Indication for Hospitalization: Hematiochezia, abd pain  Discharge Diagnoses/Problem List:  Hematochezia Crohn's disease with acute flare Hypokalemia Hyperlipidemia GERD  Disposition: Home  Discharge Condition: Stable  Brief Hospital Course:  Diane Freeman is a 68 y.o. female presenting with hematochezia felt t o be related to an acute flare of her crohn's disease. PMH is significant for Crohn's disease, GERD, and recent UTI, lung nodule; recently discharged on 3/16 for chest pain and Crohn's flare (had cardiac cath done).   # Hematochezia  her blood pressure was initially low and responded well to fluids. CT of her abdomen was obtained that showed no free air, signs of abscess, but did show signs of chronic inflammation in her sigmoid colon as well as fatty liver. Her aspirin was held, her hemoglobin was followed and remained stable, and GI was consulted who started mesalamine. Her oral prednisone was continued and she was discharged to finish an additional 5 days with the following nine-day taper. On the day of discharge she was seen by GI who felt she was safe for discharge as long as she was tolerating POs well, which she was. Her aspirin was continued to be held at discharge and she has a followup appointment with GI in 2 days and with her PCP in 6 days.   # Hypokalemia:   This was resolved prior to discharge and replaced with oral and intravenous potassium during admission.  # Crohn's disease exacerbation/flare:   She was continued on prednisone to be tapered after 7 days of total treatment, started on his alanine, and has close  followup with GI.   # Hx of HLD - her statin was continued - continue statin   # GERD - she was treated with IV Protonix which was transitioned to oral Protonix and her home PPI was continued at discharge.   Issues for Follow Up:  - Consider restarting aspirin as this was held secondary to GI bleed. - Monitor for return of Crohn symptoms as she tapers from prednisone - Consider followup CBC  Significant Procedures: None  Significant Labs and Imaging:   Recent Labs Lab 06/19/13 0327 06/19/13 1616 06/20/13 0316  WBC 10.9* 7.0 8.3  HGB 9.9* 9.4* 9.5*  HCT 29.5* 28.5* 27.7*  PLT 294 248 255    Recent Labs Lab 06/14/13 1000 06/18/13 1150 06/18/13 2048 06/19/13 0327  NA 136* 134* 133* 136*  K 3.7 3.2* 2.6* 3.9  CL 103 94* 97 100  CO2 19 24 23 23   GLUCOSE 156* 226* 130* 97  BUN 14 11 8 10   CREATININE 0.58 0.83 0.62 0.69  CALCIUM 7.9* 8.1* 7.3* 7.7*  ALKPHOS  --  85  --   --   AST  --  19  --   --   ALT  --  13  --   --   ALBUMIN  --  2.0*  --   --    CT abdomen and pelvis with contrast 06/18/2013 IMPRESSION:  1. There is mild thickening of the wall of the proximal sigmoid  colon which may reflect inflammatory change of the sigmoid colon  given the history of Crohn's disease. There is no evidence of a  perisigmoid abscess.  No free extraluminal gas is demonstrated. There  is no bowel obstruction or ileus. There is fluid within the pelvis  adjacent to the urinary bladder. This may be inflammatory in nature.  2. There are fatty infiltrative changes of the liver. There is no  acute hepatobiliary nor acute urinary tract abnormality.    Results/Tests Pending at Time of Discharge:  None  Discharge Medications:    Medication List    STOP taking these medications       aspirin 81 MG EC tablet      TAKE these medications       folic acid 1 MG tablet  Commonly known as:  FOLVITE  Take 1 mg by mouth daily.     mesalamine 1.2 G EC tablet  Commonly known as:   LIALDA  Take 4 tablets (4.8 g total) by mouth daily with breakfast.     omeprazole 20 MG capsule  Commonly known as:  PRILOSEC  Take 20 mg by mouth daily.     predniSONE 20 MG tablet  Commonly known as:  DELTASONE  Take 2 tablets for 5 more days, then 1.5 tabs for 3 days, then 1 tab for 3 days, then 0.5 tab for three days then stop        Discharge Instructions: Please refer to Patient Instructions section of EMR for full details.  Patient was counseled important signs and symptoms that should prompt return to medical care, changes in medications, dietary instructions, activity restrictions, and follow up appointments.   Follow-Up Appointments: Follow-up Information   Follow up with [name], MD On 06/26/2013. (at 215 pm)    Specialty:  [facility]   Contact information:   [place] [address] [phone]       Follow up with Tye Savoy, NP On 06/22/2013. (at 930 am)    Specialty:  Nurse Practitioner   Contact information:   [address] [place] [address] [phone]       Timmothy Euler, MD 06/20/2013, 1:05 PM PGY-2, [place]

## 2013-06-20 NOTE — Discharge Instructions (Signed)
He was admitted to the hospital for blood in your stool that's due to a flare of her Crohn's disease. The GI doctors have seen you then started a new medicine for Crohn's disease and you have an appointment to see them on Monday.  Stop taking aspirin for the next week and discuss restarting this medication with your PCP.  Crohn's Disease Crohn's disease is a long-term (chronic) soreness and redness (inflammation) of the intestines (bowel). It can affect any portion of the digestive tract, from the mouth to the anus. It can also cause problems outside the digestive tract. Crohn's disease is closely related to a disease called ulcerative colitis (together, these two diseases are called inflammatory bowel disease).  CAUSES  The cause of Crohn's disease is not known. One Link Snuffer is that, in an easily affected person, the immune system is triggered to attack the body's own digestive tissue. Crohn's disease runs in families. It seems to be more common in certain geographic areas and amongst certain races. There are no clear-cut dietary causes.  SYMPTOMS  Crohn's disease can cause many different symptoms since it can affect many different parts of the body. Symptoms include:  Fatigue.  Weight loss.  Chronic diarrhea, sometime bloody.  Abdominal pain and cramps.  Fever.  Ulcers or canker sores in the mouth or rectum.  Anemia (low red blood cells).  Arthritis, skin problems, and eye problems may occur. Complications of Crohn's disease can include:  Series of holes (perforation) of the bowel.  Portions of the intestines sticking to each other (adhesions).  Obstruction of the bowel.  Fistula formation, typically in the rectal area but also in other areas. A fistula is an opening between the bowels and the outside, or between the bowels and another organ.  A painful crack in the mucous membrane of the anus (rectal fissure). DIAGNOSIS  Your caregiver may suspect Crohn's disease based on your  symptoms and an exam. Blood tests may confirm that there is a problem. You may be asked to submit a stool specimen for examination. X-rays and CT scans may be necessary. Ultimately, the diagnosis is usually made after a procedure that uses a flexible tube that is inserted via your mouth or your anus. This is done under sedation and is called either an upper endoscopy or colonoscopy. With these tests, the specialist can take tiny tissue samples and remove them from the inside of the bowel (biopsy). Examination of this biopsy tissue under a microscope can reveal Crohn's disease as the cause of your symptoms. Due to the many different forms that Crohn's disease can take, symptoms may be present for several years before a diagnosis is made. TREATMENT  Medications are often used to decrease inflammation and control the immune system. These include medicines related to aspirin, steroid medications, and newer and stronger medications to slow down the immune system. Some medications may be used as suppositories or enemas. A number of other medications are used or have been studied. Your caregiver will make specific recommendations. HOME CARE INSTRUCTIONS   Symptoms such as diarrhea can be controlled with medications. Avoid foods that have a laxative effect such as fresh fruit, vegetables and dairy products. During flare ups, you can rest your bowel by refraining from solid foods. Drink clear liquids frequently during the day (electrolyte or re-hydrating fluids are best. Your caregiver can help you with suggestions). Drink often to prevent loss of body fluids (dehydration). When diarrhea has cleared, eat small meals and more frequently. Avoid food additives and  stimulants such as caffeine (coffee, tea, or chocolate). Enzyme supplements may help if you develop intolerance to a sugar in dairy products (lactose). Ask your caregiver or dietitian about specific dietary instructions.  Try to maintain a positive attitude.  Learn relaxation techniques such as self hypnosis, mental imaging, and muscle relaxation.  If possible, avoid stresses which can aggravate your condition.  Exercise regularly.  Follow your diet.  Always get plenty of rest. SEEK MEDICAL CARE IF:   Your symptoms fail to improve after a week or two of new treatment.  You experience continued weight loss.  You have ongoing cramps or loose bowels.  You develop a new skin rash, skin sores, or eye problems. SEEK IMMEDIATE MEDICAL CARE IF:   You have worsening of your symptoms or develop new symptoms.  You have a fever.  You develop bloody diarrhea.  You develop severe abdominal pain. MAKE SURE YOU:   Understand these instructions.  Will watch your condition.  Will get help right away if you are not doing well or get worse. Document Released: 12/27/2004 Document Revised: 07/14/2012 Document Reviewed: 11/25/2006 [facility] Patient Information 2014 [place], [place].

## 2013-06-20 NOTE — Progress Notes (Signed)
Discharge instructions given to pt: verbalized understanding of follow-up appts, diet, activity level, home meds, s/s of complications and when to call MD/EMS. Diane Freeman, [name]

## 2013-06-20 NOTE — Discharge Summary (Signed)
I agree with the discharge summary as documented.   Alexandr Yaworski MD  

## 2013-06-20 NOTE — Progress Notes (Signed)
Progress Note   Subjective   Feels better. Improving. Only 1 small BM last night with some blood. Tolerated full liquids this morning.     Objective  Vital signs in last 24 hours: Temp:  [98.1 F (36.7 C)-98.7 F (37.1 C)] 98.2 F (36.8 C) (03/21 0750) Pulse Rate:  [78-91] 91 (03/21 0750) Resp:  [22-30] 22 (03/21 0750) BP: (121-148)/(64-83) 121/72 mmHg (03/21 0750) SpO2:  [96 %-100 %] 97 % (03/21 0750) Last BM Date: 06/20/13  General:   Alert, well-developed, female in NAD Heart:  Regular rate and rhythm; no murmurs Abdomen:  Soft, minimal LLQ tenderness and nondistended. Normal bowel sounds, without guarding, and without rebound.   Extremities:  Without edema. Neurologic:  Alert and  oriented x4;  grossly normal neurologically. Psych:  Alert and cooperative. Normal mood and affect.  Intake/Output from previous day: 03/20 0701 - 03/21 0700 In: 2917.9 [I.V.:2517.9; IV Piggyback:400] Out: 1550 [Urine:800; Stool:750] Intake/Output this shift:    Lab Results:  Recent Labs  06/19/13 0327 06/19/13 1616 06/20/13 0316  WBC 10.9* 7.0 8.3  HGB 9.9* 9.4* 9.5*  HCT 29.5* 28.5* 27.7*  PLT 294 248 255   BMET  Recent Labs  06/18/13 1150 06/18/13 2048 06/19/13 0327  NA 134* 133* 136*  K 3.2* 2.6* 3.9  CL 94* 97 100  CO2 24 23 23   GLUCOSE 226* 130* 97  BUN 11 8 10   CREATININE 0.83 0.62 0.69  CALCIUM 8.1* 7.3* 7.7*   LFT  Recent Labs  06/18/13 1150  PROT 5.7*  ALBUMIN 2.0*  AST 19  ALT 13  ALKPHOS 85  BILITOT 0.3   PT/INR  Recent Labs  06/18/13 1150  LABPROT 14.7  INR 1.17    Studies/Results: Ct Abdomen Pelvis W Contrast  06/18/2013   EXAM: CT ABDOMEN AND PELVIS WITH CONTRAST  TECHNIQUE: Multidetector CT imaging of the abdomen and pelvis was performed using the standard protocol following bolus administration of intravenous contrast.  CONTRAST:  165m OMNIPAQUE IOHEXOL 300 MG/ML SOLN intravenously; the patient also received oral contrast material.   COMPARISON:  CT scan of the abdomen and pelvis dated May 29, 2011.  FINDINGS: The stomach is partially distended with the oral contrast. There is a moderate-sized hiatal hernia/partially intrathoracic stomach. The duodenum is partially distended and grossly normal. There is contrast within the proximal and mid small bowel, but none has reached the distal ileum or colon. There is minimal distention of the small bowel loops with contrast, but no definite mural thickening or surrounding inflammatory change is demonstrated. Within the colon a normal stool and gas pattern is present in the ascending and transverse and descending colons. There is subjective mild wall thickening in the sigmoid colon especially proximally. This is demonstrated best on images 55-63. Minimal increased density in the perisigmoid fat is demonstrated. There is fluid adjacent to the right urinary bladder. Some fluid is closely applied to the mid sigmoid colon on images 74-80. There is no evidence of an abscess. The rectum does not appear abnormally distended.  The urinary bladder is partially distended but grossly normal. The uterus is vertically oriented. There is a small amount of fluid in the adnexal regions and cul de sac.  The liver exhibits decreased density diffusely consistent with fatty infiltration. This is stable. The gallbladder is surgically absent. There is no intrahepatic ductal dilation. The pancreas, spleen, adrenal glands, and kidneys exhibit no acute abnormalities. There is a parapelvic cyst in the right kidney, and and there is  an extrarenal pelvis in the left kidney. The caliber of the abdominal aorta is normal. There is no periaortic or pericaval lymphadenopathy.  The lumbar spine and bony pelvis exhibit no acute abnormalities. The lung bases are clear.  IMPRESSION: 1. There is mild thickening of the wall of the proximal sigmoid colon which may reflect inflammatory change of the sigmoid colon given the history of  Crohn's disease. There is no evidence of a perisigmoid abscess. No free extraluminal gas is demonstrated. There is no bowel obstruction or ileus. There is fluid within the pelvis adjacent to the urinary bladder. This may be inflammatory in nature. 2. There are fatty infiltrative changes of the liver. There is no acute hepatobiliary nor acute urinary tract abnormality.   Electronically Signed   By: David  Martinique   On: 06/18/2013 15:14      Assessment & Plan   1. Hx of Crohn's colitis with multiple other medical comorbidities, recent E coli UTI, lung nodule, RV mass, and Crohn's flare. Not on recent Crohn's therapy.  Bloody diarrhea is significantly improved, likely related to active Crohn's. GI pathogen panel was not sent as her diarrhea resolved.  Continue Prednisone 40 mg daily and mesalamine 4.8g daily at discharge. Hb stable at 9.5 OK for discharge later today if she tolerates low residue diet. OP follow up with Dr. Delfin Edis as scheduled next week. GI signing off.     LOS: 2 days   Norberto Sorenson T. Fuller Plan MD  06/20/2013, 9:49 AM

## 2013-06-20 NOTE — Progress Notes (Signed)
Family Medicine Teaching Service Daily Progress Note Intern Pager: [phone]  Patient name: Diane Freeman Medical record number: [id number] Date of birth: 26-Nov-1945 Age: 68 y.o. Gender: female  Primary Care Provider: [name], MD Consultants: GI Code Status: Full  Pt Overview and Major Events to Date:   Assessment and Plan: Diane Freeman is a 68 y.o. female presenting with hematochezia. PMH is significant for Crohn's disease, GERD, and recent UTI, lung nodule; recently discharged on 3/16 for chest pain and Crohn's flare (had cardiac cath done).   # Hematochezia: Vitals initially with low BP responsive to fluids. CT abdomen with no free air, sign of abscess, has chronic inflammation in sigmoid from Crohn's, fatty infiltrate of liver. Suspect in setting of recent daily aspirin has had bleed from uncovered pathology from Crohn's, AVM vs fistula vs angiodysplasia, diverticulosis.  - Most likely etiology appears to be acute flare of IBD - telemetry, vitals per unit, close monitoring of BP. If worsening anemia/hemodynamics,  - IV protonix bolus and drip >> PO protonix 40 mg qd - IVF NS 100cc/hr >>SLIV - GI consulting- no c scope necessary, can DC after tolerating PO   # Hypokalemia: resolved - repleted with k-dur and IV potassium runs; repeat this morning 3.9  # Crohn's disease exacerbation/flare: recently discharged for Crohn's flare, started on prednisone and continued as outpatient for 7 day course  - PO Prednisone 50 mg daily, will taper after 7 days  # Hx of HLD  - continue statin   # GERD  - Protonix daily   FEN/GI: NPO, NS 100cc/hr  Prophylaxis: SCDs  Disposition: pending GI eval, clinical improvement  Subjective:  1 additional bloody stool but much improved. Tolerated breakfast well.   Objective: Temp:  [98.1 F (36.7 C)-98.7 F (37.1 C)] 98.3 F (36.8 C) (03/21 1132) Pulse Rate:  [78-91] 81 (03/21 1132) Resp:  [21-30] 21 (03/21 1132) BP:  (110-148)/(54-83) 110/54 mmHg (03/21 1132) SpO2:  [96 %-100 %] 98 % (03/21 1132) Physical Exam: General: NAD Cardiovascular: RRR, normal heart sounds, no murmurs Respiratory: CTAB, normal effort Abdomen: soft, obese, tender to lower quadrants bilaterally, no rebound or guarding Extremities: no edema or cyanosis, wwp Neuro: alert and oriented, no focal deficits  Laboratory:  Recent Labs Lab 06/19/13 0327 06/19/13 1616 06/20/13 0316  WBC 10.9* 7.0 8.3  HGB 9.9* 9.4* 9.5*  HCT 29.5* 28.5* 27.7*  PLT 294 248 255    Recent Labs Lab 06/18/13 1150 06/18/13 2048 06/19/13 0327  NA 134* 133* 136*  K 3.2* 2.6* 3.9  CL 94* 97 100  CO2 24 23 23   BUN 11 8 10   CREATININE 0.83 0.62 0.69  CALCIUM 8.1* 7.3* 7.7*  PROT 5.7*  --   --   BILITOT 0.3  --   --   ALKPHOS 85  --   --   ALT 13  --   --   AST 19  --   --   GLUCOSE 226* 130* 97      Imaging/Diagnostic Tests:  Ct Abdomen Pelvis W Contrast  06/18/2013     IMPRESSION: 1. There is mild thickening of the wall of the proximal sigmoid colon which may reflect inflammatory change of the sigmoid colon given the history of Crohn's disease. There is no evidence of a perisigmoid abscess. No free extraluminal gas is demonstrated. There is no bowel obstruction or ileus. There is fluid within the pelvis adjacent to the urinary bladder. This may be inflammatory in nature. 2. There are fatty  infiltrative changes of the liver. There is no acute hepatobiliary nor acute urinary tract abnormality.   Electronically Signed   By: David  Martinique   On: 06/18/2013 15:14    Timmothy Euler, MD 06/20/2013, 12:02 PM PGY-2, [place] Intern pager: [phone], text pages welcome

## 2013-06-22 ENCOUNTER — Encounter: Payer: Self-pay | Admitting: *Deleted

## 2013-06-23 ENCOUNTER — Ambulatory Visit: Payer: Medicare Other | Admitting: Nurse Practitioner

## 2013-06-23 ENCOUNTER — Encounter: Payer: Self-pay | Admitting: Internal Medicine

## 2013-06-23 ENCOUNTER — Ambulatory Visit (INDEPENDENT_AMBULATORY_CARE_PROVIDER_SITE_OTHER): Payer: Medicare Other | Admitting: Internal Medicine

## 2013-06-23 VITALS — BP 126/62 | HR 60 | Ht 62.25 in | Wt 138.2 lb

## 2013-06-23 DIAGNOSIS — K5090 Crohn's disease, unspecified, without complications: Secondary | ICD-10-CM | POA: Diagnosis not present

## 2013-06-23 DIAGNOSIS — K625 Hemorrhage of anus and rectum: Secondary | ICD-10-CM | POA: Diagnosis not present

## 2013-06-23 MED ORDER — TANDEM PLUS 162-115.2-1 MG PO CAPS: 1.0000 | ORAL_CAPSULE | Freq: Three times a day (TID) | ORAL | Status: DC

## 2013-06-23 MED ORDER — PREDNISONE 10 MG PO TABS: 10.0000 mg | ORAL_TABLET | Freq: Every day | ORAL | Status: DC

## 2013-06-23 NOTE — Progress Notes (Signed)
Diane Freeman 07-04-1945 [id number]  Note: This dictation was prepared with Dragon digital system. Any transcriptional errors that result from this procedure are unintentional.   History of Present Illness:  This is a 68 year old white female with Crohn's colitis of 7 years duration. She had a recent flareup requiring hospitalization from 06/18/13- 06/20/2013 for rectal bleeding and diarrhea. A CT scan of the abdomen showed left-sided colitis. Her last colonoscopy in April 2014 showed active colitis in the sigmoid colon and she was treated with Mesalamine and Prednisone taper, but stopped taking her medication. Prior colonoscopies were in 2007 and 2009. She had choledocholithiasis in 2013 and underwent an ERCP sphincterotomy and removal of stones by Dr. Carlean Purl. She is currently on 40 mg of prednisone for the past 3 days and is already feeling much better. She is also on mesalamine 3.6 g daily. Her hemoglobin on discharge was 9.9 and hematocrit was 29.5.    Past Medical History  Diagnosis Date  . Colitis   . GERD (gastroesophageal reflux disease)   . Anemia   . Adrenal nodule   . Diverticulosis   . Hx of gastritis   . History of esophageal stricture   . Choledocholithiasis with obstruction   . Diabetes mellitus     pt denies  . Hiatal hernia   . Blood transfusion without reported diagnosis   . Cataract     bil removed  . Crohn's colitis 07/11/12  . Fatty liver     Past Surgical History  Procedure Laterality Date  . Cholecystectomy  2009  . Tubal ligation    . Cataract extraction    . Ercp  06/02/2011    Procedure: ENDOSCOPIC RETROGRADE CHOLANGIOPANCREATOGRAPHY (ERCP);  Surgeon: Lafayette Dragon, MD;  Location: [facility];  Service: Endoscopy;  Laterality: N/A;  dottie /ja  . Ercp  06/02/2011    Procedure: ENDOSCOPIC RETROGRADE CHOLANGIOPANCREATOGRAPHY (ERCP);  Surgeon: Gatha Mayer, MD;  Location: [facility];  Service: Endoscopy;  Laterality: N/A;  . Colonoscopy    . Upper  gastrointestinal endoscopy    . Cardiac catheterization  05/2013    No Known Allergies  Family history and social history have been reviewed.  Review of Systems: Denies nausea vomiting heartburn  The remainder of the 10 point ROS is negative except as outlined in the H&P  Physical Exam: General Appearance Well developed, in no distress Eyes  Non icteric  HEENT  Non traumatic, normocephalic  Mouth No lesion, tongue papillated, no cheilosis Neck Supple without adenopathy, thyroid not enlarged, no carotid bruits, no JVD Lungs Clear to auscultation bilaterally COR Normal S1, normal S2, regular rhythm, no murmur, quiet precordium Abdomen soft with normoactive bowel sounds. Relaxed. Mild tenderness left lower quadrant. No CVA tenderness Rectal soft Hemoccult negative stool Extremities  No pedal edema Skin No lesions Neurological Alert and oriented x 3 Psychological Normal mood and affect  Assessment and Plan:   Problem #1 Crohn's colitis, predominantly in the left colon. She is responding to prednisone taper. She will stay on 40 mg for 2 weeks, 30 mg for 2 weeks, 20 mg for 2 weeks and then by 5 mg every 2 weeks. I will see her in 6 weeks. She will continue on mesalamine 3.6 g daily ( Lialda samples given). When she runs out all the samples so we will try to put her onDelzicol.  She will get ferrous sulfate 325 mg 3 times a day as well. We will check her blood count next time she comes in.  Delfin Edis 06/23/2013

## 2013-06-23 NOTE — Patient Instructions (Addendum)
Dr Olevia Perches has advised that you be on a prednisone taper. The taper instructions are as follows: Take 2 tablets twice daily (40 mg) x 2 weeks Take 3 tablets daily (30 mg) x 2 weeks Take 2 tablets daily (20 mg) x 2 weeks Take 1.5 tablets daily (15 mg) x 2 weeks Take 1 tablet (10 mg) x 2 weeks Take 0.5 tablet (5 mg) x 2 weeks  We have sent the following medications to your pharmacy for you to pick up at your convenience: Prednisone Tandem  Please follow up with Dr Olevia Perches in 6 weeks.  Please contact your insurance company to find out which mesalamine product they prefer you take (ex. Lialda, Asacol HD, Delzicol, Apriso). You can also ask them about sulfasalazine or balsalazide.  CC:Dr [name]

## 2013-06-24 ENCOUNTER — Telehealth: Payer: Self-pay | Admitting: *Deleted

## 2013-06-24 NOTE — Telephone Encounter (Signed)
Per Dr. Wendi Snipes medication was prescribed through GI department.  Derl Barrow, RN

## 2013-06-24 NOTE — Telephone Encounter (Signed)
Prior Authorization received from [facility] for [address] DR 1.2 GM tablet. Apriso, Mesalamine and Mesalamine Kit are formulary change that does not require PA.  PA form placed in provider box for completion. Derl Barrow, RN

## 2013-06-26 ENCOUNTER — Encounter (HOSPITAL_COMMUNITY): Payer: Self-pay | Admitting: Emergency Medicine

## 2013-06-26 ENCOUNTER — Emergency Department (HOSPITAL_COMMUNITY): Payer: Medicare Other

## 2013-06-26 ENCOUNTER — Observation Stay (HOSPITAL_COMMUNITY): Payer: Medicare Other

## 2013-06-26 ENCOUNTER — Observation Stay (HOSPITAL_COMMUNITY)
Admission: EM | Admit: 2013-06-26 | Discharge: 2013-06-27 | Disposition: A | Discharge status: Home / Self Care | Payer: Medicare Other | Attending: Family Medicine | Admitting: Family Medicine

## 2013-06-26 ENCOUNTER — Ambulatory Visit (INDEPENDENT_AMBULATORY_CARE_PROVIDER_SITE_OTHER): Payer: Medicare Other | Admitting: Family Medicine

## 2013-06-26 DIAGNOSIS — M6281 Muscle weakness (generalized): Secondary | ICD-10-CM | POA: Insufficient documentation

## 2013-06-26 DIAGNOSIS — I2510 Atherosclerotic heart disease of native coronary artery without angina pectoris: Secondary | ICD-10-CM | POA: Insufficient documentation

## 2013-06-26 DIAGNOSIS — R609 Edema, unspecified: Secondary | ICD-10-CM | POA: Diagnosis not present

## 2013-06-26 DIAGNOSIS — S0081XA Abrasion of other part of head, initial encounter: Secondary | ICD-10-CM

## 2013-06-26 DIAGNOSIS — S0120XA Unspecified open wound of nose, initial encounter: Secondary | ICD-10-CM | POA: Insufficient documentation

## 2013-06-26 DIAGNOSIS — K219 Gastro-esophageal reflux disease without esophagitis: Secondary | ICD-10-CM | POA: Diagnosis not present

## 2013-06-26 DIAGNOSIS — E785 Hyperlipidemia, unspecified: Secondary | ICD-10-CM | POA: Diagnosis not present

## 2013-06-26 DIAGNOSIS — R911 Solitary pulmonary nodule: Secondary | ICD-10-CM | POA: Diagnosis not present

## 2013-06-26 DIAGNOSIS — I509 Heart failure, unspecified: Secondary | ICD-10-CM | POA: Diagnosis not present

## 2013-06-26 DIAGNOSIS — K2100 Gastro-esophageal reflux disease with esophagitis, without bleeding: Secondary | ICD-10-CM | POA: Diagnosis present

## 2013-06-26 DIAGNOSIS — S065X0A Traumatic subdural hemorrhage without loss of consciousness, initial encounter: Principal | ICD-10-CM | POA: Insufficient documentation

## 2013-06-26 DIAGNOSIS — K5090 Crohn's disease, unspecified, without complications: Secondary | ICD-10-CM | POA: Diagnosis not present

## 2013-06-26 DIAGNOSIS — I5030 Unspecified diastolic (congestive) heart failure: Secondary | ICD-10-CM | POA: Insufficient documentation

## 2013-06-26 DIAGNOSIS — IMO0002 Reserved for concepts with insufficient information to code with codable children: Secondary | ICD-10-CM | POA: Diagnosis not present

## 2013-06-26 DIAGNOSIS — E119 Type 2 diabetes mellitus without complications: Secondary | ICD-10-CM | POA: Diagnosis not present

## 2013-06-26 DIAGNOSIS — Y9289 Other specified places as the place of occurrence of the external cause: Secondary | ICD-10-CM | POA: Insufficient documentation

## 2013-06-26 DIAGNOSIS — S065XAA Traumatic subdural hemorrhage with loss of consciousness status unknown, initial encounter: Secondary | ICD-10-CM

## 2013-06-26 DIAGNOSIS — I6200 Nontraumatic subdural hemorrhage, unspecified: Secondary | ICD-10-CM

## 2013-06-26 DIAGNOSIS — S0181XA Laceration without foreign body of other part of head, initial encounter: Secondary | ICD-10-CM

## 2013-06-26 DIAGNOSIS — S022XXA Fracture of nasal bones, initial encounter for closed fracture: Secondary | ICD-10-CM

## 2013-06-26 DIAGNOSIS — D649 Anemia, unspecified: Secondary | ICD-10-CM | POA: Diagnosis not present

## 2013-06-26 DIAGNOSIS — S0180XA Unspecified open wound of other part of head, initial encounter: Secondary | ICD-10-CM | POA: Diagnosis not present

## 2013-06-26 DIAGNOSIS — R5381 Other malaise: Secondary | ICD-10-CM | POA: Diagnosis not present

## 2013-06-26 DIAGNOSIS — E559 Vitamin D deficiency, unspecified: Secondary | ICD-10-CM | POA: Insufficient documentation

## 2013-06-26 DIAGNOSIS — S065X9A Traumatic subdural hemorrhage with loss of consciousness of unspecified duration, initial encounter: Secondary | ICD-10-CM | POA: Diagnosis present

## 2013-06-26 DIAGNOSIS — W010XXA Fall on same level from slipping, tripping and stumbling without subsequent striking against object, initial encounter: Secondary | ICD-10-CM | POA: Insufficient documentation

## 2013-06-26 MED ORDER — SODIUM CHLORIDE 0.9 % IV SOLN
INTRAVENOUS | Status: AC
  Administered 2013-06-26: 20:00:00 via INTRAVENOUS

## 2013-06-26 MED ORDER — ACETAMINOPHEN 325 MG PO TABS
650.0000 mg | ORAL_TABLET | Freq: Four times a day (QID) | ORAL | Status: DC | PRN
  Administered 2013-06-26: 650 mg via ORAL
  Filled 2013-06-26: qty 2

## 2013-06-26 MED ORDER — PREDNISONE 20 MG PO TABS
20.0000 mg | ORAL_TABLET | Freq: Two times a day (BID) | ORAL | Status: DC
  Administered 2013-06-27: 20 mg via ORAL
  Filled 2013-06-26 (×3): qty 1

## 2013-06-26 MED ORDER — ACETAMINOPHEN 650 MG RE SUPP: 650.0000 mg | Freq: Four times a day (QID) | RECTAL | Status: DC | PRN

## 2013-06-26 MED ORDER — SODIUM CHLORIDE 0.9 % IJ SOLN
3.0000 mL | Freq: Two times a day (BID) | INTRAMUSCULAR | Status: DC
  Administered 2013-06-26: 3 mL via INTRAVENOUS

## 2013-06-26 MED ORDER — FOLIC ACID 1 MG PO TABS
1.0000 mg | ORAL_TABLET | Freq: Every day | ORAL | Status: DC
  Administered 2013-06-27: 1 mg via ORAL
  Filled 2013-06-26: qty 1

## 2013-06-26 MED ORDER — ADULT MULTIVITAMIN W/MINERALS CH
1.0000 | ORAL_TABLET | Freq: Every day | ORAL | Status: DC
  Administered 2013-06-27: 1 via ORAL
  Filled 2013-06-26: qty 1

## 2013-06-26 MED ORDER — PREDNISONE 20 MG PO TABS
20.0000 mg | ORAL_TABLET | Freq: Three times a day (TID) | ORAL | Status: DC
  Filled 2013-06-26: qty 1

## 2013-06-26 MED ORDER — MESALAMINE 400 MG PO CPDR
1200.0000 mg | DELAYED_RELEASE_CAPSULE | Freq: Three times a day (TID) | ORAL | Status: DC
  Administered 2013-06-26: 400 mg via ORAL
  Administered 2013-06-27: 1200 mg via ORAL
  Filled 2013-06-26 (×4): qty 3

## 2013-06-26 MED ORDER — PANTOPRAZOLE SODIUM 40 MG PO TBEC
40.0000 mg | DELAYED_RELEASE_TABLET | Freq: Every day | ORAL | Status: DC
  Administered 2013-06-27: 40 mg via ORAL
  Filled 2013-06-26: qty 1

## 2013-06-26 MED ORDER — TANDEM PLUS 162-115.2-1 MG PO CAPS: 1.0000 | ORAL_CAPSULE | Freq: Three times a day (TID) | ORAL | Status: DC

## 2013-06-26 NOTE — ED Notes (Signed)
Pt reports that she was headed to her PCP appt and tripped on some limbs. Denies any LOC, reports that she was sent here for x-rays. No blood thinners. Abrasion to the nose

## 2013-06-26 NOTE — ED Notes (Signed)
Pt reports falling outside her doctor's office. Pt denies loss of consciousness. Suffered scrapes to left forehead, left cheek, and bridge of nose. Pt rates face pain as 5 out of 10. Pt denies any other pain. Pt denies dizziness. Pt A&O and in NAD.

## 2013-06-26 NOTE — ED Provider Notes (Signed)
CSN: [id number]     Arrival date & time 06/26/13  1500 History   First MD Initiated Contact with Patient 06/26/13 1623     Chief Complaint  Patient presents with  . Fall     (Consider location/radiation/quality/duration/timing/severity/associated sxs/prior Treatment) Patient is a 68 y.o. female presenting with fall. The history is provided by the patient.  Fall   She was going to her doctor's office, when she tripped and fell. She feels like a branch, on the ground,  caused her to trip and fall. She did not lose consciousness. She was able to get up and walk into her doctor's office, where she was seen briefly, then sent her, for evaluation. She didn't want to see her doctor because of hospitalization recently discharged one week ago, after a Crohn's flare, and chest pain. She does not take blood thinners. In the last few days, she has not had abdominal pain, weakness, or dizziness. She is taking her medications, as directed. There are no other known modifying factors.    Past Medical History  Diagnosis Date  . Colitis   . GERD (gastroesophageal reflux disease)   . Anemia   . Adrenal nodule   . Diverticulosis   . Hx of gastritis   . History of esophageal stricture   . Choledocholithiasis with obstruction   . Diabetes mellitus     pt denies  . Hiatal hernia   . Blood transfusion without reported diagnosis   . Cataract     bil removed  . Crohn's colitis 07/11/12  . Fatty liver    Past Surgical History  Procedure Laterality Date  . Cholecystectomy  2009  . Tubal ligation    . Cataract extraction    . Ercp  06/02/2011    Procedure: ENDOSCOPIC RETROGRADE CHOLANGIOPANCREATOGRAPHY (ERCP);  Surgeon: Lafayette Dragon, MD;  Location: [facility];  Service: Endoscopy;  Laterality: N/A;  dottie /ja  . Ercp  06/02/2011    Procedure: ENDOSCOPIC RETROGRADE CHOLANGIOPANCREATOGRAPHY (ERCP);  Surgeon: Gatha Mayer, MD;  Location: [facility];  Service: Endoscopy;  Laterality: N/A;  .  Colonoscopy    . Upper gastrointestinal endoscopy    . Cardiac catheterization  05/2013   Family History  Problem Relation Age of Onset  . Bone cancer Maternal Grandmother   . Colon cancer Maternal Aunt 12  . Stomach cancer Sister 84    decsd  . Breast cancer Sister   . Esophageal cancer Neg Hx   . Rectal cancer Neg Hx    History  Substance Use Topics  . Smoking status: Never Smoker   . Smokeless tobacco: Never Used  . Alcohol Use: No   OB History   Grav Para Term Preterm Abortions TAB SAB Ect Mult Living                 Review of Systems  All other systems reviewed and are negative.      Allergies  Review of patient's allergies indicates no known allergies.  Home Medications   Current Outpatient Rx  Name  Route  Sig  Dispense  Refill  . FeFum-FePo-FA-B Cmp-C-Zn-Mn-Cu (TANDEM PLUS) 162-115.2-1 MG CAPS   Oral   Take 1 tablet by mouth 3 (three) times daily.   111 each   0   . folic acid (FOLVITE) 1 MG tablet   Oral   Take 1 mg by mouth daily.         . Mesalamine (DELZICOL) 400 MG CPDR DR capsule   Oral  Take 1,200 mg by mouth 3 (three) times daily.         Marland Kitchen omeprazole (PRILOSEC) 20 MG capsule   Oral   Take 20 mg by mouth daily.         . predniSONE (DELTASONE) 20 MG tablet   Oral   Take 20 mg by mouth 3 (three) times daily with meals.          BP 121/95  Pulse 69  Temp(Src) 98 F (36.7 C) (Oral)  Resp 20  Wt 142 lb 1 oz (64.439 kg)  SpO2 100% Physical Exam  Nursing note and vitals reviewed. Constitutional: She is oriented to person, place, and time. She appears well-developed and well-nourished.  HENT:  Head: Normocephalic.  Abrasions forehead and face and laceration (gapping) over the bridge of the nose. No significant. No swelling or bruising, on initial evaluation  Eyes: Conjunctivae and EOM are normal. Pupils are equal, round, and reactive to light.  Neck: Normal range of motion and phonation normal. Neck supple.   Cardiovascular: Normal rate, regular rhythm and intact distal pulses.   Pulmonary/Chest: Effort normal and breath sounds normal. She exhibits no tenderness.  Abdominal: Soft. She exhibits no distension. There is no tenderness. There is no guarding.  Musculoskeletal: Normal range of motion.  Neurological: She is alert and oriented to person, place, and time. She exhibits normal muscle tone.  Skin: Skin is warm and dry.  Psychiatric: She has a normal mood and affect. Her behavior is normal. Judgment and thought content normal.    ED Course  Procedures (including critical care time)  Medications - No data to display  Patient Vitals for the past 24 hrs:  BP Temp Temp src Pulse Resp SpO2 Weight  06/26/13 1645 121/95 mmHg - - 69 20 100 % -  06/26/13 1515 134/76 mmHg 98 F (36.7 C) Oral 67 16 99 % 142 lb 1 oz (64.439 kg)    4:27 PM Reevaluation with update and discussion. After initial assessment and treatment, an updated evaluation reveals no further c/o. Diane Freeman L   Nasal laceration repair by NPP.  16:31 PM-Consult complete with Neurosurgery- [place]. Patient case explained and discussed. He reviewed the images and states that the patient can be managed expectantly. He will see as needed. Call ended at 17:40  5:34 PM-Consult complete with resident on call. Patient case explained and discussed. She agrees to admit patient for further evaluation and treatment. Call ended at 17:40    Admission per PCP.   Labs Review Labs Reviewed - No data to display Imaging Review Ct Head Wo Contrast  06/26/2013   CLINICAL DATA:  Tripped and fall.  No loss of consciousness.  EXAM: CT HEAD WITHOUT CONTRAST  TECHNIQUE: Contiguous axial images were obtained from the base of the skull through the vertex without intravenous contrast.  COMPARISON:  None available for comparison at time of study interpretation.  FINDINGS: The ventricles and sulci are normal for age. No intraparenchymal hemorrhage,  mass effect nor midline shift. Patchy supratentorial white matter hypodensities are within normal range for patient's age and though non-specific suggest sequelae of chronic small vessel ischemic disease. No acute large vascular territory infarcts.  3 mm left parafalcine hyperdensity concerning for subdural hematoma. Basal cisterns are patent. Mild calcific atherosclerosis of the carotid siphons.  Mildly depressed left nasal bone fracture with soft tissue swelling may be acute. No skull fracture. Visualized paranasal sinuses and mastoid aircells are well-aerated. The included ocular globes and orbital contents are non-suspicious. Left  ocular lens implant. Edentulous.  IMPRESSION: 3 mm left parafalcine hyperdensity concerning for small subdural hematoma without mass effect. Recommend follow-up CT of the head in 6 hr to verify stability of findings.  Minimally depressed left nasal bone fracture may be acute.  Critical Value/emergent results were called by telephone at the time of interpretation on 06/26/2013 at 4:19 PM to Dr. Daleen Bo , who verbally acknowledged these results.   Electronically Signed   By: Elon Alas   On: 06/26/2013 16:19     EKG Interpretation None      MDM   Final diagnoses:  Subdural hematoma  Abrasion of face  Laceration of face    Fall with subdural hematoma and normal. Neurologic status. Minor injuries to face. Patient will require overnight observation for monitoring.  Nursing Notes Reviewed/ Care Coordinated Applicable Imaging Reviewed Interpretation of Laboratory Data incorporated into ED treatment  Plan:admit     Diane Blade, MD 06/26/13 1749

## 2013-06-26 NOTE — Patient Instructions (Signed)
Nice to see you. We are going to send you to the ED for evaluation of your face after your fall. Please follow-up with me in the next couple of weeks.

## 2013-06-26 NOTE — Telephone Encounter (Signed)
Received another fax request for PA for Lialda DR 1.2 GM tab from [facility].  Request was faxed to GI department on 06/24/13.  Derl Barrow, RN

## 2013-06-26 NOTE — ED Notes (Signed)
Pt IV placed in L. AC. Pt difficult stick. Blood returned with IV start, No swelling, pt denied pain or discomfort to site.

## 2013-06-26 NOTE — ED Provider Notes (Signed)
LACERATION REPAIR Performed by: Linus Mako Authorized by: Linus Mako Consent: Verbal consent obtained. Risks and benefits: risks, benefits and alternatives were discussed Consent given by: patient Patient identity confirmed: provided demographic data Prepped and Draped in normal sterile fashion Wound explored  Laceration Location: nasal bridge  Laceration Length: 1.5 cm  No Foreign Bodies seen or palpated  Anesthesia: local infiltration  Local anesthetic: lidocaine 2 % wo epinephrine  Anesthetic total: 2 ml  Irrigation method: syringe Amount of cleaning: standard  Skin closure: sutures  Number of sutures: 3  Technique: simple interrupted  Patient tolerance: Patient tolerated the procedure well with no immediate complications.  For HPI, ROS, PE and dispo, please refer to Dr. Beatriz Chancellor note.     Linus Mako, PA-C 06/26/13 [phone]

## 2013-06-26 NOTE — Progress Notes (Signed)
Patient ID: Diane Freeman, female   DOB: 12/21/1945, 68 y.o.   MRN: [id number]  [name], MD Phone: [phone]  Diane Freeman is a 68 y.o. female who presents today for hospital f/u, though on way in to the office fell in the parking lot.  Fall: patient was coming in to the office for f/u visit and reports was walking across the median in the parking lot when she fell, she is not sure what caused her to fall. She landed on her face and was noted to be bleeding. Another patient that was leaving the office noted that she was bleeding and came back in to the office to let the staff know that someone was bleeding outside. She was immediately brought in to the office by the nursing staff and pressure was applied to the wound. She reported mild pain at the site of her laceration on the bridge of her nose, otherwise had no complaints. She had not lost consciousness during this episode. She felt that she either tripped over a branch or her own feet. She felt that her face landed on her purse or her arm, though she states she is not sure exactly what she landed on.   Patient is a nonsmoker.   ROS: Per HPI   Physical Exam  Physical Examination: General appearance - alert, well appearing, and in no distress Eyes - pupils equal and reactive, extraocular eye movements intact Nose - nares patent, nasal bridge with ~1 cm dog ear laceration over the middle of the bridge that is bleeding, there is swelling around the area, there is palpable bony abnormality just lateral bilaterally to the laceration Face - under left eye the patient has bruising and swelling without any palpable bony abnormalities  Assessment/Plan: Please see individual problem list.

## 2013-06-26 NOTE — H&P (Signed)
[facility] Admission History and Physical Service Pager: [phone]  Patient name: Diane Freeman Medical record number: [id number] Date of birth: 1945-12-20 Age: 68 y.o. Gender: female  Primary Care Provider: [name], MD Consultants: Neurosurg Code Status: FULL per discussion  Chief Complaint: Fall hitting head  Assessment and Plan: Diane Freeman is a 68 y.o. female presenting after a fall. PMH is significant for crohn disease with recent exacerbation, anemia, incidentally found lung nodule, HLD, DM, and GERD.  # Fall hitting head - Vitals stable. Reportedly patient tripped on a stump in parking lot of clinic, no symptoms prior to make fall seem to have been hemodynamic or neurologic in nature. Head CT with subdural hematoma, ED physician discussed with neurosurgery in ED with no planned intervention. Neuro exam normal. - Admit to observation on telemetry, attending Dr Diane Freeman. - Repeat head CT ~11 pm to (6-7 hours) to verify stability of findings. - PT/OT consulted due to reported leg deconditioning with multiple hospitalizations. - EKG and telemetry to r/o cardiac; orthostatics. - F/u neurosurg recs.  # Nasal bridge fracture - Minimally depressed fracture on CT. 3 stitches placed in ED. - Will not treat unless patient feels it is misshapen. - Minimal pain at this time - tylenol PRN. - Remove stitches in 5-7 days (4/2-3).  # Crohns recent exacerbation/flare: seen by Dr Diane Freeman (GI) 3/24 and recommended continuing prednisone taper (40 mg for 2 weeks, 30 mg for 2 weeks, 20 mg for 2 weeks and then by 5 mg every 2 weeks.). - F/u with Dr Diane Freeman 6 weeks - Continue mesalamine 3.6g daily - Continue prednisone 48m BID with planned taper above. - Continue Tandem Plus vitamin.  # Anemia: Recent hematochezia, none since discharge. Per GI: - continue iron 3264mTID - Recheck CBC tomorrow  # H/o HLD - Continue statin. - restart asa in future with  recent hematochezia.  # GERD - Stable - Continue oral PPI  # Recent admission for chest pain with bilateral heart cath 06/15/13 showing patent coronary arteries with minimal nonobstructive CAD.  - CP previously thought noncardiac.  FEN/GI: Regular diet, SLIV Prophylaxis: SCDs due to subdural hematoma.  Disposition: Admit to tele for observation.  History of Present Illness: Diane SHEPARDSONs a [age].o. female presenting with Fall today.   Patient reports walking into clinic today and tripping on roots outside. Denies dizziness, syncope, dyspnea, chest pain, or other symptoms prior to falling. Face broke her fall and she has scratches on her left forehead and cheekbone. Nasal bridge was bleeding. Patient denies neurologic changes (speech, gait, strength, vision, hearing, consciousness) after the fall. She thinks legs have been a little weak since she has been admitted so many times. Pain in face is mild. Denies headache. In the ED, head CT done showing subdural hematoma. Dr WeEulis FosterEDP) spoke with neurolosurg who recommended overnight obs with no further intervention.   Patient recently admitted 3/19 and discharged 2 days later, due to hematochezia and abdominal pain with Crohns flare. Her symptoms are not bad and she has been eating well since discharge. She has had nonbloody normal BMs and no vomiting.   Review Of Systems: Per HPI with the following additions: None. Otherwise 12 point review of systems was performed and was unremarkable.  Patient Active Problem List   Diagnosis Date Noted  . GI bleeding 06/18/2013  . Hematochezia 06/18/2013  . Diastolic CHF 0350/27/7412. UTI (lower urinary tract infection) 06/12/2013  . Dehydration 06/12/2013  .  Chest pain 06/12/2013  . Crohn's disease 06/10/2013  . UTI (urinary tract infection) 05/14/2013  . External hemorrhoid 02/05/2012  . Rectal bleeding 02/05/2012  . Routine health maintenance 02/05/2012  . Obstruction of bile duct  06/02/2011  . Choledocholithiasis with obstruction 06/02/2011  . Nonspecific elevation of levels of transaminase or lactic acid dehydrogenase (LDH) 06/02/2011  . Hypokalemia 06/02/2011  . Vitamin d deficiency 05/29/2011  . Elevated BP 05/29/2011  . Diverticulitis 06/19/2010  . ANEMIA 11/27/2007  . ESOPHAGEAL STRICTURE 11/27/2007  . GERD 11/27/2007  . DUODENITIS 11/27/2007   Past Medical History: Past Medical History  Diagnosis Date  . Colitis   . GERD (gastroesophageal reflux disease)   . Anemia   . Adrenal nodule   . Diverticulosis   . Hx of gastritis   . History of esophageal stricture   . Choledocholithiasis with obstruction   . Diabetes mellitus     pt denies  . Hiatal hernia   . Blood transfusion without reported diagnosis   . Cataract     bil removed  . Crohn's colitis 07/11/12  . Fatty liver    Past Surgical History: Past Surgical History  Procedure Laterality Date  . Cholecystectomy  2009  . Tubal ligation    . Cataract extraction    . Ercp  06/02/2011    Procedure: ENDOSCOPIC RETROGRADE CHOLANGIOPANCREATOGRAPHY (ERCP);  Surgeon: Lafayette Dragon, MD;  Location: [facility];  Service: Endoscopy;  Laterality: N/A;  dottie /ja  . Ercp  06/02/2011    Procedure: ENDOSCOPIC RETROGRADE CHOLANGIOPANCREATOGRAPHY (ERCP);  Surgeon: Gatha Mayer, MD;  Location: [facility];  Service: Endoscopy;  Laterality: N/A;  . Colonoscopy    . Upper gastrointestinal endoscopy    . Cardiac catheterization  05/2013   Social History: History  Substance Use Topics  . Smoking status: Never Smoker   . Smokeless tobacco: Never Used  . Alcohol Use: No   Additional social history: None Please also refer to relevant sections of EMR.  Family History: Family History  Problem Relation Age of Onset  . Bone cancer Maternal Grandmother   . Colon cancer Maternal Aunt 53  . Stomach cancer Sister 61    decsd  . Breast cancer Sister   . Esophageal cancer Neg Hx   . Rectal cancer Neg Hx     Allergies and Medications: No Known Allergies No current facility-administered medications on file prior to encounter.   Current Outpatient Prescriptions on File Prior to Encounter  Medication Sig Dispense Refill  . FeFum-FePo-FA-B Cmp-C-Zn-Mn-Cu (TANDEM PLUS) 162-115.2-1 MG CAPS Take 1 tablet by mouth 3 (three) times daily.  563 each  0  . folic acid (FOLVITE) 1 MG tablet Take 1 mg by mouth daily.      Marland Kitchen omeprazole (PRILOSEC) 20 MG capsule Take 20 mg by mouth daily.        Objective: BP 134/76  Pulse 67  Temp(Src) 98 F (36.7 C) (Oral)  Resp 16  Wt 142 lb 1 oz (64.439 kg)  SpO2 99% Exam: General: NAD, pleasant HEENT: 3 stitches across nasal bridge, hemostatic, dry blood, left forehead and cheekbone with abrasions that are hemostatic, minimal swelling, mild erythema, no asymmetry. O/p clear, sclera clear, EOMI, PERRL, no sinus tenderness Cardiovascular: RRR, no m/r/g Respiratory: CTAB, normal effort Abdomen: Soft, nontender, nondistended Extremities: trace LE edema bilaterally, no erythema Skin: No rash; abrasions per above, mild bruising 2-3 cm bilateral wrists Neuro: Awake, alert, no focal deficit, CN 2-12 tested and intact, normal speech, 5/5 LE and  UE strength bilaterally, normal finger-nose-finger and UE rapid alternating movements bilaterally  Labs and Imaging: CBC BMET   Recent Labs Lab 06/20/13 0316  WBC 8.3  HGB 9.5*  HCT 27.7*  PLT 255   No results found for this basename: NA, K, CL, CO2, BUN, CREATININE, GLUCOSE, CALCIUM,  in the last 168 hours   CT head without contrast: IMPRESSION:  3 mm left parafalcine hyperdensity concerning for small subdural  hematoma without mass effect. Recommend follow-up CT of the head in  6 hr to verify stability of findings.  Minimally depressed left nasal bone fracture may be acute.  Critical Value/emergent results were called by telephone at the time  of interpretation on 06/26/2013 at 4:19 PM to Dr. Daleen Bo , who   verbally acknowledged these results.    Hilton Sinclair, MD 06/26/2013, 5:44 PM PGY-2, [facility] pager: [phone], text pages welcome

## 2013-06-27 ENCOUNTER — Other Ambulatory Visit: Payer: Self-pay

## 2013-06-27 DIAGNOSIS — I6200 Nontraumatic subdural hemorrhage, unspecified: Secondary | ICD-10-CM | POA: Diagnosis not present

## 2013-06-27 DIAGNOSIS — S022XXA Fracture of nasal bones, initial encounter for closed fracture: Secondary | ICD-10-CM | POA: Diagnosis not present

## 2013-06-27 DIAGNOSIS — D649 Anemia, unspecified: Secondary | ICD-10-CM | POA: Diagnosis not present

## 2013-06-27 DIAGNOSIS — K5090 Crohn's disease, unspecified, without complications: Secondary | ICD-10-CM | POA: Diagnosis not present

## 2013-06-27 DIAGNOSIS — S065X0A Traumatic subdural hemorrhage without loss of consciousness, initial encounter: Secondary | ICD-10-CM | POA: Diagnosis not present

## 2013-06-27 DIAGNOSIS — IMO0002 Reserved for concepts with insufficient information to code with codable children: Secondary | ICD-10-CM | POA: Diagnosis not present

## 2013-06-27 DIAGNOSIS — S0181XA Laceration without foreign body of other part of head, initial encounter: Secondary | ICD-10-CM

## 2013-06-27 LAB — CBC
HCT: 30.3 % — ABNORMAL LOW (ref 36.0–46.0)
Hemoglobin: 10 g/dL — ABNORMAL LOW (ref 12.0–15.0)
MCH: 27 pg (ref 26.0–34.0)
MCHC: 33 g/dL (ref 30.0–36.0)
MCV: 81.9 fL (ref 78.0–100.0)
Platelets: 217 10*3/uL (ref 150–400)
RBC: 3.7 MIL/uL — ABNORMAL LOW (ref 3.87–5.11)
RDW: 16.3 % — ABNORMAL HIGH (ref 11.5–15.5)
WBC: 7.7 10*3/uL (ref 4.0–10.5)

## 2013-06-27 NOTE — Evaluation (Signed)
Physical Therapy Evaluation Patient Details Name: Diane Freeman MRN: [id number] DOB: [birth year] Today's Date: 06/27/2013   History of Present Illness  Patient is a 68 yo female admitted following a fall with SDH and nasal bridge fx.  Patient with 3 recent hospitalizations.  Now with general weakness.  Clinical Impression  Patient presents with problems listed below.  Will benefit from acute PT to maximize independence prior to discharge home with friend.  Patient with decreased balance - recommend OP PT for balance training. (Patient will need prescription for this. She has number to call for appointment.) Is safe to discharge home with 24 hour supervision.    Follow Up Recommendations Outpatient PT;Supervision/Assistance - 24 hour    Equipment Recommendations  None recommended by PT    Recommendations for Other Services       Precautions / Restrictions Precautions Precautions: Fall Restrictions Weight Bearing Restrictions: No      Mobility  Bed Mobility Overal bed mobility: Independent                Transfers Overall transfer level: Independent Equipment used: None             General transfer comment: Good standing balance  Ambulation/Gait Ambulation/Gait assistance: Supervision Ambulation Distance (Feet): 250 Feet Assistive device: None Gait Pattern/deviations: Step-through pattern;Decreased step length - right;Decreased step length - left;Staggering left;Staggering right Gait velocity: Slow Gait velocity interpretation: Below normal speed for age/gender General Gait Details: Patient with slight unsteadiness with gait.  Staggering to both sides x 3 during gait.  Was able to self-correct.  Stairs            Wheelchair Mobility    Modified Rankin (Stroke Patients Only)       Balance Overall balance assessment: Needs assistance                           High level balance activites: Turns;Head turns High Level Balance  Comments: Decreased balance with high level balance activities     Pertinent Vitals/Pain     Home Living Family/patient expects to be discharged to:: Private residence Living Arrangements: Alone Available Help at Discharge: Friend(s);Available 24 hours/day Type of Home: Mobile home Home Access: Stairs to enter Entrance Stairs-Rails: Psychiatric nurse of Steps: 3 Home Layout: One level Home Equipment: Environmental consultant - 2 wheels      Prior Function Level of Independence: Independent               Hand Dominance        Extremity/Trunk Assessment   Upper Extremity Assessment: Overall WFL for tasks assessed           Lower Extremity Assessment: Generalized weakness      Cervical / Trunk Assessment: Normal  Communication   Communication: No difficulties  Cognition Arousal/Alertness: Awake/alert Behavior During Therapy: WFL for tasks assessed/performed Overall Cognitive Status: Within Functional Limits for tasks assessed                      General Comments General comments (skin integrity, edema, etc.): Abrasions on bridge of nose, Lt cheek, and forehead.    Exercises        Assessment/Plan    PT Assessment Patient needs continued PT services  PT Diagnosis Abnormality of gait;Generalized weakness   PT Problem List Decreased strength;Decreased activity tolerance;Decreased balance;Decreased mobility;Pain  PT Treatment Interventions Gait training;Stair training;Functional mobility training;Therapeutic exercise;Balance training;Patient/family education   PT Goals (Current goals  can be found in the Care Plan section) Acute Rehab PT Goals Patient Stated Goal: to go home soon PT Goal Formulation: With patient Time For Goal Achievement: 07/04/13 Potential to Achieve Goals: Good    Frequency Min 4X/week   Barriers to discharge        End of Session Equipment Utilized During Treatment: Gait belt Activity Tolerance: Patient tolerated  treatment well Patient left: in bed;with call bell/phone within reach;with bed alarm set    Functional Assessment Tool Used: Clinical judgement Functional Limitation: Mobility: Walking and moving around Mobility: Walking and Moving Around Current Status (F4239): At least 1 percent but less than 20 percent impaired, limited or restricted Mobility: Walking and Moving Around Goal Status [phone]): At least 1 percent but less than 20 percent impaired, limited or restricted Mobility: Walking and Moving Around Discharge Status [phone]): At least 1 percent but less than 20 percent impaired, limited or restricted    Time: 1009-1028 PT Time Calculation (min): 19 min   Charges:   PT Evaluation $Initial PT Evaluation Tier I: 1 Procedure PT Treatments $Gait Training: 8-22 mins   PT G Codes:   Functional Assessment Tool Used: Clinical judgement Functional Limitation: Mobility: Walking and moving around    Despina Pole 06/27/2013, 10:44 AM Carita Pian. Sanjuana Kava, [place] Pager [phone]

## 2013-06-27 NOTE — Discharge Summary (Signed)
[facility] Discharge Summary  Patient name: Diane Freeman Medical record number: [id number] Date of birth: 10-07-1945 Age: 68 y.o. Gender: female Date of Admission: 06/26/2013  Date of Discharge: 06/27/2013  Admitting Physician: Zigmund Gottron, MD  Primary Care Provider: [name], MD Consultants: Neurosurgery called by ED and did not feel this was neurosurgical issue  Indication for Hospitalization: Subdural hematoma  Discharge Diagnoses/Problem List:  Subdural hematoma, stable on d/c Nasal bridge fracture Crohn's disease Deconditioning with recent hospitalizations. HLD GERD Anemia  Disposition: Home.  Discharge Condition: Stable  Discharge Exam:  BP 129/81  Pulse 83  Temp(Src) 98.2 F (36.8 C) (Oral)  Resp 20  Ht 5' 2"  (1.575 m)  Wt 138 lb 4 oz (62.71 kg)  BMI 25.28 kg/m2  SpO2 96% GEN: NAD, pleasant CV: RRR, no m/r/g PULM: CTAB, normal effort HEENT: stitches across nasal bridge, hemostatic, left forehead and cheekbone with abrasions that are hemostatic, minimal swelling, mild erythema, no asymmetry. O/p clear, sclera clear, EOMI, PERRL, no sinus tenderness NEURO: Awake, alert, nonfocal exam, normal speech  Brief Hospital Course:  Diane Freeman is a 68 y.o. female who was admitted after fall with head trauma and CT head findings of small subdural hematoma. She attributes fall to tripping and no sudden weakness, uncoordination, or dizziness/syncope. Head CT was repeated 6-7 hours later with no change in hematoma, and patient had no neurologic changes or telemetry findings during admission. She had mild nasal bone pain from a fracture (pt feels nose alignment is normal) and lac that was stitched. She was informed not to take any meds that could further bleeding (NSAIDs). On discharge she was told to make appt for f/u and head CT in 3-4 days. Return precautions reviewed as well for neurologic changes that should prompt  immediate eval.  Issues for Follow Up:  - Repeat head CT without contrast ordered for 06/30/13 and pt notified to get this done. - Pt to f/u with [facility] around 3/31 or 4/1 for hospital follow up. - Nasal bone lac stitches to come out ~1 week.  - F/u nasal bone healing.   Significant Procedures: None.  Significant Labs and Imaging:   Recent Labs Lab 06/27/13 0354  WBC 7.7  HGB 10.0*  HCT 30.3*  PLT 217   16:11 - CT head without contrast: IMPRESSION:  3 mm left parafalcine hyperdensity concerning for small subdural  hematoma without mass effect. Recommend follow-up CT of the head in  6 hr to verify stability of findings.  Minimally depressed left nasal bone fracture may be acute.  Critical Value/emergent results were called by telephone at the time  of interpretation on 06/26/2013 at 4:19 PM to Dr. Daleen Bo , who  verbally acknowledged these results.  22:25: CT head without contrast:IMPRESSION:  Stable interhemispheric/left parafalcine subdural hematoma measuring  up to 3 mm in thickness.   Results/Tests Pending at Time of Discharge: None  Discharge Medications:    Medication List         DELZICOL 400 MG Cpdr DR capsule  Generic drug:  Mesalamine  Take 1,200 mg by mouth 3 (three) times daily.     folic acid 1 MG tablet  Commonly known as:  FOLVITE  Take 1 mg by mouth daily.     omeprazole 20 MG capsule  Commonly known as:  PRILOSEC  Take 20 mg by mouth daily.     predniSONE 20 MG tablet  Commonly known as:  DELTASONE  Take 20 mg by  mouth 3 (three) times daily with meals.     TANDEM PLUS 162-115.2-1 MG Caps  Take 1 tablet by mouth 3 (three) times daily.        Discharge Instructions: Please refer to Patient Instructions section of EMR for full details.  Patient was counseled important signs and symptoms that should prompt return to medical care, changes in medications, dietary instructions, activity restrictions, and follow up appointments.   Follow-Up  Appointments: - Pt instructed to call and make f/u appointment Mon or Tuesday (3/31) in [facility] clinic. - Pt instructed to obtain head CT 3/30 or 3/31 for f/u of hematoma.  Hilton Sinclair, MD 06/27/2013, 10:46 AM PGY-2, [place]

## 2013-06-27 NOTE — Evaluation (Signed)
Occupational Therapy Evaluation and Discharge Patient Details Name: Diane Freeman MRN: [id number] DOB: Mar 02, 1946 Today's Date: 06/27/2013    History of Present Illness Diane Freeman is a 68 y.o. female presenting after a fall. PMH is significant for crohn disease with recent exacerbation, anemia, incidentally found lung nodule, HLD, DM, and GERD. Found to have a SDH and nasal bridge fracture   Clinical Impression   This pt present to acute OT lowest level of S due to recent fall and recent hospitalizations. No further OT needs with only recommendation told to the patient being to use her shower seat for now for safety due to her weakness.     Follow Up Recommendations  No OT follow up    Equipment Recommendations  None recommended by OT       Precautions / Restrictions Precautions Precautions: Fall Restrictions Weight Bearing Restrictions: No      Mobility Bed Mobility Overal bed mobility: Independent                Transfers Overall transfer level: Independent                         ADL                     General ADL Comments: overall at a S level due to h/o fall now and generalized weakness due to be in hospital several times as of late                     Hand Dominance Right   Extremity/Trunk Assessment Upper Extremity Assessment Upper Extremity Assessment: Overall WFL for tasks assessed           Communication Communication Communication: No difficulties   Cognition Arousal/Alertness: Awake/alert Behavior During Therapy: WFL for tasks assessed/performed Overall Cognitive Status: Within Functional Limits for tasks assessed                             Home Living Family/patient expects to be discharged to:: Private residence Living Arrangements: Alone Available Help at Discharge: Friend(s);Available 24 hours/day Type of Home: Mobile home Home Access: Stairs to enter Entrance Stairs-Number of  Steps: 3 Entrance Stairs-Rails: Right;Left Home Layout: One level     Bathroom Shower/Tub: Tub/shower unit;Walk-in shower Shower/tub characteristics: Architectural technologist: Standard     Home Equipment: Environmental consultant - 2 wheels;Shower seat          Prior Functioning/Environment Level of Independence: Independent                      OT Goals(Current goals can be found in the care plan section) Acute Rehab OT Goals Patient Stated Goal: Home today  OT Frequency:             End of Session:    Activity Tolerance: Patient tolerated treatment well Patient left: in bed   Time: 5784-6962 OT Time Calculation (min): 8 min Charges:  OT General Charges $OT Visit: 1 Procedure OT Evaluation $Initial OT Evaluation Tier I: 1 Procedure G-Codes: OT G-codes **NOT FOR INPATIENT CLASS** Functional Assessment Tool Used: Clinical observation Functional Limitation: Self care Self Care Current Status (X5284): At least 1 percent but less than 20 percent impaired, limited or restricted Self Care Goal Status (X3244): At least 1 percent but less than 20 percent impaired, limited or restricted Self Care Discharge Status [phone]): At least  1 percent but less than 20 percent impaired, limited or restricted  Almon Register [phone] 06/27/2013, 5:42 PM

## 2013-06-27 NOTE — Discharge Summary (Signed)
Seen and examined.  Discussed with Dr. Darene Lamer.  See my co sign of H&PE.  OK to discharge today.

## 2013-06-27 NOTE — Progress Notes (Addendum)
Patient d/ced this afternoon, d/c instructions given and patient educated to follow up to have her CT scan. Assessments remain unchanged prior to d/c. PT recommended outpatient PT and has been communicated to her.

## 2013-06-27 NOTE — H&P (Signed)
Seen and examined.  Discussed with Dr. Darene Lamer.  Agree with documentation and management.  Briefly, 68 yo female who tripped and fell yesterday striking head.  Suffered nasal fracture and found to have small subdural.  Repeat CT shows no expansion of subdural.  She feels great, only small amount of nasal pain.  No headache.  No LOC when fell.  No confusion or sleepiness. Issues: Fall - not medically related, simple trip. Nasal fracture, no septal hematoma no deviation.  Observe Small subdural hematoma.  Observe.  Repeat CT in 3 days to insure no slow bleed.  No ASA. OK to DC today.

## 2013-06-27 NOTE — Discharge Instructions (Signed)
You were admitted after a fall hitting your head. You had a small subdural hematoma that did not grow. You need a repeat head CT in 3-4 days, Monday or Tuesday. I will order this as a future study and you can set up with radiology to get this done.  Please call the family practice center and make an appointment for hospital follow up also Monday or Tuesday, preferrably after your CT scan. Avoid any medications that can increase risk of bleed, like ibuprofen, advil, naproxen, aleve, or aspirin, for now. If you develop any neurologic changes, difficulty with speech or walking, hearing or vision changes, sudden weakness, or other concerns, seek immediate care. You should have your stitches removed in ~1 week.  Best.  Hilton Sinclair, MD

## 2013-06-28 NOTE — Assessment & Plan Note (Signed)
Patient with fall outside of the office in which she fell on her face and suffered a laceration and other visible bruising and abrasions to her face. Given the location and shape of the laceration it was believed that there may not be good healing if repaired in the office. I discussed this with a plastic surgery PA who advised that given the location of the injury and the potential for fracture of her nose or face, she should be sent to the ED for evaluation of this injury and repair of her laceration. Pressure was held to the area until bleeding stopped and the patient was sent to the ED. Will need to reschedule her hospital f/u.

## 2013-06-28 NOTE — ED Provider Notes (Signed)
Medical screening examination/treatment/procedure(s) were conducted as a shared visit with non-physician practitioner(s) and myself.  I personally evaluated the patient during the encounter.   EKG Interpretation None       Richarda Blade, MD 06/28/13 2115

## 2013-06-29 NOTE — Progress Notes (Signed)
UR COMPLETED  

## 2013-06-30 ENCOUNTER — Ambulatory Visit (HOSPITAL_COMMUNITY)
Admission: RE | Admit: 2013-06-30 | Discharge: 2013-06-30 | Disposition: A | Discharge status: Home / Self Care | Payer: Medicare Other | Source: Ambulatory Visit | Attending: Family Medicine | Admitting: Family Medicine

## 2013-06-30 DIAGNOSIS — I6200 Nontraumatic subdural hemorrhage, unspecified: Secondary | ICD-10-CM | POA: Diagnosis not present

## 2013-06-30 DIAGNOSIS — S065XAA Traumatic subdural hemorrhage with loss of consciousness status unknown, initial encounter: Secondary | ICD-10-CM

## 2013-06-30 DIAGNOSIS — S065X9A Traumatic subdural hemorrhage with loss of consciousness of unspecified duration, initial encounter: Secondary | ICD-10-CM

## 2013-06-30 DIAGNOSIS — S066XAA Traumatic subarachnoid hemorrhage with loss of consciousness status unknown, initial encounter: Secondary | ICD-10-CM | POA: Diagnosis not present

## 2013-06-30 DIAGNOSIS — S066X9A Traumatic subarachnoid hemorrhage with loss of consciousness of unspecified duration, initial encounter: Secondary | ICD-10-CM | POA: Diagnosis not present

## 2013-07-01 ENCOUNTER — Ambulatory Visit (HOSPITAL_COMMUNITY): Payer: Medicare Other

## 2013-07-21 ENCOUNTER — Other Ambulatory Visit: Payer: Self-pay | Admitting: Internal Medicine

## 2013-08-07 ENCOUNTER — Other Ambulatory Visit (INDEPENDENT_AMBULATORY_CARE_PROVIDER_SITE_OTHER): Payer: Medicare Other

## 2013-08-07 ENCOUNTER — Encounter: Payer: Self-pay | Admitting: Internal Medicine

## 2013-08-07 ENCOUNTER — Other Ambulatory Visit: Payer: Medicare Other

## 2013-08-07 ENCOUNTER — Ambulatory Visit (INDEPENDENT_AMBULATORY_CARE_PROVIDER_SITE_OTHER): Payer: Medicare Other | Admitting: Internal Medicine

## 2013-08-07 VITALS — BP 172/90 | HR 84 | Ht 62.0 in | Wt 133.2 lb

## 2013-08-07 DIAGNOSIS — K5150 Left sided colitis without complications: Secondary | ICD-10-CM

## 2013-08-07 DIAGNOSIS — K5090 Crohn's disease, unspecified, without complications: Secondary | ICD-10-CM | POA: Diagnosis not present

## 2013-08-07 DIAGNOSIS — K5289 Other specified noninfective gastroenteritis and colitis: Secondary | ICD-10-CM

## 2013-08-07 DIAGNOSIS — D509 Iron deficiency anemia, unspecified: Secondary | ICD-10-CM | POA: Diagnosis not present

## 2013-08-07 DIAGNOSIS — Z79899 Other long term (current) drug therapy: Secondary | ICD-10-CM | POA: Diagnosis not present

## 2013-08-07 LAB — CBC WITH DIFFERENTIAL/PLATELET
BASOS ABS: 0 10*3/uL (ref 0.0–0.1)
Basophils Relative: 0.3 % (ref 0.0–3.0)
Eosinophils Absolute: 0 10*3/uL (ref 0.0–0.7)
Eosinophils Relative: 0 % (ref 0.0–5.0)
HEMATOCRIT: 35.6 % — AB (ref 36.0–46.0)
HEMOGLOBIN: 12.3 g/dL (ref 12.0–15.0)
LYMPHS PCT: 11.7 % — AB (ref 12.0–46.0)
Lymphs Abs: 1.6 10*3/uL (ref 0.7–4.0)
MCHC: 34.6 g/dL (ref 30.0–36.0)
MCV: 84.6 fl (ref 78.0–100.0)
Monocytes Absolute: 0.4 10*3/uL (ref 0.1–1.0)
Monocytes Relative: 3.1 % (ref 3.0–12.0)
Neutro Abs: 11.4 10*3/uL — ABNORMAL HIGH (ref 1.4–7.7)
Neutrophils Relative %: 84.9 % — ABNORMAL HIGH (ref 43.0–77.0)
PLATELETS: 307 10*3/uL (ref 150.0–400.0)
RBC: 4.21 Mil/uL (ref 3.87–5.11)
RDW: 17.7 % — AB (ref 11.5–15.5)
WBC: 13.4 10*3/uL — AB (ref 4.0–10.5)

## 2013-08-07 LAB — SEDIMENTATION RATE: SED RATE: 22 mm/h (ref 0–22)

## 2013-08-07 MED ORDER — PREDNISONE 10 MG PO TABS: ORAL_TABLET | ORAL | Status: DC

## 2013-08-07 NOTE — Progress Notes (Signed)
Patient ID: Diane Freeman, female   DOB: [birth year], 68 y.o.   MRN: [id number]  Diane Freeman March 15, 1946 [id number]  Note: This dictation was prepared with Freeman digital system. Any transcriptional errors that result from this procedure are unintentional.   History of Present Illness:  This is a 68 year old white female with inflammatory bowel disease since 2007 diagnosed as Crohn's colitis. She was recently hospitalized from 3-19/15-06/20/2013 for , rectal bleeding and diarrhea. Her hemoglobin dropped to 9.9. A CT scan of the abdomen showed left-sided colitis. Her last colonoscopy in April 2011 confirmed left-sided colitis. She responded to prednisone taper. This time she has been tapering  prednisone from an initial 40 mg a day to a current 10 mg a day. Her diarrhea has completely resolved. She has not seen any blood in her stool. She denies abdominal pain. Her level of energy has been adequate. She is also on mesalamine 4.8 g daily. This is not very affordable for her, she is using samples. There is a history of choledocholithiasis in 2013, status post endoscopic sphincterotomy and stone removal.    Past Medical History  Diagnosis Date  . Colitis   . GERD (gastroesophageal reflux disease)   . Anemia   . Adrenal nodule   . Diverticulosis   . Hx of gastritis   . History of esophageal stricture   . Choledocholithiasis with obstruction   . Diabetes mellitus     pt denies  . Hiatal hernia   . Blood transfusion without reported diagnosis   . Cataract     bil removed  . Crohn's colitis 07/11/12  . Fatty liver     Past Surgical History  Procedure Laterality Date  . Cholecystectomy  2009  . Tubal ligation    . Cataract extraction    . Ercp  06/02/2011    Procedure: ENDOSCOPIC RETROGRADE CHOLANGIOPANCREATOGRAPHY (ERCP);  Surgeon: Diane Dragon, MD;  Location: [facility];  Service: Endoscopy;  Laterality: N/A;  dottie /ja  . Ercp  06/02/2011    Procedure: ENDOSCOPIC RETROGRADE  CHOLANGIOPANCREATOGRAPHY (ERCP);  Surgeon: Diane Mayer, MD;  Location: [facility];  Service: Endoscopy;  Laterality: N/A;  . Colonoscopy    . Upper gastrointestinal endoscopy    . Cardiac catheterization  05/2013    No Known Allergies  Family history and social history have been reviewed.  Review of Systems: Ankle swelling. Weight loss.  The remainder of the 10 point ROS is negative except as outlined in the H&P  Physical Exam: General Appearance Well developed, in no distress appears cushingoid Eyes  Non icteric  HEENT  Non traumatic, normocephalic that is also aberrations and concussion of the face from a recent fall with remaining bruising Mouth No lesion, tongue papillated, no cheilosis Neck Supple without adenopathy, thyroid not enlarged, no carotid bruits, no JVD Lungs Clear to auscultation bilaterally COR Normal S1, normal S2, regular rhythm, no murmur, quiet precordium Abdomen soft nontender abdomen with normoactive bowel sounds Rectal not done Extremities  1+ pedal edema Skin No lesions except for multiple ecchymoses on the face and arms as well as legs Neurological Alert and oriented x 3 Psychological Normal mood and affect  Assessment and Plan:   Problem #1 Crohn's colitis initially diagnosed in 2007. She had a recent admission to the hospital for an exacerbation. She has responded to a prednisone taper. She will continue  prednisone taper for another 3 weeks and stay on mesalamine 4.8 g daily. She will call us when she runs out of  the samples and we will put her on sulfasalazine and folic acid. Today, we will check  CBC and sedimentation rate. She will return in 6 months.    Diane Freeman 08/07/2013

## 2013-08-07 NOTE — Patient Instructions (Signed)
Dr Olevia Perches has advised that you be on a prednisone taper. The taper instructions are as follows: 10 mg (1 tablet) x 1 week 5 mg (0.5 tablet) x 2 weeks  Your physician has requested that you go to the basement for the following lab work before leaving today: CBC, Sed Rate  Please follow up with Dr Olevia Perches in 6 months.  CC:Dr [name]

## 2013-08-10 ENCOUNTER — Encounter: Payer: Self-pay | Admitting: Family Medicine

## 2013-08-10 ENCOUNTER — Ambulatory Visit (INDEPENDENT_AMBULATORY_CARE_PROVIDER_SITE_OTHER): Payer: Medicare Other | Admitting: Family Medicine

## 2013-08-10 VITALS — BP 144/88 | HR 79 | Temp 98.3°F | Wt 133.0 lb

## 2013-08-10 DIAGNOSIS — M6281 Muscle weakness (generalized): Secondary | ICD-10-CM | POA: Diagnosis not present

## 2013-08-10 DIAGNOSIS — S022XXA Fracture of nasal bones, initial encounter for closed fracture: Secondary | ICD-10-CM | POA: Diagnosis not present

## 2013-08-10 NOTE — Assessment & Plan Note (Addendum)
Patient with newly reported bilateral hip flexor weakness. Patient is without additional neurological findings and no noted back findings. As this is bilateral, this likely represents muscle deconditioning of her hip flexors. Discussed straight leg extensions to be done in laying position to attempt to strengthen her hip flexors. If this does not prove beneficial, will need to consider referral to PT. F/u in one month.

## 2013-08-10 NOTE — Assessment & Plan Note (Signed)
Presents in f/u for this issue. Appears to be healing well. No issue breathing through her nose. Swelling is much improved. Minimal pain that is getting better with time. Will continue to follow this issue. Given return precautions.

## 2013-08-10 NOTE — Progress Notes (Signed)
Patient ID: Diane Freeman, female   DOB: 12/19/1945, 68 y.o.   MRN: [id number]  [name], MD Phone: [phone]  Diane Freeman is a 68 y.o. female who presents today for f/u.  Nasal fracture: patient seen in f/u from nasal laceration and fracture. This happened 06/26/13. She was sent to the ED for laceration repair and CT head. CT head noted small subdural hematoma. She had repeat CT head 06/30/13 that showed improved subdural hematoma. She was to f/u for suture removal but reports they came out on their own so did not f/u. She reports improving tenderness to the site. Today it is a 5/10 if the area is pressed on. The swelling has improved.  Left leg weakness: patient notes this occurs when she starts up the steps. She notes she occasionally has to help the leg to get started, but once she gets started she is ok. She denies numbness and tingling. She denies back pain, bowel and urinary issues, and saddle anesthesia. She notes she walks fine. She notes no other deficits. She states she figures she needs to walk more.  Patient is a nonsmoker.   ROS: Per HPI   Physical Exam Filed Vitals:   08/10/13 0906  BP: 144/88  Pulse: 79  Temp: 98.3 F (36.8 C)    Gen: Well NAD HEENT: EOMI,  MMM, nasal bridge with small elevation in area of previous fracture, minimal tenderness to palpation over this site, no swelling noted, no bruising noted, well healing scar noted on bridge of nose Lungs: CTABL Nl WOB Heart: RRR no MRG Neuro: CN 2-12 intact, 4/5 strength in bilateral hip flexors, 5/5 strength in bilateral quads, hamstrings, plantar and dorsiflexion, sensation to light touch intact bilaterally LE, 2+ patellar and achilles reflexes, normal gait MSK: no back tenderness to palpation in midline or other area Exts: Non edematous BL  LE, warm and well perfused.    Assessment/Plan: Please see individual problem list.

## 2013-08-10 NOTE — Patient Instructions (Signed)
Nice to see you. I'm glad your nose is healing well. Please continue to monitor the healing of your nose. If you develop any changes in vision, pain, or function of your face please let us know. For your hip weakness, please do straight leg raises. You can do these while laying down. Then lift your whole leg while keeping it straight.

## 2013-09-14 ENCOUNTER — Ambulatory Visit (INDEPENDENT_AMBULATORY_CARE_PROVIDER_SITE_OTHER): Payer: Medicare Other | Admitting: Family Medicine

## 2013-09-14 ENCOUNTER — Other Ambulatory Visit: Payer: Self-pay | Admitting: Internal Medicine

## 2013-09-14 ENCOUNTER — Encounter: Payer: Self-pay | Admitting: Family Medicine

## 2013-09-14 VITALS — BP 125/78 | HR 96 | Temp 98.2°F | Resp 18 | Wt 135.0 lb

## 2013-09-14 DIAGNOSIS — S022XXA Fracture of nasal bones, initial encounter for closed fracture: Secondary | ICD-10-CM | POA: Diagnosis not present

## 2013-09-14 DIAGNOSIS — K219 Gastro-esophageal reflux disease without esophagitis: Secondary | ICD-10-CM | POA: Diagnosis not present

## 2013-09-14 DIAGNOSIS — M6281 Muscle weakness (generalized): Secondary | ICD-10-CM | POA: Diagnosis not present

## 2013-09-14 NOTE — Assessment & Plan Note (Signed)
Improved. Now without pain and with no deficits. No further f/u for this issue.

## 2013-09-14 NOTE — Assessment & Plan Note (Signed)
Patient is stable on prilosec. No reported symptoms at this time. Will continue prilosec at this time. Patient reported no need for refills. F/u in one year for this.

## 2013-09-14 NOTE — Progress Notes (Signed)
Patient ID: Diane Freeman, female   DOB: [birth year], 68 y.o.   MRN: [id number]  Diane Rumps, MD Phone: [phone]  [name] is a 68 y.o. female who presents today for f/u.  Nasal fracture: patient seen in f/u from nasal laceration and fracture. This happened 06/26/13. She was sent to the ED for laceration repair and CT head. CT head noted small subdural hematoma. She had repeat CT head 06/30/13 that showed improved subdural hematoma. Today she has no pain. She is breathing well. Her vision is normal. States the cut has healed.  Bilateral hip flexor weakness: she notes this has greatly improved with the exercises provided at her last visit. She is stronger and able to get up stairs with no difficulty. She denies any back pain or other weakness.  GERD: patient is on prilosec. States this works well. She avoids spicey foods as these are triggers. No abdominal pain or reflux at this time.    Patient is a nonsmoker.   ROS: Per HPI   Physical Exam Filed Vitals:   09/14/13 1135  BP: 125/78  Pulse: 96  Temp: 98.2 F (36.8 C)  Resp: 18    Gen: Well NAD HEENT: PERRL,  MMM Lungs: CTABL Nl WOB Heart: RRR no MRG Abd: soft, NT, ND Neuro: 4/5 strength in bilateral hip flexors, 5/5 strength in bilateral quads, hamstrings, plantar and dorsiflexion, sensation to light touch intact bilaterally, patellar reflexes absent bilaterally Exts: Non edematous BL  LE, warm and well perfused.    Assessment/Plan: Please see individual problem list.  # Healthcare maintenance: up to date.

## 2013-09-14 NOTE — Assessment & Plan Note (Signed)
Improved strength in bilateral hips, though not at a normal level. Patient with improved functional status. She is to continue her exercises. F/u prn for this issue.

## 2013-09-14 NOTE — Patient Instructions (Signed)
Nice to see you. Please keep doing the exercises I provided at your last visit. These will continue to strengthen your hips.

## 2013-12-02 ENCOUNTER — Encounter: Payer: Self-pay | Admitting: Internal Medicine

## 2014-01-11 ENCOUNTER — Other Ambulatory Visit: Payer: Self-pay | Admitting: Internal Medicine

## 2014-02-09 ENCOUNTER — Other Ambulatory Visit: Payer: Self-pay | Admitting: Internal Medicine

## 2014-03-07 ENCOUNTER — Other Ambulatory Visit: Payer: Self-pay | Admitting: Internal Medicine

## 2014-03-11 ENCOUNTER — Encounter (HOSPITAL_COMMUNITY): Payer: Self-pay | Admitting: Cardiovascular Disease

## 2014-03-29 ENCOUNTER — Encounter: Payer: Self-pay | Admitting: Internal Medicine

## 2014-03-29 ENCOUNTER — Ambulatory Visit (INDEPENDENT_AMBULATORY_CARE_PROVIDER_SITE_OTHER): Payer: Medicare Other | Admitting: Internal Medicine

## 2014-03-29 ENCOUNTER — Other Ambulatory Visit (INDEPENDENT_AMBULATORY_CARE_PROVIDER_SITE_OTHER): Payer: Medicare Other

## 2014-03-29 VITALS — BP 122/76 | HR 80 | Ht 62.25 in | Wt 135.5 lb

## 2014-03-29 DIAGNOSIS — K5090 Crohn's disease, unspecified, without complications: Secondary | ICD-10-CM

## 2014-03-29 DIAGNOSIS — D509 Iron deficiency anemia, unspecified: Secondary | ICD-10-CM

## 2014-03-29 LAB — CBC WITH DIFFERENTIAL/PLATELET
BASOS ABS: 0.1 10*3/uL (ref 0.0–0.1)
Basophils Relative: 0.8 % (ref 0.0–3.0)
EOS ABS: 0.3 10*3/uL (ref 0.0–0.7)
Eosinophils Relative: 3.9 % (ref 0.0–5.0)
HCT: 37.9 % (ref 36.0–46.0)
Hemoglobin: 12.3 g/dL (ref 12.0–15.0)
LYMPHS ABS: 2.7 10*3/uL (ref 0.7–4.0)
Lymphocytes Relative: 35.6 % (ref 12.0–46.0)
MCHC: 32.6 g/dL (ref 30.0–36.0)
MCV: 78.1 fl (ref 78.0–100.0)
MONO ABS: 0.6 10*3/uL (ref 0.1–1.0)
MONOS PCT: 8.5 % (ref 3.0–12.0)
Neutro Abs: 3.8 10*3/uL (ref 1.4–7.7)
Neutrophils Relative %: 51.2 % (ref 43.0–77.0)
PLATELETS: 246 10*3/uL (ref 150.0–400.0)
RBC: 4.85 Mil/uL (ref 3.87–5.11)
RDW: 16.1 % — ABNORMAL HIGH (ref 11.5–15.5)
WBC: 7.5 10*3/uL (ref 4.0–10.5)

## 2014-03-29 LAB — SEDIMENTATION RATE: Sed Rate: 31 mm/hr — ABNORMAL HIGH (ref 0–22)

## 2014-03-29 LAB — IBC PANEL
Iron: 41 ug/dL — ABNORMAL LOW (ref 42–145)
Saturation Ratios: 8.5 % — ABNORMAL LOW (ref 20.0–50.0)
Transferrin: 343.5 mg/dL (ref 212.0–360.0)

## 2014-03-29 LAB — VITAMIN B12: Vitamin B-12: 266 pg/mL (ref 211–911)

## 2014-03-29 MED ORDER — MESALAMINE 800 MG PO TBEC: 1.0000 | DELAYED_RELEASE_TABLET | Freq: Every day | ORAL | Status: DC

## 2014-03-29 NOTE — Progress Notes (Signed)
Diane Freeman [birth year] [id number]  Note: This dictation was prepared with Dragon digital system. Any transcriptional errors that result from this procedure are unintentional.   History of Present Illness:  This is a 68 year old white female with Crohn's colitis since 2007 and a hospitalization for rectal bleeding from 3/19 to 06/20/2013. CT scan confirmed  left-sided colitis. She responded to prednisone taper and mesalamine 4.8 g daily. She reports Mesalamine caused  hair loss . She ran out of Lialda about a month ago but remains asymptomatic.Marland Kitchen Patient denies diarrhea, abdominal pain, rectal bleeding. She has a history of choledocholithiasis necessitating ERCP sphincterotomy and removal of common bile duct stones in 2013.Marland Kitchen    Past Medical History  Diagnosis Date  . Colitis   . GERD (gastroesophageal reflux disease)   . Anemia   . Adrenal nodule   . Diverticulosis   . Hx of gastritis   . History of esophageal stricture   . Choledocholithiasis with obstruction   . Diabetes mellitus     pt denies  . Hiatal hernia   . Blood transfusion without reported diagnosis   . Cataract     bil removed  . Crohn's colitis 07/11/12  . Fatty liver     Past Surgical History  Procedure Laterality Date  . Cholecystectomy  2009  . Tubal ligation    . Cataract extraction    . Ercp  06/02/2011    Procedure: ENDOSCOPIC RETROGRADE CHOLANGIOPANCREATOGRAPHY (ERCP);  Surgeon: Lafayette Dragon, MD;  Location: [facility];  Service: Endoscopy;  Laterality: N/A;  dottie /ja  . Ercp  06/02/2011    Procedure: ENDOSCOPIC RETROGRADE CHOLANGIOPANCREATOGRAPHY (ERCP);  Surgeon: Gatha Mayer, MD;  Location: [facility];  Service: Endoscopy;  Laterality: N/A;  . Colonoscopy    . Upper gastrointestinal endoscopy    . Cardiac catheterization  05/2013  . Left and right heart catheterization with coronary angiogram N/A 06/15/2013    Procedure: LEFT AND RIGHT HEART CATHETERIZATION WITH CORONARY ANGIOGRAM- Shunt series;   Surgeon: Blane Ohara, MD;  Location: [facility];  Service: Cardiovascular;  Laterality: N/A;    No Known Allergies  Family history and social history have been reviewed.  Review of Systems: Negative for abdominal pain, rectal bleeding, weight loss  The remainder of the 10 point ROS is negative except as outlined in the H&P  Physical Exam: General Appearance Well developed, in no distress Eyes  Non icteric  HEENT  Non traumatic, normocephalic  Mouth No lesion, tongue papillated, no cheilosis Neck Supple without adenopathy, thyroid not enlarged, no carotid bruits, no JVD Lungs Clear to auscultation bilaterally COR Normal S1, normal S2, regular rhythm, no murmur, quiet precordium Abdomen soft nontender with normoactive bowel sounds. No distention. Liver edge at costal margin Rectal soft Hemoccult negative stool. External hemorrhoidal tags Extremities  No pedal edema Skin No lesions Neurological Alert and oriented x 3 Psychological Normal mood and affect  Assessment and Plan:   Problem #64 68 year old white female with Crohn's colitis in remission. We will resume mesalamine 800 mg daily as a maintenance dose. She is concerned about hair loss with higher doses. We will be checking her blood count, B12, and iron studies as well as sedimentation rate. I will see her in one year. She is up-to-date on her colonoscopy.  Problem #2 Choledocholithiasis. This is not an active problem.    Delfin Edis 03/29/2014

## 2014-03-29 NOTE — Patient Instructions (Signed)
We have sent the following medications to your pharmacy for you to pick up at your convenience: Asacol HD 800 mg-1 tablet daily  Your physician has requested that you go to the basement for the following lab work before leaving today: CBC, Sed Rate, Vitamin B12, IBC panel  Please follow up with Dr Olevia Perches in 1 year.  CC:Dr [employer]

## 2014-03-30 ENCOUNTER — Telehealth: Payer: Self-pay | Admitting: *Deleted

## 2014-03-30 MED ORDER — CYANOCOBALAMIN 1000 MCG/ML IJ SOLN: 1000.0000 ug | INTRAMUSCULAR | Status: DC

## 2014-03-30 NOTE — Telephone Encounter (Signed)
I have spoken to patient to advise that she should continue iron supplements due to low iron level. I also advised that she will need to begin b12 injections x 6 months due to low b12 level. She verbalizes understanding. She will pick up b12 at pharmacy (rx sent today) and will come for 1st injection/training on 04/08/14 @ 9:00 am.

## 2014-03-30 NOTE — Telephone Encounter (Signed)
-----   Message from Lafayette Dragon, MD sent at 03/29/2014  5:14 PM EST ----- Please call pt with very low Iron level. Continue Iron  Supplements. Also B12 is low. Please start B12 1000ug IM monthly x 6 then recheck.

## 2014-03-31 ENCOUNTER — Telehealth: Payer: Self-pay | Admitting: Internal Medicine

## 2014-03-31 ENCOUNTER — Telehealth: Payer: Self-pay | Admitting: *Deleted

## 2014-03-31 MED ORDER — MESALAMINE ER 0.375 G PO CP24: ORAL_CAPSULE | ORAL | Status: DC

## 2014-03-31 NOTE — Telephone Encounter (Signed)
Rx was changed to [employer]. No longer needs Asacol

## 2014-03-31 NOTE — Telephone Encounter (Signed)
Please send Apriso .375 mg, #180, 2 po tid, OR #540, 90 day supply., 3 refills.

## 2014-03-31 NOTE — Telephone Encounter (Signed)
Patient advised of new recommendations and verbalizes understanding. Apriso rx sent to pharmacy.

## 2014-03-31 NOTE — Telephone Encounter (Signed)
Dr Olevia Perches- Patient's insurance prefers Apriso rather than Asacol HD 800 mg tablets. Please advise.Marland KitchenMarland Kitchen

## 2014-04-05 ENCOUNTER — Other Ambulatory Visit: Payer: Self-pay | Admitting: *Deleted

## 2014-04-05 MED ORDER — OMEPRAZOLE 20 MG PO CPDR
20.0000 mg | DELAYED_RELEASE_CAPSULE | Freq: Every morning | ORAL | Status: DC
Start: 2014-04-05 — End: 2014-09-01

## 2014-04-08 ENCOUNTER — Ambulatory Visit (INDEPENDENT_AMBULATORY_CARE_PROVIDER_SITE_OTHER): Payer: Medicare Other | Admitting: Internal Medicine

## 2014-04-08 DIAGNOSIS — E538 Deficiency of other specified B group vitamins: Secondary | ICD-10-CM

## 2014-04-08 MED ORDER — CYANOCOBALAMIN 1000 MCG/ML IJ SOLN
1000.0000 ug | INTRAMUSCULAR | Status: AC
  Administered 2014-04-08: 1000 ug via INTRAMUSCULAR

## 2014-04-15 ENCOUNTER — Encounter (HOSPITAL_COMMUNITY): Payer: Self-pay | Admitting: Internal Medicine

## 2014-06-29 ENCOUNTER — Encounter: Payer: Self-pay | Admitting: Family Medicine

## 2014-06-29 ENCOUNTER — Ambulatory Visit (INDEPENDENT_AMBULATORY_CARE_PROVIDER_SITE_OTHER): Payer: Medicare Other | Admitting: Family Medicine

## 2014-06-29 VITALS — BP 158/88 | HR 80 | Temp 98.3°F | Ht 63.0 in | Wt 134.0 lb

## 2014-06-29 DIAGNOSIS — R002 Palpitations: Secondary | ICD-10-CM

## 2014-06-29 DIAGNOSIS — R519 Headache, unspecified: Secondary | ICD-10-CM

## 2014-06-29 DIAGNOSIS — IMO0001 Reserved for inherently not codable concepts without codable children: Secondary | ICD-10-CM

## 2014-06-29 DIAGNOSIS — R51 Headache: Secondary | ICD-10-CM

## 2014-06-29 DIAGNOSIS — R030 Elevated blood-pressure reading, without diagnosis of hypertension: Secondary | ICD-10-CM | POA: Diagnosis not present

## 2014-06-29 NOTE — Patient Instructions (Signed)
Nice to see you. We will need you to come back to have your blood pressure rechecked in one week. If it is still elevated we will need to start you on medication.  We will get a CT scan of your head to evaluate your headaches. If you develop weakness, numbness, change in vision, worsening headaches, or change in speech please seek medical attention.

## 2014-06-30 DIAGNOSIS — R51 Headache: Secondary | ICD-10-CM

## 2014-06-30 DIAGNOSIS — IMO0001 Reserved for inherently not codable concepts without codable children: Secondary | ICD-10-CM | POA: Insufficient documentation

## 2014-06-30 DIAGNOSIS — R002 Palpitations: Secondary | ICD-10-CM | POA: Insufficient documentation

## 2014-06-30 DIAGNOSIS — R030 Elevated blood-pressure reading, without diagnosis of hypertension: Secondary | ICD-10-CM

## 2014-06-30 DIAGNOSIS — R519 Headache, unspecified: Secondary | ICD-10-CM | POA: Insufficient documentation

## 2014-06-30 NOTE — Assessment & Plan Note (Signed)
Elevated today. Last 2 BPs normal in system. Will have patient return in one week for nurse check of BP. If elevated at that time will start on medication.

## 2014-06-30 NOTE — Assessment & Plan Note (Addendum)
Patient with onset of headaches following fall last year. Is neurologically intact at this time and has no fever to indicate infection. Had history of small subdural hematoma that was reduced in size at last check and at that time patient was neurologically intact. Headaches could potentially represent a trigeminal neuralgia though bilateral forehead distribution would be abnormal for this issue. Could also be primary stabbing headache given sharp description. Doubt expansion of prior hematoma given normal neurological exam. Given prior head injury and onset following this will obtain CT head to further evaluate for cause. Given return precautions.   Discussed this problem with Dr Nori Riis.

## 2014-06-30 NOTE — Progress Notes (Signed)
Patient ID: Diane Freeman, female   DOB: 01/01/1946, 69 y.o.   MRN: [id number]  [name], MD Phone: [phone]  Diane Freeman is a 69 y.o. female who presents today for f/u.  Headache: notes this is across her forehead. Is a sharp pain. Lasts for 1 minute at a time. This is the first report to our clinic of headache since the patient fell, though she reports these started one year ago after she had a fall in the parking lot of the [facility] and fractured her nose and had a small subdural hematoma. She reports no nasal pain at site of prior fracture. This headache occurs 1-2x/month. Has history of HA, though prior HAs more consistent with tension HA. No numbness, weakness, fever, or rhinorrhea.  Palpitations: notes has had all her life. Has a couple times a month. Lasting for less than 1 minute at a time. Come on out of no where. No CP or dyspnea with them. States she takes a deep breath and they go away. Had cath last year with minimal non-obstructive CAD. Echo with grade 1 diastolic dysfunction and mild-moderate tricuspid regurgitation last year as well.  PMH: nonsmoker.  Patient Information Form: Screening and ROS  Do you feel safe in relationships? Yes.   PHQ-2:negative  Review of Symptoms, per HPI, otherwise reviewed below  General:  Negative for nexplained weight loss, fever Skin: Negative for new or changing mole, sore that won't heal HEENT: Negative for trouble hearing, trouble seeing, ringing in ears, mouth sores, hoarseness, change in voice, dysphagia. CV:  Negative for chest pain, dyspnea, edema Resp: Negative for cough, dyspnea, hemoptysis GI: Negative for nausea, vomiting, diarrhea, constipation, abdominal pain, melena, hematochezia. GU: Negative for dysuria, incontinence, urinary hesitance, hematuria, vaginal or penile discharge, polyuria, sexual difficulty, lumps in testicle or breasts MSK: Negative for muscle cramps or aches, joint pain or swelling Neuro: Negative  for weakness, numbness, dizziness, passing out/fainting Psych: Negative for depression, anxiety, memory problems    Physical Exam Filed Vitals:   06/29/14 1629  BP: 158/88  Pulse:   Temp:     Gen: Well NAD HEENT: PERRL,  MMM, scar on bridge of nose Lungs: CTABL Nl WOB Heart: RRR  Neuro: Vision grossly intact, CN 3-12 intact, 5/5 strength in bilateral biceps, triceps, grip, quads, hamstrings, plantar and dorsiflexion, sensation to light touch intact in bilateral UE and LE, normal gait, 2+ patellar reflexes Exts: Non edematous BL  LE, warm and well perfused.    Assessment/Plan: Please see individual problem list.  [name], MD [place] PGY-3

## 2014-06-30 NOTE — Assessment & Plan Note (Addendum)
Patient with reported life long palpitations. States these do not bother her. She had minimal non-obstructive CAD on cath last year and echo as noted in HPI. She does have a history of PACs on prior EKG that could account for palpitations. Could possibly be PVCs vs another arrhythmia. I discussed the options of EKG and cardiology referral for possible loop recorder with the patient and she declined these evaluations at this time stating that it has been a life long issue and it does not bother her to a great extent. She preferred to observe this at this time despite my advising to obtain an EKG and possible cardiology referral. Will continue to monitor per patient wishes. She is to follow-up if this changes in any manner. Given return precautions.

## 2014-07-05 ENCOUNTER — Ambulatory Visit (HOSPITAL_COMMUNITY)
Admission: RE | Admit: 2014-07-05 | Discharge: 2014-07-05 | Disposition: A | Discharge status: Home / Self Care | Payer: Medicare Other | Source: Ambulatory Visit | Attending: Family Medicine | Admitting: Family Medicine

## 2014-07-05 ENCOUNTER — Telehealth: Payer: Self-pay | Admitting: Family Medicine

## 2014-07-05 ENCOUNTER — Other Ambulatory Visit: Payer: Self-pay

## 2014-07-05 DIAGNOSIS — R51 Headache: Secondary | ICD-10-CM | POA: Insufficient documentation

## 2014-07-05 DIAGNOSIS — Z803 Family history of malignant neoplasm of breast: Secondary | ICD-10-CM

## 2014-07-05 DIAGNOSIS — R519 Headache, unspecified: Secondary | ICD-10-CM

## 2014-07-05 DIAGNOSIS — Z1231 Encounter for screening mammogram for malignant neoplasm of breast: Secondary | ICD-10-CM

## 2014-07-05 NOTE — Telephone Encounter (Signed)
Attempted to call patient with regards to her head ct. There was no answer and no VM set up. Her CT head did not reveal any intracranial pathology to cause the headaches. Will send a letter with this information. Letter routed to admin staff to send to patient.

## 2014-07-06 ENCOUNTER — Ambulatory Visit (INDEPENDENT_AMBULATORY_CARE_PROVIDER_SITE_OTHER): Payer: Medicare Other | Admitting: *Deleted

## 2014-07-06 ENCOUNTER — Telehealth: Payer: Self-pay | Admitting: *Deleted

## 2014-07-06 VITALS — BP 150/80 | HR 78

## 2014-07-06 DIAGNOSIS — Z136 Encounter for screening for cardiovascular disorders: Secondary | ICD-10-CM

## 2014-07-06 DIAGNOSIS — R030 Elevated blood-pressure reading, without diagnosis of hypertension: Secondary | ICD-10-CM

## 2014-07-06 DIAGNOSIS — Z0130 Encounter for examination of blood pressure without abnormal findings: Secondary | ICD-10-CM

## 2014-07-06 DIAGNOSIS — IMO0001 Reserved for inherently not codable concepts without codable children: Secondary | ICD-10-CM

## 2014-07-06 NOTE — Telephone Encounter (Signed)
Appt 08/04/14 at 9 AM.  Advised pt to call if SOB increases or go to ED if chest pain, SOB, dizziness or numbness/tingling of extremities.   Derl Barrow, RN

## 2014-07-06 NOTE — Progress Notes (Signed)
Patient ID: Diane Freeman, female   DOB: [birth year], 69 y.o.   MRN: [id number] Patients BP at goal for her age. She should schedule follow-up in clinic for her shortness of breath. She previously denies dyspnea. She has already been advised by nursing to go to ED for CP, SOB, headache, dizziness, or numbness/tingling in extremities. This should be reinforced when she is called about follow-up.

## 2014-07-06 NOTE — Progress Notes (Signed)
   Pt in nurse clinic for blood pressure check.  BP 150/80 manually, heart rate 78.  Pt denies any symptoms today.  Pt stated she does have headache, SOB at times; but denies today.  Will forward to PCP.  Pt advised to call clinic for an appt or go to ED if she develops any chest pain, severe headache, dizziness, SOB or numbness/tingling of  extremities. Pt stated understanding.  Derl Barrow, RN

## 2014-07-06 NOTE — Telephone Encounter (Signed)
-----   Message from Leone Haven, MD sent at 07/06/2014 12:04 PM EDT ----- Also, in addending this note it states that the provided needs to be changed from a generic provider. Not sure how to change this and it won't let me close the encounter with out this change.

## 2014-07-08 IMAGING — CR DG CHEST 2V
2 series · 2 of 2 positions shown · non-contrast
Comparison: 05/29/2011

CLINICAL DATA: Left-sided chest pain

EXAM:
CHEST  2 VIEW

[w chest pa]
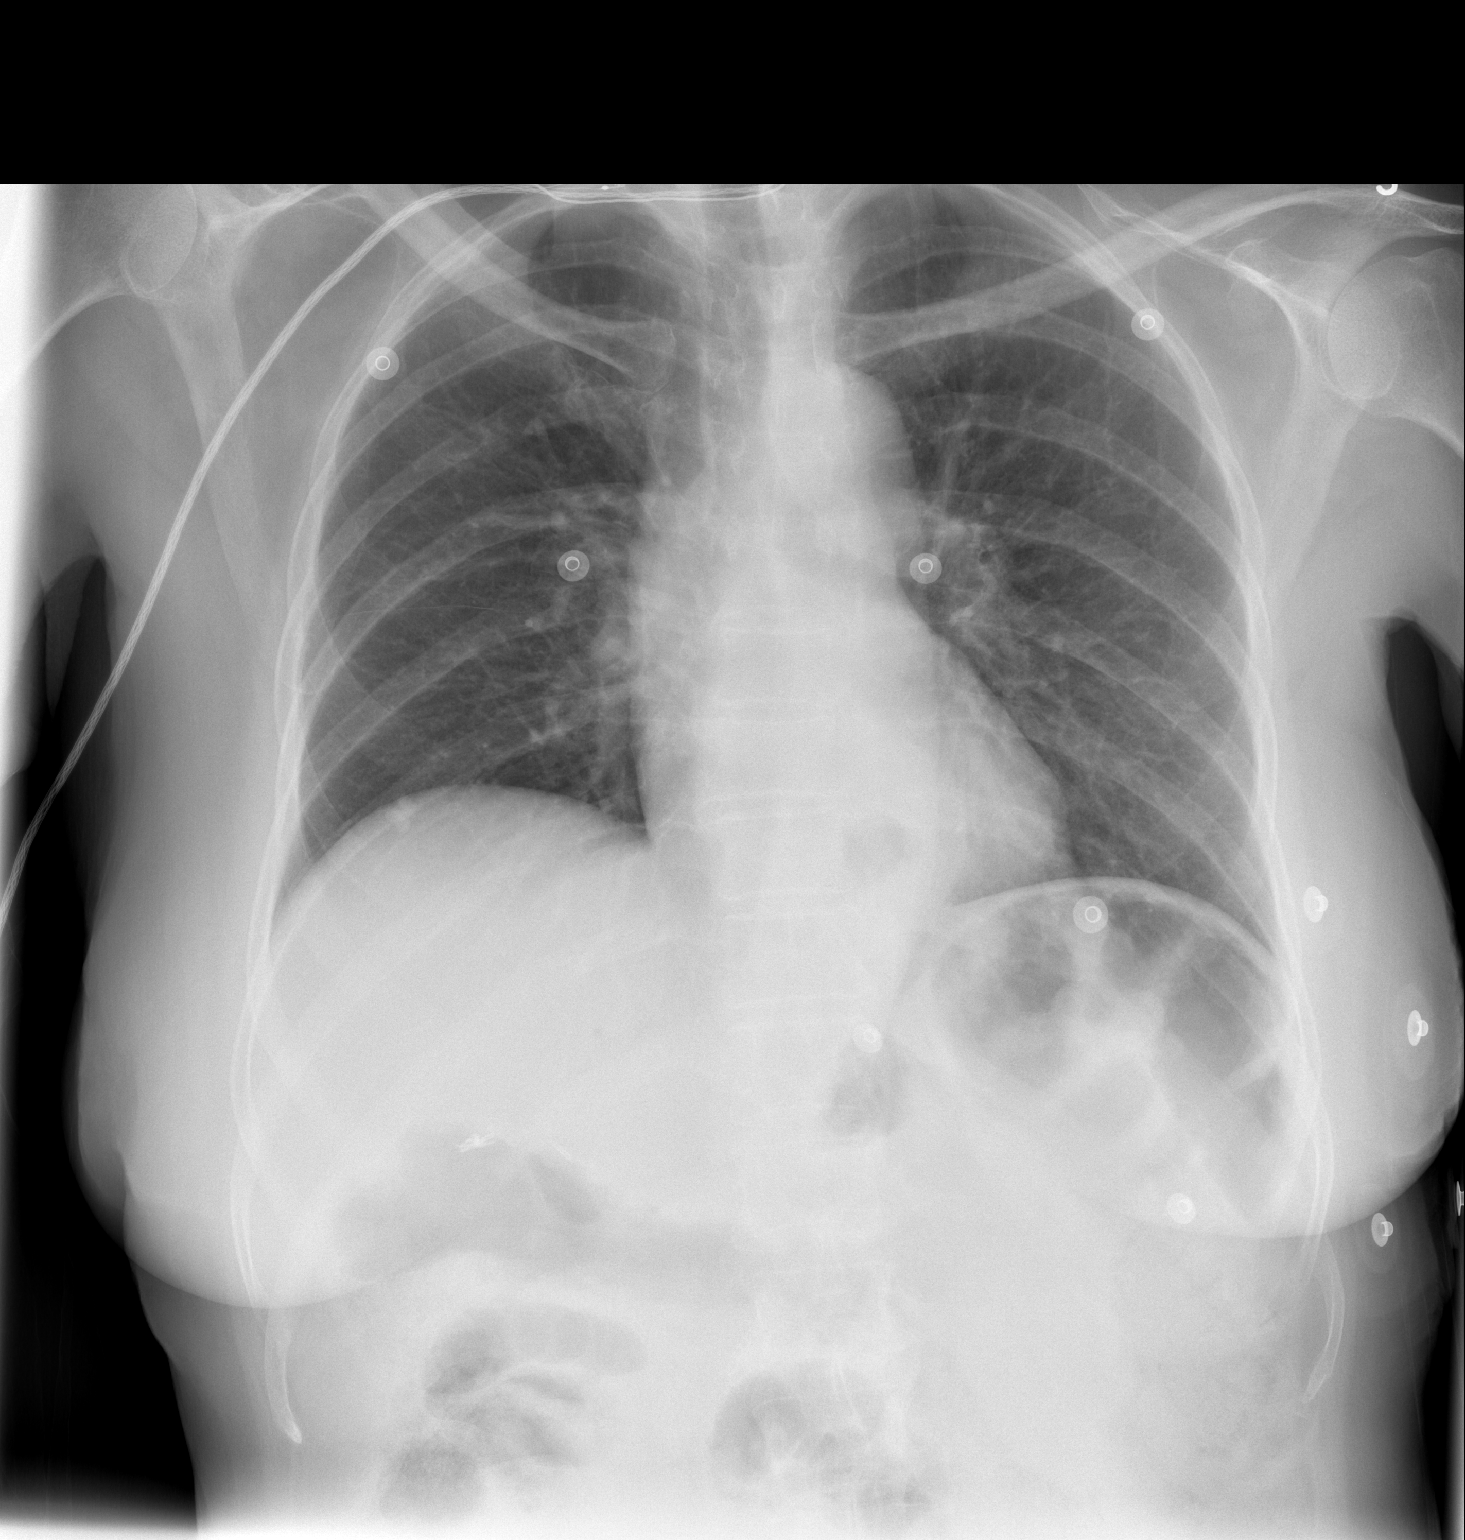

[w chest lat]
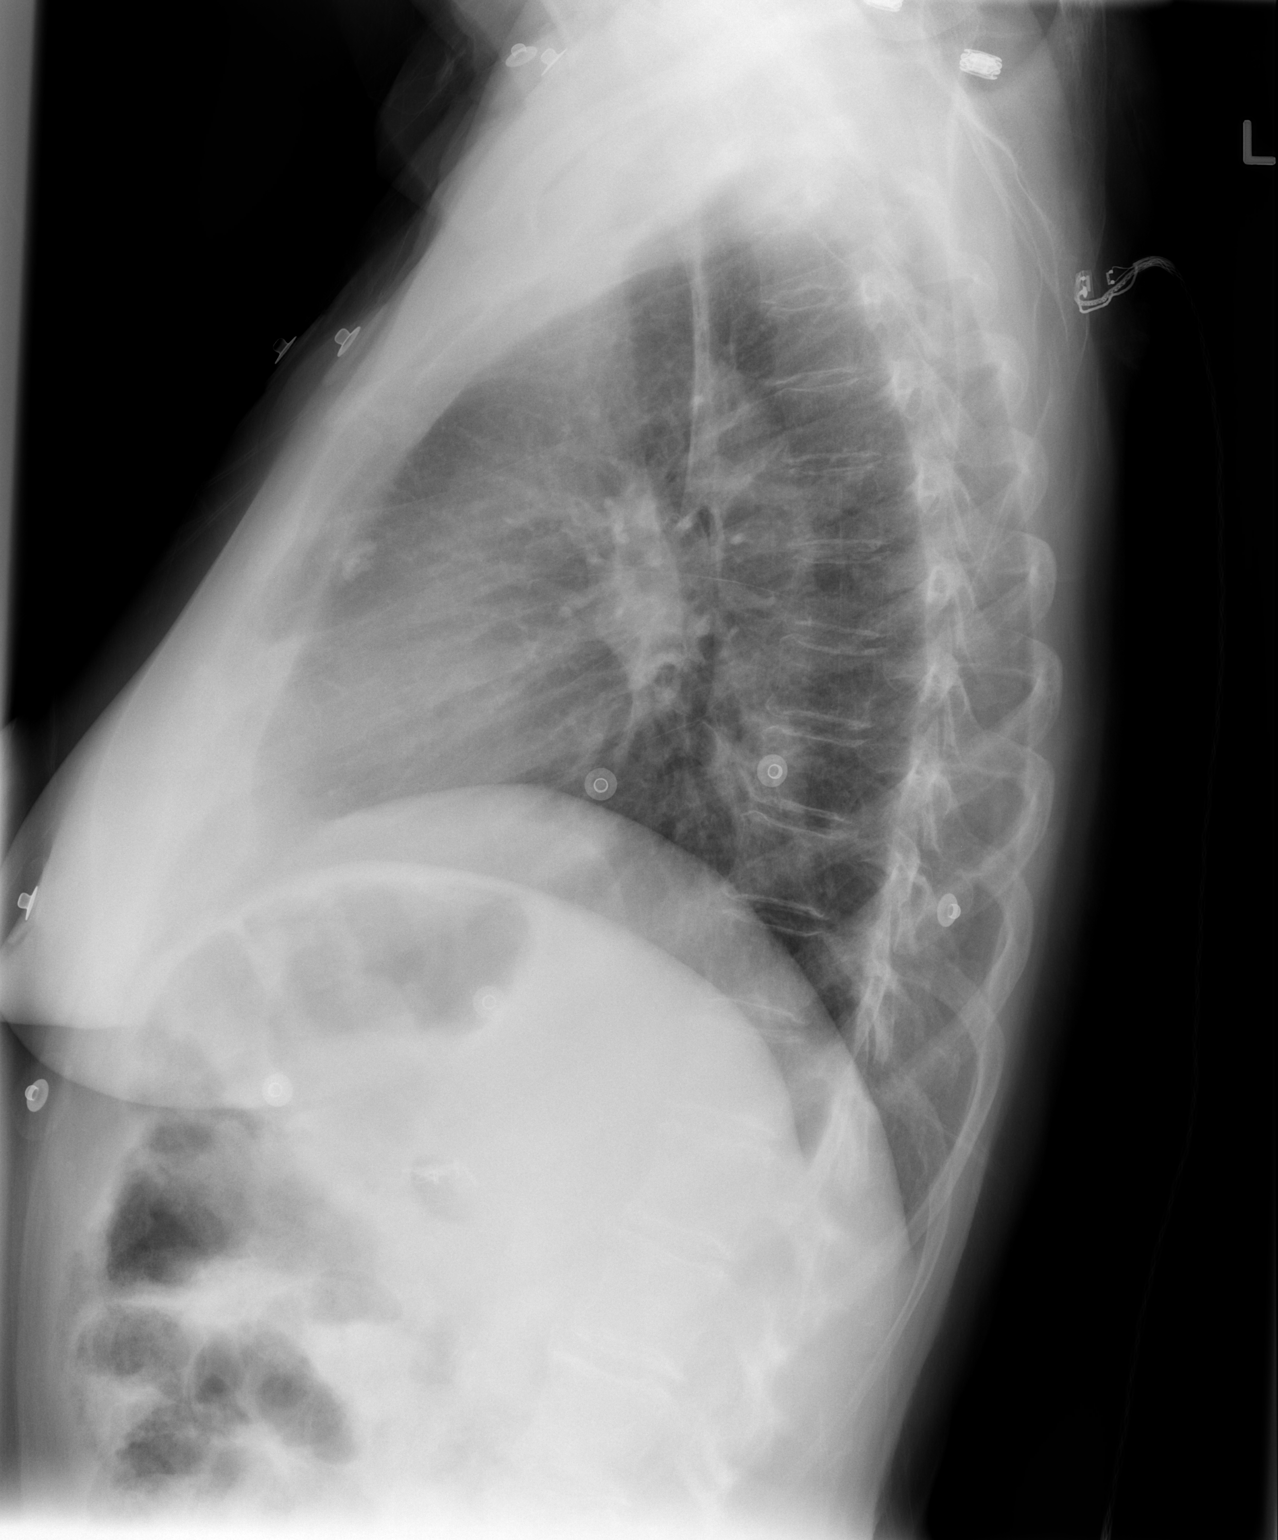

[2 of 2 positions shown; findings below may reference images not displayed]

FINDINGS: Small hiatal hernia is noted. The cardiac shadow is stable. The
lungs are clear bilaterally. No acute bony abnormality is seen.
IMPRESSION: No active cardiopulmonary disease.

## 2014-07-13 ENCOUNTER — Ambulatory Visit
Admission: RE | Admit: 2014-07-13 | Discharge: 2014-07-13 | Disposition: A | Discharge status: Home / Self Care | Payer: Medicare Other | Source: Ambulatory Visit

## 2014-07-13 DIAGNOSIS — Z1231 Encounter for screening mammogram for malignant neoplasm of breast: Secondary | ICD-10-CM | POA: Diagnosis not present

## 2014-07-13 DIAGNOSIS — Z803 Family history of malignant neoplasm of breast: Secondary | ICD-10-CM

## 2014-07-26 IMAGING — CT CT HEAD W/O CM
1 series · 16 of 30 positions shown, 20 images · non-contrast
Comparison: 06/26/2013 CT.

CLINICAL DATA: Fell 4 days ago. Left-sided subdural hematoma.
Follow-up.

EXAM:
CT HEAD WITHOUT CONTRAST
TECHNIQUE: Contiguous axial images were obtained from the base of the skull
through the vertex without intravenous contrast.

[Series 2: head 5.0 h30s · axial · 0.45mm/px · z∈[-46,+89]mm · 16 of 30 slices shown, 20 images]
[im 2/30  brain]
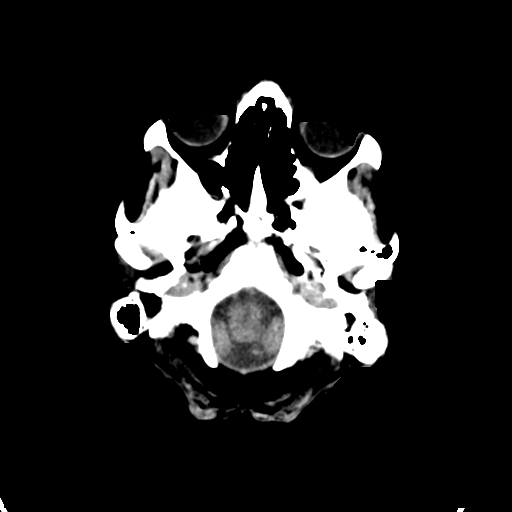
[im 2/30  bone]
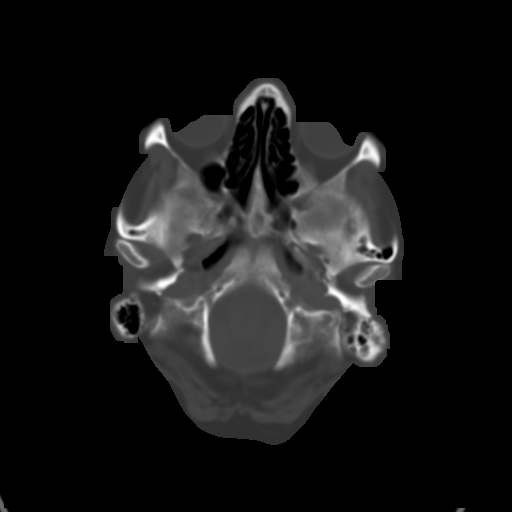
[im 4/30  brain]
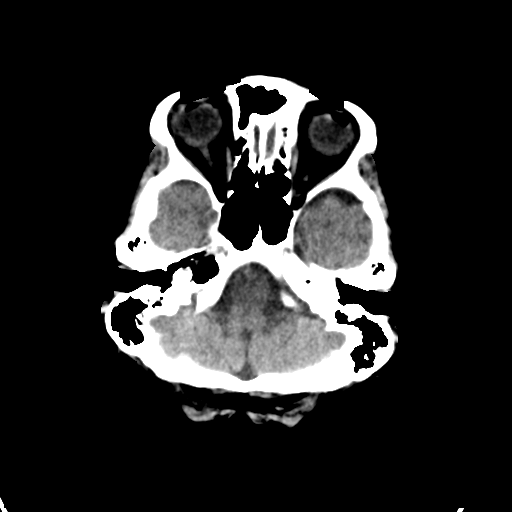
[im 6/30  brain]
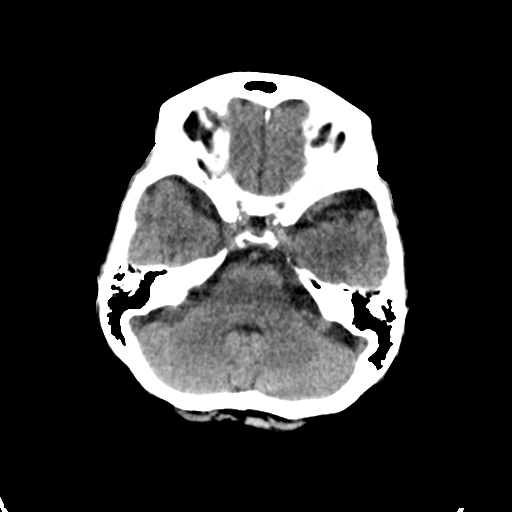
[im 8/30  brain]
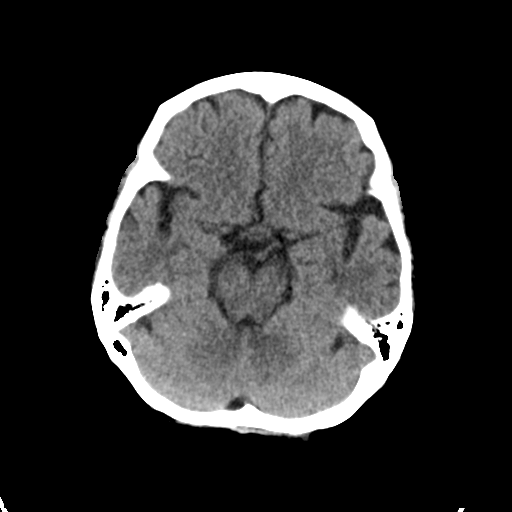
[im 9/30  brain]
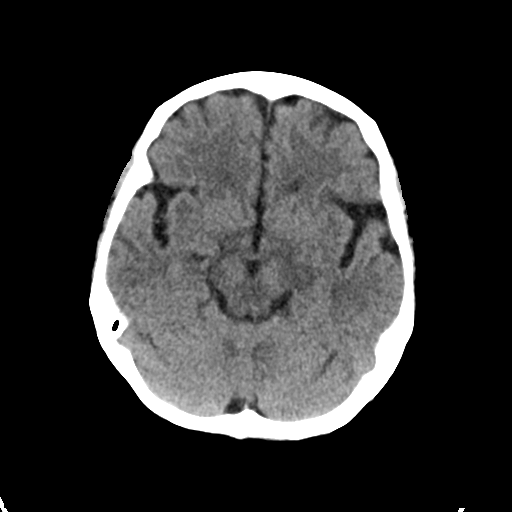
[im 9/30  bone]
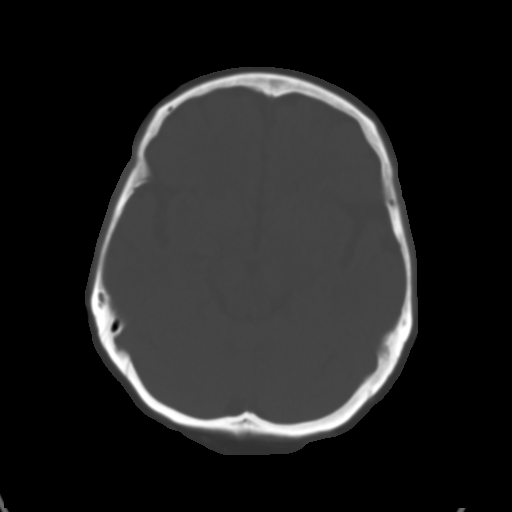
[im 11/30  brain]
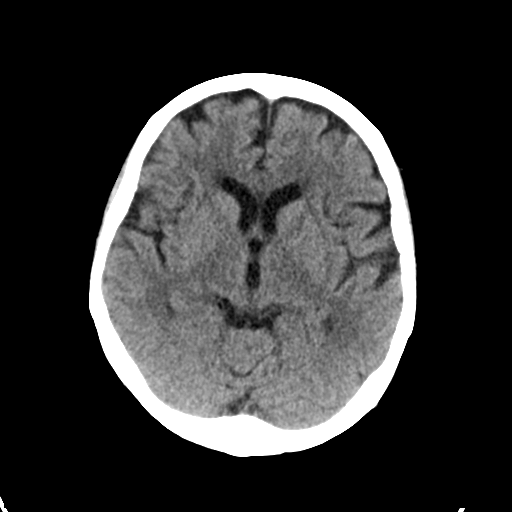
[im 13/30  brain]
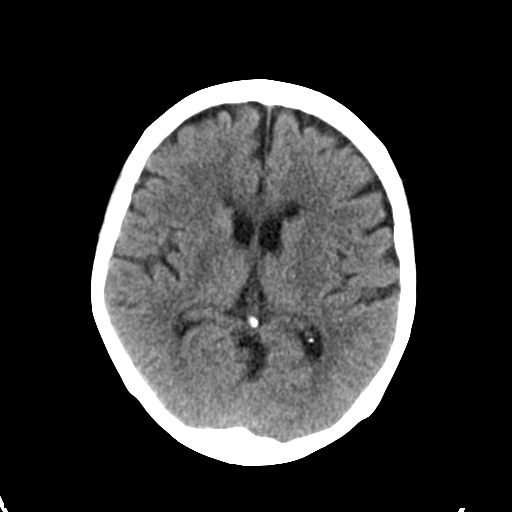
[im 15/30  brain]
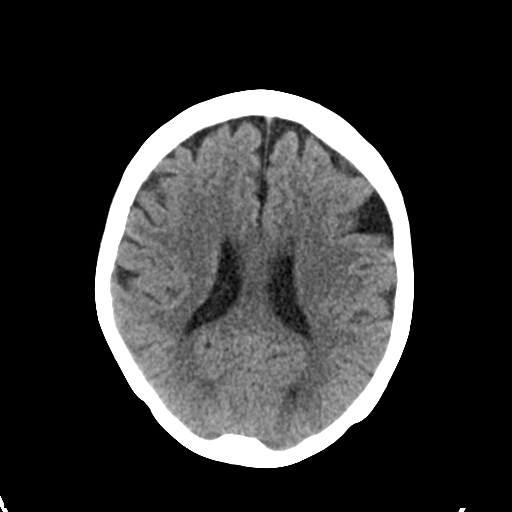
[im 16/30  brain]
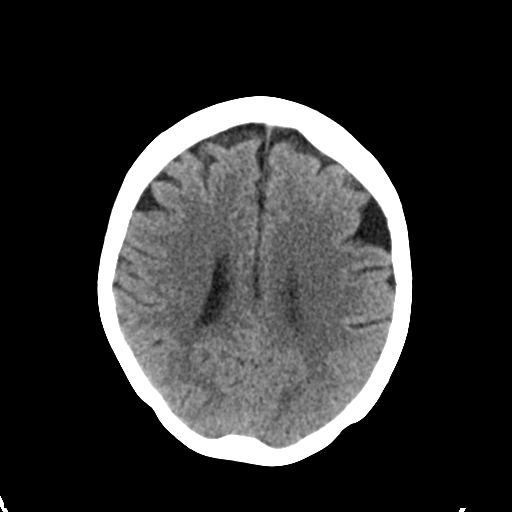
[im 16/30  bone]
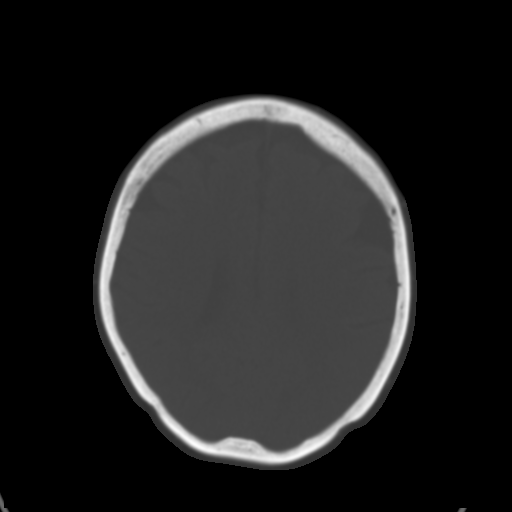
[im 18/30  brain]
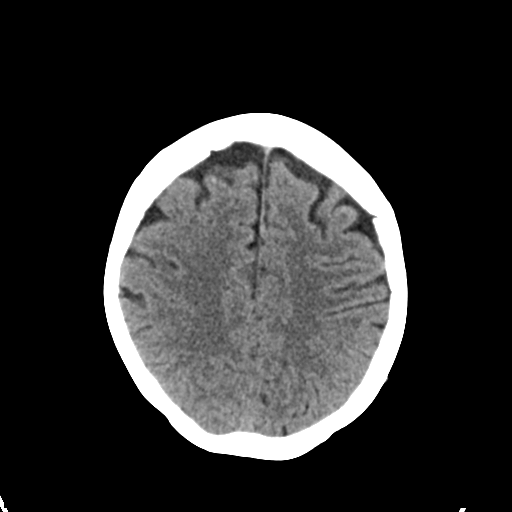
[im 20/30  brain]
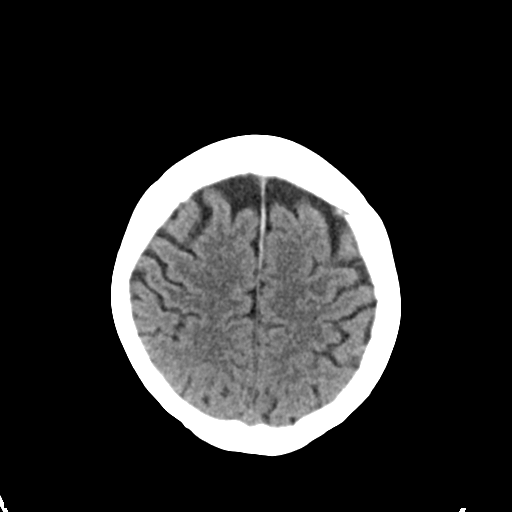
[im 22/30  brain]
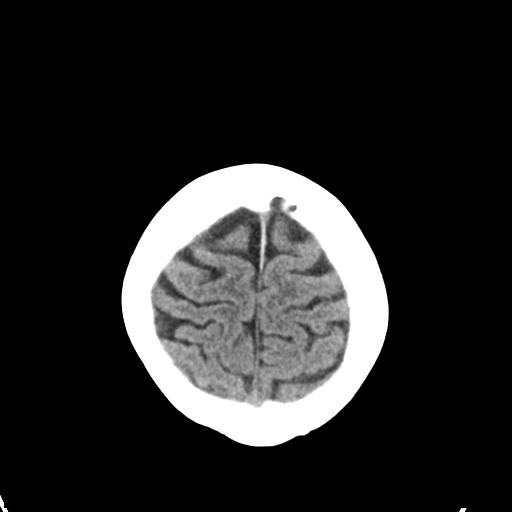
[im 23/30  brain]
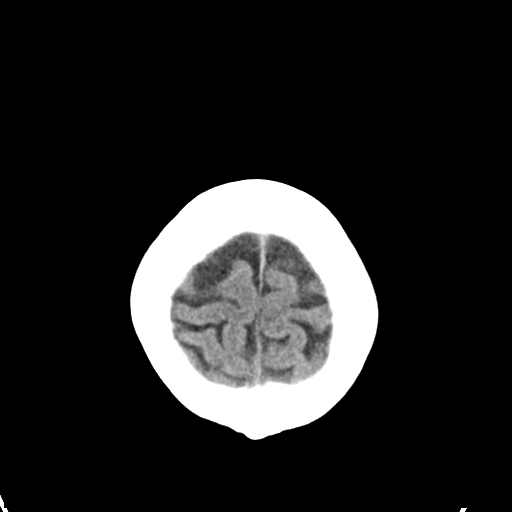
[im 23/30  bone]
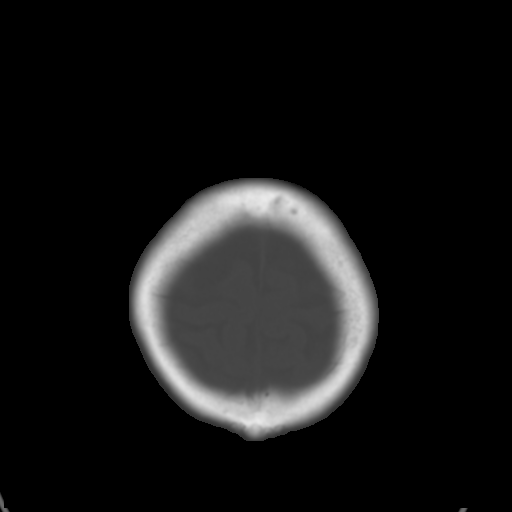
[im 25/30  brain]
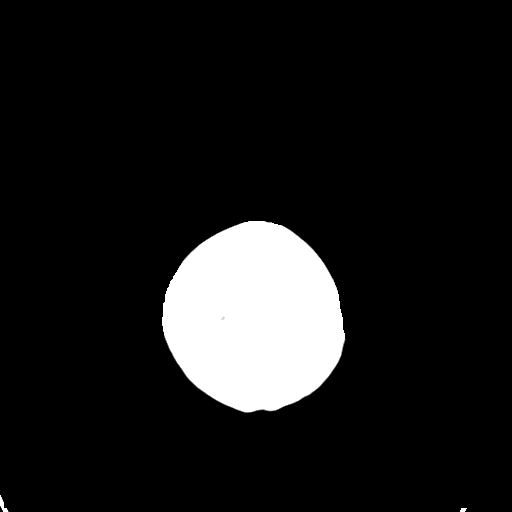
[im 27/30  brain]
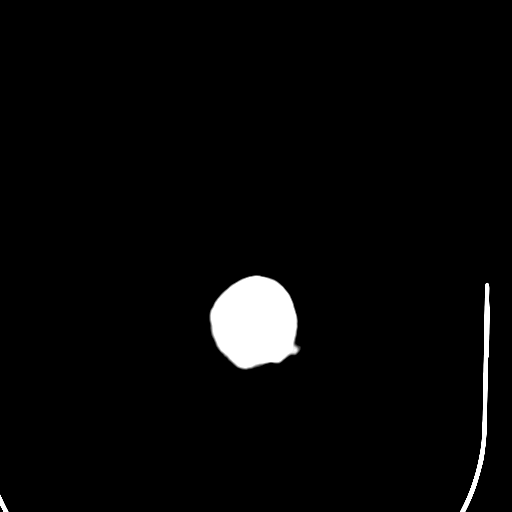
[im 29/30  brain]
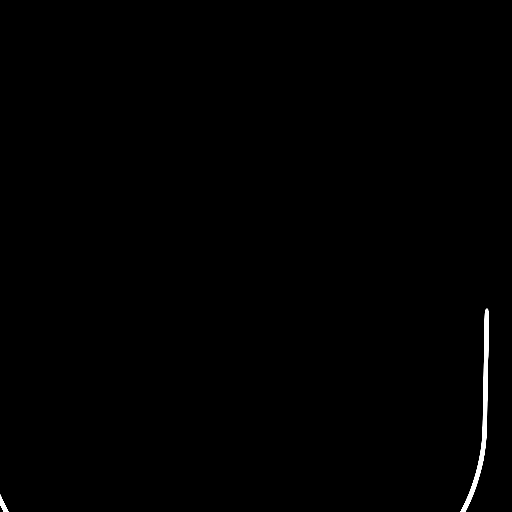

[16 of 30 positions shown; findings below may reference images not displayed]

FINDINGS: Decreased size of left parafalcine subdural hematoma (2 mm thickness
versus prior 3 mm).

No new intracranial hemorrhage.

Remote infarct left caudate head region suspected. No CT evidence of
large acute infarct.

Mild atrophy without hydrocephalus.

No intracranial mass lesion noted on this unenhanced exam.

Sclerotic appearance of the upper aspect of the left maxillary sinus
with bony overgrowth may be related to prior sinus disease.
IMPRESSION: Decreased size of left parafalcine subdural hematoma.

No new intracranial hemorrhage.

## 2014-08-04 ENCOUNTER — Ambulatory Visit (INDEPENDENT_AMBULATORY_CARE_PROVIDER_SITE_OTHER): Payer: Medicare Other | Admitting: Family Medicine

## 2014-08-04 ENCOUNTER — Encounter: Payer: Self-pay | Admitting: Family Medicine

## 2014-08-04 ENCOUNTER — Ambulatory Visit (HOSPITAL_COMMUNITY)
Admission: RE | Admit: 2014-08-04 | Discharge: 2014-08-04 | Disposition: A | Discharge status: Home / Self Care | Payer: Medicare Other | Source: Ambulatory Visit | Attending: Family Medicine | Admitting: Family Medicine

## 2014-08-04 VITALS — BP 158/82 | HR 67 | Temp 98.1°F | Ht 63.0 in | Wt 134.6 lb

## 2014-08-04 DIAGNOSIS — R030 Elevated blood-pressure reading, without diagnosis of hypertension: Secondary | ICD-10-CM

## 2014-08-04 DIAGNOSIS — R002 Palpitations: Secondary | ICD-10-CM

## 2014-08-04 DIAGNOSIS — I10 Essential (primary) hypertension: Secondary | ICD-10-CM | POA: Diagnosis not present

## 2014-08-04 DIAGNOSIS — R0600 Dyspnea, unspecified: Secondary | ICD-10-CM | POA: Diagnosis not present

## 2014-08-04 DIAGNOSIS — IMO0001 Reserved for inherently not codable concepts without codable children: Secondary | ICD-10-CM

## 2014-08-04 LAB — CBC
HEMATOCRIT: 38.5 % (ref 36.0–46.0)
HEMOGLOBIN: 12.9 g/dL (ref 12.0–15.0)
MCH: 26.6 pg (ref 26.0–34.0)
MCHC: 33.5 g/dL (ref 30.0–36.0)
MCV: 79.4 fL (ref 78.0–100.0)
MPV: 10.4 fL (ref 8.6–12.4)
Platelets: 210 10*3/uL (ref 150–400)
RBC: 4.85 MIL/uL (ref 3.87–5.11)
RDW: 18.2 % — AB (ref 11.5–15.5)
WBC: 7.3 10*3/uL (ref 4.0–10.5)

## 2014-08-04 LAB — LIPID PANEL
CHOL/HDL RATIO: 6.9 ratio
Cholesterol: 192 mg/dL (ref 0–200)
HDL: 28 mg/dL — ABNORMAL LOW (ref >=46)
LDL CALC: 119 mg/dL — AB (ref 0–99)
Triglycerides: 226 mg/dL — ABNORMAL HIGH (ref <150)
VLDL: 45 mg/dL — AB (ref 0–40)

## 2014-08-04 LAB — TSH: TSH: 1.973 u[IU]/mL (ref 0.350–4.500)

## 2014-08-04 NOTE — Patient Instructions (Signed)
Nice to see you. We are going to refer you to cardiology for further evaluation of your palpitations.  I would like for you to see our pharmacist for ambulatory blood pressure monitoring as your blood pressures at home do not line up with ours in the office. Please set this up for the next 1-2 weeks. If you develop chest pain, shortness of breath, palpitations, trouble breathing on laying flat please seek medical attention.

## 2014-08-04 NOTE — Assessment & Plan Note (Signed)
Elevated again today. Repeat improved, though not at goal. Given that she reports normal BPs at home one could suspect white coat HTN, though will need to prove this with ambulatory BP monitoring. Patient is to see Dr Valentina Lucks for this in the next 1-2 weeks. Will check lipid panel today.

## 2014-08-04 NOTE — Progress Notes (Signed)
Patient ID: Diane Freeman, female   DOB: 02/04/1946, 69 y.o.   MRN: [id number]  [name], MD Phone: [phone]  Diane Freeman is a 69 y.o. female who presents today for f/u.  Elevated BP Disease Monitoring Home BP Monitoring checking daily, notes typically in 118-120/70-72 range, highest systolic at home is in 469'G Chest pain- no    Dyspnea- yes, see below Medications Compliance-  Not on medication at this time.  Edema- no  Dyspnea: notes this happens "every once in a while" (every 2-3 days) when she is particularly active. States mostly if she moves around too fast and occasionally with walking up stairs. No associated chest pain or palpitations. No history of PE or DVT. No history of thyroid issues. Has a history of anemia. No fever, cough, or smoking history. No orthopnea, PND, or edema. Had echo 06/13/13 that revealed normal systolic function and grade I diastolic dysfunction. Had cardiac cath last year with patent coronary arteries with minimal nonobstructive CAD.  Palpitations: no change in this issue. Reports life long symptoms. Notes she has to catch her breath when these come on. Occasionally will startle her. Would like to see cardiology for these at this time.  PMH: nonsmoker.    ROS: Per HPI   Physical Exam Filed Vitals:   08/04/14 0930  BP: 158/82  Pulse:   Temp:   Ambulatory O2 sat 92% O2 sat resting 99%  Gen: Well NAD HEENT: PERRL,  MMM, no thyromegaly, no cervical LAD Lungs: CTABL Nl WOB Heart: RRR, no JVD, no murmur appreciated Exts: Non edematous BL  LE, warm and well perfused.   EKG: NSR rate 60, nonspecific ST changes, no change from prior EKG   Assessment/Plan: Please see individual problem list.  [name], MD [place] PGY-3

## 2014-08-05 DIAGNOSIS — R0902 Hypoxemia: Secondary | ICD-10-CM | POA: Diagnosis present

## 2014-08-05 DIAGNOSIS — R0600 Dyspnea, unspecified: Secondary | ICD-10-CM | POA: Insufficient documentation

## 2014-08-05 DIAGNOSIS — R0602 Shortness of breath: Secondary | ICD-10-CM | POA: Insufficient documentation

## 2014-08-05 NOTE — Assessment & Plan Note (Signed)
Continues to have intermittent palpitations. Would like referral to cardiology at this time. Referral placed. Given return precautions.

## 2014-08-05 NOTE — Assessment & Plan Note (Signed)
>>  ASSESSMENT AND PLAN FOR SHORTNESS OF BREATH WRITTEN ON 08/05/2014  1:27 PM BY SONNENBERG, ERIC G, MD  Patient with dyspnea on exertion. Differential includes cardiac causes (EKG with no ischemic changes, cath last year with minimal non-obstructive CAD, Echo with grade I diastolic dysfunction), pulmonary cause (PE unlikely with no history of clot, no tachycardia, and normal O2 sat. Doubt PNA with normal O2 sat and no fever. No smoking history makes COPD unlikely), anemia (normal hgb on CBC), thyroid dysfunction (TSH normal), and deconditioning. Patient being referred to cardiology for palpitations and will have patient evaluate for dyspnea as well. If not cardiac would consider PFTs for lung function evaluation. Given return precautions.

## 2014-08-05 NOTE — Assessment & Plan Note (Signed)
Patient with dyspnea on exertion. Differential includes cardiac causes (EKG with no ischemic changes, cath last year with minimal non-obstructive CAD, Echo with grade I diastolic dysfunction), pulmonary cause (PE unlikely with no history of clot, no tachycardia, and normal O2 sat. Doubt PNA with normal O2 sat and no fever. No smoking history makes COPD unlikely), anemia (normal hgb on CBC), thyroid dysfunction (TSH normal), and deconditioning. Patient being referred to cardiology for palpitations and will have patient evaluate for dyspnea as well. If not cardiac would consider PFTs for lung function evaluation. Given return precautions.

## 2014-08-10 ENCOUNTER — Ambulatory Visit: Payer: Medicare Other | Admitting: Cardiovascular Disease

## 2014-08-11 ENCOUNTER — Telehealth: Payer: Self-pay | Admitting: Family Medicine

## 2014-08-11 DIAGNOSIS — E785 Hyperlipidemia, unspecified: Secondary | ICD-10-CM

## 2014-08-11 DIAGNOSIS — R7303 Prediabetes: Secondary | ICD-10-CM

## 2014-08-11 MED ORDER — ATORVASTATIN CALCIUM 40 MG PO TABS: 40.0000 mg | ORAL_TABLET | Freq: Every day | ORAL | Status: DC

## 2014-08-11 NOTE — Telephone Encounter (Signed)
Called patient to discuss lipid panel results. Advised that she would benefit from a statin to help reduce her risk of MI and stroke in the future. She was agreeable to this. Will check CMET for liver function when she comes in for pharmacy visit next week. Will also check A1c at that time.

## 2014-08-19 ENCOUNTER — Ambulatory Visit (INDEPENDENT_AMBULATORY_CARE_PROVIDER_SITE_OTHER): Payer: Medicare Other | Admitting: Pharmacist

## 2014-08-19 ENCOUNTER — Encounter: Payer: Self-pay | Admitting: Pharmacist

## 2014-08-19 ENCOUNTER — Encounter: Payer: Self-pay | Admitting: Family Medicine

## 2014-08-19 ENCOUNTER — Other Ambulatory Visit (INDEPENDENT_AMBULATORY_CARE_PROVIDER_SITE_OTHER): Payer: Medicare Other

## 2014-08-19 VITALS — BP 176/86 | HR 71 | Ht 63.0 in | Wt 136.0 lb

## 2014-08-19 DIAGNOSIS — R030 Elevated blood-pressure reading, without diagnosis of hypertension: Secondary | ICD-10-CM | POA: Diagnosis not present

## 2014-08-19 DIAGNOSIS — R7303 Prediabetes: Secondary | ICD-10-CM

## 2014-08-19 DIAGNOSIS — E785 Hyperlipidemia, unspecified: Secondary | ICD-10-CM

## 2014-08-19 DIAGNOSIS — R7309 Other abnormal glucose: Secondary | ICD-10-CM | POA: Diagnosis present

## 2014-08-19 DIAGNOSIS — IMO0001 Reserved for inherently not codable concepts without codable children: Secondary | ICD-10-CM

## 2014-08-19 LAB — COMPREHENSIVE METABOLIC PANEL
ALT: 12 U/L (ref 0–35)
AST: 18 U/L (ref 0–37)
Albumin: 4 g/dL (ref 3.5–5.2)
Alkaline Phosphatase: 94 U/L (ref 39–117)
BUN: 11 mg/dL (ref 6–23)
CO2: 24 meq/L (ref 19–32)
CREATININE: 0.86 mg/dL (ref 0.50–1.10)
Calcium: 9.3 mg/dL (ref 8.4–10.5)
Chloride: 103 mEq/L (ref 96–112)
Glucose, Bld: 93 mg/dL (ref 70–99)
Potassium: 3.9 mEq/L (ref 3.5–5.3)
Sodium: 139 mEq/L (ref 135–145)
Total Bilirubin: 0.4 mg/dL (ref 0.2–1.2)
Total Protein: 7.5 g/dL (ref 6.0–8.3)

## 2014-08-19 LAB — POCT GLYCOSYLATED HEMOGLOBIN (HGB A1C): Hemoglobin A1C: 5

## 2014-08-19 NOTE — Progress Notes (Signed)
S:    Patient arrives pleasant but nervous presenting to the clinic for ambulatory blood pressure evaluation.  No prior diagnosis of hypertension, with self-reported home SBP readings in the 120's but higher at night in the 140's/150's.  Reports having never taken BP medications previously.  Medication compliance is reported to be consistent, but states that she does not like taking medications.  Discussed procedure for wearing the monitor and gave patient written instructions. Monitor was placed on non-dominant arm with instructions to return in the morning.   Patient returned to the clinic and reported no issues with blood pressure monitor.  She did report that she had difficulty sleeping and woke up to use the restroom 3 times during the night.   She reports that driving herself to the doctor is anxiety provoking AND reports she has dizziness with positional changes.   O:   Last 3 Office BP readings: BP Readings from Last 3 Encounters:  08/19/14 176/86  08/04/14 158/82  07/06/14 150/80     BPM Study Data: Arm Placement left arm   Overall Mean 24hr BP:   134/71 mmHg HR: 70   Daytime Mean BP:  135/73 mmHg HR: 70   Nighttime Mean BP:  132/68 mmHg HR: 70  Dipping Pattern: No.  Sys:   2.6%   Dia: 6.4%   [normal dipping ~10-20%]   Non-hypertensive ABPM thresholds: daytime BP <135/85 mmHg, sleeptime BP <120/70 mmHg NICE Hypertension Guidelines ([place]) using ABPM: Stage I: >135/85 mmHg, Stage 2: >150/95 mmHg)   BMET    Component Value Date/Time   NA 136* 06/19/2013 0327   K 3.9 06/19/2013 0327   CL 100 06/19/2013 0327   CO2 23 06/19/2013 0327   GLUCOSE 97 06/19/2013 0327   BUN 10 06/19/2013 0327   CREATININE 0.69 06/19/2013 0327   CREATININE 0.97 05/29/2011 1020   CALCIUM 7.7* 06/19/2013 0327   GFRNONAA 87* 06/19/2013 0327   GFRAA >90 06/19/2013 0327    A/P: History of elevated blood pressures in office over the last 1 year.   Found to have white coat hypertension (BP  at goal) with 24-hour ambulatory blood pressure demonstrating 134/71 mmHg average blood pressure.   She has multiple systolic readings less than 120 and multiple diastolic readings less than 60 while asleep.  Her nocturnal dipping pattern interpretation is complicated by the fact she was awakened multiple times during the night by the monitor.  No suggestion for additional medications for blood pressure.   Discussed positional changes with blood pressure to avoid dizziness, and falls.   Discussed possibility of having someone drive her to the doctor to avoid elevations secondary to driving when coming to the doctor.  Results reviewed and written information provided.   F/U Clinic Visit with Dr. Caryl Bis. Total time in face-to-face counseling 20 minutes.  Patient seen with Jonna Munro, PharmD Candidate.

## 2014-08-19 NOTE — Patient Instructions (Addendum)
Blood pressure monitor report demonstrates well controlled blood pressure throughout the day when outside the office.

## 2014-08-19 NOTE — Progress Notes (Signed)
CMP AND A1C DONE TODAY Diane Freeman

## 2014-08-20 ENCOUNTER — Encounter: Payer: Self-pay | Admitting: Family Medicine

## 2014-08-20 ENCOUNTER — Ambulatory Visit: Payer: Medicare Other | Admitting: Pharmacist

## 2014-08-20 NOTE — Assessment & Plan Note (Signed)
History of elevated blood pressures in office over the last 1 year.   Found to have white coat hypertension (BP at goal) with 24-hour ambulatory blood pressure demonstrating 134/71 mmHg average blood pressure.   She has multiple systolic readings less than 120 and multiple diastolic readings less than 60 while asleep.  Her nocturnal dipping pattern interpretation is complicated by the fact she was awakened multiple times during the night by the monitor.  No suggestion for additional medications for blood pressure.   Discussed positional changes with blood pressure to avoid dizziness, and falls.   Discussed possibility of having someone drive her to the doctor to avoid elevations secondary to driving when coming to the doctor.  Results reviewed and written information provided.

## 2014-08-20 NOTE — Progress Notes (Signed)
Patient ID: Diane Freeman, female   DOB: Feb 19, 1946, 69 y.o.   MRN: [id number] Reviewed: Agree with Dr. Graylin Shiver documentation and management.

## 2014-08-31 ENCOUNTER — Ambulatory Visit (INDEPENDENT_AMBULATORY_CARE_PROVIDER_SITE_OTHER): Payer: Medicare Other | Admitting: Cardiovascular Disease

## 2014-08-31 ENCOUNTER — Encounter: Payer: Self-pay | Admitting: Cardiovascular Disease

## 2014-08-31 DIAGNOSIS — R002 Palpitations: Secondary | ICD-10-CM

## 2014-08-31 NOTE — Patient Instructions (Signed)
Medication Instructions:  Your physician recommends that you continue on your current medications as directed. Please refer to the Current Medication list given to you today.  Labwork: No new orders.  Testing/Procedures: Your physician has recommended that you wear a 48 hour holter monitor. Holter monitors are medical devices that record the heart's electrical activity. Doctors most often use these monitors to diagnose arrhythmias. Arrhythmias are problems with the speed or rhythm of the heartbeat. The monitor is a small, portable device. You can wear one while you do your normal daily activities. This is usually used to diagnose what is causing palpitations/syncope (passing out).  Follow-Up: Your physician recommends that you schedule a follow-up appointment as needed with Dr Fletcher Anon.    Any Other Special Instructions Will Be Listed Below (If Applicable).

## 2014-09-01 ENCOUNTER — Telehealth: Payer: Self-pay | Admitting: *Deleted

## 2014-09-01 MED ORDER — OMEPRAZOLE 20 MG PO CPDR: 20.0000 mg | DELAYED_RELEASE_CAPSULE | Freq: Every morning | ORAL | Status: DC

## 2014-09-01 NOTE — Telephone Encounter (Signed)
Sent Rx for omeprazole, 20 mg, #90 with one refill to [facility] in [place], [place].

## 2014-09-02 NOTE — Progress Notes (Signed)
Primary care physician: Dr. Caryl Bis  HPI  This is a 69 year old female who was referred for evaluation of palpitations.she has known history of Crohn's disease, subdural hematoma after a fall last year, GERD, hyperlipidemia and elevated blood pressure due to white coat syndrome.  She underwent a right and left cardiac catheterization in March 2015 which showed minimal coronary artery diseasewith normal hemodynamics and LV systolic function and no evidence of shunting. She has been experiencing intermittent episodes of palpitations described as skipping in her heart with no dizziness, syncope or presyncope. No chest pain. She does not consume excessive amount of caffeine and does not use over-the-counter medications. Recent labs were unremarkable including normal thyroid function.  No Known Allergies   Current Outpatient Prescriptions on File Prior to Visit  Medication Sig Dispense Refill  . atorvastatin (LIPITOR) 40 MG tablet Take 1 tablet (40 mg total) by mouth daily. 90 tablet 3  . cyanocobalamin (,VITAMIN B-12,) 1000 MCG/ML injection Inject 1 mL (1,000 mcg total) into the muscle every 30 (thirty) days. 1 mL 5  . IRON PO Take 1 tablet by mouth daily.    . mesalamine (APRISO) 0.375 G 24 hr capsule Take 2 tablets by mouth three times daily 180 capsule 10   Current Facility-Administered Medications on File Prior to Visit  Medication Dose Route Frequency Provider Last Rate Last Dose  . cyanocobalamin ((VITAMIN B-12)) injection 1,000 mcg  1,000 mcg Intramuscular Q30 days [place] Dragon, MD   1,000 mcg at 04/08/14 0848     Past Medical History  Diagnosis Date  . Colitis   . GERD (gastroesophageal reflux disease)   . Anemia   . Adrenal nodule   . Diverticulosis   . Hx of gastritis   . History of esophageal stricture   . Choledocholithiasis with obstruction   . Diabetes mellitus     pt denies  . Hiatal hernia   . Blood transfusion without reported diagnosis   . Cataract     bil  removed  . Crohn's colitis 07/11/12  . Fatty liver      Past Surgical History  Procedure Laterality Date  . Cholecystectomy  2009  . Tubal ligation    . Cataract extraction    . Ercp  06/02/2011    Procedure: ENDOSCOPIC RETROGRADE CHOLANGIOPANCREATOGRAPHY (ERCP);  Surgeon: Gatha Mayer, MD;  Location: [facility];  Service: Endoscopy;  Laterality: N/A;  . Colonoscopy    . Upper gastrointestinal endoscopy    . Cardiac catheterization  05/2013  . Left and right heart catheterization with coronary angiogram N/A 06/15/2013    Procedure: LEFT AND RIGHT HEART CATHETERIZATION WITH CORONARY ANGIOGRAM- Shunt series;  Surgeon: Blane Ohara, MD;  Location: [facility] CATH LAB;  Service: Cardiovascular;  Laterality: N/A;     Family History  Problem Relation Age of Onset  . Bone cancer Maternal Grandmother   . Colon cancer Maternal Aunt 43  . Stomach cancer Sister 34    decsd  . Breast cancer Sister   . Esophageal cancer Neg Hx   . Rectal cancer Neg Hx      History   Social History  . Marital Status: Divorced    Spouse Name: N/A  . Number of Children: 2  . Years of Education: N/A   Occupational History  . Not on file.   Social History Main Topics  . Smoking status: Never Smoker   . Smokeless tobacco: Never Used  . Alcohol Use: No  . Drug Use: No  . Sexual  Activity: Not on file   Other Topics Concern  . Not on file   Social History Narrative       PHYSICAL EXAM   BP 162/90 mmHg  Pulse 69  Ht 5' 3"  (1.6 m)  Wt 136 lb 9.6 oz (61.961 kg)  BMI 24.20 kg/m2 Constitutional: She is oriented to person, place, and time. She appears well-developed and well-nourished. No distress.  HENT: No nasal discharge.  Head: Normocephalic and atraumatic.  Eyes: Pupils are equal and round. No discharge.  Neck: Normal range of motion. Neck supple. No JVD present. No thyromegaly present.  Cardiovascular: Normal rate, regular rhythm, normal heart sounds. Exam reveals no gallop and no  friction rub. No murmur heard.  Pulmonary/Chest: Effort normal and breath sounds normal. No stridor. No respiratory distress. She has no wheezes. She has no rales. She exhibits no tenderness.  Abdominal: Soft. Bowel sounds are normal. She exhibits no distension. There is no tenderness. There is no rebound and no guarding.  Musculoskeletal: Normal range of motion. She exhibits no edema and no tenderness.  Neurological: She is alert and oriented to person, place, and time. Coordination normal.  Skin: Skin is warm and dry. No rash noted. She is not diaphoretic. No erythema. No pallor.  Psychiatric: She has a normal mood and affect. Her behavior is normal. Judgment and thought content normal.     YLU:DAPTC rhythm with PACs and some in the form of atrial bigeminy. Nonspecific ST and T wave changes.   ASSESSMENT AND PLAN

## 2014-09-02 NOTE — Assessment & Plan Note (Signed)
I suspect that these are likely due to premature beats. Recent labs were unremarkable and she does not consume excessive amount of caffeine. Cardiac workup last year showed no evidence of ischemic heart disease with normal ejection fraction. Thus, the chance of malignant arrhythmia is very low. I requested a 48-hour Holter monitor. She tends to have elevated blood pressure but had a normal home blood pressure monitor recently. It might be worth to treat her with a beta blocker for symptomatic relief depending on the results of the Holter monitor.

## 2014-09-07 ENCOUNTER — Ambulatory Visit (INDEPENDENT_AMBULATORY_CARE_PROVIDER_SITE_OTHER): Payer: Medicare Other

## 2014-09-07 DIAGNOSIS — R002 Palpitations: Secondary | ICD-10-CM

## 2015-01-31 ENCOUNTER — Other Ambulatory Visit: Payer: Self-pay

## 2015-01-31 NOTE — Patient Outreach (Signed)
[place] [facility]) Care Management  01/31/2015  Diane Freeman 1945/07/08 [id number]   Telephone call to patient for Tier 2 referral.  No answer.    Plan: RN Health Coach will attempt patient within 1-2 weeks.  Jone Baseman, RN, MSN [place] [phone]

## 2015-01-31 NOTE — Patient Outreach (Signed)
[place] [facility]) Care Management  01/31/2015  Diane Freeman 11-May-1945 [id number]   Referral from [place] 2 List, assigned [name], RN to outreach for [place] Management services.  Thanks, [name]. [place], [place] Assistant Phone: [phone] Fax: [phone]

## 2015-02-07 ENCOUNTER — Other Ambulatory Visit: Payer: Self-pay

## 2015-02-07 NOTE — Patient Outreach (Signed)
[place] [facility]) Care Management  02/07/2015  JOCI DRESS 21-Jun-1945 [id number]   2nd Telephone call for Next Gen Tier 2 referral.  No answer.  HIPAA compliant voice message left.    Plan: RN Health Coach will attempt within 1-2 weeks.  Jone Baseman, RN, MSN [place] [phone]

## 2015-02-10 ENCOUNTER — Other Ambulatory Visit: Payer: Self-pay

## 2015-02-10 NOTE — Patient Outreach (Signed)
[place] [facility]) Care Management  02/10/2015  MAKAILA WINDLE 01/30/1946 [id number]   Notification from [name], RN to close case due to patient refused [place] Management services.  Thanks, [name]. [place], [place] Assistant Phone: [phone] Fax: [phone]

## 2015-02-10 NOTE — Patient Outreach (Signed)
[place] [facility]) Care Management  02/10/2015  CHEVON FOMBY 1945-07-31 [id number]  Referral Date: 01-31-15 Referral Source : Tier 2 Next Gen Referral Reason: CHF, 2 admits  Outreach Attempt: 3rd Telephone call. Spoke with patient.   Social: Patient lives alone but has friend who comes by.  Patient is independent with activities of daily living and still drives herself.    Conditions: Patient denies heart failure.  Denies problems with swelling, shortness of breath, or weight gain. She does report some problems previously with her palpitations and her blood pressure being elevated but states it resolved and she was not placed on any medications.  She states she checks her blood pressure periodically and states that it is normal.    Medications: Patient denies any medication issues at this time. She is able to afford her medications.    Services: Patient declines services at this time as she feels she is doing good.  However, agrees to receive letter and brochure.    Plan: RN Health Coach will send patient letter and brochure. RN Health Coach will send in basket to [place]line Del, Care Management Assistant for case closure.    Jone Baseman, RN, MSN [place] [phone]

## 2015-03-02 ENCOUNTER — Other Ambulatory Visit: Payer: Self-pay | Admitting: *Deleted

## 2015-03-02 MED ORDER — OMEPRAZOLE 20 MG PO CPDR: 20.0000 mg | DELAYED_RELEASE_CAPSULE | Freq: Every morning | ORAL | Status: DC

## 2015-04-27 ENCOUNTER — Encounter: Payer: Self-pay | Admitting: Gastroenterology

## 2015-04-27 ENCOUNTER — Ambulatory Visit (INDEPENDENT_AMBULATORY_CARE_PROVIDER_SITE_OTHER): Payer: Medicare Other | Admitting: Gastroenterology

## 2015-04-27 VITALS — BP 152/108 | HR 72 | Ht 62.25 in | Wt 144.5 lb

## 2015-04-27 DIAGNOSIS — Z86010 Personal history of colonic polyps: Secondary | ICD-10-CM | POA: Diagnosis not present

## 2015-04-27 DIAGNOSIS — K50119 Crohn's disease of large intestine with unspecified complications: Secondary | ICD-10-CM | POA: Diagnosis not present

## 2015-04-27 MED ORDER — MESALAMINE ER 0.375 G PO CP24: ORAL_CAPSULE | ORAL | Status: DC

## 2015-04-27 MED ORDER — NA SULFATE-K SULFATE-MG SULF 17.5-3.13-1.6 GM/177ML PO SOLN: ORAL | Status: DC

## 2015-04-27 NOTE — Progress Notes (Signed)
HPI :  70 y/o female: diagnosed with Crohns colitis since 2007, here for a follow up visit. She is a new patient to me. She has been on Apriso  - written for 2 tabs TID, but she takes 4 tabs per day for the most part. She reports on this regimen she has been doing well. She is having a bowel movement about 2 times per day. No blood in the stools. No abdominal pains or cramping. No weight loss. She thinks she has gained weight. She is eating well, no vomiting. Overall no complaints. She is otherwise tolerating medication well. She has been on on Lialda previously and the patient thinks it caused her hair to fall out. She thinks when she stopped Lialda the hair loss issue stopped. She has not had any hair loss with Apriso.   No FH of colon cancer. She denies any NSAIDs routinely. She takes aspirin rarely for headaches.   Her last colonoscopy was in 07/2012 - mild left sided colitis, poor prep. 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    Past Medical History  Diagnosis Date  . Colitis   . GERD (gastroesophageal reflux disease)   . Anemia   . Adrenal nodule ([place])   . Diverticulosis   . Hx of gastritis   . History of esophageal stricture   . Choledocholithiasis with obstruction   . Diabetes mellitus     pt denies  . Hiatal hernia   . Blood transfusion without reported diagnosis   . Cataract     bil removed  . Crohn's colitis ([place]) 07/11/12  . Fatty liver      Past Surgical History  Procedure Laterality Date  . Cholecystectomy  2009  . Tubal ligation    . Cataract extraction    . Ercp  06/02/2011    Procedure: ENDOSCOPIC RETROGRADE CHOLANGIOPANCREATOGRAPHY (ERCP);  Surgeon: Gatha Mayer, MD;  Location: [facility];  Service: Endoscopy;  Laterality: N/A;  . Colonoscopy    . Upper gastrointestinal endoscopy    . Cardiac catheterization  05/2013  . Left and right heart catheterization with  coronary angiogram N/A 06/15/2013    Procedure: LEFT AND RIGHT HEART CATHETERIZATION WITH CORONARY ANGIOGRAM- Shunt series;  Surgeon: Blane Ohara, MD;  Location: [facility] CATH LAB;  Service: Cardiovascular;  Laterality: N/A;   Family History  Problem Relation Age of Onset  . Bone cancer Maternal Grandmother   . Colon cancer Maternal Aunt 10  . Stomach cancer Sister 49    decsd  . Breast cancer Sister   . Esophageal cancer Neg Hx   . Rectal cancer Neg Hx    Social History  Substance Use Topics  . Smoking status: Never Smoker   . Smokeless tobacco: Never Used  . Alcohol Use: No   Current Outpatient Prescriptions  Medication Sig Dispense Refill  . atorvastatin (LIPITOR) 40 MG tablet Take 1 tablet (40 mg total) by mouth daily. 90 tablet 3  . IRON PO Take 1 tablet by mouth daily.    . mesalamine (APRISO) 0.375 g 24 hr capsule Take 2 tablets by mouth twice a day 120 capsule 6  . omeprazole (PRILOSEC) 20 MG capsule Take 1 capsule (20 mg total) by mouth every morning. 90 capsule 0  . vitamin B-12 (CYANOCOBALAMIN) 1000 MCG tablet Take 1,000 mcg by mouth as needed.    Marland Kitchen VITAMIN D, CHOLECALCIFEROL, PO Take  1 tablet by mouth as needed.    . Na Sulfate-K Sulfate-Mg Sulf SOLN Take as directed per Colonoscopy. 354 mL 0   No current facility-administered medications for this visit.   No Known Allergies   Review of Systems: All systems reviewed and negative except where noted in HPI.   Lab Results  Component Value Date   WBC 7.3 08/04/2014   HGB 12.9 08/04/2014   HCT 38.5 08/04/2014   MCV 79.4 08/04/2014   PLT 210 08/04/2014    Lab Results  Component Value Date   CREATININE 0.86 08/19/2014   BUN 11 08/19/2014   NA 139 08/19/2014   K 3.9 08/19/2014   CL 103 08/19/2014   CO2 24 08/19/2014    Lab Results  Component Value Date   ALT 12 08/19/2014   AST 18 08/19/2014   ALKPHOS 94 08/19/2014   BILITOT 0.4 08/19/2014     Physical Exam: BP 152/108 mmHg  Pulse 72  Ht 5' 2.25"  (1.581 m)  Wt 144 lb 8 oz (65.545 kg)  BMI 26.22 kg/m2 Constitutional: Pleasant,well-developed, female in no acute distress. HEENT: Normocephalic and atraumatic. Conjunctivae are normal. No scleral icterus. Neck supple.  Cardiovascular: Normal rate, regular rhythm.  Pulmonary/chest: Effort normal and breath sounds normal. No wheezing, rales or rhonchi. Abdominal: Soft, nondistended, nontender. Bowel sounds active throughout. There are no masses palpable. No hepatomegaly. Extremities: no edema Lymphadenopathy: No cervical adenopathy noted. Neurological: Alert and oriented to person place and time. Skin: Skin is warm and dry. No rashes noted. Psychiatric: Normal mood and affect. Behavior is normal.   ASSESSMENT AND PLAN: 70 y/o female with history of Crohn's colitis since 2007. Managed with mesalamine monotherapy since that time and overall has done pretty well. She had hairloss with Lialda, thus switched to [place] and is tolerating it well. She should be taking 4 tabs per day (1.5gm) daily for maintenance dosing. Her renal function and CBC are normal. She has had a reported history of "pseudopolyps" noted on her exam in 2009. She had a follow up colonoscopy in 2014 which was a poor prep. In light of the poor prep in 2014 and history of "pseudopolyps", I offered her a surveillance exam at this time to rule out any dysplastic changes and assess for deep remission on her mesalamine. The indications, risks, and benefits of colonoscopy were explained to the patient in detail. Risks include but are not limited to bleeding, perforation, adverse reaction to medications, and cardiopulmonary compromise. Sequelae include but are not limited to the possibility of surgery, hospitalization, and mortality. The patient verbalized understanding and wished to proceed. All questions answered, referred to the scheduler and bowel prep ordered. Further recommendations pending results of the exam. I otherwise recommend she  avoid NSAIDs if possible and use tylenol for her headaches as needed.   [place] Cellar, MD [facility] Gastroenterology Pager [phone]

## 2015-04-27 NOTE — Patient Instructions (Signed)
You have been scheduled for a colonoscopy. Please follow written instructions given to you at your visit today.  Please pick up your prep supplies at the pharmacy within the next 1-3 days. If you use inhalers (even only as needed), please bring them with you on the day of your procedure. Your physician has requested that you go to www.startemmi.com and enter the access code given to you at your visit today. This web site gives a general overview about your procedure. However, you should still follow specific instructions given to you by our office regarding your preparation for the procedure.  We have sent medications to your pharmacy for you to pick up at your convenience.

## 2015-04-29 ENCOUNTER — Encounter: Payer: Self-pay | Admitting: Gastroenterology

## 2015-04-29 ENCOUNTER — Ambulatory Visit (AMBULATORY_SURGERY_CENTER): Payer: Medicare Other | Admitting: Gastroenterology

## 2015-04-29 VITALS — BP 113/75 | HR 39 | Temp 96.9°F | Resp 20 | Ht 62.25 in | Wt 144.0 lb

## 2015-04-29 DIAGNOSIS — E119 Type 2 diabetes mellitus without complications: Secondary | ICD-10-CM | POA: Diagnosis not present

## 2015-04-29 DIAGNOSIS — Z1211 Encounter for screening for malignant neoplasm of colon: Secondary | ICD-10-CM

## 2015-04-29 DIAGNOSIS — K5090 Crohn's disease, unspecified, without complications: Secondary | ICD-10-CM | POA: Diagnosis not present

## 2015-04-29 DIAGNOSIS — K5010 Crohn's disease of large intestine without complications: Secondary | ICD-10-CM

## 2015-04-29 DIAGNOSIS — D123 Benign neoplasm of transverse colon: Secondary | ICD-10-CM

## 2015-04-29 DIAGNOSIS — K5150 Left sided colitis without complications: Secondary | ICD-10-CM | POA: Diagnosis not present

## 2015-04-29 DIAGNOSIS — D649 Anemia, unspecified: Secondary | ICD-10-CM | POA: Diagnosis not present

## 2015-04-29 DIAGNOSIS — K529 Noninfective gastroenteritis and colitis, unspecified: Secondary | ICD-10-CM | POA: Diagnosis not present

## 2015-04-29 MED ORDER — SODIUM CHLORIDE 0.9 % IV SOLN: 500.0000 mL | INTRAVENOUS | Status: DC

## 2015-04-29 NOTE — Progress Notes (Signed)
Called to room to assist during endoscopic procedure.  Patient ID and intended procedure confirmed with present staff. Received instructions for my participation in the procedure from the performing physician.  

## 2015-04-29 NOTE — Progress Notes (Signed)
To recovery, report given to Nicki Reaper, Therapist, sports, VSS

## 2015-04-29 NOTE — Op Note (Signed)
[facility]  [address] [place], [id number]   COLONOSCOPY PROCEDURE REPORT  PATIENT: Diane Freeman, Diane Freeman  MR#: [id number] BIRTHDATE: [birth year] , 58  yrs. old GENDER: female ENDOSCOPIST: Yetta Flock, MD REFERRED BY: PROCEDURE DATE:  04/29/2015 PROCEDURE:   Colonoscopy, surveillance , Colonoscopy with snare polypectomy, and Colonoscopy with biopsy First Screening Colonoscopy - Avg.  risk and is 50 yrs.  old or older - No.  Prior Negative Screening - Now for repeat screening. N/A  History of Adenoma - Now for follow-up colonoscopy & has been > or = to 3 yrs.  N/A  Polyps removed today? Yes ASA CLASS:   Class II INDICATIONS:history of chronic colitis, here for surveillance colonoscopy and high risk patient with previously diagnosed Crohn's Disease. MEDICATIONS: Propofol 270 mg IV  DESCRIPTION OF PROCEDURE:   After the risks benefits and alternatives of the procedure were thoroughly explained, informed consent was obtained.  The digital rectal exam revealed no abnormalities of the rectum.   The LB [id number]-[id number]  endoscope was introduced through the anus and advanced to the terminal ileum which was intubated for a short distance. No adverse events experienced.   The quality of the prep was fair.  The instrument was then slowly withdrawn as the colon was fully examined. Estimated blood loss is zero unless otherwise noted in this procedure report.      COLON FINDINGS: The bowel preparation was fair but following lavage adequate views were obtained in most areas.  The dependant portion of the ascending colon was difficult to visualize due to residual stool that clogged the endoscope.  The terminal ileum was normal.' Mild patchy erythema without ulceration was noted in the sigmoid and descending colon.  The background mucosa elsewhere appeared normal.  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   Surveillance biopsies were taken every 10cm throughout the colon, with several pseudopolyps biopsied.  One polyp in particular, roughly 72m pedunculated polyp was noted in the transverse colon and appeared potentially adenomatous.  This was removed via hot snare.  Mild diverticulosis was noted in the left and right colon.  Retroflexed views revealed no abnormalities. The time to cecum = 3.2 Withdrawal time = 22.8   The scope was withdrawn and the procedure completed. COMPLICATIONS: There were no immediate complications.  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

## 2015-04-29 NOTE — Patient Instructions (Signed)
YOU HAD AN ENDOSCOPIC PROCEDURE TODAY AT [facility]:   Refer to the procedure report that was given to you for any specific questions about what was found during the examination.  If the procedure report does not answer your questions, please call your gastroenterologist to clarify.  If you requested that your care partner not be given the details of your procedure findings, then the procedure report has been included in a sealed envelope for you to review at your convenience later.  YOU SHOULD EXPECT: Some feelings of bloating in the abdomen. Passage of more gas than usual.  Walking can help get rid of the air that was put into your GI tract during the procedure and reduce the bloating. If you had a lower endoscopy (such as a colonoscopy or flexible sigmoidoscopy) you may notice spotting of blood in your stool or on the toilet paper. If you underwent a bowel prep for your procedure, you may not have a normal bowel movement for a few days.  Please Note:  You might notice some irritation and congestion in your nose or some drainage.  This is from the oxygen used during your procedure.  There is no need for concern and it should clear up in a day or so.  SYMPTOMS TO REPORT IMMEDIATELY:   Following lower endoscopy (colonoscopy or flexible sigmoidoscopy):  Excessive amounts of blood in the stool  Significant tenderness or worsening of abdominal pains  Swelling of the abdomen that is new, acute  Fever of 100F or higher  For urgent or emergent issues, a gastroenterologist can be reached at any hour by calling [phone].   DIET: Your first meal following the procedure should be a small meal and then it is ok to progress to your normal diet. Heavy or fried foods are harder to digest and may make you feel nauseous or bloated.  Likewise, meals heavy in dairy and vegetables can increase bloating.  Drink plenty of fluids but you should avoid alcoholic beverages for 24  hours.  ACTIVITY:  You should plan to take it easy for the rest of today and you should NOT DRIVE or use heavy machinery until tomorrow (because of the sedation medicines used during the test).    FOLLOW UP: Our staff will call the number listed on your records the next business day following your procedure to check on you and address any questions or concerns that you may have regarding the information given to you following your procedure. If we do not reach you, we will leave a message.  However, if you are feeling well and you are not experiencing any problems, there is no need to return our call.  We will assume that you have returned to your regular daily activities without incident.  If any biopsies were taken you will be contacted by phone or by letter within the next 1-3 weeks.  Please call us at [phone] if you have not heard about the biopsies in 3 weeks.    SIGNATURES/CONFIDENTIALITY: You and/or your care partner have signed paperwork which will be entered into your electronic medical record.  These signatures attest to the fact that that the information above on your After Visit Summary has been reviewed and is understood.  Full responsibility of the confidentiality of this discharge information lies with you and/or your care-partner.  Polyp information given. NO NONSTEROIDAL ANTIIFLAMITORY MEDS.

## 2015-05-02 ENCOUNTER — Telehealth: Payer: Self-pay | Admitting: *Deleted

## 2015-05-02 NOTE — Telephone Encounter (Signed)
  Follow up Call-  Call back number 04/29/2015  Post procedure Call Back phone  # [phone]  Permission to leave phone message Yes     Patient questions:  Do you have a fever, pain , or abdominal swelling? No. Pain Score  0 *  Have you tolerated food without any problems? Yes.    Have you been able to return to your normal activities? Yes.    Do you have any questions about your discharge instructions: Diet   No. Medications  No. Follow up visit  No.  Do you have questions or concerns about your Care? No.  Actions: * If pain score is 4 or above: No action needed, pain <4.

## 2015-06-02 ENCOUNTER — Other Ambulatory Visit: Payer: Self-pay | Admitting: Gastroenterology

## 2015-06-15 ENCOUNTER — Other Ambulatory Visit (INDEPENDENT_AMBULATORY_CARE_PROVIDER_SITE_OTHER): Payer: Medicare Other

## 2015-06-15 ENCOUNTER — Encounter: Payer: Self-pay | Admitting: Gastroenterology

## 2015-06-15 ENCOUNTER — Ambulatory Visit (INDEPENDENT_AMBULATORY_CARE_PROVIDER_SITE_OTHER): Payer: Medicare Other | Admitting: Gastroenterology

## 2015-06-15 VITALS — BP 138/80 | HR 80 | Ht 63.0 in | Wt 149.0 lb

## 2015-06-15 DIAGNOSIS — Z86010 Personal history of colonic polyps: Secondary | ICD-10-CM | POA: Diagnosis not present

## 2015-06-15 DIAGNOSIS — K50119 Crohn's disease of large intestine with unspecified complications: Secondary | ICD-10-CM

## 2015-06-15 LAB — CBC WITH DIFFERENTIAL/PLATELET
BASOS PCT: 0.5 % (ref 0.0–3.0)
Basophils Absolute: 0 10*3/uL (ref 0.0–0.1)
EOS PCT: 3.4 % (ref 0.0–5.0)
Eosinophils Absolute: 0.3 10*3/uL (ref 0.0–0.7)
HCT: 39.3 % (ref 36.0–46.0)
Hemoglobin: 13.7 g/dL (ref 12.0–15.0)
LYMPHS ABS: 3.3 10*3/uL (ref 0.7–4.0)
Lymphocytes Relative: 38.4 % (ref 12.0–46.0)
MCHC: 34.8 g/dL (ref 30.0–36.0)
MCV: 87.2 fl (ref 78.0–100.0)
MONOS PCT: 9 % (ref 3.0–12.0)
Monocytes Absolute: 0.8 10*3/uL (ref 0.1–1.0)
NEUTROS ABS: 4.1 10*3/uL (ref 1.4–7.7)
NEUTROS PCT: 48.7 % (ref 43.0–77.0)
PLATELETS: 218 10*3/uL (ref 150.0–400.0)
RBC: 4.5 Mil/uL (ref 3.87–5.11)
RDW: 14.2 % (ref 11.5–15.5)
WBC: 8.5 10*3/uL (ref 4.0–10.5)

## 2015-06-15 LAB — COMPREHENSIVE METABOLIC PANEL
ALK PHOS: 119 U/L — AB (ref 39–117)
ALT: 28 U/L (ref 0–35)
AST: 25 U/L (ref 0–37)
Albumin: 3.9 g/dL (ref 3.5–5.2)
BUN: 11 mg/dL (ref 6–23)
CHLORIDE: 107 meq/L (ref 96–112)
CO2: 26 mEq/L (ref 19–32)
Calcium: 9.3 mg/dL (ref 8.4–10.5)
Creatinine, Ser: 0.95 mg/dL (ref 0.40–1.20)
GFR: 61.63 mL/min (ref >60.00)
GLUCOSE: 112 mg/dL — AB (ref 70–99)
POTASSIUM: 4.6 meq/L (ref 3.5–5.1)
Sodium: 139 mEq/L (ref 135–145)
TOTAL PROTEIN: 7.5 g/dL (ref 6.0–8.3)
Total Bilirubin: 0.4 mg/dL (ref 0.2–1.2)

## 2015-06-15 LAB — C-REACTIVE PROTEIN: CRP: 0.1 mg/dL — ABNORMAL LOW (ref 0.5–20.0)

## 2015-06-15 LAB — VITAMIN D 25 HYDROXY (VIT D DEFICIENCY, FRACTURES): VITD: 19.6 ng/mL — ABNORMAL LOW (ref 30.00–100.00)

## 2015-06-15 NOTE — Patient Instructions (Signed)
Your physician has requested that you go to the basement for lab work before leaving today.  Please follow up with Dr. Havery Moros in 6 months.

## 2015-06-15 NOTE — Progress Notes (Signed)
HPI :  LAST VISIT: 70 y/o female: diagnosed with Crohns colitis since 2007, here for a follow up visit. She is a new patient to me. She has been on Apriso - written for 2 tabs TID, but she takes 4 tabs per day for the most part. She reports on this regimen she has been doing well. She is having a bowel movement about 2 times per day. No blood in the stools. No abdominal pains or cramping. No weight loss. She thinks she has gained weight. She is eating well, no vomiting. Overall no complaints. She is otherwise tolerating medication well. She has been on on Lialda previously and the patient thinks it caused her hair to fall out. She thinks when she stopped Lialda the hair loss issue stopped. She has not had any hair loss with Apriso.   No FH of colon cancer. She denies any NSAIDs routinely. She takes aspirin rarely for headaches.   Her last colonoscopy was in 07/2012 - mild left sided colitis, poor prep. 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  SINCE LAST VISIT:  Patient is feeling well. She had a colonoscopy in January with report as outlined below. She is having about 2-3 BMs per day. No blood in the stools. No abdominal pains. No weight loss, she has gained weight. She is tolerating Apriso and taking it every day. She generally has no complaints today and feels well.  She is up to date with her pneumo vac. She declines a flu shot. Due for vitamin D check.   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 Colitis   . GERD (gastroesophageal reflux disease)   . Anemia   . Adrenal nodule ([facility]   .  Diverticulosis   . Hx of gastritis   . History of esophageal stricture   . Choledocholithiasis with obstruction   . Diabetes mellitus     pt denies  . Hiatal hernia   . Blood transfusion without reported diagnosis   . Cataract     bil removed  . Crohn's colitis ([facility] 07/11/12  . Fatty liver      Past Surgical History  Procedure Laterality Date  . Cholecystectomy  2009  . Tubal ligation    . Cataract extraction    . Ercp  06/02/2011    Procedure: ENDOSCOPIC RETROGRADE CHOLANGIOPANCREATOGRAPHY (ERCP);  Surgeon: CGatha Mayer MD;  [facility];  Service: Endoscopy;  Laterality: N/A;  . Colonoscopy    . Upper gastrointestinal endoscopy    . Cardiac catheterization  05/2013  . Left and right heart catheterization with coronary angiogram N/A 06/15/2013    Procedure: LEFT AND RIGHT HEART CATHETERIZATION WITH CORONARY ANGIOGRAM- Shunt series;  Surgeon: MBlane Ohara MD;  [facility];  Service: Cardiovascular;  Laterality: N/A;   Family History  Problem Relation Age of Onset  . Bone cancer Maternal Grandmother   . Colon cancer Maternal Aunt 661 . Stomach cancer Sister 659   decsd  . Breast cancer Sister   . Esophageal cancer Neg Hx   . Rectal cancer Neg Hx  Social History  Substance Use Topics  . Smoking status: Never Smoker   . Smokeless tobacco: Never Used  . Alcohol Use: No   Current Outpatient Prescriptions  Medication Sig Dispense Refill  . atorvastatin (LIPITOR) 40 MG tablet Take 1 tablet (40 mg total) by mouth daily. 90 tablet 3  . IRON PO Take 1 tablet by mouth daily.    . mesalamine (APRISO) 0.375 g 24 hr capsule Take 2 tablets by mouth twice a day 120 capsule 6  . omeprazole (PRILOSEC) 20 MG capsule TAKE 1 CAPSULE (20 MG TOTAL) BY MOUTH EVERY MORNING. 90 capsule 0  . vitamin B-12 (CYANOCOBALAMIN) 1000 MCG tablet Take 1,000 mcg by mouth as needed.    Marland Kitchen VITAMIN D, CHOLECALCIFEROL, PO Take 1 tablet by mouth as needed.     No current  facility-administered medications for this visit.   No Known Allergies   Review of Systems: All systems reviewed and negative except where noted in HPI.   Lab Results  Component Value Date   WBC 8.5 06/15/2015   HGB 13.7 06/15/2015   HCT 39.3 06/15/2015   MCV 87.2 06/15/2015   PLT 218.0 06/15/2015   Lab Results  Component Value Date   CREATININE 0.95 06/15/2015   BUN 11 06/15/2015   NA 139 06/15/2015   K 4.6 06/15/2015   CL 107 06/15/2015   CO2 26 06/15/2015    Lab Results  Component Value Date   ALT 28 06/15/2015   AST 25 06/15/2015   ALKPHOS 119* 06/15/2015   BILITOT 0.4 06/15/2015   Lab Results  Component Value Date   CRP 0.1* 06/15/2015     Physical Exam: BP 138/80 mmHg  Pulse 80  Ht 5' 3"  (1.6 m)  Wt 149 lb (67.586 kg)  BMI 26.40 kg/m2 Constitutional: Pleasant,well-developed, female in no acute distress. HEENT: Normocephalic and atraumatic. Conjunctivae are normal. No scleral icterus. Neck supple.  Cardiovascular: Normal rate, regular rhythm.  Pulmonary/chest: Effort normal and breath sounds normal. No wheezing, rales or rhonchi. Abdominal: Soft, nondistended, nontender. Bowel sounds active throughout. There are no masses palpable. No hepatomegaly. Extremities: no edema Lymphadenopathy: No cervical adenopathy noted. Neurological: Alert and oriented to person place and time. Skin: Skin is warm and dry. No rashes noted. Psychiatric: Normal mood and affect. Behavior is normal.   ASSESSMENT AND PLAN: 70 y/o female with history of Crohn's colitis since 2007. Managed with mesalamine monotherapy since that time and overall has done pretty well in regards to symptoms being fairly well controlled. She had hairloss with Lialda, thus switched to [place] and is tolerating it well. Her last surveillance colonoscopy in January showed only mild inflammation in the left colon mostly, but extensive pseudopolyposis. One adenoma was removed. I explained my concern to the  patient about her pseudopolyposis, and how this increases her risk for colon cancer as it is extremely difficult to survey her colon for adenomas in this setting. Options moving forward are to continue her mesalamine for maintenance therapy and survey her colon at least yearly with Colonoscopy, consideration for escalation of her IBD maintenance therapy as she has mild active inflammation, or consider colectomy if she wanted to minimize her risk of colon cancer. I discussed all options with her, risks / benefits of each. I think continuing her regimen for now with yearly surveillance colonoscopy is reasonable. She is not willing to accept the risks of biologic therapy or immunomodulator therapy given she feels well symptomatically. I repeated her labs today and she has no  anemia, CRP is normal. AP is mildly elevated and would repeat in a few months to see if this persist (just over the limit of normal). Colectomy is too aggressive for her right now in discussion of this option. She agreed with the plan, I answered all of her questions, and her sister was present today to be part of the discussion. I will see her in 6 months for reassessment. She declines flu shot, UTD on pneumovacc. Will check vitamin D to ensure normal.   Culebra Cellar, MD [facility] Pager [phone]

## 2015-06-16 ENCOUNTER — Other Ambulatory Visit: Payer: Self-pay | Admitting: *Deleted

## 2015-06-16 DIAGNOSIS — R748 Abnormal levels of other serum enzymes: Secondary | ICD-10-CM

## 2015-06-16 MED ORDER — VITAMIN D (ERGOCALCIFEROL) 1.25 MG (50000 UNIT) PO CAPS: ORAL_CAPSULE | ORAL | Status: DC

## 2015-08-01 ENCOUNTER — Other Ambulatory Visit: Payer: Self-pay | Admitting: *Deleted

## 2015-08-01 DIAGNOSIS — E785 Hyperlipidemia, unspecified: Secondary | ICD-10-CM

## 2015-08-01 MED ORDER — ATORVASTATIN CALCIUM 40 MG PO TABS: 40.0000 mg | ORAL_TABLET | Freq: Every day | ORAL | Status: DC

## 2015-08-01 NOTE — Telephone Encounter (Signed)
Rx filled.  Algis Greenhouse. Jerline Pain, [place] Resident PGY-2 08/01/2015 10:58 AM

## 2015-08-11 ENCOUNTER — Telehealth: Payer: Self-pay | Admitting: *Deleted

## 2015-08-11 NOTE — Telephone Encounter (Signed)
Patient will come for labs.  

## 2015-08-11 NOTE — Telephone Encounter (Signed)
-----   Message from Hulan Saas, RN sent at 06/16/2015  9:33 AM EDT ----- Call and remind patient due for CMET for SA on 08/15/15. Lab in.

## 2015-08-12 ENCOUNTER — Other Ambulatory Visit: Payer: Self-pay | Admitting: Gastroenterology

## 2015-08-25 ENCOUNTER — Other Ambulatory Visit (INDEPENDENT_AMBULATORY_CARE_PROVIDER_SITE_OTHER): Payer: Medicare Other

## 2015-08-25 DIAGNOSIS — R748 Abnormal levels of other serum enzymes: Secondary | ICD-10-CM | POA: Diagnosis not present

## 2015-08-25 LAB — COMPREHENSIVE METABOLIC PANEL
ALBUMIN: 4.1 g/dL (ref 3.5–5.2)
ALK PHOS: 100 U/L (ref 39–117)
ALT: 24 U/L (ref 0–35)
AST: 22 U/L (ref 0–37)
BILIRUBIN TOTAL: 0.5 mg/dL (ref 0.2–1.2)
BUN: 12 mg/dL (ref 6–23)
CO2: 26 mEq/L (ref 19–32)
Calcium: 9.5 mg/dL (ref 8.4–10.5)
Chloride: 106 mEq/L (ref 96–112)
Creatinine, Ser: 0.92 mg/dL (ref 0.40–1.20)
GFR: 63.92 mL/min (ref >60.00)
GLUCOSE: 111 mg/dL — AB (ref 70–99)
POTASSIUM: 4.1 meq/L (ref 3.5–5.1)
SODIUM: 138 meq/L (ref 135–145)
TOTAL PROTEIN: 7.5 g/dL (ref 6.0–8.3)

## 2015-08-29 ENCOUNTER — Other Ambulatory Visit: Payer: Self-pay | Admitting: Gastroenterology

## 2015-10-12 ENCOUNTER — Ambulatory Visit (INDEPENDENT_AMBULATORY_CARE_PROVIDER_SITE_OTHER): Payer: Medicare Other | Admitting: Family Medicine

## 2015-10-12 VITALS — BP 184/79 | HR 71 | Temp 97.9°F | Wt 152.8 lb

## 2015-10-12 DIAGNOSIS — M545 Low back pain, unspecified: Secondary | ICD-10-CM

## 2015-10-12 DIAGNOSIS — M47818 Spondylosis without myelopathy or radiculopathy, sacral and sacrococcygeal region: Secondary | ICD-10-CM

## 2015-10-12 DIAGNOSIS — M4698 Unspecified inflammatory spondylopathy, sacral and sacrococcygeal region: Secondary | ICD-10-CM | POA: Diagnosis not present

## 2015-10-12 NOTE — Progress Notes (Signed)
   Subjective: CC: right lower back pain [facility] Diane Freeman is a 70 y.o. female presenting to clinic today for same day appointment. PCP: Dimas Chyle, MD Concerns today include:  1. Right lower back pain Patient notes that back pain started about 2 weeks ago.  She reports that the pain feels like a catching sensation when she gets up.  She notes that after she moves around a bit pain improves.  She reports that after about 5 minutes pain goes away.  Has not used any medications to help.  No topicals or heat/ice.  No preceding injury.  She notes that she has a h/o Crohn's and was told that she may need her colon reconstructed.  She wonders if pain is from her colon.  No weight loss, hematochezia, melena, fevers, chills, nausea or vomiting.  No CP or SOB.  No LE weakness or numbness.  No urinary retention or fecal incontinence.   Social History Reviewed: non smoker. FamHx and MedHx reviewed.  Please see EMR.  ROS: Per HPI  Objective: Office vital signs reviewed. BP 184/79 mmHg  Pulse 71  Temp(Src) 97.9 F (36.6 C) (Oral)  Wt 152 lb 12.8 oz (69.31 kg)  SpO2 98%  Physical Examination:  General: Awake, alert, well nourished, No acute distress HEENT: Normal, sclera white MSK: Normal gait and station, patient has full painless AROM of the lumbar spine and hips.  No erythema, edema or ecchymosis appreciated.  No palpable bony abnormalities in the lumbar spine.  No midline TTP to spine.  + TTP to SI joint on right.  No TTP to hip or greater trochanter.  Negative straight leg raise.  Negative FADIR, Negative FABER. Neuro: LE strength and sensation grossly intact, patellar DTRs 1/4 bilaterally  Assessment/ Plan: 71 y.o. 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  Diane suspect that her LBP is from this as she was TTP along the SI joint.  No apparent lumbar spine or hip pathology on exam.  No neurologic deficits or red flags on exam.  We discussed the different options available, including referral  for PT, referral to sports medicine, referral to ortho for possible joint injection.  Though, Diane recommended proceeding with conservative management first, since she had not tried anything yet.  She was very amenable to this and did not desire specialist care at this time. - Recommended Tylenol prn pain, max 2 grams per day. - Home exercises for low back strengthening provided (sports med advisor handout) - Could consider imaging of low back and hip if persistent or worsening pain to further evaluate - Consider referral to ortho if persistent pain and patient wishes to proceed with steroid injection - Return precautions reviewed - Follow up with PCP prn  2. Right-sided low back pain without sciatica - as above  Additionally, elevated BP noted in office.  Discussed with patient during appt and she notes that this is a reoccurring issue when she comes to doctor's offices.  No red flag signs.  Unfortunately, not rechecked prior to leaving.   Janora Norlander, DO PGY-3, [facility]

## 2015-10-12 NOTE — Patient Instructions (Signed)
I suspect that you are having arthritis in the SI joint.  I recommend that you start taking Tylenol daily to help with this pain.  I have provided some exercises to help strengthen and stretch.  If you do not see improvement, please consider seeing the orthopedic office as we discussed.  The may be able to do the injection described below.  Sacroiliac (SI) Joint Injection Patient Information  Description: The sacroiliac joint connects the scrum (very low back and tailbone) to the ilium (a pelvic bone which also forms half of the hip joint).  Normally this joint experiences very little motion.  When this joint becomes inflamed or unstable low back and or hip and pelvis pain may result.  Injection of this joint with local anesthetics (numbing medicines) and steroids can provide diagnostic information and reduce pain.  This injection is performed with the aid of x-ray guidance into the tailbone area while you are lying on your stomach.   You may experience an electrical sensation down the leg while this is being done.  You may also experience numbness.  We also may ask if we are reproducing your normal pain during the injection.  Conditions which may be treated SI injection:   Low back, buttock, hip or leg pain  Preparation for the Injection:  1. Do not eat any solid food or dairy products within 8 hours of your appointment.  2. You may drink clear liquids up to 3 hours before appointment.  Clear liquids include water, black coffee, juice or soda.  No milk or cream please. 3. You may take your regular medications, including pain medications with a sip of water before your appointment.  Diabetics should hold regular insulin (if take separately) and take 1/2 normal NPH dose the morning of the procedure.  Carry some sugar containing items with you to your appointment. 4. A driver must accompany you and be prepared to drive you home after your procedure. 5. Bring all of your current medications with  you. 6. An IV may be inserted and sedation may be given at the discretion of the physician. 7. A blood pressure cuff, EKG and other monitors will often be applied during the procedure.  Some patients may need to have extra oxygen administered for a short period.  8. You will be asked to provide medical information, including your allergies, prior to the procedure.  We must know immediately if you are taking blood thinners (like Coumadin/Warfarin) or if you are allergic to IV iodine contrast (dye).  We must know if you could possible be pregnant.  Possible side effects:   Bleeding from needle site  Infection (rare, may require surgery)  Nerve injury (rare)  Numbness & tingling (temporary)  A brief convulsion or seizure  Light-headedness (temporary)  Pain at injection site (several days)  Decreased blood pressure (temporary)  Weakness in the leg (temporary)   Call if you experience:   New onset weakness or numbness of an extremity below the injection site that last more than 8 hours.  Hives or difficulty breathing ( go to the emergency room)  Inflammation or drainage at the injection site  Any new symptoms which are concerning to you  Please note:  Although the local anesthetic injected can often make your back/ hip/ buttock/ leg feel good for several hours after the injections, the pain will likely return.  It takes 3-7 days for steroids to work in the sacroiliac area.  You may not notice any pain relief for at  least that one week.  If effective, we will often do a series of three injections spaced 3-6 weeks apart to maximally decrease your pain.  After the initial series, we generally will wait some months before a repeat injection of the same type.  If you have any questions, please call [phone] [facility]

## 2015-10-15 ENCOUNTER — Other Ambulatory Visit: Payer: Self-pay | Admitting: Gastroenterology

## 2015-10-20 ENCOUNTER — Encounter: Payer: Self-pay | Admitting: Gastroenterology

## 2015-10-20 ENCOUNTER — Other Ambulatory Visit: Payer: Self-pay | Admitting: Gastroenterology

## 2015-11-02 ENCOUNTER — Other Ambulatory Visit: Payer: Self-pay | Admitting: Gastroenterology

## 2015-11-19 ENCOUNTER — Other Ambulatory Visit: Payer: Self-pay | Admitting: Gastroenterology

## 2015-12-11 ENCOUNTER — Other Ambulatory Visit: Payer: Self-pay | Admitting: Gastroenterology

## 2015-12-22 ENCOUNTER — Ambulatory Visit (INDEPENDENT_AMBULATORY_CARE_PROVIDER_SITE_OTHER): Payer: Medicare Other | Admitting: Gastroenterology

## 2015-12-22 ENCOUNTER — Encounter: Payer: Self-pay | Admitting: Gastroenterology

## 2015-12-22 VITALS — BP 120/82 | HR 72 | Ht 62.0 in | Wt 155.0 lb

## 2015-12-22 DIAGNOSIS — K5010 Crohn's disease of large intestine without complications: Secondary | ICD-10-CM

## 2015-12-22 DIAGNOSIS — D126 Benign neoplasm of colon, unspecified: Secondary | ICD-10-CM

## 2015-12-22 MED ORDER — MESALAMINE 4 G RE ENEM: ENEMA | RECTAL | 3 refills | Status: DC

## 2015-12-22 NOTE — Patient Instructions (Signed)
You will be due for a recall colonoscopy in 04/2016. We will send you a reminder in the mail when it gets closer to that time.  We have sent the following medications to your pharmacy for you to pick up at your convenience: rowasa enema-insert 1 enema into rectum every night x 2 weeks, then use as needed thereafter.  Please purchase the following medications over the counter and take as directed: Curcumin capsule- 3 grams (3,000 mg) per day  If you are age 66 or older, your body mass index should be between 23-30. Your Body mass index is 28.35 kg/m. If this is out of the aforementioned range listed, please consider follow up with your Primary Care Provider.  If you are age 46 or younger, your body mass index should be between 19-25. Your Body mass index is 28.35 kg/m. If this is out of the aformentioned range listed, please consider follow up with your Primary Care Provider.

## 2015-12-22 NOTE — Progress Notes (Signed)
HPI :  IBD HISTORY: 70 y/o female: diagnosed with Crohns colitis since 2007. She has been on Apriso 1.5gm / day. She has been on on Lialda previously and the patient thinks it caused her hair to fall out. She thinks when she stopped Lialda the hair loss issue stopped. She has not had any hair loss with Apriso.   No FH of colon cancer. She denies any NSAIDs routinely.   SINCE LAST VISIT:  Patient is feeling well. She had a colonoscopy in January with report as outlined below. She is having about 3-5 BMs per day. No blood in the stools. No abdominal pains. No weight loss, she has gained weight. She is tolerating Apriso and taking it every day. She generally has no complaints today and feels at baseline. She was found to be vitamin D deficient since her last visit and taking supplementation.    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 Adrenal nodule ([facility]   . Anemia   . Blood transfusion without reported diagnosis   . Cataract    bil removed  . Choledocholithiasis with obstruction   . Colitis   . Crohn's colitis ([facility] 07/11/12  . Diabetes mellitus    pt denies  . Diverticulosis   . Fatty liver   . GERD (gastroesophageal reflux disease)   . Hiatal hernia   . History of esophageal stricture   . Hx of gastritis      Past Surgical History:  Procedure Laterality Date  . CARDIAC CATHETERIZATION  05/2013  . CATARACT EXTRACTION    . CHOLECYSTECTOMY  2009  . COLONOSCOPY    . ERCP  06/02/2011   Procedure: ENDOSCOPIC RETROGRADE CHOLANGIOPANCREATOGRAPHY (ERCP);  Surgeon: CGatha Mayer MD;  [facility];  Service: Endoscopy;  Laterality: N/A;  . LEFT AND RIGHT  HEART CATHETERIZATION WITH CORONARY ANGIOGRAM N/A 06/15/2013   Procedure: LEFT AND RIGHT HEART CATHETERIZATION WITH CORONARY ANGIOGRAM- Shunt series;  Surgeon: MBlane Ohara MD;  [facility];  Service: Cardiovascular;  Laterality: N/A;  . TUBAL LIGATION    . UPPER GASTROINTESTINAL ENDOSCOPY     Family History  Problem Relation Age of Onset  . Bone cancer Maternal Grandmother   . Colon cancer Maternal Aunt 64 . Stomach cancer Sister 626   decsd  . Breast cancer Sister   . Esophageal cancer Neg Hx   . Rectal cancer Neg Hx    Social History  Substance Use Topics  . Smoking status: Never Smoker  . Smokeless tobacco: Never Used  . Alcohol use No   Current Outpatient Prescriptions  Medication Sig Dispense Refill  . APRISO 0.375 g 24 hr capsule TAKE 2 CAPSULES BY MOUTH TWICE A DAY 120 capsule 6  . atorvastatin (LIPITOR) 40 MG tablet Take 1 tablet (40 mg total) by mouth daily. 90 tablet 3  . IRON PO Take 1 tablet by mouth daily.    .Marland Kitchenomeprazole (PRILOSEC) 20 MG capsule TAKE 1 CAPSULE (20 MG TOTAL) BY MOUTH EVERY MORNING. 90 capsule 0  . vitamin B-12 (CYANOCOBALAMIN) 1000 MCG tablet Take 1,000 mcg by mouth as needed.    .Marland KitchenVITAMIN D, CHOLECALCIFEROL, PO Take 1 tablet by mouth as needed.    .Marland Kitchen  Vitamin D, Ergocalciferol, (DRISDOL) 50000 units CAPS capsule TAKE 1 CAPSULE ONCE WEEKLY FOR 6 WEEKS 4 capsule 1   No current facility-administered medications for this visit.    No Known Allergies   Review of Systems: All systems reviewed and negative except where noted in HPI.   Lab Results  Component Value Date   WBC 8.5 06/15/2015   HGB 13.7 06/15/2015   HCT 39.3 06/15/2015   MCV 87.2 06/15/2015   PLT 218.0 06/15/2015    Lab Results  Component Value Date   ALT 24 08/25/2015   AST 22 08/25/2015   ALKPHOS 100 08/25/2015   BILITOT 0.5 08/25/2015    Lab Results  Component Value Date   CREATININE 0.92 08/25/2015   BUN 12 08/25/2015   NA 138 08/25/2015   K 4.1  08/25/2015   CL 106 08/25/2015   CO2 26 08/25/2015      Physical Exam: BP 120/82   Pulse 72   Ht 5' 2"  (1.575 m)   Wt 155 lb (70.3 kg)   BMI 28.35 kg/m  Constitutional: Pleasant,well-developed, female in no acute distress. HEENT: Normocephalic and atraumatic. Conjunctivae are normal. No scleral icterus. Neck supple.  Cardiovascular: Normal rate, regular rhythm.  Pulmonary/chest: Effort normal and breath sounds normal. No wheezing, rales or rhonchi. Abdominal: Soft, nondistended, nontender. Bowel sounds active throughout. There are no masses palpable. No hepatomegaly. Extremities: no edema Lymphadenopathy: No cervical adenopathy noted. Neurological: Alert and oriented to person place and time. Skin: Skin is warm and dry. No rashes noted. Psychiatric: Normal mood and affect. Behavior is normal.   ASSESSMENT AND PLAN: 70 y/o female with reported colitis since 2007, managed with mesalamine monotherapy over time with fair control of symptoms. Intolerant to [place], on Apriso now. Last colonoscopy with some mild active inflammation noted and pseudoinflammatory polyposis noted, along with one adenoma which was removed.   With her mild active inflammation on last colonoscopy and baseline symptoms, she's not in remission on mesalamine monotherapy. We discussed options moving forward to include escalation of care with anti-TNF, Entyvio, thiopurines, or methotrexate. I discussed risks / benefits of these, and she was not willing to accept the risks of these therapies given she feels fairly well at this time. We discussed her long term risk of colon cancer is elevated with chronic active colitis and pseudoinflammatory polyps, and warrants close surveillance colonoscopy yearly. To help reduce her symptoms I offered her Rowasa enemas in addition to oral mesalamine and she will see if this helps. I also discussed alternative therapies with her, specifically Curcumin, for which there is some limited  evidence that this can be a useful adjunct to mesalamine, dosed at 3gm / day. She wished to try adding Rowasa enemas and Curcumin supplementation prior to escalation of her therapy to biologics or immunomodulators at this time but will continue to engage her on her options. Recall colonoscopy in Jan 2018.   [place], MD [facility] Pager [phone]

## 2016-02-08 ENCOUNTER — Other Ambulatory Visit: Payer: Self-pay | Admitting: Gastroenterology

## 2016-02-22 ENCOUNTER — Encounter: Payer: Self-pay | Admitting: Gastroenterology

## 2016-02-27 ENCOUNTER — Encounter: Payer: Self-pay | Admitting: Gastroenterology

## 2016-03-08 ENCOUNTER — Other Ambulatory Visit: Payer: Self-pay

## 2016-04-12 ENCOUNTER — Ambulatory Visit (AMBULATORY_SURGERY_CENTER): Payer: Self-pay | Admitting: *Deleted

## 2016-04-12 VITALS — Ht 63.0 in | Wt 157.8 lb

## 2016-04-12 DIAGNOSIS — Z86010 Personal history of colonic polyps: Secondary | ICD-10-CM

## 2016-04-12 MED ORDER — NA SULFATE-K SULFATE-MG SULF 17.5-3.13-1.6 GM/177ML PO SOLN: 1.0000 | Freq: Once | ORAL | 0 refills | Status: AC

## 2016-04-12 NOTE — Progress Notes (Signed)
Denies allergies to eggs or soy products. Denies complications with sedation or anesthesia. Denies O2 use. Denies use of diet or weight loss medications.  Emmi instructions declined for colonoscopy.

## 2016-04-26 ENCOUNTER — Ambulatory Visit (AMBULATORY_SURGERY_CENTER): Payer: Medicare Other | Admitting: Gastroenterology

## 2016-04-26 ENCOUNTER — Encounter: Payer: Self-pay | Admitting: Gastroenterology

## 2016-04-26 VITALS — BP 144/73 | HR 67 | Temp 97.1°F | Resp 16 | Ht 63.0 in | Wt 157.0 lb

## 2016-04-26 DIAGNOSIS — D123 Benign neoplasm of transverse colon: Secondary | ICD-10-CM | POA: Diagnosis not present

## 2016-04-26 DIAGNOSIS — K5010 Crohn's disease of large intestine without complications: Secondary | ICD-10-CM

## 2016-04-26 DIAGNOSIS — Z86010 Personal history of colonic polyps: Secondary | ICD-10-CM

## 2016-04-26 DIAGNOSIS — K5140 Inflammatory polyps of colon without complications: Secondary | ICD-10-CM | POA: Diagnosis not present

## 2016-04-26 DIAGNOSIS — K529 Noninfective gastroenteritis and colitis, unspecified: Secondary | ICD-10-CM | POA: Diagnosis not present

## 2016-04-26 MED ORDER — SODIUM CHLORIDE 0.9 % IV SOLN: 500.0000 mL | INTRAVENOUS | Status: DC

## 2016-04-26 NOTE — Patient Instructions (Signed)
YOU HAD AN ENDOSCOPIC PROCEDURE TODAY AT [facility]:   Refer to the procedure report that was given to you for any specific questions about what was found during the examination.  If the procedure report does not answer your questions, please call your gastroenterologist to clarify.  If you requested that your care partner not be given the details of your procedure findings, then the procedure report has been included in a sealed envelope for you to review at your convenience later.  YOU SHOULD EXPECT: Some feelings of bloating in the abdomen. Passage of more gas than usual.  Walking can help get rid of the air that was put into your GI tract during the procedure and reduce the bloating. If you had a lower endoscopy (such as a colonoscopy or flexible sigmoidoscopy) you may notice spotting of blood in your stool or on the toilet paper. If you underwent a bowel prep for your procedure, you may not have a normal bowel movement for a few days.  Please Note:  You might notice some irritation and congestion in your nose or some drainage.  This is from the oxygen used during your procedure.  There is no need for concern and it should clear up in a day or so.  SYMPTOMS TO REPORT IMMEDIATELY:   Following lower endoscopy (colonoscopy or flexible sigmoidoscopy):  Excessive amounts of blood in the stool  Significant tenderness or worsening of abdominal pains  Swelling of the abdomen that is new, acute  Fever of 100F or higher   For urgent or emergent issues, a gastroenterologist can be reached at any hour by calling [phone]. Please read all handouts given to you by your recovery nurse.   DIET:  We do recommend a small meal at first, but then you may proceed to your regular diet.  Drink plenty of fluids but you should avoid alcoholic beverages for 24 hours.  ACTIVITY:  You should plan to take it easy for the rest of today and you should NOT DRIVE or use heavy machinery until  tomorrow (because of the sedation medicines used during the test).    FOLLOW UP: Our staff will call the number listed on your records the next business day following your procedure to check on you and address any questions or concerns that you may have regarding the information given to you following your procedure. If we do not reach you, we will leave a message.  However, if you are feeling well and you are not experiencing any problems, there is no need to return our call.  We will assume that you have returned to your regular daily activities without incident.  If any biopsies were taken you will be contacted by phone or by letter within the next 1-3 weeks.  Please call us at [phone] if you have not heard about the biopsies in 3 weeks.    SIGNATURES/CONFIDENTIALITY: You and/or your care partner have signed paperwork which will be entered into your electronic medical record.  These signatures attest to the fact that that the information above on your After Visit Summary has been reviewed and is understood.  Full responsibility of the confidentiality of this discharge information lies with you and/or your care-partner.  Thank you for letting us take care of your healthcare needs today.

## 2016-04-26 NOTE — Op Note (Signed)
[facility] Patient Name: Diane Freeman Procedure Date: 04/26/2016 11:21 AM MRN: [id number] Endoscopist: Remo Lipps P. Armbruster MD, MD Age: 71 Referring MD:  Date of Birth: 20-Dec-1945 Gender: Female Account #: [id number] Procedure:                Colonoscopy Indications:              High risk colon cancer surveillance: longstanding                            Crohn's disease large intestine, on Apriso and                            Rowasa Medicines:                Monitored Anesthesia Care Procedure:                Pre-Anesthesia Assessment:                           - Prior to the procedure, a History and Physical                            was performed, and patient medications and                            allergies were reviewed. The patient's tolerance of                            previous anesthesia was also reviewed. The risks                            and benefits of the procedure and the sedation                            options and risks were discussed with the patient.                            All questions were answered, and informed consent                            was obtained. Prior Anticoagulants: The patient has                            taken no previous anticoagulant or antiplatelet                            agents. ASA Grade Assessment: II - A patient with                            mild systemic disease. After reviewing the risks                            and benefits, the patient was deemed in  satisfactory condition to undergo the procedure.                           After obtaining informed consent, the colonoscope                            was passed under direct vision. Throughout the                            procedure, the patient's blood pressure, pulse, and                            oxygen saturations were monitored continuously. The                            Model CF-HQ190L [id number]) scope was  introduced                            through the anus and advanced to the the terminal                            ileum, with identification of the appendiceal                            orifice and IC valve. The colonoscopy was performed                            without difficulty. The patient tolerated the                            procedure well. The quality of the bowel                            preparation was good. The terminal ileum, ileocecal                            valve, appendiceal orifice, and rectum were                            photographed. Scope In: 11:22:59 AM Scope Out: 11:44:02 AM Scope Withdrawal Time: 0 hours 15 minutes 20 seconds  Total Procedure Duration: 0 hours 21 minutes 3 seconds  Findings:                 The perianal and digital rectal examinations were                            normal.                           A 6 mm polyp was found in the splenic flexure,                            thought to be adenomatous on appearance. The polyp  was sessile. The polyp was removed with a cold                            snare. Resection and retrieval were complete.                           Scattered pseudopolyps of a variety of sizes were                            found in the transverse colon. Random biopsies were                            taken to rule out adenomatous change                           The terminal ileum appeared normal.                           Multiple medium-mouthed diverticula were found in                            the left colon.                           Internal hemorrhoids were found during retroflexion.                           The exam was otherwise without abnormality. No                            active inflammation was appreciated.                           Four biopsies were taken every 10 cm with a cold                            forceps for Crohn's disease surveillance. These                             biopsy specimens were sent to Pathology. Complications:            No immediate complications. Estimated blood loss:                            Minimal. Estimated Blood Loss:     Estimated blood loss was minimal. Impression:               - One 6 mm polyp at the splenic flexure, removed                            with a cold snare. Resected and retrieved.                           - Pseudopolyps in the transverse colon. Biopsies  obtained                           - The examined portion of the ileum was normal.                           - Diverticulosis in the left colon.                           - Internal hemorrhoids.                           - The examination was otherwise normal.                           - Biopsies for surveillance were taken.                           Overall, good control of colitis on present regimen. Recommendation:           - Patient has a contact number available for                            emergencies. The signs and symptoms of potential                            delayed complications were discussed with the                            patient. Return to normal activities tomorrow.                            Written discharge instructions were provided to the                            patient.                           - Resume previous diet.                           - Continue present medications.                           - No ibuprofen, naproxen, or other non-steroidal                            anti-inflammatory drugs for 2 weeks after polyp                            removal.                           - Await pathology results.                           - Repeat colonoscopy is recommended for  surveillance. The colonoscopy date will be                            determined after pathology results from today's                            exam become available for review. Remo Lipps P.  Armbruster MD, MD 04/26/2016 11:50:48 AM This report has been signed electronically.

## 2016-04-26 NOTE — Progress Notes (Signed)
A and O x3. Report to RN. Tolerated MAC anesthesia well.

## 2016-04-26 NOTE — Progress Notes (Signed)
Called to room to assist during endoscopic procedure.  Patient ID and intended procedure confirmed with present staff. Received instructions for my participation in the procedure from the performing physician.  

## 2016-04-27 ENCOUNTER — Telehealth: Payer: Self-pay

## 2016-04-27 NOTE — Telephone Encounter (Signed)
  Follow up Call-  Call back number 04/26/2016 04/29/2015  Post procedure Call Back phone  # [phone] hm [phone]  Permission to leave phone message Yes Yes  Some recent data might be hidden     Patient questions:  Do you have a fever, pain , or abdominal swelling? No. Pain Score  0 *  Have you tolerated food without any problems? Yes.    Have you been able to return to your normal activities? Yes.    Do you have any questions about your discharge instructions: Diet   No. Medications  No. Follow up visit  No.  Do you have questions or concerns about your Care? No.  Actions: * If pain score is 4 or above: No action needed, pain <4.

## 2016-05-02 ENCOUNTER — Encounter: Payer: Self-pay | Admitting: Gastroenterology

## 2016-05-02 NOTE — Progress Notes (Signed)
ok 

## 2016-05-05 ENCOUNTER — Other Ambulatory Visit: Payer: Self-pay | Admitting: Gastroenterology

## 2016-07-10 ENCOUNTER — Other Ambulatory Visit: Payer: Self-pay | Admitting: Family Medicine

## 2016-07-10 DIAGNOSIS — Z1231 Encounter for screening mammogram for malignant neoplasm of breast: Secondary | ICD-10-CM

## 2016-07-30 ENCOUNTER — Ambulatory Visit
Admission: RE | Admit: 2016-07-30 | Discharge: 2016-07-30 | Disposition: A | Discharge status: Home / Self Care | Payer: Medicare Other | Source: Ambulatory Visit | Attending: Family Medicine | Admitting: Family Medicine

## 2016-07-30 DIAGNOSIS — Z1231 Encounter for screening mammogram for malignant neoplasm of breast: Secondary | ICD-10-CM | POA: Diagnosis not present

## 2016-08-02 ENCOUNTER — Other Ambulatory Visit: Payer: Self-pay | Admitting: Gastroenterology

## 2016-08-16 ENCOUNTER — Other Ambulatory Visit: Payer: Self-pay | Admitting: Gastroenterology

## 2016-08-16 NOTE — Telephone Encounter (Signed)
Yes but please give 3 month supply at a time (360 tabs, 3 refills). thanks

## 2016-08-16 NOTE — Telephone Encounter (Signed)
Ok to refill Apriso?

## 2016-08-29 ENCOUNTER — Ambulatory Visit (INDEPENDENT_AMBULATORY_CARE_PROVIDER_SITE_OTHER): Payer: Medicare Other | Admitting: Family Medicine

## 2016-08-29 ENCOUNTER — Encounter: Payer: Self-pay | Admitting: Family Medicine

## 2016-08-29 VITALS — BP 143/81 | HR 64 | Temp 98.1°F | Wt 152.0 lb

## 2016-08-29 DIAGNOSIS — Z0000 Encounter for general adult medical examination without abnormal findings: Secondary | ICD-10-CM

## 2016-08-29 DIAGNOSIS — K50919 Crohn's disease, unspecified, with unspecified complications: Secondary | ICD-10-CM

## 2016-08-29 DIAGNOSIS — Z13220 Encounter for screening for lipoid disorders: Secondary | ICD-10-CM | POA: Diagnosis not present

## 2016-08-29 DIAGNOSIS — Z23 Encounter for immunization: Secondary | ICD-10-CM

## 2016-08-29 DIAGNOSIS — R030 Elevated blood-pressure reading, without diagnosis of hypertension: Secondary | ICD-10-CM

## 2016-08-29 DIAGNOSIS — Z1159 Encounter for screening for other viral diseases: Secondary | ICD-10-CM

## 2016-08-29 NOTE — Patient Instructions (Signed)
We will check blood work today.  Your exam was normal.  Come back in 6-12 months, or sooner as needed.  Take care,  Dr Jerline Pain

## 2016-08-29 NOTE — Progress Notes (Signed)
Subjective:  Diane Freeman is a 71 y.o. female who presents to the [facility] today with a chief complaint of annual physical.   HPI:  Annual Exam No acute complaints today. Trying to stay active and eat healthy. Due for pneumonia shot today and hepatitis C screen.  ROS: per HPI, otherwise all systems reviewed and are negative  PMH:  The following were reviewed and entered/updated in epic: Past Medical History:  Diagnosis Date  . Adrenal nodule ([place])   . Anemia   . Blood transfusion without reported diagnosis   . Cataract    bil removed  . Choledocholithiasis with obstruction   . Colitis   . Crohn's colitis ([place]) 07/11/12  . Diabetes mellitus    pt denies  . Diverticulosis   . Fatty liver   . GERD (gastroesophageal reflux disease)   . Hiatal hernia   . History of esophageal stricture   . Hx of gastritis    Patient Active Problem List   Diagnosis Date Noted  . Dyspnea 08/05/2014  . Headache 06/30/2014  . Palpitations 06/30/2014  . Elevated blood pressure 06/30/2014  . Bilateral hip flexor weakness 08/10/2013  . Subdural hematoma ([place]) 06/26/2013  . GI bleeding 06/18/2013  . Diastolic CHF ([place]) 77/41/2878  . Crohn's disease ([place]) 06/10/2013  . External hemorrhoid 02/05/2012  . Choledocholithiasis with obstruction 06/02/2011  . Vitamin D deficiency 05/29/2011  . Diverticulitis 06/19/2010  . ANEMIA 11/27/2007  . ESOPHAGEAL STRICTURE 11/27/2007  . GERD 11/27/2007   Past Surgical History:  Procedure Laterality Date  . CARDIAC CATHETERIZATION  05/2013  . CATARACT EXTRACTION    . CHOLECYSTECTOMY  2009  . COLONOSCOPY    . ERCP  06/02/2011   Procedure: ENDOSCOPIC RETROGRADE CHOLANGIOPANCREATOGRAPHY (ERCP);  Surgeon: Gatha Mayer, MD;  [facility];  Service: Endoscopy;  Laterality: N/A;  . LEFT AND RIGHT HEART CATHETERIZATION WITH CORONARY ANGIOGRAM N/A 06/15/2013   Procedure: LEFT AND RIGHT HEART CATHETERIZATION WITH CORONARY ANGIOGRAM- Shunt series;   Surgeon: Blane Ohara, MD;  Location: [facility];  Service: Cardiovascular;  Laterality: N/A;  . TUBAL LIGATION    . UPPER GASTROINTESTINAL ENDOSCOPY      Family History  Problem Relation Age of Onset  . Bone cancer Maternal Grandmother   . Colon cancer Maternal Aunt 48  . Stomach cancer Sister 66       decsd  . Breast cancer Sister   . Esophageal cancer Neg Hx   . Rectal cancer Neg Hx   . Pancreatic cancer Neg Hx     Medications- reviewed and updated Current Outpatient Prescriptions  Medication Sig Dispense Refill  . APRISO 0.375 g 24 hr capsule TAKE 2 CAPSULES BY MOUTH TWICE A DAY 360 capsule 3  . omeprazole (PRILOSEC) 20 MG capsule TAKE 1 CAPSULE (20 MG TOTAL) BY MOUTH EVERY MORNING. 90 capsule 0  . VITAMIN D, CHOLECALCIFEROL, PO Take 1 tablet by mouth as needed.     No current facility-administered medications for this visit.     Allergies-reviewed and updated No Known Allergies  Social History   Social History  . Marital status: Divorced    Spouse name: N/A  . Number of children: 2  . Years of education: N/A   Social History Main Topics  . Smoking status: Never Smoker  . Smokeless tobacco: Never Used  . Alcohol use No  . Drug use: No  . Sexual activity: Not Asked   Other Topics Concern  . None   Social History  Narrative  . None   Objective:  Physical Exam: BP (!) 143/81   Pulse 64   Temp 98.1 F (36.7 C) (Oral)   Wt 152 lb (68.9 kg)   SpO2 99%   BMI 26.93 kg/m   Gen: NAD, resting comfortably HEENT: OP clear. MMM. TMs clear bilaterally.  CV: RRR with no murmurs appreciated Pulm: NWOB, CTAB with no crackles, wheezes, or rhonchi GI: Normal bowel sounds present. Soft, Nontender, Nondistended. MSK: no edema, cyanosis, or clubbing noted Skin: warm, dry Neuro: CN 2-12 intact. Strength 5/5 in upper and lower extremities. Sensation to light touch intact throughout. Psych: Normal affect and thought content  Assessment/Plan:  Annual Wellness  Exam Normal physical exam today. Pneumonia shot given today. Will check screening labs and hepatitis C. Follow up in one year.   Algis Greenhouse. Jerline Pain, [place] Resident PGY-3 08/29/2016 4:54 PM

## 2016-08-30 ENCOUNTER — Encounter: Payer: Self-pay | Admitting: Family Medicine

## 2016-08-30 LAB — BASIC METABOLIC PANEL
BUN / CREAT RATIO: 13 (ref 12–28)
BUN: 12 mg/dL (ref 8–27)
CALCIUM: 9.4 mg/dL (ref 8.7–10.3)
CHLORIDE: 103 mmol/L (ref 96–106)
CO2: 21 mmol/L (ref 18–29)
Creatinine, Ser: 0.94 mg/dL (ref 0.57–1.00)
GFR calc Af Amer: 70 mL/min/{1.73_m2} (ref >59)
GFR calc non Af Amer: 61 mL/min/{1.73_m2} (ref >59)
GLUCOSE: 95 mg/dL (ref 65–99)
Potassium: 4.6 mmol/L (ref 3.5–5.2)
Sodium: 140 mmol/L (ref 134–144)

## 2016-08-30 LAB — CBC
HEMOGLOBIN: 12.8 g/dL (ref 11.1–15.9)
Hematocrit: 37.8 % (ref 34.0–46.6)
MCH: 27.9 pg (ref 26.6–33.0)
MCHC: 33.9 g/dL (ref 31.5–35.7)
MCV: 82 fL (ref 79–97)
PLATELETS: 232 10*3/uL (ref 150–379)
RBC: 4.59 x10E6/uL (ref 3.77–5.28)
RDW: 16 % — ABNORMAL HIGH (ref 12.3–15.4)
WBC: 8.6 10*3/uL (ref 3.4–10.8)

## 2016-08-30 LAB — HEPATITIS C ANTIBODY: Hep C Virus Ab: 0.2 s/co ratio (ref 0.0–0.9)

## 2016-10-25 ENCOUNTER — Other Ambulatory Visit: Payer: Self-pay | Admitting: Gastroenterology

## 2016-12-17 ENCOUNTER — Encounter: Payer: Self-pay | Admitting: Student

## 2016-12-17 ENCOUNTER — Ambulatory Visit (INDEPENDENT_AMBULATORY_CARE_PROVIDER_SITE_OTHER): Payer: Medicare Other | Admitting: Student

## 2016-12-17 VITALS — BP 132/78 | HR 70 | Temp 97.9°F | Ht 63.0 in | Wt 151.2 lb

## 2016-12-17 DIAGNOSIS — B029 Zoster without complications: Secondary | ICD-10-CM

## 2016-12-17 MED ORDER — VALACYCLOVIR HCL 1 G PO TABS: 1000.0000 mg | ORAL_TABLET | Freq: Three times a day (TID) | ORAL | 0 refills | Status: AC
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
## 2016-12-17 NOTE — Patient Instructions (Addendum)
It was great seeing you today! We have addressed the following issues today 1. Skin rash: this is likely due to shingles. We sent a prescription for Valtrex to your pharmacy. Please take this medication until you complete the whole course. You can also take ibuprofen up to 400 mg as needed for pain. You can use calamine lotion for burning. Calamine lotion is available over-the-counter at groceries for pharmacies.   Please get the shingles vaccine once you skin rash resolves.   Please talk to your gastroenterologist about your Crohn's disease medicine while you have shingles.   If we did any lab work today, and the results require attention, either me or my nurse will get in touch with you. If everything is normal, you will get a letter in mail and a message via . If you don't hear from [place] in two weeks, please give [place] a call. Otherwise, we look forward to seeing you again at your next visit. If you have any questions or concerns before then, please call the clinic at [phone].  Please bring all your medications to every doctors visit  Sign up for My Chart to have easy access to your labs results, and communication with your Primary care physician.    Please check-out at the front desk before leaving the clinic.    Take Care,   Dr. Cyndia Skeeters   Shingles Shingles is an infection that causes a painful skin rash and fluid-filled blisters. Shingles is caused by the same virus that causes chickenpox. Shingles only develops in people who:  Have had chickenpox.  Have gotten the chickenpox vaccine. (This is rare.)  The first symptoms of shingles may be itching, tingling, or pain in an area on your skin. A rash will follow in a few days or weeks. The rash is usually on one side of the body in a bandlike or beltlike pattern. Over time, the rash turns into fluid-filled blisters that break open, scab over, and dry up. Medicines may:  Help you manage pain.  Help you recover more quickly.  Help  to prevent long-term problems.  Follow these instructions at home: Medicines  Take medicines only as told by your doctor.  Apply an anti-itch or numbing cream to the affected area as told by your doctor. Blister and Rash Care  Take a cool bath or put cool compresses on the area of the rash or blisters as told by your doctor. This may help with pain and itching.  Keep your rash covered with a loose bandage (dressing). Wear loose-fitting clothing.  Keep your rash and blisters clean with mild soap and cool water or as told by your doctor.  Check your rash every day for signs of infection. These include redness, swelling, and pain that lasts or gets worse.  Do not pick your blisters.  Do not scratch your rash. General instructions  Rest as told by your doctor.  Keep all follow-up visits as told by your doctor. This is important.  Until your blisters scab over, your infection can cause chickenpox in people who have never had it or been vaccinated against it. To prevent this from happening, avoid touching other people or being around other people, especially: ? Babies. ? Pregnant women. ? Children who have eczema. ? Elderly people who have transplants. ? People who have chronic illnesses, such as leukemia or AIDS. Contact a doctor if:  Your pain does not get better with medicine.  Your pain does not get better after the rash heals.  Your rash looks infected. Signs of infection include: ? Redness. ? Swelling. ? Pain that lasts or gets worse. Get help right away if:  The rash is on your face or nose.  You have pain in your face, pain around your eye area, or loss of feeling on one side of your face.  You have ear pain or you have ringing in your ear.  You have loss of taste.  Your condition gets worse. This information is not intended to replace advice given to you by your health care provider. Make sure you discuss any questions you have with your health care  provider. Document Released: 09/05/2007 Document Revised: 11/13/2015 Document Reviewed: 12/29/2013 Elsevier Interactive Patient Education  [employer].

## 2017-05-06 ENCOUNTER — Encounter: Payer: Self-pay | Admitting: Gastroenterology

## 2017-06-27 ENCOUNTER — Telehealth: Payer: Self-pay

## 2017-06-27 ENCOUNTER — Ambulatory Visit (INDEPENDENT_AMBULATORY_CARE_PROVIDER_SITE_OTHER): Payer: Medicare Other | Admitting: Gastroenterology

## 2017-06-27 ENCOUNTER — Encounter: Payer: Self-pay | Admitting: Gastroenterology

## 2017-06-27 VITALS — BP 132/66 | HR 68 | Ht 63.0 in | Wt 147.2 lb

## 2017-06-27 DIAGNOSIS — K50119 Crohn's disease of large intestine with unspecified complications: Secondary | ICD-10-CM

## 2017-06-27 MED ORDER — SUPREP BOWEL PREP KIT 17.5-3.13-1.6 GM/177ML PO SOLN: ORAL | 0 refills | Status: DC

## 2017-06-27 NOTE — Patient Instructions (Signed)
If you are age 73 or older, your body mass index should be between 23-30. Your Body mass index is 26.08 kg/m. If this is out of the aforementioned range listed, please consider follow up with your Primary Care Provider.  If you are age 33 or younger, your body mass index should be between 19-25. Your Body mass index is 26.08 kg/m. If this is out of the aformentioned range listed, please consider follow up with your Primary Care Provider.   You have been scheduled for a colonoscopy. Please follow written instructions given to you at your visit today.  Please pick up your prep supplies at the pharmacy within the next 1-3 days. If you use inhalers (even only as needed), please bring them with you on the day of your procedure. Your physician has requested that you go to www.startemmi.com and enter the access code given to you at your visit today. This web site gives a general overview about your procedure. However, you should still follow specific instructions given to you by our office regarding your preparation for the procedure.  Please continue with your present medications.  Thank you for entrusting me with your care and for choosing [facility], Dr. [place] Cellar

## 2017-06-27 NOTE — Progress Notes (Signed)
HPI :  IBD HISTORY 72 y/o female: diagnosed with Crohns colitis since 2007. She has been on Apriso 1.5gm / day. She has been on on Lialda previously and the patient thinks it caused her hair to fall out. She thinks when she stopped Lialda the hair loss issue stopped. She has not had any hair loss with Apriso.   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  Since her last visit she has been feeling quite well.  She has not had any flares of her colitis now in a few years.  She states she has normal bowel habits, no constipation or diarrhea.  She denies any abdominal pains.  She is compliant with current dosing of her mesalamine.  She has some occasional low back pain but no other problems.  Her weight is stable.  She is eating okay, no nausea, no vomiting.  Since I last seen her she had a colonoscopy done in January 2018 which showed pseudoinflammatory polyposis which were biopsied, as well as 1 adenoma in the splenic flexure which was removed.  Random biopsy of the colon otherwise showed no dysplastic changes or evidence of other adenomas.  She has not required using any [place] enemas.  She has previously declined biologic therapy.    Past Medical History:  Diagnosis Date  . Adrenal nodule ([facility]   . Anemia   . Blood transfusion without reported diagnosis   . Cataract    bil removed  . Choledocholithiasis with obstruction   . Colitis   . Crohn's colitis ([place] 07/11[place]  . Diabetes mellitus    pt denies  .  Diverticulosis   . Fatty liver   . GERD (gastroesophageal reflux disease)   . Hiatal hernia   . History of esophageal stricture   . Hx of gastritis      Past Surgical History:  Procedure Laterality Date  . CARDIAC CATHETERIZATION  05/2013  . CATARACT EXTRACTION Bilateral   . CHOLECYSTECTOMY  2009  . COLONOSCOPY    . ERCP  06/02/2011   Procedure: ENDOSCOPIC RETROGRADE CHOLANGIOPANCREATOGRAPHY (ERCP);  Surgeon: CGatha Mayer MD;  [facility];  Service: Endoscopy;  Laterality: N/A;  . LEFT AND RIGHT HEART CATHETERIZATION WITH CORONARY ANGIOGRAM N/A 06/15/2013   Procedure: LEFT AND RIGHT HEART CATHETERIZATION WITH CORONARY ANGIOGRAM- Shunt series;  Surgeon: MBlane Ohara MD;  [facility];  Service: Cardiovascular;  Laterality: N/A;  . TUBAL LIGATION    . UPPER GASTROINTESTINAL ENDOSCOPY     Family History  Problem Relation Age of Onset  . Bone cancer Maternal Grandmother   . Colon cancer Maternal Aunt 661 . Stomach cancer Sister 653      decsd  . Breast cancer Sister   . Esophageal cancer Neg Hx   . Rectal cancer Neg Hx   . Pancreatic cancer Neg Hx    Social History   Tobacco Use  . Smoking status: Never Smoker  . Smokeless tobacco: Never Used  Substance Use Topics  . Alcohol use: No    Alcohol/week: 0.0 oz  .  Drug use: No   Current Outpatient Medications  Medication Sig Dispense Refill  . APRISO 0.375 g 24 hr capsule TAKE 2 CAPSULES BY MOUTH TWICE A DAY 360 capsule 3  . omeprazole (PRILOSEC) 20 MG capsule TAKE 1 CAPSULE (20 MG TOTAL) BY MOUTH EVERY MORNING. 90 capsule 3  . VITAMIN D, CHOLECALCIFEROL, PO Take 1 tablet by mouth as needed.     No current facility-administered medications for this visit.    No Known Allergies   Review of Systems: All systems reviewed and negative except where noted in HPI.   Lab Results  Component Value Date   WBC 8.6 08/29/2016   HGB 12.8 08/29/2016   HCT 37.8 08/29/2016   MCV 82 08/29/2016   PLT 232  08/29/2016    Lab Results  Component Value Date   CREATININE 0.94 08/29/2016   BUN 12 08/29/2016   NA 140 08/29/2016   K 4.6 08/29/2016   CL 103 08/29/2016   CO2 21 08/29/2016    Lab Results  Component Value Date   ALT 24 08/25/2015   AST 22 08/25/2015   ALKPHOS 100 08/25/2015   BILITOT 0.5 08/25/2015      Physical Exam: BP 132/66   Pulse 68   Ht 5' 3"  (1.6 m)   Wt 147 lb 3.2 oz (66.8 kg)   BMI 26.08 kg/m  Constitutional: Pleasant,well-developed, female in no acute distress. HEENT: Normocephalic and atraumatic. Conjunctivae are normal. No scleral icterus. Neck supple.  Cardiovascular: Normal rate, regular rhythm.  Pulmonary/chest: Effort normal and breath sounds normal. No wheezing, rales or rhonchi. Abdominal: Soft, nondistended, nontender.  There are no masses palpable. No hepatomegaly. Extremities: no edema Lymphadenopathy: No cervical adenopathy noted. Neurological: Alert and oriented to person place and time. Skin: Skin is warm and dry. No rashes noted. Psychiatric: Normal mood and affect. Behavior is normal.   ASSESSMENT AND PLAN: 72 year old female with a history of Crohn's colitis diagnosed in 2007, managed with mesalamine monotherapy over time with pretty good control of symptoms.  Previously intolerant to Lialda, now on Apriso and doing well.   Generally she has had mild Crohn's since her diagnosis.  While the evidence for mesalamine for Crohn's colitis is not good, she has declined escalation of her therapy in the past with immunomodulators and biologic therapy.  That being said her disease has been pretty well controlled in recent years.  Her colonoscopy shows quiescent colitis, only concerning finding is pseudo-inflammatory polyposis in the transverse colon.  She has had an adenoma removed in each of her prior colonoscopies.  She understands that pseudo-inflammatory polyposis can increase her risk for colon cancer given it can be challenging to identify new  adenomas, and recommend close surveillance colonoscopies given she has had multiple adenomas in the past.  She agreed with this plan, she prefers to schedule colonoscopy for early summer time.  Following discussion of risks and benefits of colonoscopy she want to proceed.  She will continue mesalamine in the interim.  Labs stable and up-to-date.  Bellamy Cellar, MD [facility] Pager [phone]

## 2017-06-27 NOTE — Telephone Encounter (Signed)
Pt scheduled for 10:00am but was supposed to be 1:30pm.  Pt has instruction for 1:30pm. Appt corrected on schedule. Called and clarified with pt.

## 2017-09-03 ENCOUNTER — Ambulatory Visit (AMBULATORY_SURGERY_CENTER): Payer: Medicare Other | Admitting: Gastroenterology

## 2017-09-03 ENCOUNTER — Other Ambulatory Visit: Payer: Self-pay

## 2017-09-03 ENCOUNTER — Encounter: Payer: Self-pay | Admitting: Gastroenterology

## 2017-09-03 ENCOUNTER — Encounter: Payer: Medicare Other | Admitting: Gastroenterology

## 2017-09-03 VITALS — BP 124/71 | HR 66 | Temp 97.5°F | Resp 16 | Ht 63.0 in | Wt 147.0 lb

## 2017-09-03 DIAGNOSIS — K635 Polyp of colon: Secondary | ICD-10-CM

## 2017-09-03 DIAGNOSIS — K50119 Crohn's disease of large intestine with unspecified complications: Secondary | ICD-10-CM | POA: Diagnosis not present

## 2017-09-03 DIAGNOSIS — K529 Noninfective gastroenteritis and colitis, unspecified: Secondary | ICD-10-CM | POA: Diagnosis not present

## 2017-09-03 DIAGNOSIS — K5150 Left sided colitis without complications: Secondary | ICD-10-CM | POA: Diagnosis not present

## 2017-09-03 DIAGNOSIS — K5090 Crohn's disease, unspecified, without complications: Secondary | ICD-10-CM | POA: Diagnosis not present

## 2017-09-03 MED ORDER — SODIUM CHLORIDE 0.9 % IV SOLN
500.0000 mL | Freq: Once | INTRAVENOUS | Status: DC
Start: 2017-09-03 — End: 2018-05-09

## 2017-09-03 NOTE — Progress Notes (Signed)
Called to room to assist during endoscopic procedure.  Patient ID and intended procedure confirmed with present staff. Received instructions for my participation in the procedure from the performing physician.  

## 2017-09-03 NOTE — Patient Instructions (Signed)
YOU HAD AN ENDOSCOPIC PROCEDURE TODAY AT [facility]:   Refer to the procedure report that was given to you for any specific questions about what was found during the examination.  If the procedure report does not answer your questions, please call your gastroenterologist to clarify.  If you requested that your care partner not be given the details of your procedure findings, then the procedure report has been included in a sealed envelope for you to review at your convenience later.  YOU SHOULD EXPECT: Some feelings of bloating in the abdomen. Passage of more gas than usual.  Walking can help get rid of the air that was put into your GI tract during the procedure and reduce the bloating. If you had a lower endoscopy (such as a colonoscopy or flexible sigmoidoscopy) you may notice spotting of blood in your stool or on the toilet paper. If you underwent a bowel prep for your procedure, you may not have a normal bowel movement for a few days.  Please Note:  You might notice some irritation and congestion in your nose or some drainage.  This is from the oxygen used during your procedure.  There is no need for concern and it should clear up in a day or so.  SYMPTOMS TO REPORT IMMEDIATELY:   Following lower endoscopy (colonoscopy or flexible sigmoidoscopy):  Excessive amounts of blood in the stool  Significant tenderness or worsening of abdominal pains  Swelling of the abdomen that is new, acute  Fever of 100F or higher  Please see handouts given to you on Diverticulosis.  For urgent or emergent issues, a gastroenterologist can be reached at any hour by calling [phone].   DIET:  We do recommend a small meal at first, but then you may proceed to your regular diet.  Drink plenty of fluids but you should avoid alcoholic beverages for 24 hours.  ACTIVITY:  You should plan to take it easy for the rest of today and you should NOT DRIVE or use heavy machinery until tomorrow  (because of the sedation medicines used during the test).    FOLLOW UP: Our staff will call the number listed on your records the next business day following your procedure to check on you and address any questions or concerns that you may have regarding the information given to you following your procedure. If we do not reach you, we will leave a message.  However, if you are feeling well and you are not experiencing any problems, there is no need to return our call.  We will assume that you have returned to your regular daily activities without incident.  If any biopsies were taken you will be contacted by phone or by letter within the next 1-3 weeks.  Please call us at [phone] if you have not heard about the biopsies in 3 weeks.    SIGNATURES/CONFIDENTIALITY: You and/or your care partner have signed paperwork which will be entered into your electronic medical record.  These signatures attest to the fact that that the information above on your After Visit Summary has been reviewed and is understood.  Full responsibility of the confidentiality of this discharge information lies with you and/or your care-partner.  Thank you for letting us take care of your healthcare needs today.

## 2017-09-03 NOTE — Op Note (Signed)
[facility] Patient Name: Diane Freeman Procedure Date: 09/03/2017 1:41 PM MRN: [id number] Endoscopist: Remo Lipps P. Havery Moros , MD Age: 72 Referring MD:  Date of Birth: 1946/02/09 Gender: Female Account #: [id number] Procedure:                Colonoscopy Indications:              Follow-up of Crohn's disease of the colon, on                            Apriso with good control of disease, history of                            pseudopolyposis as well as history of adenomas Medicines:                Monitored Anesthesia Care Procedure:                Pre-Anesthesia Assessment:                           - Prior to the procedure, a History and Physical                            was performed, and patient medications and                            allergies were reviewed. The patient's tolerance of                            previous anesthesia was also reviewed. The risks                            and benefits of the procedure and the sedation                            options and risks were discussed with the patient.                            All questions were answered, and informed consent                            was obtained. Prior Anticoagulants: The patient has                            taken no previous anticoagulant or antiplatelet                            agents. ASA Grade Assessment: II - A patient with                            mild systemic disease. After reviewing the risks                            and benefits, the patient was deemed in  satisfactory condition to undergo the procedure.                           After obtaining informed consent, the colonoscope                            was passed under direct vision. Throughout the                            procedure, the patient's blood pressure, pulse, and                            oxygen saturations were monitored continuously. The                            Model  [id number]-[id number]) scope was introduced                            through the anus and advanced to the the terminal                            ileum, with identification of the appendiceal                            orifice and IC valve. The colonoscopy was performed                            without difficulty. The patient tolerated the                            procedure well. The quality of the bowel                            preparation was adequate. The terminal ileum,                            ileocecal valve, appendiceal orifice, and rectum                            were photographed. Scope In: 1:43:03 PM Scope Out: 1:59:44 PM Scope Withdrawal Time: 0 hours 14 minutes 32 seconds  Total Procedure Duration: 0 hours 16 minutes 41 seconds  Findings:                 The perianal and digital rectal examinations were                            normal.                           The terminal ileum appeared normal.                           Multiple medium-mouthed diverticula were found in  the transverse colon and left colon.                           Multiple pseudopolyps were found at the splenic                            flexure and in the transverse colon, many of these                            were sampled with cold forceps.                           There was very mild patchy erythema in the left                            colon. The exam was otherwise without abnormality /                            inflammation on direct and retroflexion views.                           Biopsies were taken with a cold forceps for Crohn's                            disease surveillance in right, transverse, and left                            colon. These biopsy specimens were sent to                            Pathology. Complications:            No immediate complications. Estimated blood loss:                            Minimal. Estimated Blood Loss:      Estimated blood loss was minimal. Impression:               - The examined portion of the ileum was normal.                           - Diverticulosis in the transverse colon and in the                            left colon.                           - Pseudopolyps at the splenic flexure and in the                            transverse colon.                           - Mild erythema in the sigmoid colon, perhaps  related to diverticular disease.                           - The examination was otherwise normal on direct                            and retroflexion views.                           - Biopsies for surveillance were taken.                           Overall, good control of colitis on this exam. No                            obvious adenomatous polyps appreciated. Recommendation:           - Patient has a contact number available for                            emergencies. The signs and symptoms of potential                            delayed complications were discussed with the                            patient. Return to normal activities tomorrow.                            Written discharge instructions were provided to the                            patient.                           - Resume previous diet.                           - Continue present medications.                           - Await pathology results. Remo Lipps P. Armbruster, MD 09/03/2017 2:04:38 PM This report has been signed electronically.

## 2017-09-03 NOTE — Progress Notes (Signed)
Spontaneous respirations throughout. VSS. Resting comfortably. To PACU on room air. Report to  RN. 

## 2017-09-04 ENCOUNTER — Telehealth: Payer: Self-pay | Admitting: *Deleted

## 2017-09-04 NOTE — Telephone Encounter (Signed)
  Follow up Call-  Call back number 09/03/2017 04/26/2016 04/29/2015  Post procedure Call Back phone  # [phone] hm [phone]  Permission to leave phone message Yes Yes Yes  Some recent data might be hidden     Patient questions:  Do you have a fever, pain , or abdominal swelling? No. Pain Score  0 *  Have you tolerated food without any problems? Yes.    Have you been able to return to your normal activities? Yes.    Do you have any questions about your discharge instructions: Diet   No. Medications  No. Follow up visit  No.  Do you have questions or concerns about your Care? No.  Actions: * If pain score is 4 or above: No action needed, pain <4.

## 2017-09-16 ENCOUNTER — Other Ambulatory Visit: Payer: Self-pay

## 2017-09-16 DIAGNOSIS — K50119 Crohn's disease of large intestine with unspecified complications: Secondary | ICD-10-CM

## 2017-09-19 ENCOUNTER — Other Ambulatory Visit (INDEPENDENT_AMBULATORY_CARE_PROVIDER_SITE_OTHER): Payer: Medicare Other

## 2017-09-19 DIAGNOSIS — K50119 Crohn's disease of large intestine with unspecified complications: Secondary | ICD-10-CM

## 2017-09-19 LAB — COMPREHENSIVE METABOLIC PANEL
ALK PHOS: 100 U/L (ref 39–117)
ALT: 21 U/L (ref 0–35)
AST: 17 U/L (ref 0–37)
Albumin: 3.9 g/dL (ref 3.5–5.2)
BILIRUBIN TOTAL: 0.3 mg/dL (ref 0.2–1.2)
BUN: 11 mg/dL (ref 6–23)
CHLORIDE: 106 meq/L (ref 96–112)
CO2: 23 meq/L (ref 19–32)
CREATININE: 1.09 mg/dL (ref 0.40–1.20)
Calcium: 9.1 mg/dL (ref 8.4–10.5)
GFR: 52.25 mL/min — ABNORMAL LOW (ref >60.00)
Glucose, Bld: 97 mg/dL (ref 70–99)
Potassium: 4.4 mEq/L (ref 3.5–5.1)
Sodium: 137 mEq/L (ref 135–145)
Total Protein: 7.7 g/dL (ref 6.0–8.3)

## 2017-09-19 LAB — CBC WITH DIFFERENTIAL/PLATELET
Basophils Absolute: 0.1 10*3/uL (ref 0.0–0.1)
Basophils Relative: 0.7 % (ref 0.0–3.0)
EOS PCT: 3.6 % (ref 0.0–5.0)
Eosinophils Absolute: 0.3 10*3/uL (ref 0.0–0.7)
HEMATOCRIT: 37.3 % (ref 36.0–46.0)
HEMOGLOBIN: 12.6 g/dL (ref 12.0–15.0)
LYMPHS PCT: 29.9 % (ref 12.0–46.0)
Lymphs Abs: 2.3 10*3/uL (ref 0.7–4.0)
MCHC: 33.8 g/dL (ref 30.0–36.0)
MCV: 81 fl (ref 78.0–100.0)
MONO ABS: 0.7 10*3/uL (ref 0.1–1.0)
MONOS PCT: 9.6 % (ref 3.0–12.0)
Neutro Abs: 4.3 10*3/uL (ref 1.4–7.7)
Neutrophils Relative %: 56.2 % (ref 43.0–77.0)
Platelets: 247 10*3/uL (ref 150.0–400.0)
RBC: 4.61 Mil/uL (ref 3.87–5.11)
RDW: 15.6 % — ABNORMAL HIGH (ref 11.5–15.5)
WBC: 7.6 10*3/uL (ref 4.0–10.5)

## 2017-10-01 ENCOUNTER — Other Ambulatory Visit: Payer: Self-pay | Admitting: Gastroenterology

## 2017-10-29 ENCOUNTER — Other Ambulatory Visit: Payer: Self-pay | Admitting: Gastroenterology

## 2018-01-21 ENCOUNTER — Other Ambulatory Visit: Payer: Self-pay

## 2018-01-21 ENCOUNTER — Ambulatory Visit (INDEPENDENT_AMBULATORY_CARE_PROVIDER_SITE_OTHER): Payer: Medicare Other | Admitting: Family Medicine

## 2018-01-21 ENCOUNTER — Encounter: Payer: Self-pay | Admitting: Family Medicine

## 2018-01-21 VITALS — BP 136/66 | HR 78 | Temp 98.6°F | Ht 63.0 in | Wt 154.0 lb

## 2018-01-21 DIAGNOSIS — Z0000 Encounter for general adult medical examination without abnormal findings: Secondary | ICD-10-CM

## 2018-01-21 DIAGNOSIS — E785 Hyperlipidemia, unspecified: Secondary | ICD-10-CM | POA: Diagnosis not present

## 2018-01-21 DIAGNOSIS — G44219 Episodic tension-type headache, not intractable: Secondary | ICD-10-CM

## 2018-01-21 MED ORDER — TRAMADOL HCL 50 MG PO TABS: 50.0000 mg | ORAL_TABLET | Freq: Three times a day (TID) | ORAL | 0 refills | Status: DC | PRN

## 2018-01-21 NOTE — Patient Instructions (Addendum)
It was a pleasure to see you today! Thank you for choosing [facility] for your primary care. Diane Freeman was seen for annual physical.   For you headaches, you can try tramadol for pain as needed.   We are drawing cholesterol labs.   Best,  Marny Lowenstein, MD, MS FAMILY MEDICINE RESIDENT - PGY2 01/21/2018 2:58 PM
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
## 2018-01-24 DIAGNOSIS — E785 Hyperlipidemia, unspecified: Secondary | ICD-10-CM | POA: Insufficient documentation

## 2018-01-24 HISTORY — DX: Hyperlipidemia, unspecified: E78.5

## 2018-01-24 NOTE — Assessment & Plan Note (Addendum)
Chronic. No red flags or new focal neurologic signs. Not worsening. Not well controlled with tylenol. Trial of tramadol for pain. F/u in 1 months.

## 2018-01-24 NOTE — Assessment & Plan Note (Addendum)
Routine lipid panel screening. Other recent labs are within normal limits.

## 2018-02-11 ENCOUNTER — Encounter: Payer: Self-pay | Admitting: Gastroenterology

## 2018-02-17 ENCOUNTER — Encounter: Payer: Self-pay | Admitting: Family Medicine

## 2018-02-17 ENCOUNTER — Other Ambulatory Visit: Payer: Self-pay

## 2018-02-17 ENCOUNTER — Ambulatory Visit (INDEPENDENT_AMBULATORY_CARE_PROVIDER_SITE_OTHER): Payer: Medicare Other | Admitting: Family Medicine

## 2018-02-17 ENCOUNTER — Telehealth: Payer: Self-pay

## 2018-02-17 VITALS — BP 118/54 | HR 82 | Temp 97.8°F | Wt 151.4 lb

## 2018-02-17 DIAGNOSIS — L570 Actinic keratosis: Secondary | ICD-10-CM | POA: Insufficient documentation

## 2018-02-17 DIAGNOSIS — F0781 Postconcussional syndrome: Secondary | ICD-10-CM | POA: Insufficient documentation

## 2018-02-17 MED ORDER — AMITRIPTYLINE HCL 10 MG PO TABS: 10.0000 mg | ORAL_TABLET | Freq: Every day | ORAL | 3 refills | Status: DC

## 2018-02-17 NOTE — Telephone Encounter (Signed)
Received fax from [place] requesting prior authorization of Amitriptyline.  Clinical info submitted via CoverMyMeds. Status pending. Response may take up to 72 hours.  Key: [id number]  Danley Danker, RN [facility] RN)

## 2018-02-17 NOTE — Assessment & Plan Note (Signed)
Multiple scaling lesions on patient's face consistent with actinic keratosis.  Discussed with patient that these are likely precancerous lesions and can be treated easily in office.  Patient uninterested in treatment at this time.  Follow-up as needed.
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
## 2018-02-17 NOTE — Patient Instructions (Addendum)
It was a pleasure to see you today! Thank you for choosing [facility] for your primary care. Diane Freeman was seen for chronic head ache. This is likely due to your concussion when you had a fall.   We are starting a medicine to try and reduce the number of headaches. Start with 10 mg of amitriptyline at night for two weeks. If you tolerate this, you may increase to 20 mg a day. You may continue to take tramadol as needed.   Meds ordered this encounter  Medications  . amitriptyline (ELAVIL) 10 MG tablet    Sig: Take 1 tablet (10 mg total) by mouth at bedtime.    Dispense:  30 tablet    Refill:  3   Best,  Marny Lowenstein, MD, MS FAMILY MEDICINE RESIDENT - PGY2 02/17/2018 2:06 PM

## 2018-02-17 NOTE — Assessment & Plan Note (Signed)
Chronic, non-worsening headaches, consistent with postconcussive syndrome status post TBI.  No red flag symptoms such as blurry vision, unintentional weight loss, nausea vomiting. Headaches are debilitating and quite frequent with being 3 times a week.  Abortive therapy improved symptoms, patient still has difficulty.  Trial of prophylactic therapy with low-dose amitriptyline 10 mg nightly, may increase to 20 mg nightly if tolerating without side effects.  Patient return in 2 months to for follow-up

## 2018-02-18 NOTE — Telephone Encounter (Signed)
Approved. [facility] notified. Danley Danker, RN [facility] RN)

## 2018-03-13 ENCOUNTER — Other Ambulatory Visit: Payer: Self-pay

## 2018-03-13 ENCOUNTER — Other Ambulatory Visit: Payer: Self-pay | Admitting: Family Medicine

## 2018-03-13 DIAGNOSIS — F0781 Postconcussional syndrome: Secondary | ICD-10-CM

## 2018-03-13 MED ORDER — OMEPRAZOLE 20 MG PO CPDR: DELAYED_RELEASE_CAPSULE | ORAL | 0 refills | Status: DC

## 2018-03-13 NOTE — Telephone Encounter (Signed)
Received refill request from Pharmacy for Omeprazole.  Pt last seen 05-2017. Filled for 90 days

## 2018-05-09 ENCOUNTER — Encounter: Payer: Self-pay | Admitting: Gastroenterology

## 2018-05-09 ENCOUNTER — Ambulatory Visit (INDEPENDENT_AMBULATORY_CARE_PROVIDER_SITE_OTHER): Payer: Medicare Other | Admitting: Gastroenterology

## 2018-05-09 VITALS — BP 156/76 | HR 80 | Ht 62.25 in | Wt 153.2 lb

## 2018-05-09 DIAGNOSIS — R1310 Dysphagia, unspecified: Secondary | ICD-10-CM | POA: Diagnosis not present

## 2018-05-09 DIAGNOSIS — K5010 Crohn's disease of large intestine without complications: Secondary | ICD-10-CM | POA: Diagnosis not present

## 2018-05-09 NOTE — Patient Instructions (Addendum)
If you are age 74 or older, your body mass index should be between 23-30. Your Body mass index is 27.79 kg/m. If this is out of the aforementioned range listed, please consider follow up with your Primary Care Provider.  If you are age 59 or younger, your body mass index should be between 19-25. Your Body mass index is 27.79 kg/m. If this is out of the aformentioned range listed, please consider follow up with your Primary Care Provider.   You have been scheduled for an endoscopy. Please follow written instructions given to you at your visit today. If you use inhalers (even only as needed), please bring them with you on the day of your procedure. Your physician has requested that you go to www.startemmi.com and enter the access code given to you at your visit today. This web site gives a general overview about your procedure. However, you should still follow specific instructions given to you by our office regarding your preparation for the procedure.  Continue Apriso.   Thank you for entrusting me with your care and for choosing [facility], Dr. [place] Cellar

## 2018-05-09 NOTE — Progress Notes (Signed)
HPI :  IBD HISTORY 73y/o female: diagnosed with Crohns colitis since 2007.She has been on Apriso 1.5gm / day.She has been on on Lialda previously and the patient thinks it caused her hair to fall out. She thinks when she stopped Lialda the hair loss issue stopped. She has not had any hair loss with Apriso.   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 The exam was otherwise without abnormality / inflammation on direct and retroflexion views.  EGD 09/23/2007 - mid esophageal stricture, dilated to 182m SINCE LAST VISIT  [age]ear old female here for follow-up visit for her colitis. She has been maintained on Apriso and has been doing very well. Her last colonoscopy was as outlined above, last June she had stable state of inflammatory polyps, biopsies showed no dysplasia. Very mild inflammation in the left colon. Generally she's been doing very well on her regimen. She has anywhere from 2-3 bowel movements a day which are formed and she states overall he thinks that this is going well for her. She denies any abdominal pains. She denies any blood in her stools.  She does endorse having some dysphagia to solids that has been bothering her for the past 6 months or so. She reports occasional food will get stuck in her mid chest with swallows, she has to drink  water to push it through. She has had these symptoms in the past, last EGD was in 2009 at which she had a mid esophageal stricture dilated to 16 mm which resolved her symptoms. She had not had any recurrent dysphagia until the past 6 months. She is on omeprazole 20 mg per day for reflux. She states this controls her symptoms pretty well. She denies any nausea or vomiting.   Past Medical History:  Diagnosis Date  . Adrenal nodule ([place]  . Anemia   . Blood transfusion without reported diagnosis   . Cataract    bil removed  . Choledocholithiasis with obstruction   . Colitis   . [place]  . Diabetes mellitus    pt denies  . Diverticulosis   . Fatty liver   . GERD (gastroesophageal reflux disease)   . Hiatal hernia   . History of esophageal stricture   . Hx of gastritis      Past Surgical History:  Procedure Laterality Date  . CARDIAC CATHETERIZATION  05/2013  . CATARACT EXTRACTION Bilateral   . CHOLECYSTECTOMY  2009  . COLONOSCOPY    . ERCP  06/02/2011   Procedure: ENDOSCOPIC RETROGRADE CHOLANGIOPANCREATOGRAPHY (ERCP);  Surgeon: CaGatha MayerMD;  [facility];  Service: Endoscopy;  Laterality: N/A;  . LEFT AND RIGHT HEART CATHETERIZATION WITH CORONARY ANGIOGRAM N/A 06/15/2013   Procedure: LEFT AND RIGHT HEART CATHETERIZATION WITH CORONARY ANGIOGRAM- Shunt series;  Surgeon: MiBlane OharaMD;  [facility];  Service: Cardiovascular;  Laterality: N/A;  . TUBAL LIGATION    . UPPER GASTROINTESTINAL ENDOSCOPY  Family History  Problem Relation Age of Onset  . Bone cancer Maternal Grandmother   . Colon cancer Maternal Aunt 32  . Stomach cancer Sister 68       decsd  . Breast cancer Sister   . Esophageal cancer Neg Hx   . Rectal cancer Neg Hx   . Pancreatic cancer Neg Hx    Social History   Tobacco Use  . Smoking status: Never Smoker  . Smokeless tobacco: Never Used  Substance Use Topics  . Alcohol use: No    Alcohol/week: 0.0  standard drinks  . Drug use: No   Current Outpatient Medications  Medication Sig Dispense Refill  . APRISO 0.375 g 24 hr capsule TAKE 2 CAPSULES TWICE DAILY 360 capsule 2  . omeprazole (PRILOSEC) 20 MG capsule TAKE 1 CAPSULE (20 MG TOTAL) BY MOUTH EVERY MORNING. 90 capsule 0  . VITAMIN D, CHOLECALCIFEROL, PO Take 1 tablet by mouth every morning.      No current facility-administered medications for this visit.    No Known Allergies   Review of Systems: All systems reviewed and negative except where noted in HPI.   Lab Results  Component Value Date   WBC 7.6 09/19/2017   HGB 12.6 09/19/2017   HCT 37.3 09/19/2017   MCV 81.0 09/19/2017   PLT 247.0 09/19/2017    Lab Results  Component Value Date   CREATININE 1.09 09/19/2017   BUN 11 09/19/2017   NA 137 09/19/2017   K 4.4 09/19/2017   CL 106 09/19/2017   CO2 23 09/19/2017    Lab Results  Component Value Date   ALT 21 09/19/2017   AST 17 09/19/2017   ALKPHOS 100 09/19/2017   BILITOT 0.3 09/19/2017     Physical Exam: BP (!) 156/76   Pulse 80   Ht 5' 2.25" (1.581 m)   Wt 153 lb 3 oz (69.5 kg)   BMI 27.79 kg/m  Constitutional: Pleasant,well-developed, female in no acute distress. HEENT: Normocephalic and atraumatic. Conjunctivae are normal. No scleral icterus. Neck supple.  Cardiovascular: Normal rate, regular rhythm.  Pulmonary/chest: Effort normal and breath sounds normal. No wheezing, rales or rhonchi. Abdominal: Soft, nondistended, nontender.  There are no masses palpable. No hepatomegaly. Extremities: no edema Lymphadenopathy: No cervical adenopathy noted. Neurological: Alert and oriented to person place and time. Skin: Skin is warm and dry. No rashes noted. Psychiatric: Normal mood and affect. Behavior is normal.   ASSESSMENT AND PLAN: 73 year old female here for reassessment following issues:  Crohn's colitis - while the evidence for mesalamine for Crohn's colitis is not good, she has declined  escalation of her therapy in the past with immunomodulators and biologic therapy.  That being said her disease has been pretty well controlled on this regimen and she's not had any flares of her condition.  Her colonoscopy shows pseudo-inflammatory polyposis. She understands that pseudo-inflammatory polyposis can increase her risk for colon cancer given it can be challenging to identify new adenomas, and recommend continued close surveillance with colonoscopy later this year. Otherwise continue Apriso for now. She agreed. F/u in 6 months.  Dysphagia - intermittent solid food dysphagia with a history of benign esophageal stricture in the past requiring dilation. I suspect her symptoms are likely due to recurrent stricture. I'm recommending upper endoscopy with dilation to further evaluate and treat. I discussed the risks and benefits of this with her and she wanted to proceed. Further recommendation pending result. I advised her to avoid eating meat or chicken to avoid  impaction in the interim or if she does eat this take very small bites and chew it very well.  [place] Cellar, MD [facility]

## 2018-05-20 ENCOUNTER — Telehealth: Payer: Self-pay

## 2018-05-20 ENCOUNTER — Encounter: Payer: Self-pay | Admitting: Gastroenterology

## 2018-05-20 ENCOUNTER — Ambulatory Visit (AMBULATORY_SURGERY_CENTER): Payer: Medicare Other | Admitting: Gastroenterology

## 2018-05-20 VITALS — BP 120/71 | HR 88 | Temp 98.0°F | Resp 14 | Ht 62.25 in | Wt 153.0 lb

## 2018-05-20 DIAGNOSIS — R1310 Dysphagia, unspecified: Secondary | ICD-10-CM

## 2018-05-20 DIAGNOSIS — K222 Esophageal obstruction: Secondary | ICD-10-CM

## 2018-05-20 DIAGNOSIS — K219 Gastro-esophageal reflux disease without esophagitis: Secondary | ICD-10-CM | POA: Diagnosis not present

## 2018-05-20 DIAGNOSIS — K2970 Gastritis, unspecified, without bleeding: Secondary | ICD-10-CM | POA: Diagnosis not present

## 2018-05-20 DIAGNOSIS — K449 Diaphragmatic hernia without obstruction or gangrene: Secondary | ICD-10-CM

## 2018-05-20 MED ORDER — SODIUM CHLORIDE 0.9 % IV SOLN: 500.0000 mL | Freq: Once | INTRAVENOUS | Status: DC

## 2018-05-20 NOTE — Telephone Encounter (Signed)
rec'd letter from [facility] that Apriso - Mesalamine cap 0.375gm is not on their formulary and is not covered. Pt also has a new member ID: [phone].  Diane Freeman indicated she will fax [place] a form for Prior Authorization.

## 2018-05-20 NOTE — Progress Notes (Signed)
Called to room to assist during endoscopic procedure.  Patient ID and intended procedure confirmed with present staff. Received instructions for my participation in the procedure from the performing physician.  

## 2018-05-20 NOTE — Op Note (Signed)
[place] Patient Name: Diane Freeman Date: 05/20/2018 2:57 PM MRN: [id number] Endoscopist: Remo Lipps P. Havery Moros , MD Age: 73 Referring MD:  Date of Birth: Apr 30, 1945 Gender: Female Account #: [id number] Freeman:                Upper GI endoscopy Indications:              Dysphagia Medicines:                Monitored Anesthesia Care Freeman:                Pre-Anesthesia Assessment:                           - Prior to the Freeman, a History and Physical                            was performed, and patient medications and                            allergies were reviewed. The patient's tolerance of                            previous anesthesia was also reviewed. The risks                            and benefits of the Freeman and the sedation                            options and risks were discussed with the patient.                            All questions were answered, and informed consent                            was obtained. Prior Anticoagulants: The patient has                            taken no previous anticoagulant or antiplatelet                            agents. ASA Grade Assessment: II - A patient with                            mild systemic disease. After reviewing the risks                            and benefits, the patient was deemed in                            satisfactory condition to undergo the Freeman.                           After obtaining informed consent, the endoscope was  passed under direct vision. Throughout the                            Freeman, the patient's blood pressure, pulse, and                            oxygen saturations were monitored continuously. The                            Endoscope was introduced through the mouth, and                            advanced to the second part of duodenum. The upper                            GI endoscopy was accomplished without  difficulty.                            The patient tolerated the Freeman well. Scope In: Scope Out: Findings:                 Esophagogastric landmarks were identified: the                            Z-line was found at 33 cm, the gastroesophageal                            junction was found at 33 cm and the upper extent of                            the gastric folds was found at 37 cm from the                            incisors.                           A 4 cm hiatal hernia was present.                           One benign-appearing, intrinsic stenosis was found                            33 cm from the incisors, entering into the hernia                            sac. This stenosis measured less than one cm (in                            length). There was a subtle change in the mucosa on                            initial inspection which appeared to be inflamed  tissue from prolapse within the hernia sac. A TTS                            dilator was passed through the scope. Dilation with                            a 16-17-18 mm balloon dilator was performed to 16                            mm, 17 mm and 18 mm after which a small mucosal                            wrent was noted. The stricture was fairly pliable                            however, dysphagia could be coming from the                            angulated turn entering the hernia sac. Biopsies                            were taken with a cold forceps for histology to                            ensure benign.                           The exam of the esophagus was otherwise normal.                           The entire examined stomach was normal.                           The duodenal bulb and second portion of the                            duodenum were normal. Complications:            No immediate complications. Estimated blood loss:                            Minimal. Estimated Blood  Loss:     Estimated blood loss was minimal. Impression:               - Esophagogastric landmarks identified.                           - 4 cm hiatal hernia.                           - Benign-appearing esophageal stenosis at the GEJ                            entering into an angulated turn into the hernia.  Dilated to 8m. Biopsied.                           - Normal stomach.                           - Normal duodenal bulb and second portion of the                            duodenum. Recommendation:           - Patient has a contact number available for                            emergencies. The signs and symptoms of potential                            delayed complications were discussed with the                            patient. Return to normal activities tomorrow.                            Written discharge instructions were provided to the                            patient.                           - Post dysphagia diet                           - Continue present medications.                           - Await pathology results and course following                            dilation Steven P. AHavery Moros MD 05/20/2018 3:26:57 PM This report has been signed electronically.

## 2018-05-20 NOTE — Patient Instructions (Signed)
Please read handouts provided. Follow Dilation Diet. Continue present medications. Await pathology results.      YOU HAD AN ENDOSCOPIC PROCEDURE TODAY AT [facility]:   Refer to the procedure report that was given to you for any specific questions about what was found during the examination.  If the procedure report does not answer your questions, please call your gastroenterologist to clarify.  If you requested that your care partner not be given the details of your procedure findings, then the procedure report has been included in a sealed envelope for you to review at your convenience later.  YOU SHOULD EXPECT: Some feelings of bloating in the abdomen. Passage of more gas than usual.  Walking can help get rid of the air that was put into your GI tract during the procedure and reduce the bloating. If you had a lower endoscopy (such as a colonoscopy or flexible sigmoidoscopy) you may notice spotting of blood in your stool or on the toilet paper. If you underwent a bowel prep for your procedure, you may not have a normal bowel movement for a few days.  Please Note:  You might notice some irritation and congestion in your nose or some drainage.  This is from the oxygen used during your procedure.  There is no need for concern and it should clear up in a day or so.  SYMPTOMS TO REPORT IMMEDIATELY:   Following upper endoscopy (EGD)  Vomiting of blood or coffee ground material  New chest pain or pain under the shoulder blades  Painful or persistently difficult swallowing  New shortness of breath  Fever of 100F or higher  Black, tarry-looking stools  For urgent or emergent issues, a gastroenterologist can be reached at any hour by calling [phone].   DIET:  Drink plenty of fluids but you should avoid alcoholic beverages for 24 hours.  ACTIVITY:  You should plan to take it easy for the rest of today and you should NOT DRIVE or use heavy machinery until tomorrow  (because of the sedation medicines used during the test).    FOLLOW UP: Our staff will call the number listed on your records the next business day following your procedure to check on you and address any questions or concerns that you may have regarding the information given to you following your procedure. If we do not reach you, we will leave a message.  However, if you are feeling well and you are not experiencing any problems, there is no need to return our call.  We will assume that you have returned to your regular daily activities without incident.  If any biopsies were taken you will be contacted by phone or by letter within the next 1-3 weeks.  Please call us at [phone] if you have not heard about the biopsies in 3 weeks.    SIGNATURES/CONFIDENTIALITY: You and/or your care partner have signed paperwork which will be entered into your electronic medical record.  These signatures attest to the fact that that the information above on your After Visit Summary has been reviewed and is understood.  Full responsibility of the confidentiality of this discharge information lies with you and/or your care-partner.

## 2018-05-20 NOTE — Progress Notes (Signed)
Report given to PACU, vss 

## 2018-05-21 ENCOUNTER — Telehealth: Payer: Self-pay | Admitting: *Deleted

## 2018-05-21 NOTE — Telephone Encounter (Signed)
  Follow up Call-  Call back number 05/20/2018 09/03/2017 04/26/2016  Post procedure Call Back phone  # [phone] House [phone] hm  Permission to leave phone message Yes Yes Yes  Some recent data might be hidden     Patient questions:  Do you have a fever, pain , or abdominal swelling? No. Pain Score  0 *  Have you tolerated food without any problems? Yes.    Have you been able to return to your normal activities? Yes.    Do you have any questions about your discharge instructions: Diet   No. Medications  No. Follow up visit  No.  Do you have questions or concerns about your Care? No.  Actions: * If pain score is 4 or above: No action needed, pain <4.

## 2018-05-21 NOTE — Telephone Encounter (Signed)
Form completed and faxed. Awaiting reply

## 2018-05-23 NOTE — Telephone Encounter (Signed)
Prior Auth resubmitted with additional information regarding drugs tried and failed.

## 2018-05-27 NOTE — Telephone Encounter (Signed)
Prior Auth approved for Mesalamine (Apriso) 0.375  - 2 capsules twice daily. Reference #: [id number].

## 2018-05-28 NOTE — Telephone Encounter (Signed)
[phone] contact number for [facility]. Approval letter scanned to chart.

## 2018-06-06 ENCOUNTER — Other Ambulatory Visit: Payer: Self-pay | Admitting: Gastroenterology

## 2018-06-26 ENCOUNTER — Other Ambulatory Visit: Payer: Self-pay

## 2018-06-26 ENCOUNTER — Ambulatory Visit (INDEPENDENT_AMBULATORY_CARE_PROVIDER_SITE_OTHER): Payer: Medicare Other | Admitting: Family Medicine

## 2018-06-26 ENCOUNTER — Telehealth: Payer: Self-pay | Admitting: *Deleted

## 2018-06-26 DIAGNOSIS — R05 Cough: Secondary | ICD-10-CM

## 2018-06-26 DIAGNOSIS — R058 Other specified cough: Secondary | ICD-10-CM

## 2018-06-26 MED ORDER — IPRATROPIUM BROMIDE 0.06 % NA SOLN: 2.0000 | Freq: Four times a day (QID) | NASAL | 12 refills | Status: DC

## 2018-06-26 NOTE — Telephone Encounter (Signed)
Pt lm on nurse line.  She is having a cough at night and would like something called in. Will forward to PCP as he is telemedicine this afternoon. Christen Bame, CMA

## 2018-06-26 NOTE — Progress Notes (Signed)
[facility] Telemedicine Visit  Patient consented to have visit conducted via telephone.  Encounter participants: Patient: Diane Freeman  Provider: Bonnita Hollow  Others (if applicable):   Chief Complaint: cough  HPI:  Cough: Patient complains of productive cough with sputum described as white.  Symptoms began 1 week ago.  The cough is without wheezing, dyspnea or hemoptysis, nocturnal and is aggravated by nothing Associated symptoms include:none. Patient does not have new pets. Patient did have runny nose which did resolve. Patient does not have a history of asthma. Patient does not have a history of environmental allergens. Patient does not have recent travel. Patient does not have a history of smoking. Patient  does not have previous Chest X-ray. Patient does not have had a PPD done. Pt has been trying Tussem w/o improvement. Denies SOB, weight gain. Patient sleeps w/ 2 pillows at night. Denies PND. Patient has h/o GERD and hiatal hernia, but denies heart burn. Recent EGD for dysphagia, had esophageal stricture s/p balloon inflation. Biopsies taken, were benign. H/o heart failure G1DD EF 60-65% in 2015.    ROS: As Per HPI  Pertinent PMHx: Hiatal hernia, Esophogeal Stricture, Diastolic HF  Assessment/Plan: Problem List Items Addressed This Visit    None    Visit Diagnoses    Post-viral cough syndrome    -  Primary   Relevant Medications   ipratropium (ATROVENT) 0.06 % nasal spray     Patient has s/s of cough s/p what seems to be a viral URI. URI symptoms have resolved, except lingering cough. Given no SOB, DOE, significant orthopnea, do not feel like this is HF. Pt recently seen by GI and is treated with acid suppression, so GERD is not likley the cause of cough.  - cont tussem OTC cough syrup - ipratropium nasal spray  Time spent on phone with patient: 10 minutes

## 2018-09-18 ENCOUNTER — Encounter: Payer: Self-pay | Admitting: Gastroenterology

## 2018-11-18 ENCOUNTER — Other Ambulatory Visit: Payer: Self-pay | Admitting: Gastroenterology

## 2018-11-19 ENCOUNTER — Other Ambulatory Visit: Payer: Self-pay | Admitting: Gastroenterology

## 2018-12-10 ENCOUNTER — Other Ambulatory Visit: Payer: Self-pay | Admitting: Gastroenterology

## 2018-12-10 DIAGNOSIS — F0781 Postconcussional syndrome: Secondary | ICD-10-CM

## 2019-01-28 ENCOUNTER — Encounter: Payer: Self-pay | Admitting: Gastroenterology

## 2019-02-06 ENCOUNTER — Other Ambulatory Visit: Payer: Self-pay

## 2019-02-06 ENCOUNTER — Ambulatory Visit (AMBULATORY_SURGERY_CENTER): Payer: Medicare Other | Admitting: *Deleted

## 2019-02-06 VITALS — Temp 97.4°F | Ht 62.25 in | Wt 155.0 lb

## 2019-02-06 DIAGNOSIS — K5010 Crohn's disease of large intestine without complications: Secondary | ICD-10-CM

## 2019-02-06 DIAGNOSIS — Z86010 Personal history of colonic polyps: Secondary | ICD-10-CM

## 2019-02-06 DIAGNOSIS — Z1159 Encounter for screening for other viral diseases: Secondary | ICD-10-CM

## 2019-02-06 MED ORDER — NA SULFATE-K SULFATE-MG SULF 17.5-3.13-1.6 GM/177ML PO SOLN: ORAL | 0 refills | Status: DC

## 2019-02-06 NOTE — Progress Notes (Signed)
Patient is here in-person for PV. Patient denies any allergies to eggs or soy. Patient denies any problems with anesthesia/sedation. Patient denies any oxygen use at home. Patient denies taking any diet/weight loss medications or blood thinners. Patient is not being treated for MRSA or C-diff.    COVID-19 screening test is on 11/16at 940am, the pt is aware. Pt is aware that care partner will wait in the car during procedure; if they feel like they will be too hot or cold to wait in the car; they may wait in the 4 th floor lobby. Patient is aware to bring only one care partner. We want them to wear a mask (we do not have any that we can provide them), practice social distancing, and we will check their temperatures when they get here.  I did remind the patient that their care partner needs to stay in the parking lot the entire time and have a cell phone available, we will call them when the pt is ready for discharge. Patient will wear mask into building.

## 2019-02-12 ENCOUNTER — Other Ambulatory Visit: Payer: Self-pay | Admitting: Gastroenterology

## 2019-02-12 ENCOUNTER — Other Ambulatory Visit: Payer: Self-pay | Admitting: *Deleted

## 2019-02-12 MED ORDER — MESALAMINE ER 0.375 G PO CP24: 1.5000 g | ORAL_CAPSULE | Freq: Every day | ORAL | 0 refills | Status: DC

## 2019-02-12 NOTE — Telephone Encounter (Signed)
I contacted pharmacy to d/c script sent for Apriso 4 tablets twice daily sent a few moments ago in error. Script should read Apriso 4 tablets (1.5 g) once daily. Pharmacy has discontinued twice daily script.

## 2019-02-16 ENCOUNTER — Ambulatory Visit (INDEPENDENT_AMBULATORY_CARE_PROVIDER_SITE_OTHER): Payer: Medicare Other

## 2019-02-16 ENCOUNTER — Other Ambulatory Visit: Payer: Self-pay | Admitting: Gastroenterology

## 2019-02-16 DIAGNOSIS — Z1159 Encounter for screening for other viral diseases: Secondary | ICD-10-CM

## 2019-02-16 DIAGNOSIS — Z03818 Encounter for observation for suspected exposure to other biological agents ruled out: Secondary | ICD-10-CM | POA: Diagnosis not present

## 2019-02-17 LAB — SARS CORONAVIRUS 2 (TAT 6-24 HRS): SARS Coronavirus 2: NEGATIVE

## 2019-02-18 ENCOUNTER — Other Ambulatory Visit: Payer: Self-pay

## 2019-02-18 ENCOUNTER — Encounter: Payer: Self-pay | Admitting: Gastroenterology

## 2019-02-18 ENCOUNTER — Ambulatory Visit (AMBULATORY_SURGERY_CENTER): Payer: Medicare Other | Admitting: Gastroenterology

## 2019-02-18 VITALS — BP 147/73 | HR 62 | Temp 97.9°F | Resp 23 | Ht 62.25 in | Wt 155.0 lb

## 2019-02-18 DIAGNOSIS — Z86010 Personal history of colonic polyps: Secondary | ICD-10-CM | POA: Diagnosis not present

## 2019-02-18 DIAGNOSIS — K219 Gastro-esophageal reflux disease without esophagitis: Secondary | ICD-10-CM | POA: Diagnosis not present

## 2019-02-18 DIAGNOSIS — K50119 Crohn's disease of large intestine with unspecified complications: Secondary | ICD-10-CM | POA: Diagnosis not present

## 2019-02-18 DIAGNOSIS — K5150 Left sided colitis without complications: Secondary | ICD-10-CM | POA: Diagnosis not present

## 2019-02-18 DIAGNOSIS — K5090 Crohn's disease, unspecified, without complications: Secondary | ICD-10-CM | POA: Diagnosis not present

## 2019-02-18 DIAGNOSIS — K529 Noninfective gastroenteritis and colitis, unspecified: Secondary | ICD-10-CM | POA: Diagnosis not present

## 2019-02-18 MED ORDER — SODIUM CHLORIDE 0.9 % IV SOLN: 500.0000 mL | Freq: Once | INTRAVENOUS | Status: DC

## 2019-02-18 NOTE — Progress Notes (Signed)
Pt's states no medical or surgical changes since previsit or office visit.  Athena.

## 2019-02-18 NOTE — Op Note (Signed)
[place] Patient Name: Diane Freeman Procedure Date: 02/18/2019 11:41 AM MRN: [id number] Endoscopist: Remo Lipps P. Havery Moros , MD Age: 73 Referring MD:  Date of Birth: [birth year] Gender: Female Account #: [id number] Procedure:                Colonoscopy Indications:              Crohn's colitis - managed with Apriso with good                            control of inflammation in recent years. history of                            pseudopolyps, here for surveillance. Medicines:                Monitored Anesthesia Care Procedure:                Pre-Anesthesia Assessment:                           - Prior to the procedure, a History and Physical                            was performed, and patient medications and                            allergies were reviewed. The patient's tolerance of                            previous anesthesia was also reviewed. The risks                            and benefits of the procedure and the sedation                            options and risks were discussed with the patient.                            All questions were answered, and informed consent                            was obtained. Prior Anticoagulants: The patient has                            taken no previous anticoagulant or antiplatelet                            agents. ASA Grade Assessment: II - A patient with                            mild systemic disease. After reviewing the risks                            and benefits, the patient was deemed in  satisfactory condition to undergo the procedure.                           After obtaining informed consent, the colonoscope                            was passed under direct vision. Throughout the                            procedure, the patient's blood pressure, pulse, and                            oxygen saturations were monitored continuously. The                            Colonoscope  was introduced through the anus and                            advanced to the the terminal ileum, with                            identification of the appendiceal orifice and IC                            valve. The colonoscopy was performed without                            difficulty. The patient tolerated the procedure                            well. The quality of the bowel preparation was                            good. The terminal ileum, ileocecal valve,                            appendiceal orifice, and rectum were photographed. Scope In: 01:75:10 AM Scope Out: 12:16:56 PM Scope Withdrawal Time: 0 hours 16 minutes 32 seconds  Total Procedure Duration: 0 hours 20 minutes 39 seconds  Findings:                 The perianal and digital rectal examinations were                            normal.                           The terminal ileum appeared normal.                           Multiple medium-mouthed diverticula were found in                            the sigmoid colon, descending colon and transverse  colon.                           Patchy pseudopolyps were found in the descending                            colon, in the transverse colon and in the ascending                            colon, many of these were biopsied as below.                           The exam was otherwise without abnormality. No                            suspected adenomas appreciated.                           Biopsies were taken with a cold forceps from the                            right colon, left colon and transverse colon. These                            biopsy specimens were sent to Pathology. Complications:            No immediate complications. Estimated blood loss:                            Minimal. Estimated Blood Loss:     Estimated blood loss was minimal. Impression:               - The examined portion of the ileum was normal.                           -  Diverticulosis in the sigmoid colon, in the                            descending colon and in the transverse colon.                           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                           - The examination was otherwise normal.                           - Biopsies for surveillance were taken from the                            right colon, left colon and transverse colon.  Overall good control of inflammation on present                            regimen. Recommendation:           - Patient has a contact number available for                            emergencies. The signs and symptoms of potential                            delayed complications were discussed with the                            patient. Return to normal activities tomorrow.                            Written discharge instructions were provided to the                            patient.                           - Resume previous diet.                           - Continue present medications.                           - Await pathology results. Remo Lipps P. Taleigh Gero, MD 02/18/2019 12:22:21 PM This report has been signed electronically.

## 2019-02-18 NOTE — Progress Notes (Signed)
Report given to PACU, vss 

## 2019-02-18 NOTE — Patient Instructions (Signed)
Information on diverticulosis given to you.  Await pathology results.  YOU HAD AN ENDOSCOPIC PROCEDURE TODAY AT [facility]:   Refer to the procedure report that was given to you for any specific questions about what was found during the examination.  If the procedure report does not answer your questions, please call your gastroenterologist to clarify.  If you requested that your care partner not be given the details of your procedure findings, then the procedure report has been included in a sealed envelope for you to review at your convenience later.  YOU SHOULD EXPECT: Some feelings of bloating in the abdomen. Passage of more gas than usual.  Walking can help get rid of the air that was put into your GI tract during the procedure and reduce the bloating. If you had a lower endoscopy (such as a colonoscopy or flexible sigmoidoscopy) you may notice spotting of blood in your stool or on the toilet paper. If you underwent a bowel prep for your procedure, you may not have a normal bowel movement for a few days.  Please Note:  You might notice some irritation and congestion in your nose or some drainage.  This is from the oxygen used during your procedure.  There is no need for concern and it should clear up in a day or so.  SYMPTOMS TO REPORT IMMEDIATELY:   Following lower endoscopy (colonoscopy or flexible sigmoidoscopy):  Excessive amounts of blood in the stool  Significant tenderness or worsening of abdominal pains  Swelling of the abdomen that is new, acute  Fever of 100F or higher    For urgent or emergent issues, a gastroenterologist can be reached at any hour by calling [phone].   DIET:  We do recommend a small meal at first, but then you may proceed to your regular diet.  Drink plenty of fluids but you should avoid alcoholic beverages for 24 hours.  ACTIVITY:  You should plan to take it easy for the rest of today and you should NOT DRIVE or use heavy  machinery until tomorrow (because of the sedation medicines used during the test).    FOLLOW UP: Our staff will call the number listed on your records 48-72 hours following your procedure to check on you and address any questions or concerns that you may have regarding the information given to you following your procedure. If we do not reach you, we will leave a message.  We will attempt to reach you two times.  During this call, we will ask if you have developed any symptoms of COVID 19. If you develop any symptoms (ie: fever, flu-like symptoms, shortness of breath, cough etc.) before then, please call [phone].  If you test positive for Covid 19 in the 2 weeks post procedure, please call and report this information to [place].    If any biopsies were taken you will be contacted by phone or by letter within the next 1-3 weeks.  Please call us at [phone] if you have not heard about the biopsies in 3 weeks.    SIGNATURES/CONFIDENTIALITY: You and/or your care partner have signed paperwork which will be entered into your electronic medical record.  These signatures attest to the fact that that the information above on your After Visit Summary has been reviewed and is understood.  Full responsibility of the confidentiality of this discharge information lies with you and/or your care-partner.

## 2019-02-18 NOTE — Progress Notes (Signed)
Called to room to assist during endoscopic procedure.  Patient ID and intended procedure confirmed with present staff. Received instructions for my participation in the procedure from the performing physician.  

## 2019-02-20 ENCOUNTER — Telehealth: Payer: Self-pay

## 2019-02-20 ENCOUNTER — Telehealth: Payer: Self-pay | Admitting: *Deleted

## 2019-02-20 NOTE — Telephone Encounter (Signed)
  Follow up Call-  Call back number 02/18/2019 05/20/2018 09/03/2017  Post procedure Call Back phone  # [phone] - house [phone] House [phone]  Permission to leave phone message Yes Yes Yes  Some recent data might be hidden     No ID on voicemail No message left Will try again midday

## 2019-02-20 NOTE — Telephone Encounter (Signed)
No answer for post procedure call back. Left  Message for patient to call with questions or concerns. SM

## 2019-02-28 ENCOUNTER — Telehealth: Payer: Self-pay | Admitting: Family Medicine

## 2019-02-28 NOTE — Telephone Encounter (Signed)
Family medicine telephone after-hours line  Patient calling because she has a cough.  Has been going on for a few days is characterized as a more dry cough.  She is asking for over-the-counter recommendations to help with her cough.  Recommended if she is having any soreness from her coughing Tylenol is a good option for that.  On top of that she can take Robitussin-DM with cold and flu formulation that should help provide some relief.  If she does not improve over the weekend I did recommend scheduling a telemedicine visit to be evaluated on Monday.  Guadalupe Dawn MD PGY-3 Family Medicine Resident

## 2019-03-23 ENCOUNTER — Other Ambulatory Visit: Payer: Self-pay | Admitting: Gastroenterology

## 2019-04-29 ENCOUNTER — Other Ambulatory Visit: Payer: Self-pay | Admitting: Gastroenterology

## 2019-07-23 ENCOUNTER — Other Ambulatory Visit: Payer: Self-pay

## 2019-07-23 ENCOUNTER — Encounter (HOSPITAL_COMMUNITY): Payer: Self-pay

## 2019-07-23 ENCOUNTER — Emergency Department (HOSPITAL_COMMUNITY)
Admission: EM | Admit: 2019-07-23 | Discharge: 2019-07-23 | Disposition: A | Discharge status: Home / Self Care | Payer: Medicare Other | Attending: Emergency Medicine | Admitting: Emergency Medicine

## 2019-07-23 ENCOUNTER — Emergency Department (HOSPITAL_COMMUNITY): Payer: Medicare Other

## 2019-07-23 DIAGNOSIS — W208XXA Other cause of strike by thrown, projected or falling object, initial encounter: Secondary | ICD-10-CM | POA: Diagnosis not present

## 2019-07-23 DIAGNOSIS — S090XXA Injury of blood vessels of head, not elsewhere classified, initial encounter: Secondary | ICD-10-CM | POA: Diagnosis present

## 2019-07-23 DIAGNOSIS — Y999 Unspecified external cause status: Secondary | ICD-10-CM | POA: Diagnosis not present

## 2019-07-23 DIAGNOSIS — Y939 Activity, unspecified: Secondary | ICD-10-CM | POA: Insufficient documentation

## 2019-07-23 DIAGNOSIS — R519 Headache, unspecified: Secondary | ICD-10-CM | POA: Insufficient documentation

## 2019-07-23 DIAGNOSIS — Z79899 Other long term (current) drug therapy: Secondary | ICD-10-CM | POA: Diagnosis not present

## 2019-07-23 DIAGNOSIS — Y929 Unspecified place or not applicable: Secondary | ICD-10-CM | POA: Insufficient documentation

## 2019-07-23 DIAGNOSIS — S0990XA Unspecified injury of head, initial encounter: Secondary | ICD-10-CM

## 2019-07-23 DIAGNOSIS — S0083XA Contusion of other part of head, initial encounter: Secondary | ICD-10-CM | POA: Diagnosis not present
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
## 2019-07-23 NOTE — Discharge Instructions (Signed)
Your imaging today showed no intracranial hemorrhage, bleeding, or any fractures.  Your forehead was bruised and was bruising down the side towards her eye.  Your exam was very reassuring.  We suspect it is primarily a ecchymosis/bruise/hematoma on your forehead causing it.  No other concerning finding on your history or exam and with reassuring imaging, we feel you are safe for discharge home.  Please follow-up with your primary team and if any symptoms change or worsen, please return to nearest emergency room.

## 2019-07-27 DIAGNOSIS — S0083XA Contusion of other part of head, initial encounter: Secondary | ICD-10-CM | POA: Diagnosis not present

## 2019-07-27 DIAGNOSIS — S0990XA Unspecified injury of head, initial encounter: Secondary | ICD-10-CM | POA: Diagnosis not present

## 2019-08-24 ENCOUNTER — Ambulatory Visit (INDEPENDENT_AMBULATORY_CARE_PROVIDER_SITE_OTHER): Payer: Medicare Other | Admitting: Family Medicine

## 2019-08-24 DIAGNOSIS — R05 Cough: Secondary | ICD-10-CM | POA: Diagnosis present

## 2019-08-24 DIAGNOSIS — R058 Other specified cough: Secondary | ICD-10-CM | POA: Insufficient documentation

## 2019-08-24 MED ORDER — ROBITUSSIN COUGH+CHEST CONG DM 10-200 MG PO CAPS: 1.0000 | ORAL_CAPSULE | ORAL | 0 refills | Status: DC | PRN

## 2019-08-24 NOTE — Assessment & Plan Note (Signed)
Low suspicion for pneumonia based on lack of fever and no respiratory issues noted.  More likely viral infection versus allergies.  Due to age, will advise chest x-ray to ensure no pneumonia. -Robitussin as needed for cough -Follow-up chest x-ray

## 2019-08-24 NOTE — Progress Notes (Signed)
[facility] Telemedicine Visit  Patient consented to have virtual visit and was identified by name and date of birth. Method of visit: Telephone  Encounter participants: Patient: Diane Freeman - located at home Provider: Matilde Haymaker - located at [facility] Others (if applicable): none  Chief Complaint: Cough  HPI:  Cough with sputum production Ms. Diane Freeman reports that she has had a cough with mild sputum production for about 1 week.  She reports coughing up yellow sputum without blood.  She denies copious production of sputum.  She reports that her cough comes and goes.  In addition to her coughing she has been having some nasal congestion as well.  She denies fever or trouble breathing.  She notes some chest pain with coughing and deep inhalation.  No chest pain noted with exertion.  She had a mild case of Covid 11/21 from which she recovered without issue.  ROS: per HPI  Pertinent PMHx: Diastolic CHF, GERD  Exam:  There were no vitals taken for this visit.  Respiratory: Breathing comfortably on room air.  Able to complete long sentences without effort.  Assessment/Plan:  Productive cough Low suspicion for pneumonia based on lack of fever and no respiratory issues noted.  More likely viral infection versus allergies.  Due to age, will advise chest x-ray to ensure no pneumonia. -Robitussin as needed for cough -Follow-up chest x-ray   Diane Freeman was called back after the visit to ensure that she understood that she should have a chest x-ray done at [facility].  She plans on having the x-ray done tomorrow.  Time spent during visit with patient: 15 minutes

## 2019-08-24 NOTE — Progress Notes (Signed)
Attempted to call patient twice at number provided with no answer.  Jazmin Hartsell,CMA

## 2019-08-25 ENCOUNTER — Other Ambulatory Visit: Payer: Self-pay

## 2019-08-25 ENCOUNTER — Ambulatory Visit
Admission: RE | Admit: 2019-08-25 | Discharge: 2019-08-25 | Disposition: A | Discharge status: Home / Self Care | Payer: Medicare Other | Source: Ambulatory Visit | Attending: Family Medicine | Admitting: Family Medicine

## 2019-08-25 DIAGNOSIS — R05 Cough: Secondary | ICD-10-CM | POA: Diagnosis not present

## 2019-08-25 DIAGNOSIS — R058 Other specified cough: Secondary | ICD-10-CM

## 2019-08-26 ENCOUNTER — Telehealth: Payer: Self-pay

## 2019-08-26 NOTE — Telephone Encounter (Signed)
Patient LVM on nurse line requesting CXR results and next steps.   To Dr. Pilar Plate  Please advise  Talbot Grumbling, RN

## 2019-08-26 NOTE — Telephone Encounter (Signed)
Ms. Scroggs was contacted and informed that she had an unremarkable xray. No further treatment or workup at this time.

## 2019-08-29 ENCOUNTER — Other Ambulatory Visit: Payer: Self-pay | Admitting: Gastroenterology

## 2019-09-08 ENCOUNTER — Telehealth: Payer: Self-pay | Admitting: Family Medicine

## 2019-09-08 NOTE — Telephone Encounter (Signed)
Called patient to set up AWV, Someone answered and didn't speak. If pt calls back schedule AWV.

## 2019-09-22 ENCOUNTER — Other Ambulatory Visit: Payer: Self-pay | Admitting: Gastroenterology

## 2019-10-17 ENCOUNTER — Other Ambulatory Visit: Payer: Self-pay | Admitting: Gastroenterology

## 2019-12-04 ENCOUNTER — Ambulatory Visit (INDEPENDENT_AMBULATORY_CARE_PROVIDER_SITE_OTHER): Payer: Medicare Other | Admitting: Gastroenterology

## 2019-12-04 ENCOUNTER — Other Ambulatory Visit (INDEPENDENT_AMBULATORY_CARE_PROVIDER_SITE_OTHER): Payer: Medicare Other

## 2019-12-04 ENCOUNTER — Encounter: Payer: Self-pay | Admitting: Gastroenterology

## 2019-12-04 VITALS — BP 156/74 | HR 73 | Ht 62.0 in | Wt 156.4 lb

## 2019-12-04 DIAGNOSIS — R1310 Dysphagia, unspecified: Secondary | ICD-10-CM

## 2019-12-04 DIAGNOSIS — K219 Gastro-esophageal reflux disease without esophagitis: Secondary | ICD-10-CM

## 2019-12-04 DIAGNOSIS — K5010 Crohn's disease of large intestine without complications: Secondary | ICD-10-CM | POA: Diagnosis not present

## 2019-12-04 LAB — CBC WITH DIFFERENTIAL/PLATELET
Basophils Absolute: 0.1 10*3/uL (ref 0.0–0.1)
Basophils Relative: 0.8 % (ref 0.0–3.0)
Eosinophils Absolute: 0.3 10*3/uL (ref 0.0–0.7)
Eosinophils Relative: 3.4 % (ref 0.0–5.0)
HCT: 36.7 % (ref 36.0–46.0)
Hemoglobin: 12.3 g/dL (ref 12.0–15.0)
Lymphocytes Relative: 23.6 % (ref 12.0–46.0)
Lymphs Abs: 2.1 10*3/uL (ref 0.7–4.0)
MCHC: 33.5 g/dL (ref 30.0–36.0)
MCV: 80 fl (ref 78.0–100.0)
Monocytes Absolute: 1 10*3/uL (ref 0.1–1.0)
Monocytes Relative: 11.1 % (ref 3.0–12.0)
Neutro Abs: 5.3 10*3/uL (ref 1.4–7.7)
Neutrophils Relative %: 61.1 % (ref 43.0–77.0)
Platelets: 262 10*3/uL (ref 150.0–400.0)
RBC: 4.58 Mil/uL (ref 3.87–5.11)
RDW: 16.8 % — ABNORMAL HIGH (ref 11.5–15.5)
WBC: 8.7 10*3/uL (ref 4.0–10.5)

## 2019-12-04 LAB — COMPREHENSIVE METABOLIC PANEL
ALT: 18 U/L (ref 0–35)
AST: 19 U/L (ref 0–37)
Albumin: 4.2 g/dL (ref 3.5–5.2)
Alkaline Phosphatase: 86 U/L (ref 39–117)
BUN: 13 mg/dL (ref 6–23)
CO2: 23 mEq/L (ref 19–32)
Calcium: 9.3 mg/dL (ref 8.4–10.5)
Chloride: 100 mEq/L (ref 96–112)
Creatinine, Ser: 1.15 mg/dL (ref 0.40–1.20)
GFR: 45.94 mL/min — ABNORMAL LOW (ref >60.00)
Glucose, Bld: 102 mg/dL — ABNORMAL HIGH (ref 70–99)
Potassium: 4.6 mEq/L (ref 3.5–5.1)
Sodium: 132 mEq/L — ABNORMAL LOW (ref 135–145)
Total Bilirubin: 0.2 mg/dL (ref 0.2–1.2)
Total Protein: 7.7 g/dL (ref 6.0–8.3)

## 2019-12-04 MED ORDER — MESALAMINE ER 0.375 G PO CP24: 1500.0000 mg | ORAL_CAPSULE | Freq: Every day | ORAL | 3 refills | Status: DC

## 2019-12-04 NOTE — Progress Notes (Signed)
HPI :  IBD HISTORY 74y/o female: diagnosed with Crohns colitis since 2007.She has been on Apriso 1.5gm / day.She has been on on Lialda previously and the patient thinks it caused her hair to fall out. She thinks when she stopped Lialda the hair loss issue stopped. She has not had any hair loss with Apriso.   SINCE LAST VISIT  74 year old female here for a follow-up visit for her ulcerative colitis.  She is doing really well since of last seen her.  She does not really have any complaints today.  She continues to be compliant with Apriso, 4 tabs per day.  She denies any bowel habit problems.  No diarrhea.  No blood in her stools.  No abdominal pains.  She did come down with Covid last November December and was able to get through that okay.  Her last colonoscopy with me was in November 2020.  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  Biopsies did not show any dysplastic change or adenomatous change.  Her colon overall appeared to have no overt inflammation but biopsies showed microscopic mild inflammatory changes with chronicity.  No dysplastic change.  Otherwise she has had a history of a hiatal hernia with reflux, and mild distal esophageal stricture.  She had this dilated in February 2020 and it worked pretty well for her symptoms.  She still gets occasional mild dysphagia based off what she is eating, but generally she is doing okay.  She takes omeprazole 20 mg a day and it works well for her symptoms of reflux.   Prior endoscopic evaluation. EGD 05/20/18 -  - A 4 cm hiatal hernia was present. - One benign-appearing, intrinsic stenosis was found 33 cm from the incisors, entering into the hernia sac. This stenosis measured less than one cm (in length). There was a subtle change in the mucosa on initial inspection which appeared to be inflamed tissue from prolapse within the hernia sac. A TTS dilator was passed through the scope. Dilation with a 16-17-18 mm balloon dilator was  performed to 16 mm, 17 mm and 18 mm after which a small mucosal wrent was noted. The stricture was fairly pliable however, dysphagia could be coming from the angulated turn entering the hernia sac. Biopsies were taken with a cold forceps for histology to ensure benign. - The exam of the esophagus was otherwise normal. - The entire examined stomach was normal. - The duodenal bulb and second portion of the duodenum were normal.  Surgical [P], stricture site - CARDIAC-TYPE MUCOSA WITH MILD NON-SPECIFIC INFLAMMATION. - NEGATIVE FOR INTESTINAL METAPLASIA, DYSPLASIA OR MALIGNANCY.    Colonoscopy 02/18/19 - The perianal and digital rectal examinations were normal. - The terminal ileum appeared normal. - Multiple medium-mouthed diverticula were found in the sigmoid colon, descending colon and transverse colon. - Patchy pseudopolyps were found in the descending colon, in the transverse colon and in the ascending colon, many of these were biopsied as below. - The exam was otherwise without abnormality. No suspected adenomas appreciated. - Biopsies were taken with a cold forceps from the right colon, left colon and transverse colon. These biopsy specimens were sent to Pathology.  1. Surgical [P], right colon - CHRONIC COLITIS WITH FOCAL ACTIVITY - NO GRANULOMATA, DYSPLASIA OR MALIGNANCY IDENTIFIED 2. Surgical [P], colon, transverse - CHRONIC MILDLY ACTIVE COLITIS - NO GRANULOMATA, DYSPLASIA OR MALIGNANCY IDENTIFIED 3. Surgical [P], left colon - CHRONIC MILDLY ACTIVE COLITIS - NO GRANULOMATA, DYSPLASIA OR MALIGNANCY IDENTIFIED   Colonoscopy 04/29/15 - mild erythema /  inflammatory changes of the left colon, inflammatory pseudopolyposis from the left colon through transverse colon - surveillance biopsies takenthroughout the entire colon showing mildly active colitis, One 23m polyp removed via hot snare in the transverse colon c/w adenoma  Colonoscopy 04/26/2016 - splenic flexure adenoma,  multiple transverse pseudoinflammatory polyps, normal colon otherwise - benign mucosa / quiscent colitis on path  Colonoscopy 09/03/2017 - terminal ileum normal, diverticulosis, pseudoinflammatory polyps splenic flexure and in the transverse colon, many of these were sampled with cold forceps, mild patchy erythema in the left colon. The exam was otherwise without abnormality / inflammation on direct and retroflexion views.  EGD 09/23/2007 - mid esophageal stricture, dilated to 198m   Past Medical History:  Diagnosis Date  . Adrenal nodule ([place]  . Anemia   . Blood transfusion without reported diagnosis   . Cataract    bil removed  . Choledocholithiasis with obstruction   . Colitis   . Crohn's colitis ([place]4/11/14  . Diverticulosis   . Fatty liver   . GERD (gastroesophageal reflux disease)   . Hiatal hernia   . History of esophageal stricture   . Hx of gastritis      Past Surgical History:  Procedure Laterality Date  . CARDIAC CATHETERIZATION  05/2013  . CATARACT EXTRACTION Bilateral   . CHOLECYSTECTOMY  2009  . COLONOSCOPY  last 09/03/2017  . ERCP  06/02/2011   Procedure: ENDOSCOPIC RETROGRADE CHOLANGIOPANCREATOGRAPHY (ERCP);  Surgeon: CaGatha MayerMD;  [facility];  Service: Endoscopy;  Laterality: N/A;  . LEFT AND RIGHT HEART CATHETERIZATION WITH CORONARY ANGIOGRAM N/A 06/15/2013   Procedure: LEFT AND RIGHT HEART CATHETERIZATION WITH CORONARY ANGIOGRAM- Shunt series;  Surgeon: MiBlane OharaMD;  [facility];  Service: Cardiovascular;  Laterality: N/A;  . TUBAL LIGATION    . UPPER GASTROINTESTINAL ENDOSCOPY     Family History  Problem Relation Age of Onset  . Bone cancer Maternal Grandmother   . Colon cancer Maternal Aunt [phone]. Stomach cancer Sister [phone]     decsd  . Breast cancer Sister   . Esophageal cancer Neg Hx   . Rectal cancer Neg Hx   . Pancreatic cancer Neg Hx   . Colon polyps Neg Hx    Social History   Tobacco Use  . Smoking  status: Never Smoker  . Smokeless tobacco: Never Used  Vaping Use  . Vaping Use: Never used  Substance Use Topics  . Alcohol use: No    Alcohol/week: 0.0 standard drinks  . Drug use: No   Current Outpatient Medications  Medication Sig Dispense Refill  . Dextromethorphan-guaiFENesin (ROBITUSSIN COUGH+CHEST CONG DM) 10-200 MG CAPS Take 1 capsule by mouth every 4 (four) hours as needed (as needed for cough or chest congestion.). 20 capsule 0  . mesalamine (APRISO) 0.375 g 24 hr capsule Take 1.5 mg by mouth daily.    . Marland Kitchenmeprazole (PRILOSEC) 20 MG capsule TAKE 1 CAPSULE BY MOUTH EVERY DAY 90 capsule 0  . VITAMIN D, CHOLECALCIFEROL, PO Take 1 tablet by mouth every morning.      No current facility-administered medications for this visit.   No Known Allergies   Review of Systems: All systems reviewed and negative except where noted in HPI.   Lab Results  Component Value Date   WBC 8.7 12/04/2019   HGB 12.3 12/04/2019   HCT 36.7 12/04/2019   MCV 80.0 12/04/2019   PLT 262.0 12/04/2019    Lab Results  Component Value Date   CREATININE 1.15 12/04/2019   BUN 13 12/04/2019   NA 132 (L) 12/04/2019   K 4.6 12/04/2019   CL 100 12/04/2019   CO2 23 12/04/2019    Lab Results  Component Value Date   ALT 18 12/04/2019   AST 19 12/04/2019   ALKPHOS 86 12/04/2019   BILITOT 0.2 12/04/2019     Physical Exam: BP (!) 156/74 (BP Location: Left Arm, Patient Position: Sitting, Cuff Size: Normal)   Pulse 73   Ht 5' 2"  (1.575 m)   Wt 156 lb 6 oz (70.9 kg)   SpO2 99%   BMI 28.60 kg/m  Constitutional: Pleasant,well-developed, female in no acute distress. Abdominal: Soft, nondistended, nontender.  There are no masses palpable.  Extremities: no edema Lymphadenopathy: No cervical adenopathy noted. Neurological: Alert and oriented to person place and time. Skin: Skin is warm and dry. No rashes noted. Psychiatric: Normal mood and affect. Behavior is normal.   ASSESSMENT AND  PLAN: 74 year old female here for reassessment of the following issues:  Crohn's colitis - we have discussed options in the past, she has not wanted to escalate above mesalamine after discussion of options given she clinically feels well and inflammation is only mild.  She is not willing to accept risks and cost of other options at this time.  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  She had no significant overt inflammation on her last exam, but she did have some microscopic mild inflammatory changes.  She clinically feels really well.  We discussed timing of her next surveillance colonoscopy, we will plan for doing this in about 6 months or so.  We discussed whether not we should do a fecal calprotectin in the interim, however given she feels well, she would not escalate therapy purely for a lab test results.  In this light we will hold off on checking calprotectin.  Basic labs done today to ensure stable, no evidence of anemia.  Overall doing well.  I will see her again in 6 months and plan colonoscopy at that time.  Dysphagia / GERD - she has a moderate sized hiatal hernia with an angulated entrance to the hernia sac on EGD, this is contributing to her dysphagia on top of the mild stenosis at the [place].  This was dilated and she had some benefit but continues have some mild ongoing symptoms.  Unless this is severe or causing significant issues for her, she wants to hold off on any intervention at this point time, she does not want the hiatal hernia repaired and less refractory symptoms.  We otherwise discussed risk benefits of chronic PPI use, she wants to continue present dosing as it works well for her symptoms.  All questions answered.  [place], MD [facility]

## 2019-12-04 NOTE — Patient Instructions (Addendum)
If you are age 75 or older, your body mass index should be between 23-30. Your Body mass index is 28.6 kg/m. If this is out of the aforementioned range listed, please consider follow up with your Primary Care Provider.  If you are age 73 or younger, your body mass index should be between 19-25. Your Body mass index is 28.6 kg/m. If this is out of the aformentioned range listed, please consider follow up with your Primary Care Provider.   Please go to the lab in the basement of our building to have lab work done as you leave today. Hit "B" for basement when you get on the elevator.  When the doors open the lab is on your left.  We will call you with the results. Thank you.  Due to recent changes in healthcare laws, you may see the results of your imaging and laboratory studies on MyChart before your provider has had a chance to review them.  We understand that in some cases there may be results that are confusing or concerning to you. Not all laboratory results come back in the same time frame and the provider may be waiting for multiple results in order to interpret others.  Please give [place] 48 hours in order for your provider to thoroughly review all the results before contacting the office for clarification of your results.   We have sent the following medications to your pharmacy for you to pick up at your convenience: Apriso: Take 4 tablet by mouth daily  Thank you for entrusting me with your care and for choosing [facility], Dr. Millsap Cellar

## 2020-01-08 ENCOUNTER — Other Ambulatory Visit: Payer: Self-pay | Admitting: Gastroenterology

## 2020-04-01 ENCOUNTER — Other Ambulatory Visit: Payer: Self-pay | Admitting: Gastroenterology

## 2020-05-17 ENCOUNTER — Encounter: Payer: Self-pay | Admitting: Gastroenterology

## 2020-06-06 ENCOUNTER — Encounter: Payer: Self-pay | Admitting: Gastroenterology

## 2020-06-24 ENCOUNTER — Other Ambulatory Visit: Payer: Self-pay | Admitting: Gastroenterology

## 2020-08-02 ENCOUNTER — Ambulatory Visit (AMBULATORY_SURGERY_CENTER): Payer: Self-pay

## 2020-08-02 ENCOUNTER — Other Ambulatory Visit: Payer: Self-pay

## 2020-08-02 VITALS — Ht 62.0 in | Wt 155.0 lb

## 2020-08-02 DIAGNOSIS — K5010 Crohn's disease of large intestine without complications: Secondary | ICD-10-CM

## 2020-08-02 MED ORDER — NA SULFATE-K SULFATE-MG SULF 17.5-3.13-1.6 GM/177ML PO SOLN: 1.0000 | Freq: Once | ORAL | 0 refills | Status: AC

## 2020-08-02 NOTE — Progress Notes (Signed)
No allergies to soy or egg Pt is not on blood thinners or diet pills Denies issues with sedation/intubation Denies atrial flutter/fib Denies constipation   Emmi instructions given to pt  Pt is aware of Covid safety and care partner requirements.

## 2020-08-16 ENCOUNTER — Other Ambulatory Visit: Payer: Self-pay

## 2020-08-16 ENCOUNTER — Encounter: Payer: Self-pay | Admitting: Gastroenterology

## 2020-08-16 ENCOUNTER — Ambulatory Visit (AMBULATORY_SURGERY_CENTER): Payer: Medicare Other | Admitting: Gastroenterology

## 2020-08-16 VITALS — BP 113/52 | HR 62 | Temp 97.7°F | Resp 13 | Ht 62.0 in | Wt 155.0 lb

## 2020-08-16 DIAGNOSIS — Z8719 Personal history of other diseases of the digestive system: Secondary | ICD-10-CM | POA: Diagnosis not present

## 2020-08-16 DIAGNOSIS — K5090 Crohn's disease, unspecified, without complications: Secondary | ICD-10-CM | POA: Diagnosis not present

## 2020-08-16 DIAGNOSIS — K5010 Crohn's disease of large intestine without complications: Secondary | ICD-10-CM | POA: Diagnosis not present

## 2020-08-16 MED ORDER — SODIUM CHLORIDE 0.9 % IV SOLN: 500.0000 mL | Freq: Once | INTRAVENOUS | Status: DC

## 2020-08-16 NOTE — Patient Instructions (Signed)
Handout given for diverticulosis.  YOU HAD AN ENDOSCOPIC PROCEDURE TODAY AT [facility]:   Refer to the procedure report that was given to you for any specific questions about what was found during the examination.  If the procedure report does not answer your questions, please call your gastroenterologist to clarify.  If you requested that your care partner not be given the details of your procedure findings, then the procedure report has been included in a sealed envelope for you to review at your convenience later.  YOU SHOULD EXPECT: Some feelings of bloating in the abdomen. Passage of more gas than usual.  Walking can help get rid of the air that was put into your GI tract during the procedure and reduce the bloating. If you had a lower endoscopy (such as a colonoscopy or flexible sigmoidoscopy) you may notice spotting of blood in your stool or on the toilet paper. If you underwent a bowel prep for your procedure, you may not have a normal bowel movement for a few days.  Please Note:  You might notice some irritation and congestion in your nose or some drainage.  This is from the oxygen used during your procedure.  There is no need for concern and it should clear up in a day or so.  SYMPTOMS TO REPORT IMMEDIATELY:   Following lower endoscopy (colonoscopy or flexible sigmoidoscopy):  Excessive amounts of blood in the stool  Significant tenderness or worsening of abdominal pains  Swelling of the abdomen that is new, acute  Fever of 100F or higher  For urgent or emergent issues, a gastroenterologist can be reached at any hour by calling [phone]. Do not use MyChart messaging for urgent concerns.    DIET:  We do recommend a small meal at first, but then you may proceed to your regular diet.  Drink plenty of fluids but you should avoid alcoholic beverages for 24 hours.  ACTIVITY:  You should plan to take it easy for the rest of today and you should NOT DRIVE or use  heavy machinery until tomorrow (because of the sedation medicines used during the test).    FOLLOW UP: Our staff will call the number listed on your records 48-72 hours following your procedure to check on you and address any questions or concerns that you may have regarding the information given to you following your procedure. If we do not reach you, we will leave a message.  We will attempt to reach you two times.  During this call, we will ask if you have developed any symptoms of COVID 19. If you develop any symptoms (ie: fever, flu-like symptoms, shortness of breath, cough etc.) before then, please call [phone].  If you test positive for Covid 19 in the 2 weeks post procedure, please call and report this information to [place].    If any biopsies were taken you will be contacted by phone or by letter within the next 1-3 weeks.  Please call us at [phone] if you have not heard about the biopsies in 3 weeks.    SIGNATURES/CONFIDENTIALITY: You and/or your care partner have signed paperwork which will be entered into your electronic medical record.  These signatures attest to the fact that that the information above on your After Visit Summary has been reviewed and is understood.  Full responsibility of the confidentiality of this discharge information lies with you and/or your care-partner.

## 2020-08-16 NOTE — Progress Notes (Signed)
PT taken to PACU. Monitors in place. VSS. Report given to RN. 

## 2020-08-16 NOTE — Progress Notes (Signed)
Pt's states no medical or surgical changes since previsit or office visit.  CHECK-IN-AER  VITAL SIGNS-SM

## 2020-08-16 NOTE — Op Note (Signed)
[place] Patient Name: Diane Freeman Procedure Date: 08/16/2020 9:37 AM MRN: [id number] Endoscopist: Remo Lipps P. Havery Moros , MD Age: 75 Referring MD:  Date of Birth: Aug 04, 1945 Gender: Female Account #: [id number] Procedure:                Colonoscopy Indications:              High risk colon cancer surveillance: Crohn's                            colitis - on mesalamine with good control of                            symptoms, history of pseudoinflammatory polyps Medicines:                Monitored Anesthesia Care Procedure:                Pre-Anesthesia Assessment:                           - Prior to the procedure, a History and Physical                            was performed, and patient medications and                            allergies were reviewed. The patient's tolerance of                            previous anesthesia was also reviewed. The risks                            and benefits of the procedure and the sedation                            options and risks were discussed with the patient.                            All questions were answered, and informed consent                            was obtained. Prior Anticoagulants: The patient has                            taken no previous anticoagulant or antiplatelet                            agents. ASA Grade Assessment: III - A patient with                            severe systemic disease. After reviewing the risks                            and benefits, the patient was deemed in  satisfactory condition to undergo the procedure.                           After obtaining informed consent, the colonoscope                            was passed under direct vision. Throughout the                            procedure, the patient's blood pressure, pulse, and                            oxygen saturations were monitored continuously. The                            Olympus  [id number]-[id number]) Colonoscope was                            introduced through the anus and advanced to the the                            terminal ileum, with identification of the                            appendiceal orifice and IC valve. The colonoscopy                            was performed without difficulty. The patient                            tolerated the procedure well. The quality of the                            bowel preparation was adequate. The terminal ileum,                            ileocecal valve, appendiceal orifice, and rectum                            were photographed. Scope In: 9:47:11 AM Scope Out: 10:08:42 AM Scope Withdrawal Time: 0 hours 16 minutes 11 seconds  Total Procedure Duration: 0 hours 21 minutes 31 seconds  Findings:                 The perianal and digital rectal examinations were                            normal.                           The terminal ileum appeared normal.                           Diffuse pseudopolyps were found in the sigmoid  colon, in the descending colon and in the                            transverse colon. No overtly adenomatous polyps                            appreciated. Multiple pseudopolyps removed.                           Multiple diverticula were found in the transverse                            colon and left colon.                           The exam was otherwise without abnormality. No                            active inflammation appreciated. Prep was adequate                            but several minutes spent lavaging the colon to                            achieve adequate views.                           Biopsies were taken with a cold forceps from the                            right colon, left colon, transverse colon and                            rectum. These biopsy specimens were sent to                            Pathology. Complications:            No  immediate complications. Estimated blood loss:                            Minimal. Estimated Blood Loss:     Estimated blood loss was minimal. Impression:               - The examined portion of the ileum was normal.                           - Pseudopolyps in the sigmoid colon, in the                            descending colon and in the transverse colon.                           - Diverticulosis in the transverse colon and in the  left colon.                           - The examination was otherwise normal.                           - Biopsies for surveillance were taken from the                            right colon, left colon, transverse colon and                            rectum. Recommendation:           - Patient has a contact number available for                            emergencies. The signs and symptoms of potential                            delayed complications were discussed with the                            patient. Return to normal activities tomorrow.                            Written discharge instructions were provided to the                            patient.                           - Resume previous diet.                           - Continue present medications.                           - Await pathology results. Remo Lipps P. Bernedette Auston, MD 08/16/2020 10:15:59 AM This report has been signed electronically.

## 2020-08-16 NOTE — Progress Notes (Signed)
Called to room to assist during endoscopic procedure.  Patient ID and intended procedure confirmed with present staff. Received instructions for my participation in the procedure from the performing physician.  

## 2020-08-18 ENCOUNTER — Telehealth: Payer: Self-pay

## 2020-08-18 NOTE — Telephone Encounter (Signed)
  Follow up Call-  Call back number 08/16/2020 02/18/2019 05/20/2018  Post procedure Call Back phone  # [phone] - house [phone] House  Permission to leave phone message Yes Yes Yes  Some recent data might be hidden     Patient questions:  Do you have a fever, pain , or abdominal swelling? No. Pain Score  0 *  Have you tolerated food without any problems? Yes.    Have you been able to return to your normal activities? Yes.    Do you have any questions about your discharge instructions: Diet   No. Medications  No. Follow up visit  No.  Do you have questions or concerns about your Care? No.  Actions: * If pain score is 4 or above: No action needed, pain <4.  1. Have you developed a fever since your procedure? no  2.   Have you had an respiratory symptoms (SOB or cough) since your procedure? no  3.   Have you tested positive for COVID 19 since your procedure no  4.   Have you had any family members/close contacts diagnosed with the COVID 19 since your procedure?  no   If yes to any of these questions please route to Joylene John, RN and Joella Prince, RN

## 2020-09-18 ENCOUNTER — Other Ambulatory Visit: Payer: Self-pay | Admitting: Gastroenterology

## 2020-09-19 IMAGING — CR DG CHEST 2V
2 series · 2 of 2 positions shown · non-contrast
Comparison: Chest x-ray 06/12/2013.

CLINICAL DATA: 74-year-old female with history of productive cough.

EXAM:
CHEST - 2 VIEW

[w chest pa]
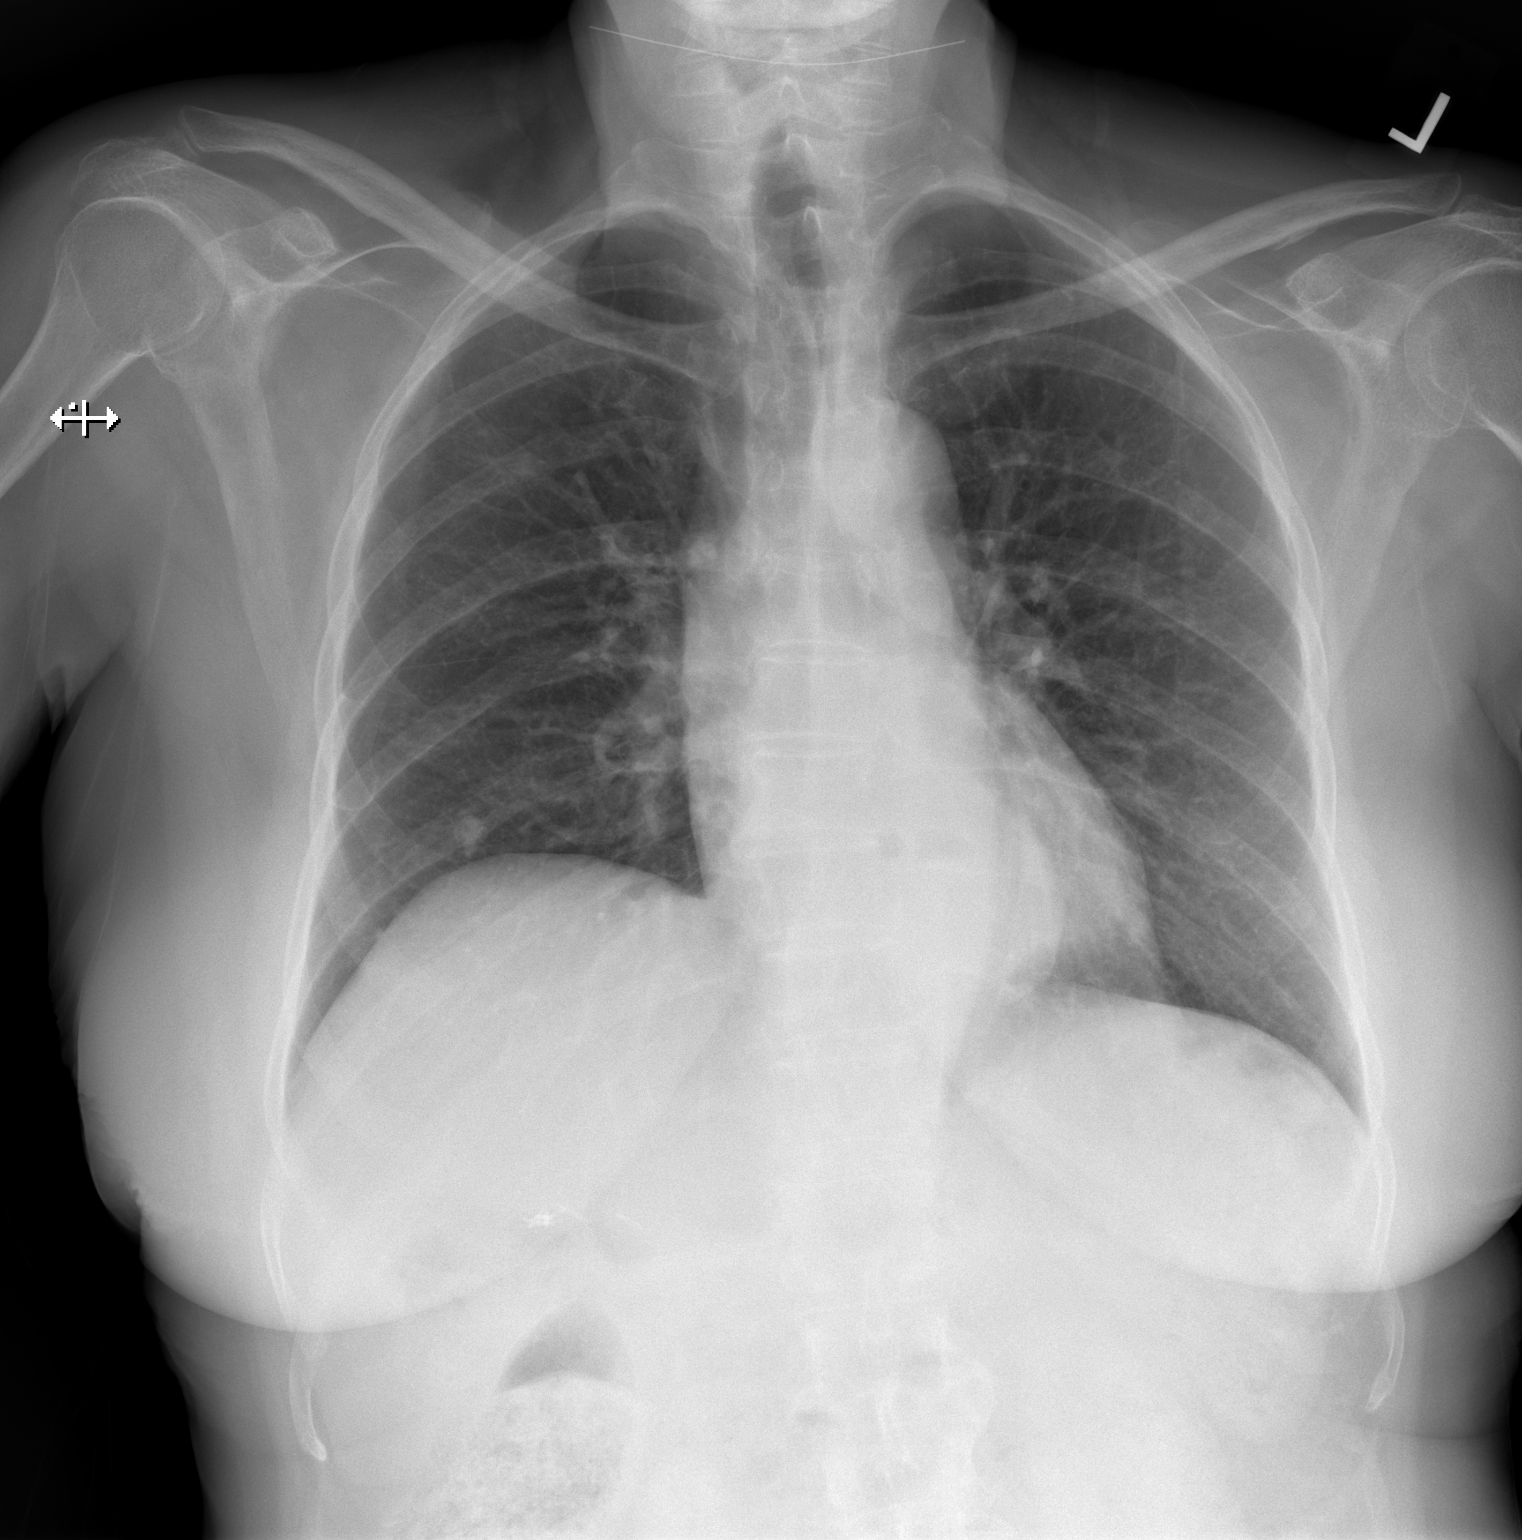

[w chest lat]
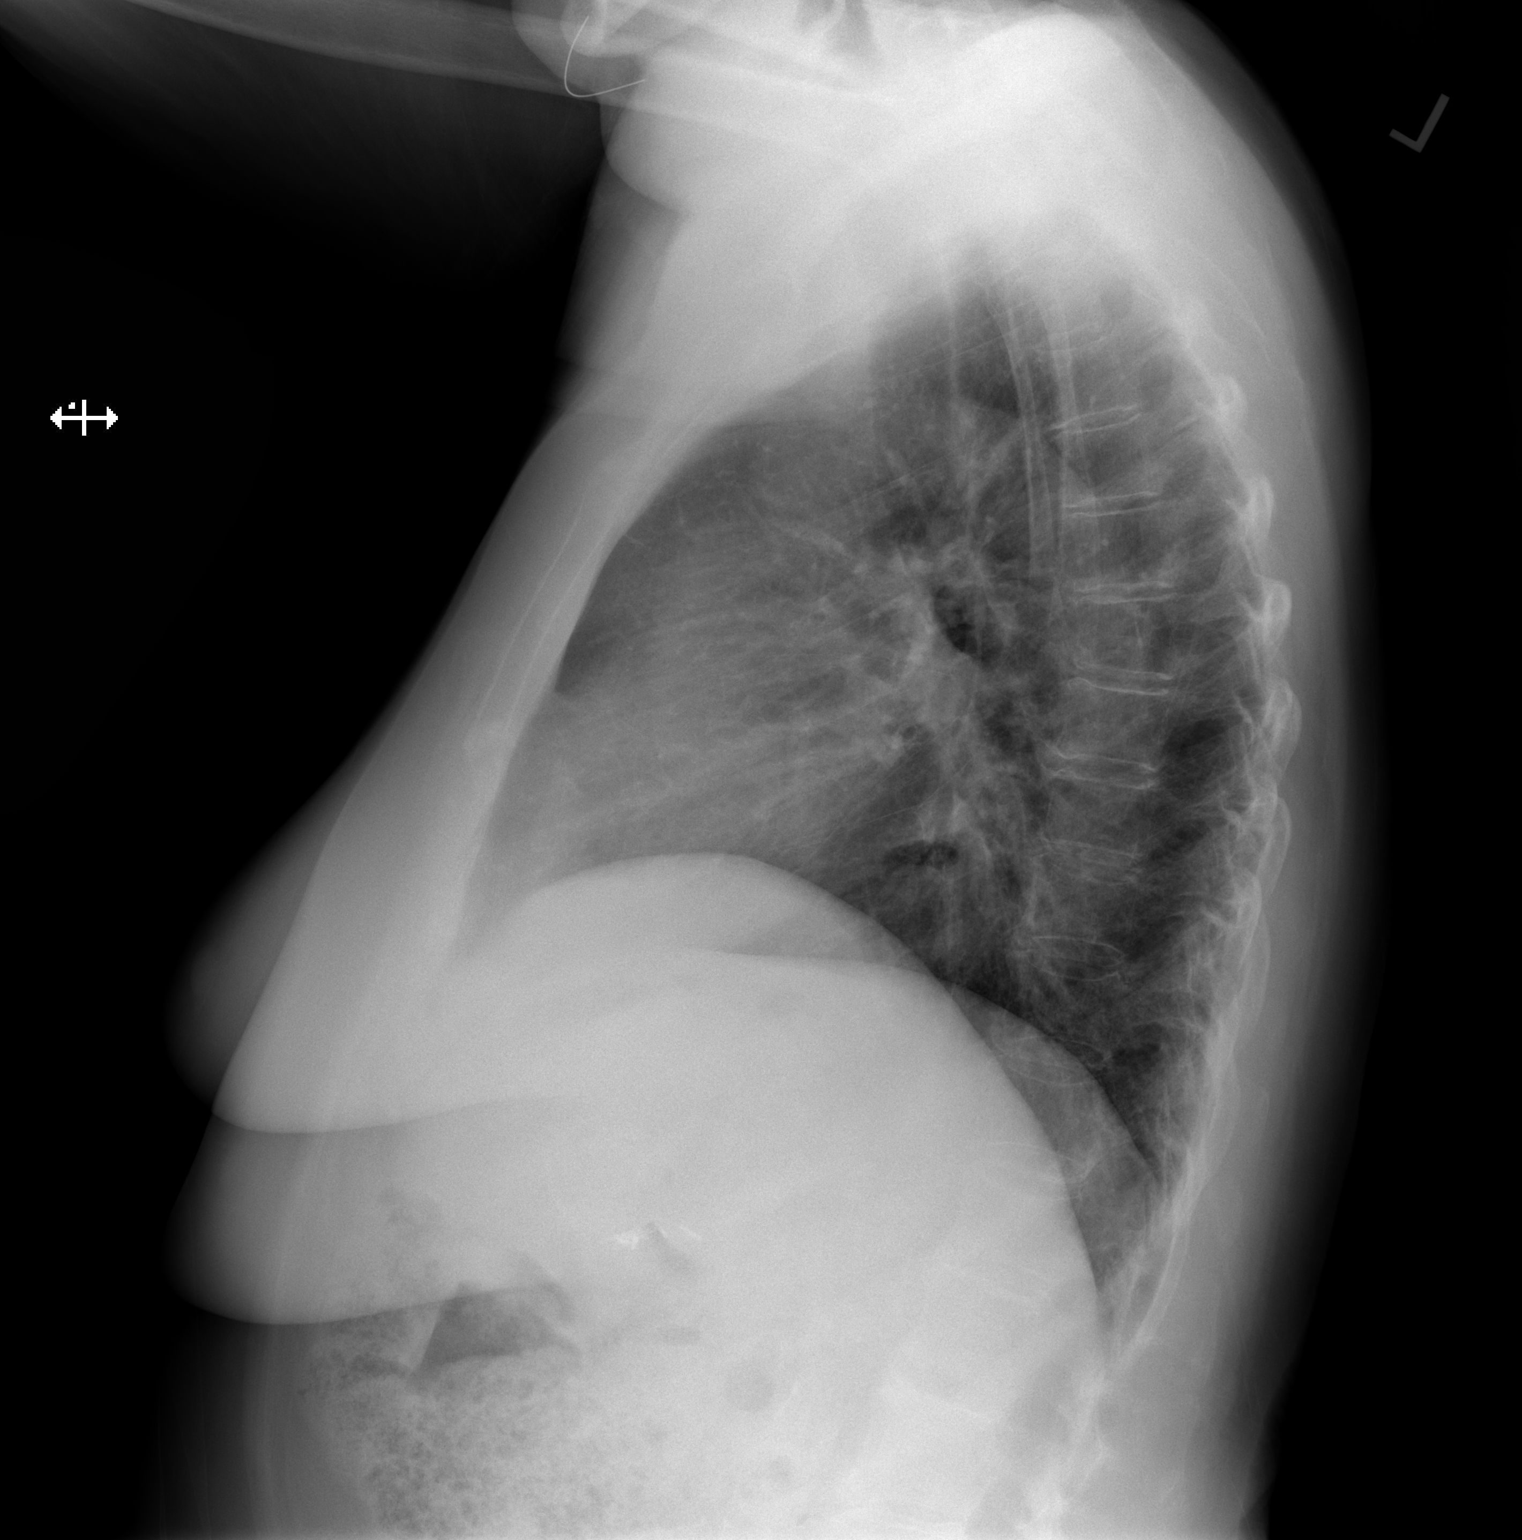

[2 of 2 positions shown; findings below may reference images not displayed]

FINDINGS: Lung volumes are normal. No consolidative airspace disease. No
pleural effusions. No pneumothorax. Small calcification projecting
over the lower right hemithorax corresponding to a calcified density
in the right breast on prior chest CT 06/13/2013. No other
suspicious appearing pulmonary nodule or mass noted. Pulmonary
vasculature and the cardiomediastinal silhouette are within normal
limits. Surgical clips project over the right upper quadrant of the
abdomen, likely from prior cholecystectomy.
IMPRESSION: 1.  No radiographic evidence of acute cardiopulmonary disease.

## 2020-11-28 ENCOUNTER — Other Ambulatory Visit: Payer: Self-pay | Admitting: Gastroenterology

## 2020-12-15 ENCOUNTER — Other Ambulatory Visit: Payer: Self-pay | Admitting: Gastroenterology

## 2021-02-04 DIAGNOSIS — R051 Acute cough: Secondary | ICD-10-CM | POA: Diagnosis not present

## 2021-02-16 ENCOUNTER — Other Ambulatory Visit: Payer: Self-pay

## 2021-02-16 ENCOUNTER — Ambulatory Visit (INDEPENDENT_AMBULATORY_CARE_PROVIDER_SITE_OTHER): Payer: Medicare Other | Admitting: Family Medicine

## 2021-02-16 VITALS — BP 168/68 | HR 71 | Temp 97.5°F | Ht 62.0 in

## 2021-02-16 DIAGNOSIS — E785 Hyperlipidemia, unspecified: Secondary | ICD-10-CM

## 2021-02-16 DIAGNOSIS — I5032 Chronic diastolic (congestive) heart failure: Secondary | ICD-10-CM

## 2021-02-16 DIAGNOSIS — R059 Cough, unspecified: Secondary | ICD-10-CM

## 2021-02-16 DIAGNOSIS — R030 Elevated blood-pressure reading, without diagnosis of hypertension: Secondary | ICD-10-CM

## 2021-02-16 DIAGNOSIS — R058 Other specified cough: Secondary | ICD-10-CM

## 2021-02-16 NOTE — Progress Notes (Signed)
    SUBJECTIVE:   CHIEF COMPLAINT / HPI: cough and congestion  Seen in Urgent Care 2 weeks ago for similar symptoms.  Was treated with Promethazine-Dm cough syrup.  Continues to have cough but reports has slightly improved but remains worse at night.  Cough is productive of yellow phlegm.  Denies any chest pain, shortness of breath, fevers, nausea.  Vomiting with cough, NBNB emesis.  Denies any recent travel or sick contacts.  PERTINENT  PMH / PSH:  Hyperlipidemia Esophageal Stricture Hx Gastritis  OBJECTIVE:   BP (!) 168/68   Pulse 71   Temp (!) 97.5 F (36.4 C)   Ht 5' 2"  (1.575 m)   SpO2 99%   BMI 28.35 kg/m    General: Alert, no acute distress Cardio: Normal S1 and S2, RRR, no r/m/g Pulm: CTAB, normal work of breathing Abdomen: Bowel sounds normal. Abdomen soft and non-tender.  Extremities: No peripheral edema.   ASSESSMENT/PLAN:   Post-viral cough syndrome Likely viral etiology given cough is improving.  Offered COVID/Flu testing, while low suspicion, patient declined.   -Chest xray and CBC to r/o underlying etiology of 2 weeks of cough -Symptomatic treatment -Strict return precautions provided -Follow up with PCP as needed or sooner if symptoms do not improve   Hyperlipidemia Elevated lipid panel. ASCVD 10 yr risk 27.6%.  Discussed with patient lifestyle management Follow up with PCP to discuss further management  Elevated blood pressure reading SBP elevated today.  Given patient with viral illness will have her come back to follow up with PCP to repeat BP and treatment if appropriate     Carollee Leitz, MD [place]

## 2021-02-16 NOTE — Patient Instructions (Signed)
Thank you for coming to see me today. It was a pleasure. Today we talked about:   We will get some labs today.  If they are abnormal or we need to do something about them, I will call you.  If they are normal, I will send you a message on MyChart (if it is active) or a letter in the mail.  If you don't hear from [place] in 2 weeks, please call the office at the number below.   I have placed an order for chest xray.  Please go to [place] at [employer] or at [facility] to have this completed.  You do not need an appointment, but if you would like to call them beforehand, their number is [phone].  We will contact you with your results afterwards.   Please follow-up with PCP as needed  If you have any questions or concerns, please do not hesitate to call the office at [phone].  Best,   Carollee Leitz, MD

## 2021-02-17 ENCOUNTER — Other Ambulatory Visit: Payer: Self-pay

## 2021-02-17 ENCOUNTER — Ambulatory Visit (INDEPENDENT_AMBULATORY_CARE_PROVIDER_SITE_OTHER): Payer: Medicare Other

## 2021-02-17 ENCOUNTER — Ambulatory Visit
Admission: RE | Admit: 2021-02-17 | Discharge: 2021-02-17 | Disposition: A | Discharge status: Home / Self Care | Payer: Medicare Other | Source: Ambulatory Visit | Attending: Family Medicine | Admitting: Family Medicine

## 2021-02-17 DIAGNOSIS — R059 Cough, unspecified: Secondary | ICD-10-CM

## 2021-02-17 DIAGNOSIS — E785 Hyperlipidemia, unspecified: Secondary | ICD-10-CM | POA: Diagnosis not present

## 2021-02-17 DIAGNOSIS — I5032 Chronic diastolic (congestive) heart failure: Secondary | ICD-10-CM | POA: Diagnosis not present

## 2021-02-18 LAB — COMPREHENSIVE METABOLIC PANEL
ALT: 14 IU/L (ref 0–32)
AST: 19 IU/L (ref 0–40)
Albumin/Globulin Ratio: 1.3 (ref 1.2–2.2)
Albumin: 4.1 g/dL (ref 3.7–4.7)
Alkaline Phosphatase: 104 IU/L (ref 44–121)
BUN/Creatinine Ratio: 9 — ABNORMAL LOW (ref 12–28)
BUN: 9 mg/dL (ref 8–27)
Bilirubin Total: <0.2 mg/dL (ref 0.0–1.2)
CO2: 20 mmol/L (ref 20–29)
Calcium: 9.3 mg/dL (ref 8.7–10.3)
Chloride: 101 mmol/L (ref 96–106)
Creatinine, Ser: 0.95 mg/dL (ref 0.57–1.00)
Globulin, Total: 3.1 g/dL (ref 1.5–4.5)
Glucose: 97 mg/dL (ref 70–99)
Potassium: 5 mmol/L (ref 3.5–5.2)
Sodium: 136 mmol/L (ref 134–144)
Total Protein: 7.2 g/dL (ref 6.0–8.5)
eGFR: 62 mL/min/{1.73_m2} (ref >59)

## 2021-02-18 LAB — CBC WITH DIFFERENTIAL/PLATELET
Basophils Absolute: 0.1 x10E3/uL (ref 0.0–0.2)
Basos: 1 %
EOS (ABSOLUTE): 0.3 x10E3/uL (ref 0.0–0.4)
Eos: 3 %
Hematocrit: 39.1 % (ref 34.0–46.6)
Hemoglobin: 12.5 g/dL (ref 11.1–15.9)
Immature Grans (Abs): 0.1 x10E3/uL (ref 0.0–0.1)
Immature Granulocytes: 1 %
Lymphocytes Absolute: 2.3 x10E3/uL (ref 0.7–3.1)
Lymphs: 21 %
MCH: 25 pg — ABNORMAL LOW (ref 26.6–33.0)
MCHC: 32 g/dL (ref 31.5–35.7)
MCV: 78 fL — ABNORMAL LOW (ref 79–97)
Monocytes Absolute: 1.1 x10E3/uL — ABNORMAL HIGH (ref 0.1–0.9)
Monocytes: 10 %
Neutrophils Absolute: 7 x10E3/uL (ref 1.4–7.0)
Neutrophils: 64 %
Platelets: 311 x10E3/uL (ref 150–450)
RBC: 5 x10E6/uL (ref 3.77–5.28)
RDW: 15.7 % — ABNORMAL HIGH (ref 11.7–15.4)
WBC: 10.9 x10E3/uL — ABNORMAL HIGH (ref 3.4–10.8)

## 2021-02-18 LAB — LIPID PANEL
Chol/HDL Ratio: 6.6 ratio — ABNORMAL HIGH (ref 0.0–4.4)
Cholesterol, Total: 205 mg/dL — ABNORMAL HIGH (ref 100–199)
HDL: 31 mg/dL — ABNORMAL LOW (ref >39)
LDL Chol Calc (NIH): 118 mg/dL — ABNORMAL HIGH (ref 0–99)
Triglycerides: 316 mg/dL — ABNORMAL HIGH (ref 0–149)
VLDL Cholesterol Cal: 56 mg/dL — ABNORMAL HIGH (ref 5–40)

## 2021-02-21 ENCOUNTER — Encounter: Payer: Self-pay | Admitting: Family Medicine

## 2021-02-21 DIAGNOSIS — R030 Elevated blood-pressure reading, without diagnosis of hypertension: Secondary | ICD-10-CM | POA: Insufficient documentation

## 2021-02-21 DIAGNOSIS — R058 Other specified cough: Secondary | ICD-10-CM | POA: Insufficient documentation

## 2021-02-21 HISTORY — DX: Elevated blood-pressure reading, without diagnosis of hypertension: R03.0

## 2021-02-21 NOTE — Assessment & Plan Note (Signed)
SBP elevated today.  Given patient with viral illness will have her come back to follow up with PCP to repeat BP and treatment if appropriate

## 2021-02-21 NOTE — Assessment & Plan Note (Addendum)
Likely viral etiology given cough is improving.  Offered COVID/Flu testing, while low suspicion, patient declined.   -Chest xray and CBC to r/o underlying etiology of 2 weeks of cough -Symptomatic treatment -Strict return precautions provided -Follow up with PCP as needed or sooner if symptoms do not improve

## 2021-02-21 NOTE — Assessment & Plan Note (Signed)
Elevated lipid panel. ASCVD 10 yr risk 27.6%.  Discussed with patient lifestyle management Follow up with PCP to discuss further management

## 2021-03-09 ENCOUNTER — Other Ambulatory Visit: Payer: Self-pay | Admitting: Gastroenterology

## 2021-05-27 ENCOUNTER — Other Ambulatory Visit: Payer: Self-pay | Admitting: Gastroenterology

## 2021-05-31 ENCOUNTER — Other Ambulatory Visit: Payer: Self-pay | Admitting: Gastroenterology

## 2021-06-02 ENCOUNTER — Other Ambulatory Visit: Payer: Self-pay | Admitting: Gastroenterology

## 2021-06-27 ENCOUNTER — Other Ambulatory Visit: Payer: Self-pay | Admitting: Gastroenterology

## 2021-07-12 ENCOUNTER — Encounter: Payer: Self-pay | Admitting: Family Medicine

## 2021-07-12 ENCOUNTER — Ambulatory Visit (INDEPENDENT_AMBULATORY_CARE_PROVIDER_SITE_OTHER): Payer: Medicare Other | Admitting: Family Medicine

## 2021-07-12 VITALS — BP 159/97 | HR 73 | Ht 62.0 in | Wt 156.4 lb

## 2021-07-12 DIAGNOSIS — L089 Local infection of the skin and subcutaneous tissue, unspecified: Secondary | ICD-10-CM

## 2021-07-12 DIAGNOSIS — Z23 Encounter for immunization: Secondary | ICD-10-CM | POA: Diagnosis not present

## 2021-07-12 MED ORDER — MUPIROCIN 2 % EX OINT: 1.0000 "application " | TOPICAL_OINTMENT | Freq: Two times a day (BID) | CUTANEOUS | 0 refills | Status: DC

## 2021-07-12 NOTE — Patient Instructions (Addendum)
It was a pleasure seeing you today.  I am concerned that you have an infection in the skin on your forehead.  I have sent a prescription for a medicine called mupirocin which she will apply twice daily until the wound resolves.  If it gets worse or stays the same in the next few weeks or if you have symptoms such as fevers I want you to be seen immediately.  We have updated your tetanus vaccine today.  If you have any questions or concerns call the clinic.  I hope you have a wonderful day! ? ?

## 2021-07-12 NOTE — Progress Notes (Signed)
? ? ?  SUBJECTIVE:  ? ?CHIEF COMPLAINT / HPI:  ? ?Wound infection ?Patient reports that she was brushing her hair approximately 2 weeks ago and accidentally cut her forehead with a comb.  She reports there was a scrape which is turned into a wound and it is now draining and tender.  Denies any fever or systemic symptoms.  Has been putting a medication on it but she is not sure the name of that is supposed to help with scarring but no other treatment. ? ?OBJECTIVE:  ? ?BP (!) 159/97   Pulse 73   Ht 5' 2"  (1.575 m)   Wt 156 lb 6.4 oz (70.9 kg)   SpO2 100%   BMI 28.61 kg/m?   ?General: Pleasant 76 year old female in no acute distress ?Cardiac: Regular rate and rhythm respiratory: Normal for breathing, speaking in full sentences ?Abdomen: Soft, nontender ?Derm: Patient with lesion to forehead with erythema and yellow crusting ? ? ? ?ASSESSMENT/PLAN:  ? ?Soft tissue infection ?Patient presents for infection on her forehead after scraping with a comb 2 weeks ago.  No systemic symptoms.  Infection seems localized.  Appears to be impetigo.  Will treat with mupirocin.  Have updated patient's tetanus vaccine today.  Provided strict return precautions. ?  ? ? ?Gifford Shave, MD ?[place]  ? ?

## 2021-07-12 NOTE — Assessment & Plan Note (Addendum)
Patient presents for infection on her forehead after scraping with a comb 2 weeks ago.  No systemic symptoms.  Infection seems localized.  Appears to be impetigo.  Will treat with mupirocin.  Have updated patient's tetanus vaccine today.  Provided strict return precautions. ?

## 2021-07-25 ENCOUNTER — Other Ambulatory Visit: Payer: Self-pay | Admitting: Gastroenterology

## 2021-08-03 ENCOUNTER — Other Ambulatory Visit: Payer: Self-pay | Admitting: Gastroenterology

## 2021-08-27 ENCOUNTER — Other Ambulatory Visit: Payer: Self-pay | Admitting: Gastroenterology

## 2021-09-05 ENCOUNTER — Encounter: Payer: Self-pay | Admitting: *Deleted

## 2021-09-14 NOTE — Progress Notes (Signed)
Lab visit scheduled as office visit, unable to sign encounter due to security clearance in epic. Will forward to MD to sign chart.

## 2021-09-14 NOTE — Progress Notes (Signed)
Error

## 2021-09-20 ENCOUNTER — Other Ambulatory Visit: Payer: Self-pay | Admitting: Gastroenterology

## 2021-09-26 ENCOUNTER — Telehealth: Payer: Self-pay | Admitting: Gastroenterology

## 2021-09-26 MED ORDER — OMEPRAZOLE 20 MG PO CPDR: DELAYED_RELEASE_CAPSULE | ORAL | 0 refills | Status: DC

## 2021-10-19 ENCOUNTER — Other Ambulatory Visit: Payer: Self-pay | Admitting: Gastroenterology

## 2021-11-13 ENCOUNTER — Other Ambulatory Visit: Payer: Self-pay | Admitting: Gastroenterology

## 2021-11-13 ENCOUNTER — Ambulatory Visit (INDEPENDENT_AMBULATORY_CARE_PROVIDER_SITE_OTHER): Payer: Medicare Other | Admitting: Gastroenterology

## 2021-11-13 ENCOUNTER — Ambulatory Visit: Payer: Medicare Other | Admitting: Gastroenterology

## 2021-11-13 ENCOUNTER — Encounter: Payer: Self-pay | Admitting: Gastroenterology

## 2021-11-13 VITALS — BP 130/64 | HR 67 | Ht 63.0 in | Wt 153.8 lb

## 2021-11-13 DIAGNOSIS — K219 Gastro-esophageal reflux disease without esophagitis: Secondary | ICD-10-CM

## 2021-11-13 DIAGNOSIS — Z79899 Other long term (current) drug therapy: Secondary | ICD-10-CM | POA: Diagnosis not present

## 2021-11-13 DIAGNOSIS — K5010 Crohn's disease of large intestine without complications: Secondary | ICD-10-CM | POA: Diagnosis not present

## 2021-11-13 MED ORDER — MESALAMINE ER 0.375 G PO CP24
1.5000 g | ORAL_CAPSULE | Freq: Every day | ORAL | 3 refills | Status: DC
Start: 2021-11-13 — End: 2022-11-07

## 2021-11-13 MED ORDER — OMEPRAZOLE 20 MG PO CPDR: DELAYED_RELEASE_CAPSULE | ORAL | 3 refills | Status: DC

## 2021-11-13 NOTE — Patient Instructions (Signed)
We have sent the following medications to your pharmacy for you to pick up at your convenience: Apriso Omeprazole  You will be due for a recall colonoscopy in 08/2022. We will send you a reminder in the mail when it gets closer to that time.  _______________________________________________________  If you are age 77 or older, your body mass index should be between 23-30. Your Body mass index is 27.24 kg/m. If this is out of the aforementioned range listed, please consider follow up with your Primary Care Provider.  If you are age 83 or younger, your body mass index should be between 19-25. Your Body mass index is 27.24 kg/m. If this is out of the aformentioned range listed, please consider follow up with your Primary Care Provider.   ________________________________________________________  The [facility] providers would like to encourage you to use [facility] to communicate with providers for non-urgent requests or questions.  Due to long hold times on the telephone, sending your provider a message by [facility] may be a faster and more efficient way to get a response.  Please allow 48 business hours for a response.  Please remember that this is for non-urgent requests.  _______________________________________________________  Due to recent changes in healthcare laws, you may see the results of your imaging and laboratory studies on MyChart before your provider has had a chance to review them.  We understand that in some cases there may be results that are confusing or concerning to you. Not all laboratory results come back in the same time frame and the provider may be waiting for multiple results in order to interpret others.  Please give [place] 48 hours in order for your provider to thoroughly review all the results before contacting the office for clarification of your results.

## 2021-11-13 NOTE — Progress Notes (Signed)
HPI :  IBD HISTORY 76 y/o female: diagnosed with Crohns colitis since 2007. She has been on Apriso 1.5gm / day.  She has been on on Lialda previously and the patient thinks it caused her hair to fall out. She thinks when she stopped Lialda the hair loss issue stopped. She has not had any hair loss with Apriso.     SINCE LAST VISIT   76 year old female here for follow-up for her colitis.  I last saw her May 2022 for her colonoscopy.  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  She continues to do quite well on Apriso monotherapy.  She has not had any flares of her colitis since have last seen her.  She denies any problems with her bowel habits.  No blood in her stools.  No abdominal pains. We discussed that she has had disease for at least 16 years now, how often she wants to have colonoscopy exams, and when she wants to stop having surveillance exams.  Otherwise she is doing pretty well in regards to her reflux.  She takes omeprazole 20 mg every day and that typically works pretty well to control her symptoms and keep them at [place].  She does not have any significant breakthrough when she takes the medication but can have regurgitation at times, especially if she does not take her medicines.  She has mild dysphagia which she has had in the past, has a distal esophageal stricture and hiatal hernia, which was dilated in 2020 which helped her symptoms but they have since recurred over time but mild.  No progression since have last seen her, she wants to hold off on dilation if possible.      Prior endoscopic evaluation. Colonoscopy 08/16/2020: The perianal and digital rectal examinations were normal. - The terminal ileum appeared normal. - Diffuse pseudopolyps were found in the sigmoid colon, in the descending colon and in the transverse colon. No overtly adenomatous polyps appreciated.  Multiple pseudopolyps removed. - Multiple diverticula were found in the transverse colon and left colon. - The exam was otherwise without abnormality. No active inflammation appreciated. Prep was adequate but several minutes spent lavaging the colon to achieve adequate views. - Biopsies were taken with a cold forceps from the right colon, left colon, transverse colon and rectum. These biopsy specimens were sent to Pathology.  1. Surgical [P], right colon biopsies ( h/o colitis ) - BENIGN COLONIC MUCOSA. - NO ACTIVE INFLAMMATION. - NO DYSPLASIA OR MALIGNANCY. 2. Surgical [P], colon, transverse ( h/o colitis ) - BENIGN COLONIC MUCOSA. - NO ACTIVE INFLAMMATION. - NO DYSPLASIA OR MALIGNANCY. 3. Surgical [P], left colon biopsies ( h/o colitis ) - BENIGN COLONIC MUCOSA. - NO ACTIVE INFLAMMATION. - NO DYSPLASIA OR MALIGNANCY   EGD 05/20/18 -  - A 4 cm hiatal hernia was present. - One benign-appearing, intrinsic stenosis was found 33 cm from the incisors, entering into the hernia sac. This stenosis measured less than one cm (in length). There was a subtle change in the mucosa on initial inspection which appeared to be inflamed tissue from prolapse within the hernia sac. A TTS dilator was passed through the scope. Dilation with a 16-17-18 mm balloon dilator was performed to 16 mm, 17 mm and 18 mm after which a small mucosal wrent was noted. The stricture was fairly pliable however, dysphagia could be coming from the angulated turn entering the  hernia sac. Biopsies were taken with a cold forceps for histology to ensure benign. - The exam of the esophagus was otherwise normal. - The entire examined stomach was normal. - The duodenal bulb and second portion of the duodenum were normal.   Surgical [P], stricture site - CARDIAC-TYPE MUCOSA WITH MILD NON-SPECIFIC INFLAMMATION. - NEGATIVE FOR INTESTINAL METAPLASIA, DYSPLASIA OR MALIGNANCY.       Colonoscopy 02/18/19 - The perianal and  digital rectal examinations were normal. - The terminal ileum appeared normal. - Multiple medium-mouthed diverticula were found in the sigmoid colon, descending colon and transverse colon. - Patchy pseudopolyps were found in the descending colon, in the transverse colon and in the ascending colon, many of these were biopsied as below. - The exam was otherwise without abnormality. No suspected adenomas appreciated. - Biopsies were taken with a cold forceps from the right colon, left colon and transverse colon. These biopsy specimens were sent to Pathology.   1. Surgical [P], right colon - CHRONIC COLITIS WITH FOCAL ACTIVITY - NO GRANULOMATA, DYSPLASIA OR MALIGNANCY IDENTIFIED 2. Surgical [P], colon, transverse - CHRONIC MILDLY ACTIVE COLITIS - NO GRANULOMATA, DYSPLASIA OR MALIGNANCY IDENTIFIED 3. Surgical [P], left colon - CHRONIC MILDLY ACTIVE COLITIS - NO GRANULOMATA, DYSPLASIA OR MALIGNANCY IDENTIFIED     Colonoscopy 04/29/15 - mild erythema / inflammatory changes of the left colon, inflammatory pseudopolyposis from the left colon through transverse colon - surveillance biopsies taken throughout the entire colon showing mildly active colitis, One 73m polyp removed via hot snare in the transverse colon c/w adenoma   Colonoscopy 04/26/2016 - splenic flexure adenoma, multiple transverse pseudoinflammatory polyps, normal colon otherwise - benign mucosa / quiscent colitis on path   Colonoscopy 09/03/2017 - terminal ileum normal, diverticulosis, pseudoinflammatory polyps splenic flexure and in the transverse colon, many of these were sampled with cold forceps, mild patchy erythema in the left colon. The exam was otherwise without abnormality / inflammation on direct and retroflexion views.   EGD 09/23/2007 - mid esophageal stricture, dilated to 184m    Past Medical History:  Diagnosis Date   Adrenal nodule (HCC)    Anemia    Blood transfusion without reported diagnosis    Cataract    bil  removed   Choledocholithiasis with obstruction    Colitis    Crohn's colitis ([place]/11/14   Diverticulosis    Fatty liver    GERD (gastroesophageal reflux disease)    Hiatal hernia    History of esophageal stricture    Hx of gastritis      Past Surgical History:  Procedure Laterality Date   CARDIAC CATHETERIZATION  05/2013   CATARACT EXTRACTION Bilateral    CHOLECYSTECTOMY  2009   COLONOSCOPY  last 09/03/2017, 2020   ERCP  06/02/2011   Procedure: ENDOSCOPIC RETROGRADE CHOLANGIOPANCREATOGRAPHY (ERCP);  Surgeon: CaGatha MayerMD;  [facility];  Service: Endoscopy;  Laterality: N/A;   LEFT AND RIGHT HEART CATHETERIZATION WITH CORONARY ANGIOGRAM N/A 06/15/2013   Procedure: LEFT AND RIGHT HEART CATHETERIZATION WITH CORONARY ANGIOGRAM- Shunt series;  Surgeon: MiBlane OharaMD;  [facility];  Service: Cardiovascular;  Laterality: N/A;   TUBAL LIGATION     UPPER GASTROINTESTINAL ENDOSCOPY     Family History  Problem Relation Age of Onset   Bone cancer Maternal Grandmother    Colon cancer Maternal Aunt [id number] Stomach cancer Sister [id number]     decsd   Breast cancer Sister    Esophageal cancer Neg Hx  Rectal cancer Neg Hx    Pancreatic cancer Neg Hx    Colon polyps Neg Hx    Social History   Tobacco Use   Smoking status: Never   Smokeless tobacco: Never  Vaping Use   Vaping Use: Never used  Substance Use Topics   Alcohol use: No    Alcohol/week: 0.0 standard drinks of alcohol   Drug use: No   Current Outpatient Medications  Medication Sig Dispense Refill   VITAMIN D, CHOLECALCIFEROL, PO Take 1 tablet by mouth every morning.      mesalamine (APRISO) 0.375 g 24 hr capsule Take 4 capsules (1.5 g total) by mouth daily. 360 capsule 3   mupirocin ointment (BACTROBAN) 2 % Apply 1 application. topically 2 (two) times daily. (Patient not taking: Reported on 11/13/2021) 30 g 0   omeprazole (PRILOSEC) 20 MG capsule TAKE 1 CAPSULE BY MOUTH EVERY DAY 90 capsule 3    No current facility-administered medications for this visit.   No Known Allergies   Review of Systems: All systems reviewed and negative except where noted in HPI.   Lab Results  Component Value Date   WBC 10.9 (H) 02/17/2021   HGB 12.5 02/17/2021   HCT 39.1 02/17/2021   MCV 78 (L) 02/17/2021   PLT 311 02/17/2021   Lab Results  Component Value Date   ALT 14 02/17/2021   AST 19 02/17/2021   ALKPHOS 104 02/17/2021   BILITOT <0.2 02/17/2021    Lab Results  Component Value Date   CREATININE 0.95 02/17/2021   BUN 9 02/17/2021   NA 136 02/17/2021   K 5.0 02/17/2021   CL 101 02/17/2021   CO2 20 02/17/2021     Physical Exam: BP 130/64 (BP Location: Right Arm, Patient Position: Sitting, Cuff Size: Normal)   Pulse 67   Ht 5' 3"  (1.6 m)   Wt 153 lb 12.8 oz (69.8 kg)   BMI 27.24 kg/m  Constitutional: Pleasant,well-developed, female in no acute distress. Neurological: Alert and oriented to person place and time. Skin: Skin is warm and dry. No rashes noted. Psychiatric: Normal mood and affect. Behavior is normal.   ASSESSMENT: 76 y.o. female here for assessment of the following  1. Crohn's disease of colon without complication ([place])   2. Gastroesophageal reflux disease, unspecified whether esophagitis present   3. Long-term current use of proton pump inhibitor therapy    Generally has done really well with management of her mild colitis with mesalamine monotherapy.  We have discussed her options in the past and she has wanted to continue her regimen.  She did not have any active inflammation on her last exam, no symptoms that bother her.  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  We discussed if and when she wanted to do another colonoscopy.  She does want to do another colonoscopy within the next year, we will see what that shows and she may consider another exam in her early 61s pending her course.  She will continue  mesalamine for now and call me in the interim with any issues.  I will see her after the new year for scheduling.  Otherwise doing quite well on omeprazole monotherapy which is controlling her reflux.  Known mild stricture that is responded to dilation in the past, mild intermittent dysphagia, she does not think worth repeating a dilation at this time.  We may consider EGD at the timing of her next colonoscopy based on how she  is feeling.  Otherwise continue omeprazole at lowest dose needed to control symptoms.  I discussed long-term risk benefits of PPIs with her and she understands.   PLAN: - tentatively plan next colonoscopy for May 2023 - refill Apriso, continue daily - refill omeprazole, continue lowest dose of PPI needed to control symptoms. Have discussed risks /benefits of long term PPI therapy - f/u 1 year otherwise  Jolly Mango, MD [facility]

## 2021-12-06 DIAGNOSIS — N390 Urinary tract infection, site not specified: Secondary | ICD-10-CM | POA: Diagnosis not present

## 2021-12-06 DIAGNOSIS — U071 COVID-19: Secondary | ICD-10-CM | POA: Diagnosis not present

## 2021-12-07 DIAGNOSIS — N390 Urinary tract infection, site not specified: Secondary | ICD-10-CM | POA: Diagnosis not present

## 2021-12-07 DIAGNOSIS — U071 COVID-19: Secondary | ICD-10-CM | POA: Diagnosis not present

## 2022-03-04 DIAGNOSIS — J209 Acute bronchitis, unspecified: Secondary | ICD-10-CM | POA: Diagnosis not present

## 2022-05-15 ENCOUNTER — Telehealth: Payer: Self-pay | Admitting: Family Medicine

## 2022-05-15 NOTE — Telephone Encounter (Signed)
I attempted to leave a message for patient to call back and schedule Medicare Annual Wellness Visit (AWV). No voice mail  Please offer to do virtually or by telephone.  Left office number and my jabber [phone].  AWVI eligible as of 06/01/2010  Please schedule at anytime with Nurse Health Advisor.

## 2022-05-29 ENCOUNTER — Encounter (HOSPITAL_COMMUNITY): Payer: Self-pay

## 2022-05-29 ENCOUNTER — Inpatient Hospital Stay (HOSPITAL_COMMUNITY)
Admission: EM | Admit: 2022-05-29 | Discharge: 2022-05-31 | DRG: 690 | Disposition: A | Discharge status: Home / Self Care | Payer: Medicare Other | Attending: Family Medicine | Admitting: Family Medicine

## 2022-05-29 ENCOUNTER — Other Ambulatory Visit: Payer: Self-pay

## 2022-05-29 ENCOUNTER — Emergency Department (HOSPITAL_COMMUNITY): Payer: Medicare Other

## 2022-05-29 DIAGNOSIS — K219 Gastro-esophageal reflux disease without esophagitis: Secondary | ICD-10-CM | POA: Diagnosis not present

## 2022-05-29 DIAGNOSIS — N12 Tubulo-interstitial nephritis, not specified as acute or chronic: Secondary | ICD-10-CM

## 2022-05-29 DIAGNOSIS — R0601 Orthopnea: Secondary | ICD-10-CM | POA: Diagnosis present

## 2022-05-29 DIAGNOSIS — Z803 Family history of malignant neoplasm of breast: Secondary | ICD-10-CM | POA: Diagnosis not present

## 2022-05-29 DIAGNOSIS — E876 Hypokalemia: Secondary | ICD-10-CM | POA: Diagnosis present

## 2022-05-29 DIAGNOSIS — R110 Nausea: Secondary | ICD-10-CM | POA: Diagnosis not present

## 2022-05-29 DIAGNOSIS — R0609 Other forms of dyspnea: Secondary | ICD-10-CM | POA: Diagnosis not present

## 2022-05-29 DIAGNOSIS — I441 Atrioventricular block, second degree: Secondary | ICD-10-CM | POA: Diagnosis present

## 2022-05-29 DIAGNOSIS — E860 Dehydration: Secondary | ICD-10-CM | POA: Diagnosis present

## 2022-05-29 DIAGNOSIS — R0600 Dyspnea, unspecified: Secondary | ICD-10-CM | POA: Diagnosis not present

## 2022-05-29 DIAGNOSIS — N281 Cyst of kidney, acquired: Secondary | ICD-10-CM | POA: Diagnosis not present

## 2022-05-29 DIAGNOSIS — R0602 Shortness of breath: Secondary | ICD-10-CM | POA: Diagnosis not present

## 2022-05-29 DIAGNOSIS — Z79899 Other long term (current) drug therapy: Secondary | ICD-10-CM | POA: Diagnosis not present

## 2022-05-29 DIAGNOSIS — R112 Nausea with vomiting, unspecified: Secondary | ICD-10-CM | POA: Diagnosis not present

## 2022-05-29 DIAGNOSIS — N200 Calculus of kidney: Secondary | ICD-10-CM | POA: Diagnosis not present

## 2022-05-29 DIAGNOSIS — K5900 Constipation, unspecified: Secondary | ICD-10-CM | POA: Diagnosis present

## 2022-05-29 DIAGNOSIS — K5010 Crohn's disease of large intestine without complications: Secondary | ICD-10-CM | POA: Diagnosis present

## 2022-05-29 DIAGNOSIS — K5730 Diverticulosis of large intestine without perforation or abscess without bleeding: Secondary | ICD-10-CM | POA: Diagnosis not present

## 2022-05-29 DIAGNOSIS — D509 Iron deficiency anemia, unspecified: Secondary | ICD-10-CM | POA: Diagnosis not present

## 2022-05-29 DIAGNOSIS — R9431 Abnormal electrocardiogram [ECG] [EKG]: Secondary | ICD-10-CM | POA: Diagnosis not present

## 2022-05-29 DIAGNOSIS — Z800 Family history of malignant neoplasm of digestive organs: Secondary | ICD-10-CM | POA: Diagnosis not present

## 2022-05-29 DIAGNOSIS — R109 Unspecified abdominal pain: Secondary | ICD-10-CM | POA: Diagnosis not present

## 2022-05-29 DIAGNOSIS — K760 Fatty (change of) liver, not elsewhere classified: Secondary | ICD-10-CM | POA: Diagnosis present

## 2022-05-29 DIAGNOSIS — R0902 Hypoxemia: Secondary | ICD-10-CM | POA: Diagnosis present

## 2022-05-29 DIAGNOSIS — I10 Essential (primary) hypertension: Secondary | ICD-10-CM | POA: Diagnosis not present

## 2022-05-29 HISTORY — DX: Tubulo-interstitial nephritis, not specified as acute or chronic: N12

## 2022-05-29 LAB — CBC WITH DIFFERENTIAL/PLATELET
Abs Immature Granulocytes: 0 10*3/uL (ref 0.00–0.07)
Basophils Absolute: 0 10*3/uL (ref 0.0–0.1)
Basophils Relative: 0 %
Eosinophils Absolute: 0.1 10*3/uL (ref 0.0–0.5)
Eosinophils Relative: 1 %
HCT: 39.8 % (ref 36.0–46.0)
Hemoglobin: 12.9 g/dL (ref 12.0–15.0)
Lymphocytes Relative: 5 %
Lymphs Abs: 0.6 10*3/uL — ABNORMAL LOW (ref 0.7–4.0)
MCH: 25.7 pg — ABNORMAL LOW (ref 26.0–34.0)
MCHC: 32.4 g/dL (ref 30.0–36.0)
MCV: 79.3 fL — ABNORMAL LOW (ref 80.0–100.0)
Monocytes Absolute: 0.9 10*3/uL (ref 0.1–1.0)
Monocytes Relative: 7 %
Neutro Abs: 10.6 10*3/uL — ABNORMAL HIGH (ref 1.7–7.7)
Neutrophils Relative %: 87 %
Platelets: 194 10*3/uL (ref 150–400)
RBC: 5.02 MIL/uL (ref 3.87–5.11)
RDW: 16 % — ABNORMAL HIGH (ref 11.5–15.5)
WBC: 12.2 10*3/uL — ABNORMAL HIGH (ref 4.0–10.5)
nRBC: 0 % (ref 0.0–0.2)
nRBC: 0 /100 WBC

## 2022-05-29 LAB — URINALYSIS, MICROSCOPIC (REFLEX): WBC, UA: >50 WBC/hpf (ref 0–5)

## 2022-05-29 LAB — URINALYSIS, ROUTINE W REFLEX MICROSCOPIC
Bilirubin Urine: NEGATIVE
Glucose, UA: NEGATIVE mg/dL
Ketones, ur: NEGATIVE mg/dL
Nitrite: NEGATIVE
Protein, ur: NEGATIVE mg/dL
Specific Gravity, Urine: 1.02 (ref 1.005–1.030)
pH: 5.5 (ref 5.0–8.0)

## 2022-05-29 LAB — BASIC METABOLIC PANEL
Anion gap: 13 (ref 5–15)
BUN: 11 mg/dL (ref 8–23)
CO2: 19 mmol/L — ABNORMAL LOW (ref 22–32)
Calcium: 9.3 mg/dL (ref 8.9–10.3)
Chloride: 101 mmol/L (ref 98–111)
Creatinine, Ser: 1.17 mg/dL — ABNORMAL HIGH (ref 0.44–1.00)
GFR, Estimated: 48 mL/min — ABNORMAL LOW (ref >60)
Glucose, Bld: 161 mg/dL — ABNORMAL HIGH (ref 70–99)
Potassium: 3.4 mmol/L — ABNORMAL LOW (ref 3.5–5.1)
Sodium: 133 mmol/L — ABNORMAL LOW (ref 135–145)

## 2022-05-29 MED ORDER — MESALAMINE 1.2 G PO TBEC
1.2000 g | DELAYED_RELEASE_TABLET | Freq: Every day | ORAL | Status: DC
  Administered 2022-05-30 – 2022-05-31 (×2): 1.2 g via ORAL
  Filled 2022-05-29 (×2): qty 1

## 2022-05-29 MED ORDER — SODIUM CHLORIDE 0.9 % IV SOLN
1.0000 g | Freq: Once | INTRAVENOUS | Status: AC
  Administered 2022-05-29: 1 g via INTRAVENOUS
  Filled 2022-05-29: qty 10

## 2022-05-29 MED ORDER — PANTOPRAZOLE SODIUM 40 MG PO TBEC
40.0000 mg | DELAYED_RELEASE_TABLET | Freq: Every day | ORAL | Status: DC
  Administered 2022-05-30 – 2022-05-31 (×2): 40 mg via ORAL
  Filled 2022-05-29 (×2): qty 1

## 2022-05-29 MED ORDER — SODIUM CHLORIDE 0.9 % IV BOLUS
1000.0000 mL | Freq: Once | INTRAVENOUS | Status: AC
  Administered 2022-05-29: 1000 mL via INTRAVENOUS

## 2022-05-29 MED ORDER — ENOXAPARIN SODIUM 40 MG/0.4ML IJ SOSY
40.0000 mg | PREFILLED_SYRINGE | Freq: Every day | INTRAMUSCULAR | Status: DC
  Administered 2022-05-30 – 2022-05-31 (×2): 40 mg via SUBCUTANEOUS
  Filled 2022-05-29 (×2): qty 0.4

## 2022-05-29 MED ORDER — FENTANYL CITRATE PF 50 MCG/ML IJ SOSY
50.0000 ug | PREFILLED_SYRINGE | Freq: Once | INTRAMUSCULAR | Status: AC
  Administered 2022-05-29: 50 ug via INTRAVENOUS
  Filled 2022-05-29: qty 1

## 2022-05-29 MED ORDER — ACETAMINOPHEN 325 MG PO TABS: 650.0000 mg | ORAL_TABLET | Freq: Four times a day (QID) | ORAL | Status: DC | PRN

## 2022-05-29 MED ORDER — ONDANSETRON HCL 4 MG/2ML IJ SOLN
4.0000 mg | Freq: Once | INTRAMUSCULAR | Status: AC
  Administered 2022-05-29: 4 mg via INTRAVENOUS
  Filled 2022-05-29: qty 2

## 2022-05-29 NOTE — ED Provider Notes (Signed)
Accepted handoff at shift change from [facility] Please see prior provider note for more detail.   Briefly: Patient is 77 y.o. presents to the ED complaining of dysuria.  She has had this for the past week as well as increased urinary frequency.  She began having suprapubic pain that is now radiating to her left flank intermittently which feels like her previous kidney infections versus kidney stones.  DDX: concern for pyelonephritis, nephrolithiasis, ureterolithiasis  Plan: Patient to be admitted to hospital for pyelonephritis if CT is negative for nephrolithiasis and septic stone.  IV antibiotics have already been ordered.   Results for orders placed or performed during the hospital encounter of [id number]  Basic metabolic panel  Result Value Ref Range   Sodium 133 (L) 135 - 145 mmol/L   Potassium 3.4 (L) 3.5 - 5.1 mmol/L   Chloride 101 98 - 111 mmol/L   CO2 19 (L) 22 - 32 mmol/L   Glucose, Bld 161 (H) 70 - 99 mg/dL   BUN 11 8 - 23 mg/dL   Creatinine, Ser 1.17 (H) 0.44 - 1.00 mg/dL   Calcium 9.3 8.9 - 10.3 mg/dL   GFR, Estimated 48 (L) >60 mL/min   Anion gap 13 5 - 15  CBC with Differential  Result Value Ref Range   WBC 12.2 (H) 4.0 - 10.5 K/uL   RBC 5.02 3.87 - 5.11 MIL/uL   Hemoglobin 12.9 12.0 - 15.0 g/dL   HCT 39.8 36.0 - 46.0 %   MCV 79.3 (L) 80.0 - 100.0 fL   MCH 25.7 (L) 26.0 - 34.0 pg   MCHC 32.4 30.0 - 36.0 g/dL   RDW 16.0 (H) 11.5 - 15.5 %   Platelets 194 150 - 400 K/uL   nRBC 0.0 0.0 - 0.2 %   Neutrophils Relative % 87 %   Neutro Abs 10.6 (H) 1.7 - 7.7 K/uL   Lymphocytes Relative 5 %   Lymphs Abs 0.6 (L) 0.7 - 4.0 K/uL   Monocytes Relative 7 %   Monocytes Absolute 0.9 0.1 - 1.0 K/uL   Eosinophils Relative 1 %   Eosinophils Absolute 0.1 0.0 - 0.5 K/uL   Basophils Relative 0 %   Basophils Absolute 0.0 0.0 - 0.1 K/uL   nRBC 0 0 /100 WBC   Abs Immature Granulocytes 0.00 0.00 - 0.07 K/uL  Urinalysis, Routine w reflex microscopic -Urine, Clean Catch  Result  Value Ref Range   Color, Urine YELLOW YELLOW   APPearance CLEAR CLEAR   Specific Gravity, Urine 1.020 1.005 - 1.030   pH 5.5 5.0 - 8.0   Glucose, UA NEGATIVE NEGATIVE mg/dL   Hgb urine dipstick SMALL (A) NEGATIVE   Bilirubin Urine NEGATIVE NEGATIVE   Ketones, ur NEGATIVE NEGATIVE mg/dL   Protein, ur NEGATIVE NEGATIVE mg/dL   Nitrite NEGATIVE NEGATIVE   Leukocytes,Ua LARGE (A) NEGATIVE  Urinalysis, Microscopic (reflex)  Result Value Ref Range   RBC / HPF 0-5 0 - 5 RBC/hpf   WBC, UA >50 0 - 5 WBC/hpf   Bacteria, UA FEW (A) NONE SEEN   Squamous Epithelial / HPF 0-5 0 - 5 /HPF   WBC Clumps PRESENT    Mucus PRESENT    CT Renal Stone Study  Result Date: 05/29/2022 CLINICAL DATA:  Left-sided flank pain since yesterday, nausea and vomiting EXAM: CT ABDOMEN AND PELVIS WITHOUT CONTRAST TECHNIQUE: Multidetector CT imaging of the abdomen and pelvis was performed following the standard protocol without IV contrast. RADIATION DOSE REDUCTION: This exam was  performed according to the departmental dose-optimization program which includes automated exposure control, adjustment of the mA and/or kV according to patient size and/or use of iterative reconstruction technique. COMPARISON:  06/18/2013 FINDINGS: Lower chest: No acute pleural or parenchymal lung disease. Moderate hiatal hernia. Hepatobiliary: Cholecystectomy. Unremarkable unenhanced appearance of the liver. No biliary duct dilation. Pancreas: Unremarkable unenhanced appearance. Spleen: Unremarkable unenhanced appearance. Adrenals/Urinary Tract: Simple 4.8 cm cyst lower pole right kidney does not require follow-up. No urinary tract calculi or obstructive uropathy within either kidney. The adrenals and bladder are unremarkable. Stomach/Bowel: No bowel obstruction or ileus. Diverticulosis of the sigmoid colon without evidence of diverticulitis. Moderate retained stool throughout the colon. No bowel wall thickening or inflammatory change.  Vascular/Lymphatic: Aortic atherosclerosis. No enlarged abdominal or pelvic lymph nodes. Reproductive: Uterus and bilateral adnexa are unremarkable. Other: No free fluid or free intraperitoneal gas. No abdominal wall hernia. Musculoskeletal: No acute or destructive bony lesions. Reconstructed images demonstrate no additional findings. IMPRESSION: 1. Sigmoid diverticulosis without diverticulitis. 2. No urinary tract calculi or obstructive uropathy. 3. Moderate fecal retention consistent with constipation. No obstruction or ileus. 4. Hiatal hernia. 5.  Aortic Atherosclerosis (ICD10-I70.0). Electronically Signed   By: Randa Ngo M.D.   On: 05/29/2022 20:36    Physical Exam  BP (!) 109/94   Pulse (!) 59   Temp 98.4 F (36.9 C) (Oral)   Resp (!) 24   SpO2 98%   Physical Exam Vitals and nursing note reviewed.  Constitutional:      General: She is not in acute distress.    Appearance: Normal appearance. She is not ill-appearing or diaphoretic.  Cardiovascular:     Rate and Rhythm: Normal rate and regular rhythm.  Pulmonary:     Effort: Pulmonary effort is normal.  Neurological:     Mental Status: She is alert. Mental status is at baseline.  Psychiatric:        Mood and Affect: Mood normal.        Behavior: Behavior normal.     Procedures  Procedures  ED Course / MDM    Medical Decision Making Amount and/or Complexity of Data Reviewed Labs: ordered. Radiology: ordered.  Risk Prescription drug management.   Patient CT scan without evidence of obstructive uropathy, nephrolithiasis, ureterolithiasis.  Patient has been started on Rocephin.  I requested consultation with family medicine and spoke with resident who agreed with hospital admission for IV antibiotics for treatment of pyelonephritis.         Pat Kocher, [place] 05/29/22 2111    Lacretia Leigh, MD 05/31/22 [phone]

## 2022-05-29 NOTE — ED Triage Notes (Signed)
Patient bib [facility] from home with complaints of left sided flank pain that started yesterday. Patient also complains of N/V. She states she has had kidney stones before this feels similar. VSS

## 2022-05-29 NOTE — ED Notes (Signed)
Pt ambulated to restroom with steady gait.

## 2022-05-29 NOTE — H&P (Incomplete)
Hospital Admission History and Physical Service Pager: [phone]  Patient name: Diane Freeman Medical record number: [id number] Date of Birth: 12-Feb-1946 Age: 77 y.o. Gender: female  Primary Care Provider: Zola Button, MD Consultants: None Code Status: Full which was confirmed with patient Preferred Emergency Contact:  Contact Information     Name Relation Home Work Mobile   Jessup,Patty Sister [phone]  [phone]      Chief Complaint: Dysuria  Assessment and Plan: Diane Freeman is a 77 y.o. female presenting with dysuria and flank pain . Differential for this patient's presentation of this includes pyelonephritis (positive CVA tenderness, reports N/V). Less likely differential includes nephrolithiasis (negative CT renal studies) and Crohn's flare (denies bloody stool, CT negative for acute flare)  No notes have been filed under this hospital service. Service: Family Medicine   FEN/GI: Heart healthy VTE Prophylaxis: Lovenox  Disposition: Med surg  History of Present Illness:  Diane Freeman is a 77 y.o. female presenting with worsening dysuria that initially started over the weekend. States the pain became unbearable this AM. Endorses frequency, cloudy urine, left flank pain, chills, nausea/vomiting. Denies hematuria, fever, urinary incontinence. She took Azo at home which didn't help. She has thrown up twice today and has been unable to keep food/drink down. Endorses frequent bowel movements but that is her normal with Crohn's disease. Reports prior episode of pyelonephritis 3-4 years ago requiring hospitalization.  In the ED, obtained CT renal study due to concern for pyelonephritis vs nephrolithiasis. Negative for acute process. Clinically appeared to be pyelonephritis and patient was given CTX. Afebrile with stable vital signs.  Review Of Systems: Per HPI above  Pertinent Past Medical History: Anemia Crohn's  disease Diverticulitis GERD NAFLD Remainder reviewed in history tab.   Pertinent Past Surgical History: Cholecystectomy Heart cath in 2015 Tubal ligation Remainder reviewed in history tab.   Pertinent Social History: Tobacco use: No Alcohol use: No Other Substance use: No Lives alone but recently her son started living with her  Pertinent Family History: Sister: stomach cancer  Remainder reviewed in history tab.   Important Outpatient Medications: Mesalamine 1.5g (4 pills) daily, only endorses taking 3 pills each day Omeprazole '20mg'$  daily Vitamin D Remainder reviewed in medication history.   Objective: BP (!) 109/94   Pulse (!) 59   Temp 98.4 F (36.9 C) (Oral)   Resp (!) 24   SpO2 98%  Exam: General: Alert, NAD Eyes: Anicteric sclera ENTM: Dry lips and tongue. No rhinorrhea or congestion Neck: Supple, non-tender Cardiovascular: RRR without murmur Respiratory: CTAB. Normal WOB on RA Gastrointestinal: Soft, suprapubic tenderness, non-distended. MSK: No peripheral edema Derm: Warm, dry, no rashes noted Neuro: Motor and sensation intact globally Psych: Cooperative, pleasant  Labs:  CBC BMET  Recent Labs  Lab 05/29/22 1820  WBC 12.2*  HGB 12.9  HCT 39.8  PLT 194   Recent Labs  Lab 05/29/22 1820  NA 133*  K 3.4*  CL 101  CO2 19*  BUN 11  CREATININE 1.17*  GLUCOSE 161*  CALCIUM 9.3    Pertinent additional labs:  Urinalysis    Component Value Date/Time   COLORURINE YELLOW 05/29/2022 [place] 05/29/2022 1807   LABSPEC 1.020 05/29/2022 1807   PHURINE 5.5 05/29/2022 1807   GLUCOSEU NEGATIVE 05/29/2022 1807   [place] (A) 05/29/2022 1807   BILIRUBINUR NEGATIVE 05/29/2022 1807   BILIRUBINUR NEG 05/13/2013 [place] 05/29/2022 1807   PROTEINUR NEGATIVE 05/29/2022 1807  UROBILINOGEN 0.2 06/18/2013 1246   NITRITE NEGATIVE 05/29/2022 1807   LEUKOCYTESUR LARGE (A) 05/29/2022 1807  WBC >50, few bacteria, RBC  0-5  Imaging Studies Performed: CT Renal Stone Study Result Date: 05/29/2022 IMPRESSION: 1. Sigmoid diverticulosis without diverticulitis. 2. No urinary tract calculi or obstructive uropathy. 3. Moderate fecal retention consistent with constipation. No obstruction or ileus. 4. Hiatal hernia. 5.  Aortic Atherosclerosis (ICD10-I70.0).  Colletta Maryland, MD 05/29/2022, 9:06 PM PGY-1, [place] Intern pager: [phone], text pages welcome Secure chat group [place]

## 2022-05-29 NOTE — ED Notes (Signed)
PT up to bathroom to give urine sample.

## 2022-05-29 NOTE — ED Provider Notes (Signed)
[facility] Provider Note   CSN: [id number] Arrival date & time: 05/29/22  1748     History  Chief Complaint  Patient presents with   Flank Pain    Diane Freeman is a 77 y.o. female.   Flank Pain     This is a 77 year old female presenting to the emergency department due to dysuria.  She is having dysuria for the last week as well as increased urinary frequency.  She states she is having suprapubic pain which is constant as of yesterday, she is having pain it radiates up her left flank intermittently which feels like previous kidney infections versus kidney stones.  She denies any hematuria, not having any chest pain or shortness of breath.  Denies any upper back pain, loss of consciousness.  Patient states she had nausea and vomiting which started today prompting today's ED visit.  Home Medications Prior to Admission medications   Medication Sig Start Date End Date Taking? Authorizing Provider  mesalamine (APRISO) 0.375 g 24 hr capsule Take 4 capsules (1.5 g total) by mouth daily. 11/13/21   Armbruster, Carlota Raspberry, MD  mupirocin ointment (BACTROBAN) 2 % Apply 1 application. topically 2 (two) times daily. Patient not taking: Reported on 11/13/2021 07/12/21   Concepcion Living, MD  omeprazole (PRILOSEC) 20 MG capsule TAKE 1 CAPSULE BY MOUTH EVERY DAY 11/13/21   Armbruster, Carlota Raspberry, MD  VITAMIN D, CHOLECALCIFEROL, PO Take 1 tablet by mouth every morning.     [provider]      Allergies    Patient has no known allergies.    Review of Systems   Review of Systems  Genitourinary:  Positive for flank pain.    Physical Exam Updated Vital Signs BP (!) 109/94 (BP Location: Right Arm)   Pulse (!) 59   Temp 98.4 F (36.9 C) (Oral)   Resp (!) 22   SpO2 100%  Physical Exam Vitals and nursing note reviewed. Exam conducted with a chaperone present.  Constitutional:      Appearance: Normal appearance.  HENT:     Head:  Normocephalic and atraumatic.  Eyes:     General: No scleral icterus.       Right eye: No discharge.        Left eye: No discharge.     Extraocular Movements: Extraocular movements intact.     Pupils: Pupils are equal, round, and reactive to light.  Cardiovascular:     Rate and Rhythm: Normal rate and regular rhythm.     Pulses: Normal pulses.     Heart sounds: Normal heart sounds. No murmur heard.    No friction rub. No gallop.     Comments: Upper and lower extremity pulses are symmetric bilaterally Pulmonary:     Effort: Pulmonary effort is normal. No respiratory distress.     Breath sounds: Normal breath sounds.  Abdominal:     General: Abdomen is flat. Bowel sounds are normal. There is no distension.     Palpations: Abdomen is soft.     Tenderness: There is abdominal tenderness. There is left CVA tenderness.     Comments: Mild suprapubic tenderness  Skin:    General: Skin is warm and dry.     Coloration: Skin is not jaundiced.     Findings: No rash.     Comments: No shingles  Neurological:     Mental Status: She is alert. Mental status is at baseline.     Coordination:  Coordination normal.     ED Results / Procedures / Treatments   Labs (all labs ordered are listed, but only abnormal results are displayed) Labs Reviewed  BASIC METABOLIC PANEL - Abnormal; Notable for the following components:      Result Value   Sodium 133 (*)    Potassium 3.4 (*)    CO2 19 (*)    Glucose, Bld 161 (*)    Creatinine, Ser 1.17 (*)    GFR, Estimated 48 (*)    All other components within normal limits  CBC WITH DIFFERENTIAL/PLATELET - Abnormal; Notable for the following components:   WBC 12.2 (*)    MCV 79.3 (*)    MCH 25.7 (*)    RDW 16.0 (*)    Neutro Abs 10.6 (*)    Lymphs Abs 0.6 (*)    All other components within normal limits  URINALYSIS, ROUTINE W REFLEX MICROSCOPIC - Abnormal; Notable for the following components:   Hgb urine dipstick SMALL (*)    Leukocytes,Ua LARGE (*)     All other components within normal limits  URINALYSIS, MICROSCOPIC (REFLEX) - Abnormal; Notable for the following components:   Bacteria, UA FEW (*)    All other components within normal limits    EKG None  Radiology No results found.  Procedures Procedures    Medications Ordered in ED Medications  cefTRIAXone (ROCEPHIN) 1 g in sodium chloride 0.9 % 100 mL IVPB (has no administration in time range)  sodium chloride 0.9 % bolus 1,000 mL (0 mLs Intravenous Stopped 05/29/22 2024)  ondansetron (ZOFRAN) injection 4 mg (4 mg Intravenous Given 05/29/22 1822)  fentaNYL (SUBLIMAZE) injection 50 mcg (50 mcg Intravenous Given 05/29/22 1821)    ED Course/ Medical Decision Making/ A&P                             Medical Decision Making Amount and/or Complexity of Data Reviewed Labs: ordered. Radiology: ordered.  Risk Prescription drug management.   This is a 77 year old female presenting to the emergency department due to flank pain/dysuria.  Differential includes UTI, pyelonephritis, nephrolithiasis, sepsis.    I considered dissection but patient not having any chest pain, upper back pain, and upper and lower extremity pulses are symmetric so I think that is unlikely.  She is also not having any cardiac symptoms, will check UA, CBC, BMP and CT renal study and then reevaluate.  I ordered fluids given blood pressure slightly soft although not technically hypotensive, she does not meet SIRS criteria and clinically is nonseptic.  I ordered, viewed and interpreted laboratory workup. CBC with leukocytosis with left shift. BMP with a mild AKI, no gross electrolyte derangement although slightly hypokalemic with a potassium of 3.4. UA with signs of obvious UTI with slight hematuria.  Patient has UTI and symptoms are concerning for pyelonephritis.  I will get a CT renal study to rule out septic stone/nephrolithiasis component.  Will empirically start on Rocephin, I also ordered fluids and  fentanyl.  BackCT renal is pending, she will need admission for pyelonephritis if the CT is negative for nephrolithiasis and septic stone.  Case discussed with Charmaine Downs was taking over care.          Final Clinical Impression(s) / ED Diagnoses Final diagnoses:  None    Rx / DC Orders ED Discharge Orders     None         Sherrill Raring, [place] 05/29/22 2038  Lacretia Leigh, MD 05/31/22 [phone]

## 2022-05-29 NOTE — H&P (Shared)
Hospital Admission History and Physical Service Pager: [phone]  Patient name: Diane Freeman Medical record number: [id number] Date of Birth: 08/06/1945 Age: 77 y.o. Gender: female  Primary Care Provider: Zola Button, MD Consultants: None Code Status: Full which was confirmed with patient Preferred Emergency Contact:  Contact Information     Name Relation Home Work Mobile   Jessup,Patty Sister [phone]  [phone]      Chief Complaint: Dysuria  Assessment and Plan: Diane Freeman is a 77 y.o. female presenting with dysuria and flank pain . Differential for this patient's presentation of this includes pyelonephritis (positive CVA tenderness, reports N/V). Less likely differential includes nephrolithiasis (negative CT renal studies) and Crohn's flare (denies bloody stool, CT negative for acute flare). PMHx includes Crohn's disease, anemia, GERD and NAFLD.  * Pyelonephritis Dysuria and urinary frequency x 4 days. Associated with N/V and L flank pain. Afebrile with stable vital signs. CT renal study negative for renal stone. H/o Crohn's disease but patient denies bloody stool or flare symptoms. Sx most likely secondary to pyelonephritis. Received CTX and 1L LR bolus in ED. Currently denies pain.  Requires admission given her nausea/vomiting and inability to take p.o.  Otherwise, would be able to continue with treatment outpatient. -Admitted to FMTS service, Dr. Thompson Grayer attending, MedSurg -Vital signs per unit -Continue CTX -Pain control: Tylenol '650mg'$  q6h prn -Strict I&Os -Initiate LR '@100mL'$ /hr,  -PO challenge, stop fluids if successful -Check EKG, to assess QT interval before providing further antiemetics  Orthopnea Patient reports orthopnea and dyspnea upon exertion. Additional pauses between words to take deeper breath.  Appears euvolemic.  Do not believe this is impacting her current medical condition but may warrant outpatient evaluation. Most recent echo in 2015,  no recent HF work up. -Monitor oxygen status -Consider echo  Chronic conditions: Crohn's disease: continue Mesalamine 1.2g daily GERD: continue Protonix '40mg'$  (formulary alternative)  FEN/GI: Heart healthy VTE Prophylaxis: Lovenox  Disposition: Med surg  History of Present Illness:  Diane Freeman is a 77 y.o. female presenting with worsening dysuria that initially started over the weekend. States the pain became unbearable this AM. Endorses frequency, cloudy urine, left flank pain, chills, nausea/vomiting. Denies hematuria, fever, urinary incontinence. She took Azo at home which didn't help. She has thrown up twice today and has been unable to keep food/drink down. Endorses frequent bowel movements but that is her normal with Crohn's disease. Reports prior episode of pyelonephritis 3-4 years ago requiring hospitalization. She is not sexually active.  In the ED, obtained CT renal study due to concern for pyelonephritis vs nephrolithiasis. Negative for acute process. Clinically appeared to be pyelonephritis and patient was given CTX. Afebrile with stable vital signs.  Review Of Systems: Per HPI above  Pertinent Past Medical History: Anemia Crohn's disease Diverticulitis GERD NAFLD Remainder reviewed in history tab.   Pertinent Past Surgical History: Cholecystectomy Heart cath in 2015 Tubal ligation Remainder reviewed in history tab.   Pertinent Social History: Tobacco use: No Alcohol use: No Other Substance use: No Lives alone but recently her son started living with her  Pertinent Family History: Sister: stomach cancer  Remainder reviewed in history tab.   Important Outpatient Medications: Mesalamine 1.5g (4 pills) daily, only endorses taking 3 pills each day Omeprazole '20mg'$  daily Vitamin D Remainder reviewed in medication history.   Objective: BP (!) 150/53 (BP Location: Left Arm)   Pulse 62   Temp 98 F (36.7 C) (Oral)   Resp 20   Ht 5'  3" (1.6 m)   Wt  67.4 kg   SpO2 100%   BMI 26.32 kg/m  Exam: General: Alert, NAD Eyes: Anicteric sclera ENTM: Dry lips and tongue. No rhinorrhea or congestion Neck: Supple, non-tender Cardiovascular: RRR without murmur Respiratory: CTAB. Normal WOB on RA Gastrointestinal: Soft, suprapubic tenderness, non-distended. MSK: No peripheral edema Derm: Warm, dry, no rashes noted Neuro: Motor and sensation intact globally Psych: Cooperative, pleasant  Labs:  CBC BMET  Recent Labs  Lab 05/29/22 1820  WBC 12.2*  HGB 12.9  HCT 39.8  PLT 194   Recent Labs  Lab 05/29/22 1820  NA 133*  K 3.4*  CL 101  CO2 19*  BUN 11  CREATININE 1.17*  GLUCOSE 161*  CALCIUM 9.3    Pertinent additional labs:  Urinalysis    Component Value Date/Time   COLORURINE YELLOW 05/29/2022 [place] 05/29/2022 1807   LABSPEC 1.020 05/29/2022 1807   PHURINE 5.5 05/29/2022 1807   GLUCOSEU NEGATIVE 05/29/2022 1807   HGBUR SMALL (A) 05/29/2022 1807   BILIRUBINUR NEGATIVE 05/29/2022 1807   BILIRUBINUR NEG 05/13/2013 1529   KETONESUR NEGATIVE 05/29/2022 1807   PROTEINUR NEGATIVE 05/29/2022 1807   UROBILINOGEN 0.2 06/18/2013 1246   NITRITE NEGATIVE 05/29/2022 1807   LEUKOCYTESUR LARGE (A) 05/29/2022 1807  WBC >50, few bacteria, RBC 0-5  Imaging Studies Performed: CT Renal Stone Study Result Date: 05/29/2022 IMPRESSION: 1. Sigmoid diverticulosis without diverticulitis. 2. No urinary tract calculi or obstructive uropathy. 3. Moderate fecal retention consistent with constipation. No obstruction or ileus. 4. Hiatal hernia. 5.  Aortic Atherosclerosis (ICD10-I70.0).  Colletta Maryland, DO 05/30/2022, 12:53 AM PGY-1, [facility] Intern pager: [phone], text pages welcome Secure chat group [place] Upper-Level Resident Addendum   I have independently interviewed and examined the patient. I have discussed the above with Dr. Jerilee Hoh and  agree with the documented plan. My edits for correction/addition/clarification are included above. Please see any attending notes.   Wells Guiles, DO PGY-2, [facility] 05/30/2022 12:54 AM  FPTS Service pager: [phone] (text pages welcome through [facility])

## 2022-05-30 ENCOUNTER — Observation Stay (HOSPITAL_COMMUNITY): Payer: Medicare Other

## 2022-05-30 ENCOUNTER — Telehealth: Payer: Self-pay | Admitting: Cardiovascular Disease

## 2022-05-30 DIAGNOSIS — Z800 Family history of malignant neoplasm of digestive organs: Secondary | ICD-10-CM | POA: Diagnosis not present

## 2022-05-30 DIAGNOSIS — N12 Tubulo-interstitial nephritis, not specified as acute or chronic: Secondary | ICD-10-CM | POA: Diagnosis not present

## 2022-05-30 DIAGNOSIS — R0601 Orthopnea: Secondary | ICD-10-CM | POA: Diagnosis present

## 2022-05-30 DIAGNOSIS — K219 Gastro-esophageal reflux disease without esophagitis: Secondary | ICD-10-CM | POA: Diagnosis present

## 2022-05-30 DIAGNOSIS — R0609 Other forms of dyspnea: Secondary | ICD-10-CM | POA: Diagnosis present

## 2022-05-30 DIAGNOSIS — K5010 Crohn's disease of large intestine without complications: Secondary | ICD-10-CM | POA: Diagnosis present

## 2022-05-30 DIAGNOSIS — E860 Dehydration: Secondary | ICD-10-CM | POA: Diagnosis present

## 2022-05-30 DIAGNOSIS — Z79899 Other long term (current) drug therapy: Secondary | ICD-10-CM | POA: Diagnosis not present

## 2022-05-30 DIAGNOSIS — R0602 Shortness of breath: Secondary | ICD-10-CM | POA: Diagnosis not present

## 2022-05-30 DIAGNOSIS — E876 Hypokalemia: Secondary | ICD-10-CM | POA: Diagnosis present

## 2022-05-30 DIAGNOSIS — D509 Iron deficiency anemia, unspecified: Secondary | ICD-10-CM | POA: Diagnosis present

## 2022-05-30 DIAGNOSIS — Z803 Family history of malignant neoplasm of breast: Secondary | ICD-10-CM | POA: Diagnosis not present

## 2022-05-30 DIAGNOSIS — I441 Atrioventricular block, second degree: Secondary | ICD-10-CM | POA: Diagnosis present

## 2022-05-30 DIAGNOSIS — R9431 Abnormal electrocardiogram [ECG] [EKG]: Secondary | ICD-10-CM | POA: Diagnosis not present

## 2022-05-30 DIAGNOSIS — K760 Fatty (change of) liver, not elsewhere classified: Secondary | ICD-10-CM | POA: Diagnosis present

## 2022-05-30 DIAGNOSIS — K5900 Constipation, unspecified: Secondary | ICD-10-CM | POA: Diagnosis present

## 2022-05-30 DIAGNOSIS — R0600 Dyspnea, unspecified: Secondary | ICD-10-CM | POA: Diagnosis not present

## 2022-05-30 LAB — TSH: TSH: 1.36 u[IU]/mL (ref 0.350–4.500)

## 2022-05-30 LAB — CBC
HCT: 33.6 % — ABNORMAL LOW (ref 36.0–46.0)
Hemoglobin: 10.9 g/dL — ABNORMAL LOW (ref 12.0–15.0)
MCH: 25.5 pg — ABNORMAL LOW (ref 26.0–34.0)
MCHC: 32.4 g/dL (ref 30.0–36.0)
MCV: 78.7 fL — ABNORMAL LOW (ref 80.0–100.0)
Platelets: 188 10*3/uL (ref 150–400)
RBC: 4.27 MIL/uL (ref 3.87–5.11)
RDW: 16.3 % — ABNORMAL HIGH (ref 11.5–15.5)
WBC: 12.2 10*3/uL — ABNORMAL HIGH (ref 4.0–10.5)
nRBC: 0 % (ref 0.0–0.2)

## 2022-05-30 LAB — MAGNESIUM: Magnesium: 1.8 mg/dL (ref 1.7–2.4)

## 2022-05-30 LAB — BASIC METABOLIC PANEL
Anion gap: 10 (ref 5–15)
BUN: 10 mg/dL (ref 8–23)
CO2: 19 mmol/L — ABNORMAL LOW (ref 22–32)
Calcium: 8.7 mg/dL — ABNORMAL LOW (ref 8.9–10.3)
Chloride: 104 mmol/L (ref 98–111)
Creatinine, Ser: 1.17 mg/dL — ABNORMAL HIGH (ref 0.44–1.00)
GFR, Estimated: 48 mL/min — ABNORMAL LOW (ref >60)
Glucose, Bld: 141 mg/dL — ABNORMAL HIGH (ref 70–99)
Potassium: 3.9 mmol/L (ref 3.5–5.1)
Sodium: 133 mmol/L — ABNORMAL LOW (ref 135–145)

## 2022-05-30 LAB — BRAIN NATRIURETIC PEPTIDE: B Natriuretic Peptide: 59 pg/mL (ref 0.0–100.0)

## 2022-05-30 MED ORDER — MAGNESIUM SULFATE 2 GM/50ML IV SOLN
INTRAVENOUS | Status: AC
  Filled 2022-05-30: qty 50

## 2022-05-30 MED ORDER — MAGNESIUM SULFATE IN D5W 1-5 GM/100ML-% IV SOLN
1.0000 g | Freq: Once | INTRAVENOUS | Status: AC
  Administered 2022-05-30: 1 g via INTRAVENOUS
  Filled 2022-05-30 (×2): qty 100

## 2022-05-30 MED ORDER — LACTATED RINGERS IV SOLN
INTRAVENOUS | Status: AC
  Administered 2022-05-30: 1000 mL via INTRAVENOUS

## 2022-05-30 MED ORDER — SODIUM CHLORIDE 0.9 % IV SOLN
1.0000 g | INTRAVENOUS | Status: DC
  Administered 2022-05-30: 1 g via INTRAVENOUS
  Filled 2022-05-30: qty 10

## 2022-05-30 NOTE — Consult Note (Signed)
Cardiology Consultation   Patient ID: Diane Freeman MRN: [id number]; DOB: 1945/05/05  Admit date: 05/29/2022 Date of Consult: 05/30/2022  PCP:  Diane Button, MD   [place] Providers Cardiologist:  None        Patient Profile:   Diane Freeman is a 77 y.o. female with a hx of adrenal nodule, anemia, colitis, Crohn's colitis, diverticulosis, GERD, fatty liver, esophageal stricture, gastritis who is being seen 05/30/2022 for the evaluation of dyspnea and abnormal ECG at the request of Dr. Rexene Freeman.  History of Present Illness:   [name] presented to the ED on 2/27 with symptoms of dysuria, polyuria, flank pain, nausea/vomiting. Initial workup negative for obstructive urinary process, UA with large leukocytes, few bacteria. Patient admitted due to intractable vomiting, suspicion for for pyelonephritis. During admission process, patient reported that she has chronic dyspnea on exertion. In further discussion with patient today, she says that she can tolerate ambulation up to 2 blocks on flat surface though afterwards she feels winded/needs to sit down. This has been the limit of her exertional capacity for about the last year. She says that she feels like overall endurance is slowly declining but she very clearly denies dramatic change in recent weeks. Patient denies orthopnea, says that she gets short of breath at night when she gets up to go to the bathroom. Regarding palpitations, patient says that she's had them as long as she can remember. Denies increased frequency. Patient denies recent chest pain, dizziness/lightheadedness. She reports infrequent and mild swelling of left lower leg.    Past Medical History:  Diagnosis Date   Adrenal nodule ([place])    Anemia    Blood transfusion without reported diagnosis    Cataract    bil removed   Choledocholithiasis with obstruction    Colitis    Crohn's colitis ([place]) 07/11/12   Diverticulosis    Fatty liver    GERD  (gastroesophageal reflux disease)    Hiatal hernia    History of esophageal stricture    Hx of gastritis     Past Surgical History:  Procedure Laterality Date   CARDIAC CATHETERIZATION  05/2013   CATARACT EXTRACTION Bilateral    CHOLECYSTECTOMY  2009   COLONOSCOPY  last 09/03/2017, 2020   ERCP  06/02/2011   Procedure: ENDOSCOPIC RETROGRADE CHOLANGIOPANCREATOGRAPHY (ERCP);  Surgeon: Gatha Mayer, MD;  Location: [facility];  Service: Endoscopy;  Laterality: N/A;   LEFT AND RIGHT HEART CATHETERIZATION WITH CORONARY ANGIOGRAM N/A 06/15/2013   Procedure: LEFT AND RIGHT HEART CATHETERIZATION WITH CORONARY ANGIOGRAM- Shunt series;  Surgeon: Blane Ohara, MD;  Location: [facility] CATH LAB;  Service: Cardiovascular;  Laterality: N/A;   TUBAL LIGATION     UPPER GASTROINTESTINAL ENDOSCOPY       Home Medications:  Prior to Admission medications   Medication Sig Start Date End Date Taking? Authorizing Provider  Cholecalciferol (VITAMIN D-3 PO) Take 1 capsule by mouth daily.   Yes [provider]  mesalamine (APRISO) 0.375 g 24 hr capsule Take 4 capsules (1.5 g total) by mouth daily. Patient taking differently: Take 0.75 g by mouth 2 (two) times daily. 11/13/21  Yes Armbruster, Carlota Raspberry, MD  omeprazole (PRILOSEC) 20 MG capsule TAKE 1 CAPSULE BY MOUTH EVERY DAY Patient taking differently: Take 20 mg by mouth daily. 11/13/21  Yes Armbruster, Carlota Raspberry, MD    Inpatient Medications: Scheduled Meds:  enoxaparin (LOVENOX) injection  40 mg Subcutaneous Daily   mesalamine  1.2 g Oral Daily  pantoprazole  40 mg Oral Daily   Continuous Infusions:  cefTRIAXone (ROCEPHIN)  IV     lactated ringers 1,000 mL (05/30/22 0112)   PRN Meds: acetaminophen  Allergies:   No Known Allergies  Social History:   Social History   Socioeconomic History   Marital status: Divorced    Spouse name: Not on file   Number of children: 2   Years of education: Not on file   Highest education level: Not on file   Occupational History   Occupation: retired  Tobacco Use   Smoking status: Never   Smokeless tobacco: Never  Vaping Use   Vaping Use: Never used  Substance and Sexual Activity   Alcohol use: No    Alcohol/week: 0.0 standard drinks of alcohol   Drug use: No   Sexual activity: Not on file  Other Topics Concern   Not on file  Social History Narrative   Not on file   Social Determinants of Health   Financial Resource Strain: Not on file  Food Insecurity: No Food Insecurity (05/30/2022)   Hunger Vital Sign    Worried About Running Out of Food in the Last Year: Never true    Ran Out of Food in the Last Year: Never true  Transportation Needs: No Transportation Needs (05/30/2022)   PRAPARE - Hydrologist (Medical): No    Lack of Transportation (Non-Medical): No  Physical Activity: Not on file  Stress: Not on file  Social Connections: Not on file  Intimate Partner Violence: Not At Risk (05/30/2022)   Humiliation, Afraid, Rape, and Kick questionnaire    Fear of Current or Ex-Partner: No    Emotionally Abused: No    Physically Abused: No    Sexually Abused: No    Family History:    Family History  Problem Relation Age of Onset   Bone cancer Maternal Grandmother    Colon cancer Maternal Aunt 60   Stomach cancer Sister 52       decsd   Breast cancer Sister    Esophageal cancer Neg Hx    Rectal cancer Neg Hx    Pancreatic cancer Neg Hx    Colon polyps Neg Hx      ROS:  Please see the history of present illness.   All other ROS reviewed and negative.     Physical Exam/Data:   Vitals:   05/29/22 2233 05/29/22 2323 05/30/22 0426 05/30/22 0809  BP:  (!) 150/53 (!) 112/49 (!) 119/57  Pulse:  62 65 74  Resp:  20  16  Temp:  98 F (36.7 C) 98.7 F (37.1 C) 97.9 F (36.6 C)  TempSrc:  Oral Oral Oral  SpO2: 100% 100% 91% 100%  Weight:  67.4 kg    Height:  '5\' 3"'$  (1.6 m)      Intake/Output Summary (Last 24 hours) at 05/30/2022 1352 Last data  filed at 05/29/2022 2249 Gross per 24 hour  Intake 1100 ml  Output --  Net 1100 ml      05/29/2022   11:23 PM 11/13/2021    1:33 PM 07/12/2021    1:54 PM  Last 3 Weights  Weight (lbs) 148 lb 9.4 oz 153 lb 12.8 oz 156 lb 6.4 oz  Weight (kg) 67.4 kg 69.763 kg 70.943 kg     Body mass index is 26.32 kg/m.  General:  Well nourished, well developed, in no acute distress HEENT: normal Neck: no JVD Vascular: No carotid bruits; Distal pulses  2+ bilaterally Cardiac:  normal S1, S2; irregular rhythm; no murmur noted Lungs:  clear to auscultation bilaterally, no wheezing, rhonchi or rales  Abd: soft, nontender, no hepatomegaly  Ext: no edema Musculoskeletal:  No deformities, BUE and BLE strength normal and equal Skin: warm and dry  Neuro:  CNs 2-12 intact, no focal abnormalities noted Psych:  Normal affect   EKG:  The EKG was personally reviewed and demonstrates:  sinus rhythm with underlying first degree AV block. Intermittent PACs with compensatory pause.  Telemetry:  Patient is not on telemetry  Relevant CV Studies:  09/07/2014 48 hour holter  Normal sinus rhythm with first degree AV block Rare PVC and PACs Non significant pauses.   No significant arrhythmia overall.   06/13/2013 TTE  Study Conclusions   - Left ventricle: The cavity size was normal. Wall thickness    was normal. Systolic function was normal. The estimated    ejection fraction was in the range of 60% to 65%. Wall    motion was normal; there were no regional wall motion    abnormalities. Doppler parameters are consistent with    abnormal left ventricular relaxation (grade 1 diastolic    dysfunction).  - Tricuspid valve: Mild-moderate regurgitation.  - Pulmonary arteries: PA peak pressure: 51m Hg (S).  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  M-mode, complete 2D,  spectral Doppler, and color Doppler.  Height:  Height:  157.5cm. Height:  62in.  Weight:  Weight: 63.5kg. Weight:  139.7lb.  Body mass index:  BMI: 25.6kg/m^2.  Body surface  area:    BSA: 1.649m.  Blood pressure:     123/68.  Patient  status:  Inpatient.   ------------------------------------------------------------   ------------------------------------------------------------  Left ventricle:  The cavity size was normal. Wall thickness  was normal. Systolic function was normal. The estimated  ejection fraction was in the range of 60% to 65%. Wall  motion was normal; there were no regional wall motion  abnormalities. Doppler parameters are consistent with  abnormal left ventricular relaxation (grade 1 diastolic  dysfunction).   ------------------------------------------------------------  Aortic valve:   Structurally normal valve.   Cusp separation  was normal.  Doppler:  Transvalvular velocity was within the  normal range. There was no stenosis.  No regurgitation.   ------------------------------------------------------------  Aorta: The aorta was normal, not dilated, and non-diseased.    ------------------------------------------------------------  Mitral valve:   Structurally normal valve.   Leaflet  separation was normal.  Doppler:  Transvalvular velocity was  within the normal range. There was no evidence for stenosis.   Trivial regurgitation.   ------------------------------------------------------------  Left atrium:  The atrium was normal in size.   ------------------------------------------------------------  Right ventricle:  In the apex of the right ventricle there  appears to be a thrombus or mass adjacent to the moderator  band measuring 2x2 cm. The apex of the right ventricle  appears to be hypokinetic.   ------------------------------------------------------------  Pulmonic valve:    Structurally normal valve.   Cusp  separation was normal.  Doppler:  Transvalvular velocity was  within the normal range.  No regurgitation.    ------------------------------------------------------------  Tricuspid valve:   Doppler:   Mild-moderate regurgitation.    ------------------------------------------------------------  Right atrium:  The atrium was normal in size.   ------------------------------------------------------------  Pericardium: There was no pericardial effusion.    ------------------------------------------------------------  Post procedure conclusions  Ascending Aorta:   - The aorta was normal, not dilated, and non-diseased.  LHC/RHC 2015  Procedure: Right Heart Cath, Left Heart Cath, Selective Coronary Angiography, LV angiography   Indication: Chest pain, shortness of breath. Dilated RV question ASD.           Procedural Details: The right groin was prepped, draped, and anesthetized with 1% lidocaine. Using the modified Seldinger technique a 5 French sheath was placed in the right femoral artery and a 7 French sheath was placed in the right femoral vein. A Swan-Ganz catheter was used for the right heart catheterization. Standard protocol was followed for recording of right heart pressures and sampling of oxygen saturations. Fick cardiac output was calculated. Standard Judkins catheters were used for selective coronary angiography and left ventriculography. A multipurpose catheter and angled glidewire were used to attempt to cross the interatrial septum but this was unsuccessful. There were no immediate procedural complications. The patient was transferred to the post catheterization recovery area for further monitoring.   Procedural Findings: Hemodynamics RA 4 RV 29/6 PA 30/7 mean 17 PCWP 8 LV 141/13 AO 143/71   Oxygen saturations: PA 66 AO 92 SVC 60   Cardiac Output (Fick) 4.5  Cardiac Index (Fick) 2.7            Coronary angiography: Coronary dominance: right   Left mainstem: Widely patent without obstructive disease. Divides into the LAD and LCx.   Left anterior descending (LAD):  large caliber vessel that wraps around the LV apex. There is minimal diffuse irregularity but no obstructive disease appreciated throughout the course of the LAD   Left circumflex (LCx): Large vessel without obstructive disease. There are 3 OM branches without obstructive disease.    Right coronary artery (RCA): mild diffuse irregularity with a dilated portion in the mid-vessel. No evidence of significant coronary obstruction. Dominant vessel supplying a PDA and PLA   Left ventriculography: Left ventricular systolic function is normal, LVEF is estimated at 65%, there is no significant mitral regurgitation    Final Conclusions:   1. Patent coronary arteries with minimal nonobstructive CAD 2. Normal intracardiac hemodynamics 3. Normal LV function 4. No evidence of left-to-right intracardiac shunt   Recommendations: suspect noncardiac symptoms.     Laboratory Data:  High Sensitivity Troponin:  No results for input(s): "TROPONINIHS" in the last 720 hours.   Chemistry Recent Labs  Lab 05/29/22 1820 05/30/22 0411  NA 133* 133*  K 3.4* 3.9  CL 101 104  CO2 19* 19*  GLUCOSE 161* 141*  BUN 11 10  CREATININE 1.17* 1.17*  CALCIUM 9.3 8.7*  GFRNONAA 48* 48*  ANIONGAP 13 10    No results for input(s): "PROT", "ALBUMIN", "AST", "ALT", "ALKPHOS", "BILITOT" in the last 168 hours. Lipids No results for input(s): "CHOL", "TRIG", "HDL", "LABVLDL", "LDLCALC", "CHOLHDL" in the last 168 hours.  Hematology Recent Labs  Lab 05/29/22 1820 05/30/22 0411  WBC 12.2* 12.2*  RBC 5.02 4.27  HGB 12.9 10.9*  HCT 39.8 33.6*  MCV 79.3* 78.7*  MCH 25.7* 25.5*  MCHC 32.4 32.4  RDW 16.0* 16.3*  PLT 194 188   Thyroid No results for input(s): "TSH", "FREET4" in the last 168 hours.  BNPNo results for input(s): "BNP", "PROBNP" in the last 168 hours.  DDimer No results for input(s): "DDIMER" in the last 168 hours.   Radiology/Studies:  CT Renal Stone Study  Result Date: 05/29/2022 CLINICAL DATA:   Left-sided flank pain since yesterday, nausea and vomiting EXAM: CT ABDOMEN AND PELVIS WITHOUT CONTRAST TECHNIQUE: Multidetector CT imaging of the abdomen and pelvis was performed following the  standard protocol without IV contrast. RADIATION DOSE REDUCTION: This exam was performed according to the departmental dose-optimization program which includes automated exposure control, adjustment of the mA and/or kV according to patient size and/or use of iterative reconstruction technique. COMPARISON:  06/18/2013 FINDINGS: Lower chest: No acute pleural or parenchymal lung disease. Moderate hiatal hernia. Hepatobiliary: Cholecystectomy. Unremarkable unenhanced appearance of the liver. No biliary duct dilation. Pancreas: Unremarkable unenhanced appearance. Spleen: Unremarkable unenhanced appearance. Adrenals/Urinary Tract: Simple 4.8 cm cyst lower pole right kidney does not require follow-up. No urinary tract calculi or obstructive uropathy within either kidney. The adrenals and bladder are unremarkable. Stomach/Bowel: No bowel obstruction or ileus. Diverticulosis of the sigmoid colon without evidence of diverticulitis. Moderate retained stool throughout the colon. No bowel wall thickening or inflammatory change. Vascular/Lymphatic: Aortic atherosclerosis. No enlarged abdominal or pelvic lymph nodes. Reproductive: Uterus and bilateral adnexa are unremarkable. Other: No free fluid or free intraperitoneal gas. No abdominal wall hernia. Musculoskeletal: No acute or destructive bony lesions. Reconstructed images demonstrate no additional findings. IMPRESSION: 1. Sigmoid diverticulosis without diverticulitis. 2. No urinary tract calculi or obstructive uropathy. 3. Moderate fecal retention consistent with constipation. No obstruction or ileus. 4. Hiatal hernia. 5.  Aortic Atherosclerosis (ICD10-I70.0). Electronically Signed   By: Randa Ngo M.D.   On: 05/29/2022 20:36     Assessment and Plan:   Exertional  dyspnea  Patient admitted with dysuria and polyuria x4. Now undergoing treatment of suspected pyelonephritis. While being admitted, also reported symptoms concerning for worsening dyspnea on exertion. Limited cardiac history, TTE in 2015 with normal LVEF. 2015 LHC/RHC with patent coronary arteries with minimal nonobstructive CAD, normal intracardiac hemodynamics, normal LV function.  No physical exam evidence of volume overload. No significant murmurs appreciated. Exertional dyspnea seems to be chronic rather than acute.  Will check BNP, TSH No indication to assess echocardiogram at this time  Arrhythmia Palpitations  Patient with longstanding hx palpitations had ECGs checked to evaluate Qtc with frequent anti-emetic use during this admission. Per primary care team, there was concern that these appeared to have new arrhythmia in comparison to previous tracings from 2016.    Upon review of ECGs obtained today, motion artifact noted in both. Underlying sinus rhythm with first degree AV block (previously seen on 2016 holter monitor). Intermittent PACs with compensatory pause.  Overall reassuring electrophysiology. No indication for rhythm monitoring at this time as patient does not seem to have significant symptoms with PACs.     Risk Assessment/Risk Scores:                For questions or updates, please contact [place] Please consult [email or web address] for contact info under    Signed, Lily Kocher, PA-C  05/30/2022 1:52 PM

## 2022-05-30 NOTE — Hospital Course (Addendum)
Diane Freeman is a 77 y.o.female with a history of Crohn's disease, anemia, GERD and NAFLD who was admitted to the [facility] Medicine Teaching Service at [facility] for dysuria secondary to pyelonephritis. Her hospital course is detailed below:  Pyelonephritis Admitted with dysuria, urinary frequency, N/V and flank pain x 4 days. CT renal study negative for nephrolithiasis. Received CTX for 3 days and pain well controlled. Leukocytosis resolved. Urine culture required reincubation for better growth, thus was not resulted by time of discharge. At discharged patient tolerating PO well and sent on cefixime for 7 days.  Dyspnea/Orthopnea Patient was having dyspnea and orthopnea at home and had slightly abnormal EKG thus cardiology was consulted. Cardiology noted that EKG was similar to prior ZIO patch in 2016 with Mobitz 1 heart block and occasional pac with compensatory pause. BNP and TSH were normal thus, dyspnea thought to be due to deconditioning.   Other chronic conditions were medically managed with home medications and formulary alternatives as necessary (Crohn's disease - mesalamine, GERD - protonix)  PCP Follow-up Recommendations:  F/u urine culture and amend antibiotics as needed.

## 2022-05-30 NOTE — Progress Notes (Signed)
     Daily Progress Note Intern Pager: [phone]  Patient name: Diane Freeman Medical record number: [id number] Date of birth: [birth year] Age: 77 y.o. Gender: female  Primary Care Provider: Zola Button, MD Consultants: None Code Status: Full  Pt Overview and Major Events to Date:  2/28 - Admitted, Started ceftriaxone   Assessment and Plan: Diane Freeman is a 77 y.o. female presenting with dysuria and flank pain with likely pyelonephritis.   Pertinent PMH includes Crohn's disease, anemia, GERD, and NAFLD.  .  * Pyelonephritis Pain improved, Urinalysis appears infectious and CT stone study without stones. Most likely pyelonephritis. Cr also increased though not at AKI level, most likely dehydration secondary to vomiting and infection.  -Continue CTX - F/u Ucx  -Pain control: Tylenol 624m q6h prn -Strict I&Os - Continue LR @100mL$ /hr,   Orthopnea Patient reports orthopnea and dyspnea upon exertion. Additional pauses between words to take deeper breath.  Has trace BLE edema. EKG also might have slightly prolonged pr with skipped beat. No history of arrhythmia. Most recent echo in 2015, no recent HF work up. - Monitor oxygen status - Consider echo - Consult cardiology    Chronic conditions: Crohn's disease: continue Mesalamine 1.2g daily GERD: continue Protonix 472m(formulary alternative)  FEN/GI: Heart healthy  PPx: Lovenox  Dispo:Home pending clinical improvement .   Subjective:  Patient said her pain is improving and that she is not nauseous right now. She is afraid to go home while not feeling well as she lives alone   Objective: Temp:  [97.9 F (36.6 C)-98.7 F (37.1 C)] 97.9 F (36.6 C) (02/28 0809) Pulse Rate:  [59-83] 74 (02/28 0809) Resp:  [16-24] 16 (02/28 0809) BP: (109-150)/(49-94) 119/57 (02/28 0809) SpO2:  [91 %-100 %] 100 % (02/28 0809) FiO2 (%):  [21 %] 21 % (02/27 2233) Weight:  [67.4 kg] 67.4 kg (02/27 2323) Physical Exam: General: Well  appearing  Cardiovascular: RRR, cap refill = 3 seconds  Respiratory: Normal work of breathing Abdomen: Mild pain to palpation of left flank, soft, non distended  Extremities: trace BLE edema   Laboratory: Most recent CBC Lab Results  Component Value Date   WBC 12.2 (H) 05/30/2022   HGB 10.9 (L) 05/30/2022   HCT 33.6 (L) 05/30/2022   MCV 78.7 (L) 05/30/2022   PLT 188 05/30/2022   Most recent BMP    Latest Ref Rng & Units 05/30/2022    4:11 AM  BMP  Glucose 70 - 99 mg/dL 141   BUN 8 - 23 mg/dL 10   Creatinine 0.44 - 1.00 mg/dL 1.17   Sodium 135 - 145 mmol/L 133   Potassium 3.5 - 5.1 mmol/L 3.9   Chloride 98 - 111 mmol/L 104   CO2 22 - 32 mmol/L 19   Calcium 8.9 - 10.3 mg/dL 8.7     BoLowry RamMD 05/30/2022, 1:20 PM  PGY-1, [place] pager: [phone] pages welcome Secure chat group [email or web address]

## 2022-05-30 NOTE — Assessment & Plan Note (Addendum)
Pain improved, Urinalysis appears infectious and CT stone study without stones. Most likely pyelonephritis. Cr also increased though not at AKI level, most likely dehydration secondary to vomiting and infection.  -Continue CTX - F/u Ucx  -Pain control: Tylenol '650mg'$  q6h prn -Strict I&Os - Continue LR '@100mL'$ /hr,

## 2022-05-30 NOTE — Assessment & Plan Note (Signed)
>>  ASSESSMENT AND PLAN FOR SHORTNESS OF BREATH WRITTEN ON 05/31/2022 11:32 AM BY Lockie Mola, MD  Cardiology saw patient and due to wnl BNP and TSH, EKG similar to prior zio, dyspnea thought to be due to deconditioning  - Work of re conditioning outpatient and monitor dyspnea symptoms

## 2022-05-30 NOTE — Assessment & Plan Note (Addendum)
Patient reports orthopnea and dyspnea upon exertion. Additional pauses between words to take deeper breath.  Has trace BLE edema. EKG also might have slightly prolonged pr with skipped beat. No history of arrhythmia. Most recent echo in 2015, no recent HF work up. - Monitor oxygen status - Consider echo - Consult cardiology

## 2022-05-31 ENCOUNTER — Other Ambulatory Visit (HOSPITAL_COMMUNITY): Payer: Self-pay

## 2022-05-31 DIAGNOSIS — N12 Tubulo-interstitial nephritis, not specified as acute or chronic: Secondary | ICD-10-CM | POA: Diagnosis not present

## 2022-05-31 LAB — URINE CULTURE: Culture: >=100000 — AB

## 2022-05-31 LAB — BASIC METABOLIC PANEL
Anion gap: 8 (ref 5–15)
BUN: 9 mg/dL (ref 8–23)
CO2: 22 mmol/L (ref 22–32)
Calcium: 8.5 mg/dL — ABNORMAL LOW (ref 8.9–10.3)
Chloride: 101 mmol/L (ref 98–111)
Creatinine, Ser: 1.11 mg/dL — ABNORMAL HIGH (ref 0.44–1.00)
GFR, Estimated: 51 mL/min — ABNORMAL LOW (ref >60)
Glucose, Bld: 105 mg/dL — ABNORMAL HIGH (ref 70–99)
Potassium: 3.6 mmol/L (ref 3.5–5.1)
Sodium: 131 mmol/L — ABNORMAL LOW (ref 135–145)

## 2022-05-31 LAB — CBC WITH DIFFERENTIAL/PLATELET
Abs Immature Granulocytes: 0.02 10*3/uL (ref 0.00–0.07)
Basophils Absolute: 0 10*3/uL (ref 0.0–0.1)
Basophils Relative: 0 %
Eosinophils Absolute: 0.1 10*3/uL (ref 0.0–0.5)
Eosinophils Relative: 2 %
HCT: 31.9 % — ABNORMAL LOW (ref 36.0–46.0)
Hemoglobin: 10.1 g/dL — ABNORMAL LOW (ref 12.0–15.0)
Immature Granulocytes: 0 %
Lymphocytes Relative: 13 %
Lymphs Abs: 0.8 10*3/uL (ref 0.7–4.0)
MCH: 25.3 pg — ABNORMAL LOW (ref 26.0–34.0)
MCHC: 31.7 g/dL (ref 30.0–36.0)
MCV: 79.9 fL — ABNORMAL LOW (ref 80.0–100.0)
Monocytes Absolute: 0.7 10*3/uL (ref 0.1–1.0)
Monocytes Relative: 12 %
Neutro Abs: 4.1 10*3/uL (ref 1.7–7.7)
Neutrophils Relative %: 73 %
Platelets: 142 10*3/uL — ABNORMAL LOW (ref 150–400)
RBC: 3.99 MIL/uL (ref 3.87–5.11)
RDW: 16.3 % — ABNORMAL HIGH (ref 11.5–15.5)
WBC: 5.7 10*3/uL (ref 4.0–10.5)
nRBC: 0 % (ref 0.0–0.2)

## 2022-05-31 MED ORDER — POTASSIUM CHLORIDE 20 MEQ PO PACK
40.0000 meq | PACK | Freq: Once | ORAL | Status: AC
  Administered 2022-05-31: 40 meq via ORAL
  Filled 2022-05-31: qty 2

## 2022-05-31 MED ORDER — CEFIXIME 400 MG PO CAPS
400.0000 mg | ORAL_CAPSULE | Freq: Every day | ORAL | 0 refills | Status: DC
  Filled 2022-05-31: qty 7, 7d supply, fill #0

## 2022-05-31 MED ORDER — CEFIXIME 400 MG PO CAPS
400.0000 mg | ORAL_CAPSULE | Freq: Every day | ORAL | Status: DC
  Administered 2022-05-31: 400 mg via ORAL
  Filled 2022-05-31: qty 1

## 2022-05-31 NOTE — Progress Notes (Signed)
Patient was given discharge and stated understanding,  Picked up patients TOC meds on the way out to discharge lounge.

## 2022-05-31 NOTE — Discharge Instructions (Signed)
Dear Diane Freeman,   Thank you so much for allowing [place] to be part of your care!  You were admitted to [facility] for pyelonephritis (a kidney infection). We treated you with IV antibiotics. You were also seen in the hospital by the cardiologist to evaluate your shortness of breath. They said this was mostly due to deconditioning. We recommend you work of increasing your exercise after you leave the hospital.    [place] Continue cefaxim for 7 more days.  Please let PCP/Specialists know of any changes that were made.  Please see medications section of this packet for any medication changes.   DOCTOR'S APPOINTMENT & FOLLOW UP CARE INSTRUCTIONS  Future Appointments  Date Time Provider [place]  06/05/2022  3:30 PM Lowry Ram, MD [facility] [place]    RETURN PRECAUTIONS: For worsening flank pain, fevers, chills.   Take care and be well!  [facility]  [place], [place] [address] [phone]

## 2022-05-31 NOTE — Discharge Summary (Signed)
[facility] Discharge Summary  Patient name: Diane Freeman Medical record number: [id number] Date of birth: 1945/09/26 Age: 77 y.o. Gender: female Date of Admission: 05/29/2022  Date of Discharge: 05/31/22  Admitting Physician: Gerrit Heck, MD  Primary Care Provider: Zola Button, MD Consultants: None  Indication for Hospitalization: Pyelonephritis   Discharge Diagnoses/Problem List:  Principal Problem for Admission: Flank pain  Other Problems addressed during stay:  Principal Problem:   Pyelonephritis Active Problems:   Shortness of breath    Brief Hospital Course:  Diane Freeman is a 77 y.o.female with a history of Crohn's disease, anemia, GERD and NAFLD who was admitted to the [facility] at [facility] for dysuria secondary to pyelonephritis. Her hospital course is detailed below:  Pyelonephritis Admitted with dysuria, urinary frequency, N/V and flank pain x 4 days. CT renal study negative for nephrolithiasis. Received CTX for 3 days and pain well controlled. Leukocytosis resolved. Urine culture required reincubation for better growth, thus was not resulted by time of discharge. At discharged patient tolerating PO well and sent on cefixime for 7 days.  Dyspnea/Orthopnea Patient was having dyspnea and orthopnea at home and had slightly abnormal EKG thus cardiology was consulted. Cardiology noted that EKG was similar to prior ZIO patch in 2016 with Mobitz 1 heart block and occasional pac with compensatory pause. BNP and TSH were normal thus, dyspnea thought to be due to deconditioning.   Other chronic conditions were medically managed with home medications and formulary alternatives as necessary (Crohn's disease - mesalamine, GERD - protonix)  PCP Follow-up Recommendations:  F/u urine culture and amend antibiotics as needed.   Disposition: Home  Discharge Condition: Stable, improved  Discharge Exam:  Vitals:    05/31/22 0520 05/31/22 0725  BP: 123/63 (!) 136/92  Pulse: 82 76  Resp: 17   Temp: 98.2 F (36.8 C) 98.3 F (36.8 C)  SpO2: 93% 95%   Physical Exam: General: Well appearing  Cardiovascular: RRR, cap refill < 2 seconds Respiratory: Normal work of breathing Abdomen: Mild pain to palpation of left flank, soft, non distended  Extremities: trace BLE edema   Significant Procedures: None  Significant Labs and Imaging:  Recent Labs  Lab 05/29/22 1820 05/30/22 0411 05/31/22 0613  WBC 12.2* 12.2* 5.7  HGB 12.9 10.9* 10.1*  HCT 39.8 33.6* 31.9*  PLT 194 188 142*   Recent Labs  Lab 05/29/22 1820 05/30/22 0411 05/31/22 0613  NA 133* 133* 131*  K 3.4* 3.9 3.6  CL 101 104 101  CO2 19* 19* 22  GLUCOSE 161* 141* 105*  BUN '11 10 9  '$ CREATININE 1.17* 1.17* 1.11*  CALCIUM 9.3 8.7* 8.5*  MG 1.8  --   --     CT Renal Stone Study  1. Sigmoid diverticulosis without diverticulitis. 2. No urinary tract calculi or obstructive uropathy. 3. Moderate fecal retention consistent with constipation. No obstruction or ileus. 4. Hiatal hernia. 5.  Aortic Atherosclerosis (ICD10-I70.0).  Results/Tests Pending at Time of Discharge: Urine Culture (was reincubated for better growth)   Discharge Medications:  Allergies as of 05/31/2022   No Known Allergies      Medication List     TAKE these medications    cefixime 400 MG Caps capsule Commonly known as: SUPRAX Take 1 capsule (400 mg total) by mouth daily. Start taking on: June 01, 2022   mesalamine 0.375 g 24 hr capsule Commonly known as: APRISO Take 4 capsules (1.5 g total) by mouth daily. What  changed:  how much to take when to take this   omeprazole 20 MG capsule Commonly known as: PRILOSEC TAKE 1 CAPSULE BY MOUTH EVERY DAY What changed:  how much to take how to take this when to take this additional instructions   VITAMIN D-3 PO Take 1 capsule by mouth daily.        Discharge Instructions: Please refer to Patient  Instructions section of EMR for full details.  Patient was counseled important signs and symptoms that should prompt return to medical care, changes in medications, dietary instructions, activity restrictions, and follow up appointments.   Follow-Up Appointments: 3/5 at 3:30 pm with Dr. Lowry Ram [facility]   Lowry Ram, MD 05/31/2022, 11:30 AM PGY-1, [place]

## 2022-05-31 NOTE — Progress Notes (Signed)
     Daily Progress Note Intern Pager: [phone]  Patient name: Diane Freeman Medical record number: [id number] Date of birth: 1945/12/12 Age: 77 y.o. Gender: female  Primary Care Provider: Zola Button, MD Consultants: None Code Status: Full   Pt Overview and Major Events to Date:  2/27 - Admitted, Started ceftriaxone  2/29 - Discontinued CTX and started cefixime    Assessment and Plan: Diane Freeman is a 77 y.o. female presenting with dysuria and flank pain with likely pyelonephritis now improved.    Pertinent PMH includes Crohn's disease, anemia, GERD, and NAFLD.   * Pyelonephritis Cr stable and luekocytosis resolved. Patient remains afebrile. Ucx required re-incubation for better growth.  - Discontinue CTX and transition to cefixime  - F/u Ucx outpatient and amend abx as necessary   Shortness of breath Cardiology saw patient and due to wnl BNP and TSH, EKG similar to prior zio, dyspnea thought to be due to deconditioning  - Work of re conditioning outpatient and monitor dyspnea symptoms        FEN/GI: Regular, taking po well, off IVF PPx: lovenox Dispo:Plan to dc home.   Subjective:  Pain greatly improved. No complaints   Objective: Temp:  [98.2 F (36.8 C)-99.7 F (37.6 C)] 98.3 F (36.8 C) (02/29 0725) Pulse Rate:  [67-90] 76 (02/29 0725) Resp:  [16-17] 17 (02/29 0520) BP: (123-136)/(54-92) 136/92 (02/29 0725) SpO2:  [92 %-97 %] 95 % (02/29 0725) Physical Exam: General: Well appearing  Cardiovascular: RRR, cap refill < 2 seconds  Respiratory: Normal work of breathing Abdomen: Mild pain to palpation of left flank, soft, non distended  Extremities: trace BLE edema   Laboratory: Most recent CBC Lab Results  Component Value Date   WBC 5.7 05/31/2022   HGB 10.1 (L) 05/31/2022   HCT 31.9 (L) 05/31/2022   MCV 79.9 (L) 05/31/2022   PLT 142 (L) 05/31/2022   Most recent BMP    Latest Ref Rng & Units 05/31/2022    6:13 AM  BMP  Glucose 70 - 99  mg/dL 105   BUN 8 - 23 mg/dL 9   Creatinine 0.44 - 1.00 mg/dL 1.11   Sodium 135 - 145 mmol/L 131   Potassium 3.5 - 5.1 mmol/L 3.6   Chloride 98 - 111 mmol/L 101   CO2 22 - 32 mmol/L 22   Calcium 8.9 - 10.3 mg/dL 8.5     Lowry Ram, MD 05/31/2022, 11:33 AM  PGY-1, [place] Intern pager: [phone], text pages welcome Secure chat group [place]

## 2022-05-31 NOTE — Plan of Care (Signed)

## 2022-06-05 ENCOUNTER — Encounter: Payer: Self-pay | Admitting: Family Medicine

## 2022-06-05 ENCOUNTER — Ambulatory Visit: Payer: Medicare Other | Admitting: Family Medicine

## 2022-06-05 VITALS — BP 145/55 | HR 63 | Ht 63.0 in | Wt 149.0 lb

## 2022-06-05 DIAGNOSIS — R030 Elevated blood-pressure reading, without diagnosis of hypertension: Secondary | ICD-10-CM | POA: Diagnosis not present

## 2022-06-05 NOTE — Assessment & Plan Note (Addendum)
Elevated BP reading at clinic today 145/55. In the hospital setting only had a few elevated bp readings but mostly normal.  - Recommend following up with PCP for another BP check and treating if still elevated.  - Check BMP at f/u visit.

## 2022-06-05 NOTE — Patient Instructions (Addendum)
It was wonderful to see you today.  Please bring ALL of your medications with you to every visit.   Today we talked about:  Hospital follow up - You're doing great! Make sure you take those last two antibiotic pills.   Work on doing the exercises I put in this packet and increase your walking.   Please follow up as needed   Thank you for choosing [facility].   Please call [phone] with any questions about today's appointment.  Please be sure to schedule follow up at the front desk before you leave today.   Lowry Ram, MD  Family Medicine

## 2022-06-05 NOTE — Progress Notes (Cosign Needed Addendum)
    SUBJECTIVE:   CHIEF COMPLAINT / HPI:   Hospital Follow up  Patient says she is feeling much better. She only has two pills of her antibiotic left. Denies dysuria, fevers, or abdominal pain. Says that she just feels a little weak still from being in the hospital. Her sister is here at the appointment with her and says that she is moving back to be closer to the patient and will encourage patient to increase her exercise. Patient says she is working on increasing her walking.   PERTINENT  PMH / PSH: G1DD, Crohn's Disease  OBJECTIVE:   BP (!) 145/55   Pulse 63   Ht '5\' 3"'$  (1.6 m)   Wt 149 lb (67.6 kg)   SpO2 99%   BMI 26.39 kg/m   General: well appearing, in no acute distress CV: RRR, radial pulses equal and palpable, no BLE edema  Resp: Normal work of breathing on room air, CTAB Abd: Soft, non tender, non distended, no CVA tenderness  Neuro: Alert & Oriented x 4  MSK: requires support to get up from chair.    ASSESSMENT/PLAN:   Hospital F/u I Pyelonephritis  Patient has improved from hospital admission for pyelonephritis. No longer has abdominal pain or dysuria. No fevers, most likely no infection remaining. However, still slightly deconditioned.  - Finish antibiotic course (2 pills left)  - Offered exercise resources to help with conditioning.  - F/u with PCP    Elevated blood pressure reading Elevated BP reading at clinic today 145/55. In the hospital setting only had a few elevated bp readings but mostly normal.  - Recommend following up with PCP for another BP check and treating if still elevated.  - Check BMP at f/u visit.      Lowry Ram, MD [place]

## 2022-08-02 ENCOUNTER — Encounter: Payer: Self-pay | Admitting: Gastroenterology

## 2022-08-21 NOTE — Telephone Encounter (Signed)
Error

## 2022-10-15 ENCOUNTER — Other Ambulatory Visit: Payer: Self-pay | Admitting: Gastroenterology

## 2022-11-06 ENCOUNTER — Other Ambulatory Visit: Payer: Self-pay | Admitting: Gastroenterology

## 2022-11-07 ENCOUNTER — Other Ambulatory Visit: Payer: Self-pay | Admitting: Gastroenterology

## 2022-11-26 ENCOUNTER — Ambulatory Visit: Payer: Medicare Other

## 2022-12-01 ENCOUNTER — Other Ambulatory Visit: Payer: Self-pay | Admitting: Gastroenterology

## 2022-12-03 ENCOUNTER — Emergency Department (HOSPITAL_BASED_OUTPATIENT_CLINIC_OR_DEPARTMENT_OTHER): Payer: Medicare Other

## 2022-12-03 ENCOUNTER — Inpatient Hospital Stay (HOSPITAL_BASED_OUTPATIENT_CLINIC_OR_DEPARTMENT_OTHER)
Admission: EM | Admit: 2022-12-03 | Discharge: 2022-12-07 | DRG: 871 | Disposition: A | Discharge status: Home / Self Care | Payer: Medicare Other | Attending: Family Medicine | Admitting: Family Medicine

## 2022-12-03 ENCOUNTER — Encounter (HOSPITAL_BASED_OUTPATIENT_CLINIC_OR_DEPARTMENT_OTHER): Payer: Self-pay | Admitting: *Deleted

## 2022-12-03 ENCOUNTER — Other Ambulatory Visit: Payer: Self-pay

## 2022-12-03 DIAGNOSIS — I2489 Other forms of acute ischemic heart disease: Secondary | ICD-10-CM | POA: Diagnosis not present

## 2022-12-03 DIAGNOSIS — Z7901 Long term (current) use of anticoagulants: Secondary | ICD-10-CM

## 2022-12-03 DIAGNOSIS — R9431 Abnormal electrocardiogram [ECG] [EKG]: Secondary | ICD-10-CM | POA: Diagnosis not present

## 2022-12-03 DIAGNOSIS — Z87820 Personal history of traumatic brain injury: Secondary | ICD-10-CM

## 2022-12-03 DIAGNOSIS — R6521 Severe sepsis with septic shock: Secondary | ICD-10-CM | POA: Diagnosis not present

## 2022-12-03 DIAGNOSIS — M25511 Pain in right shoulder: Secondary | ICD-10-CM

## 2022-12-03 DIAGNOSIS — N1831 Chronic kidney disease, stage 3a: Secondary | ICD-10-CM | POA: Diagnosis present

## 2022-12-03 DIAGNOSIS — E785 Hyperlipidemia, unspecified: Secondary | ICD-10-CM | POA: Diagnosis present

## 2022-12-03 DIAGNOSIS — I129 Hypertensive chronic kidney disease with stage 1 through stage 4 chronic kidney disease, or unspecified chronic kidney disease: Secondary | ICD-10-CM | POA: Diagnosis present

## 2022-12-03 DIAGNOSIS — R5381 Other malaise: Secondary | ICD-10-CM | POA: Diagnosis present

## 2022-12-03 DIAGNOSIS — Z800 Family history of malignant neoplasm of digestive organs: Secondary | ICD-10-CM

## 2022-12-03 DIAGNOSIS — R918 Other nonspecific abnormal finding of lung field: Secondary | ICD-10-CM | POA: Diagnosis not present

## 2022-12-03 DIAGNOSIS — R0902 Hypoxemia: Secondary | ICD-10-CM | POA: Diagnosis present

## 2022-12-03 DIAGNOSIS — E872 Acidosis, unspecified: Secondary | ICD-10-CM | POA: Diagnosis not present

## 2022-12-03 DIAGNOSIS — R0682 Tachypnea, not elsewhere classified: Secondary | ICD-10-CM | POA: Diagnosis not present

## 2022-12-03 DIAGNOSIS — Z8719 Personal history of other diseases of the digestive system: Secondary | ICD-10-CM

## 2022-12-03 DIAGNOSIS — Z1152 Encounter for screening for COVID-19: Secondary | ICD-10-CM

## 2022-12-03 DIAGNOSIS — A419 Sepsis, unspecified organism: Secondary | ICD-10-CM | POA: Diagnosis not present

## 2022-12-03 DIAGNOSIS — D631 Anemia in chronic kidney disease: Secondary | ICD-10-CM | POA: Diagnosis present

## 2022-12-03 DIAGNOSIS — J439 Emphysema, unspecified: Secondary | ICD-10-CM | POA: Diagnosis not present

## 2022-12-03 DIAGNOSIS — K5010 Crohn's disease of large intestine without complications: Secondary | ICD-10-CM | POA: Diagnosis present

## 2022-12-03 DIAGNOSIS — N390 Urinary tract infection, site not specified: Secondary | ICD-10-CM | POA: Diagnosis not present

## 2022-12-03 DIAGNOSIS — M542 Cervicalgia: Secondary | ICD-10-CM | POA: Diagnosis present

## 2022-12-03 DIAGNOSIS — K8689 Other specified diseases of pancreas: Secondary | ICD-10-CM | POA: Diagnosis not present

## 2022-12-03 DIAGNOSIS — E871 Hypo-osmolality and hyponatremia: Secondary | ICD-10-CM | POA: Diagnosis not present

## 2022-12-03 DIAGNOSIS — Z87440 Personal history of urinary (tract) infections: Secondary | ICD-10-CM

## 2022-12-03 DIAGNOSIS — I4891 Unspecified atrial fibrillation: Secondary | ICD-10-CM | POA: Diagnosis present

## 2022-12-03 DIAGNOSIS — K760 Fatty (change of) liver, not elsewhere classified: Secondary | ICD-10-CM | POA: Diagnosis not present

## 2022-12-03 DIAGNOSIS — K30 Functional dyspepsia: Secondary | ICD-10-CM | POA: Diagnosis not present

## 2022-12-03 DIAGNOSIS — N12 Tubulo-interstitial nephritis, not specified as acute or chronic: Secondary | ICD-10-CM | POA: Diagnosis present

## 2022-12-03 DIAGNOSIS — Z9049 Acquired absence of other specified parts of digestive tract: Secondary | ICD-10-CM

## 2022-12-03 DIAGNOSIS — K7290 Hepatic failure, unspecified without coma: Secondary | ICD-10-CM | POA: Diagnosis not present

## 2022-12-03 DIAGNOSIS — K219 Gastro-esophageal reflux disease without esophagitis: Secondary | ICD-10-CM | POA: Diagnosis present

## 2022-12-03 DIAGNOSIS — I480 Paroxysmal atrial fibrillation: Secondary | ICD-10-CM | POA: Diagnosis present

## 2022-12-03 DIAGNOSIS — E876 Hypokalemia: Secondary | ICD-10-CM | POA: Diagnosis present

## 2022-12-03 DIAGNOSIS — R739 Hyperglycemia, unspecified: Secondary | ICD-10-CM | POA: Diagnosis present

## 2022-12-03 DIAGNOSIS — R6889 Other general symptoms and signs: Secondary | ICD-10-CM | POA: Diagnosis present

## 2022-12-03 DIAGNOSIS — L821 Other seborrheic keratosis: Secondary | ICD-10-CM | POA: Diagnosis present

## 2022-12-03 DIAGNOSIS — R1084 Generalized abdominal pain: Secondary | ICD-10-CM | POA: Diagnosis not present

## 2022-12-03 DIAGNOSIS — R7401 Elevation of levels of liver transaminase levels: Secondary | ICD-10-CM | POA: Diagnosis present

## 2022-12-03 DIAGNOSIS — R6520 Severe sepsis without septic shock: Secondary | ICD-10-CM | POA: Diagnosis present

## 2022-12-03 DIAGNOSIS — K5710 Diverticulosis of small intestine without perforation or abscess without bleeding: Secondary | ICD-10-CM | POA: Diagnosis present

## 2022-12-03 DIAGNOSIS — R109 Unspecified abdominal pain: Secondary | ICD-10-CM | POA: Diagnosis present

## 2022-12-03 DIAGNOSIS — R600 Localized edema: Secondary | ICD-10-CM | POA: Diagnosis present

## 2022-12-03 DIAGNOSIS — Z803 Family history of malignant neoplasm of breast: Secondary | ICD-10-CM

## 2022-12-03 DIAGNOSIS — R162 Hepatomegaly with splenomegaly, not elsewhere classified: Secondary | ICD-10-CM | POA: Diagnosis not present

## 2022-12-03 DIAGNOSIS — M79605 Pain in left leg: Secondary | ICD-10-CM | POA: Diagnosis not present

## 2022-12-03 DIAGNOSIS — R112 Nausea with vomiting, unspecified: Secondary | ICD-10-CM | POA: Diagnosis not present

## 2022-12-03 DIAGNOSIS — Z9181 History of falling: Secondary | ICD-10-CM

## 2022-12-03 DIAGNOSIS — M79604 Pain in right leg: Secondary | ICD-10-CM | POA: Diagnosis present

## 2022-12-03 DIAGNOSIS — K5750 Diverticulosis of both small and large intestine without perforation or abscess without bleeding: Secondary | ICD-10-CM | POA: Diagnosis not present

## 2022-12-03 DIAGNOSIS — K7200 Acute and subacute hepatic failure without coma: Secondary | ICD-10-CM | POA: Diagnosis not present

## 2022-12-03 HISTORY — DX: Palpitations: R00.2

## 2022-12-03 HISTORY — DX: Pain in right shoulder: M25.511

## 2022-12-03 HISTORY — DX: Other ill-defined heart diseases: I51.89

## 2022-12-03 HISTORY — DX: Other specified postprocedural states: Z98.890

## 2022-12-03 HISTORY — DX: Unspecified abdominal pain: R10.9

## 2022-12-03 HISTORY — DX: Localized edema: R60.0

## 2022-12-03 HISTORY — DX: Sepsis, unspecified organism: A41.9

## 2022-12-03 LAB — CBC WITH DIFFERENTIAL/PLATELET
Abs Immature Granulocytes: 0.02 10*3/uL (ref 0.00–0.07)
Basophils Absolute: 0 10*3/uL (ref 0.0–0.1)
Basophils Relative: 0 %
Eosinophils Absolute: 0 10*3/uL (ref 0.0–0.5)
Eosinophils Relative: 0 %
HCT: 34.6 % — ABNORMAL LOW (ref 36.0–46.0)
Hemoglobin: 11 g/dL — ABNORMAL LOW (ref 12.0–15.0)
Immature Granulocytes: 0 %
Lymphocytes Relative: 7 %
Lymphs Abs: 0.5 10*3/uL — ABNORMAL LOW (ref 0.7–4.0)
MCH: 24.7 pg — ABNORMAL LOW (ref 26.0–34.0)
MCHC: 31.8 g/dL (ref 30.0–36.0)
MCV: 77.8 fL — ABNORMAL LOW (ref 80.0–100.0)
Monocytes Absolute: 0.1 10*3/uL (ref 0.1–1.0)
Monocytes Relative: 2 %
Neutro Abs: 6 10*3/uL (ref 1.7–7.7)
Neutrophils Relative %: 91 %
Platelets: 152 10*3/uL (ref 150–400)
RBC: 4.45 MIL/uL (ref 3.87–5.11)
RDW: 16.9 % — ABNORMAL HIGH (ref 11.5–15.5)
WBC: 6.6 10*3/uL (ref 4.0–10.5)
nRBC: 0 % (ref 0.0–0.2)

## 2022-12-03 LAB — URINALYSIS, W/ REFLEX TO CULTURE (INFECTION SUSPECTED)
Bilirubin Urine: NEGATIVE
Glucose, UA: NEGATIVE mg/dL
Hgb urine dipstick: NEGATIVE
Ketones, ur: NEGATIVE mg/dL
Nitrite: POSITIVE — AB
Protein, ur: NEGATIVE mg/dL
Specific Gravity, Urine: 1.033 — ABNORMAL HIGH (ref 1.005–1.030)
pH: 5.5 (ref 5.0–8.0)

## 2022-12-03 LAB — COMPREHENSIVE METABOLIC PANEL
ALT: 123 U/L — ABNORMAL HIGH (ref 0–44)
AST: 248 U/L — ABNORMAL HIGH (ref 15–41)
Albumin: 3.6 g/dL (ref 3.5–5.0)
Alkaline Phosphatase: 134 U/L — ABNORMAL HIGH (ref 38–126)
Anion gap: 13 (ref 5–15)
BUN: 13 mg/dL (ref 8–23)
CO2: 19 mmol/L — ABNORMAL LOW (ref 22–32)
Calcium: 8.8 mg/dL — ABNORMAL LOW (ref 8.9–10.3)
Chloride: 102 mmol/L (ref 98–111)
Creatinine, Ser: 1.05 mg/dL — ABNORMAL HIGH (ref 0.44–1.00)
GFR, Estimated: 54 mL/min — ABNORMAL LOW (ref >60)
Glucose, Bld: 171 mg/dL — ABNORMAL HIGH (ref 70–99)
Potassium: 3.4 mmol/L — ABNORMAL LOW (ref 3.5–5.1)
Sodium: 134 mmol/L — ABNORMAL LOW (ref 135–145)
Total Bilirubin: 1.1 mg/dL (ref 0.3–1.2)
Total Protein: 6.9 g/dL (ref 6.5–8.1)

## 2022-12-03 LAB — MRSA NEXT GEN BY PCR, NASAL: MRSA by PCR Next Gen: NOT DETECTED

## 2022-12-03 LAB — RESP PANEL BY RT-PCR (RSV, FLU A&B, COVID)  RVPGX2
Influenza A by PCR: NEGATIVE
Influenza B by PCR: NEGATIVE
Resp Syncytial Virus by PCR: NEGATIVE
SARS Coronavirus 2 by RT PCR: NEGATIVE

## 2022-12-03 LAB — TROPONIN I (HIGH SENSITIVITY)
Troponin I (High Sensitivity): 17 ng/L (ref <18)
Troponin I (High Sensitivity): 14 ng/L (ref <18)

## 2022-12-03 LAB — LACTIC ACID, PLASMA
Lactic Acid, Venous: 4.7 mmol/L (ref 0.5–1.9)
Lactic Acid, Venous: 3.1 mmol/L (ref 0.5–1.9)

## 2022-12-03 LAB — LIPASE, BLOOD: Lipase: 61 U/L — ABNORMAL HIGH (ref 11–51)

## 2022-12-03 MED ORDER — ACETAMINOPHEN 500 MG PO TABS
1000.0000 mg | ORAL_TABLET | Freq: Four times a day (QID) | ORAL | Status: DC | PRN
  Administered 2022-12-03 – 2022-12-04 (×2): 1000 mg via ORAL
  Filled 2022-12-03 (×2): qty 2

## 2022-12-03 MED ORDER — SODIUM CHLORIDE 0.9 % IV BOLUS
1000.0000 mL | Freq: Once | INTRAVENOUS | Status: AC
  Administered 2022-12-03: 1000 mL via INTRAVENOUS

## 2022-12-03 MED ORDER — MESALAMINE ER 250 MG PO CPCR
1500.0000 mg | ORAL_CAPSULE | Freq: Every day | ORAL | Status: DC
  Administered 2022-12-03 – 2022-12-06 (×4): 1500 mg via ORAL
  Filled 2022-12-03 (×5): qty 6

## 2022-12-03 MED ORDER — SODIUM CHLORIDE 0.9 % IV SOLN: INTRAVENOUS | Status: DC

## 2022-12-03 MED ORDER — ONDANSETRON HCL 4 MG/2ML IJ SOLN
4.0000 mg | Freq: Once | INTRAMUSCULAR | Status: AC
  Administered 2022-12-03: 4 mg via INTRAVENOUS
  Filled 2022-12-03: qty 2

## 2022-12-03 MED ORDER — ENOXAPARIN SODIUM 40 MG/0.4ML IJ SOSY
40.0000 mg | PREFILLED_SYRINGE | INTRAMUSCULAR | Status: DC
  Administered 2022-12-03 – 2022-12-04 (×2): 40 mg via SUBCUTANEOUS
  Filled 2022-12-03 (×2): qty 0.4

## 2022-12-03 MED ORDER — ONDANSETRON 4 MG PO TBDP: 4.0000 mg | ORAL_TABLET | Freq: Four times a day (QID) | ORAL | Status: DC | PRN

## 2022-12-03 MED ORDER — SODIUM CHLORIDE 0.9 % IV SOLN
2.0000 g | INTRAVENOUS | Status: AC
  Administered 2022-12-03 – 2022-12-04 (×2): 2 g via INTRAVENOUS
  Filled 2022-12-03 (×2): qty 20

## 2022-12-03 MED ORDER — PANTOPRAZOLE SODIUM 40 MG PO TBEC
40.0000 mg | DELAYED_RELEASE_TABLET | Freq: Every day | ORAL | Status: DC
  Administered 2022-12-03 – 2022-12-07 (×5): 40 mg via ORAL
  Filled 2022-12-03 (×5): qty 1

## 2022-12-03 MED ORDER — ACETAMINOPHEN 325 MG PO TABS
650.0000 mg | ORAL_TABLET | Freq: Once | ORAL | Status: AC
  Administered 2022-12-03: 650 mg via ORAL
  Filled 2022-12-03: qty 2

## 2022-12-03 MED ORDER — ONDANSETRON HCL 4 MG/2ML IJ SOLN
4.0000 mg | Freq: Four times a day (QID) | INTRAMUSCULAR | Status: DC | PRN
  Administered 2022-12-03 – 2022-12-04 (×2): 4 mg via INTRAVENOUS
  Filled 2022-12-03 (×2): qty 2

## 2022-12-03 MED ORDER — FENTANYL CITRATE PF 50 MCG/ML IJ SOSY
50.0000 ug | PREFILLED_SYRINGE | INTRAMUSCULAR | Status: DC | PRN
  Administered 2022-12-03: 50 ug via INTRAVENOUS
  Filled 2022-12-03: qty 1

## 2022-12-03 MED ORDER — IOHEXOL 350 MG/ML SOLN
100.0000 mL | Freq: Once | INTRAVENOUS | Status: AC | PRN
  Administered 2022-12-03: 100 mL via INTRAVENOUS

## 2022-12-03 MED ORDER — SODIUM CHLORIDE 0.9 % IV BOLUS (SEPSIS)
1000.0000 mL | Freq: Once | INTRAVENOUS | Status: AC
  Administered 2022-12-03: 1000 mL via INTRAVENOUS

## 2022-12-03 MED ORDER — SODIUM CHLORIDE 0.9 % IV BOLUS: 1000.0000 mL | Freq: Once | INTRAVENOUS | Status: DC

## 2022-12-03 NOTE — Assessment & Plan Note (Signed)
>>  ASSESSMENT AND PLAN FOR SEPSIS WITH ACUTE ORGAN DYSFUNCTION WITHOUT SEPTIC SHOCK (HCC) WRITTEN ON 12/03/2022  3:49 PM BY ZHENG, JACKY, MD  Since transferring, patient has intermittent episodes of tachypnea (RR 28) with transient desats (93%), low DBP (46), low grade fever (100.8, improved to 97.5). Sepsis most likely secondary to UTI at this time, will continue to workup.  - Blood Cx fu - Urine Cx fu - CTX as below - Maintenance IVF NS 100ml/hr

## 2022-12-03 NOTE — Assessment & Plan Note (Signed)
Positive UA, with urine culture pending. - CTX 2g/day

## 2022-12-03 NOTE — Assessment & Plan Note (Addendum)
Since transferring, patient has intermittent episodes of tachypnea (RR 28) with transient desats (93%), low DBP (46), low grade fever (100.8, improved to 97.5). Sepsis most likely secondary to UTI at this time, will continue to workup.  - Blood Cx fu - Urine Cx fu - CTX as below - Maintenance IVF NS 168ml/hr

## 2022-12-03 NOTE — Progress Notes (Signed)
Pt being followed by [facility] for Sepsis protocol. 

## 2022-12-03 NOTE — ED Notes (Signed)
Dr. Eudelia Bunch aware of the lactic of 4.7

## 2022-12-03 NOTE — ED Notes (Signed)
Dr. Eudelia Bunch aware of lactic level 3.1

## 2022-12-03 NOTE — Progress Notes (Signed)
FMTS Interim Progress Note  S: Went to bedside to see the patient with Dr. Yetta Barre.  Patient is resting comfortably in bed watching TV.  Was on 2L [place] saturating well.  She complains of some nausea and vomiting earlier in the evening however has not had any more episodes since receiving Zofran.  She endorses some abdominal pain diffusely, limited fluid intake p.o. and urination frequency has decreased, but no complaints of dysuria.  O: BP (!) 141/56 (BP Location: Right Arm)   Pulse (!) 59   Temp 99.2 F (37.3 C) (Oral)   Resp 17   Ht 5\' 3"  (1.6 m)   Wt 68.9 kg   SpO2 100%   BMI 26.91 kg/m   General: awake and alert, NAD CV: RRR. No M/R/G. Warm and well perfused Respiratory: CTAB. No crackles, wheezing or rhonchi noted. MSK: BLE edema, L>R, slight TTP over L, warm, non-erythematous   A/P: Sepsis secondary to UTI Patient is doing better and has no further complaints this evening.  Since patient was saturating well on 2L , decreased O2 to 1L, patient tolerated well and maintained oxygen and saturation in the lower 90s. - Advised patient to stay hydrated - Continue maintenance IV fluids - Zofran q6 as needed - Continue ceftriaxone  Lower extremity edema - LLE Doppler US pending  Rest of plan per day team H&P.  Fortunato Curling, DO 12/03/2022, 9:32 PM PGY-1, [facility] Family Medicine Service pager [phone]

## 2022-12-03 NOTE — ED Provider Notes (Signed)
[place] EMERGENCY DEPARTMENT AT [facility] Provider Note  CSN: [id number] Arrival date & time: 12/03/22 0226  Chief Complaint(s) Abdominal Pain  HPI Diane Freeman is a 77 y.o. female with a past medical history listed below including Crohn's colitis on mesalamine, paroxysmal A-fib not on anticoagulation, GERD, who presents to the emergency department with generalized abdominal pain with several bouts of nausea and nonbloody nonbilious emesis.  Pain began around 11:30 PM.  Patient felt as though she needed to have a bowel movement but was unable to.  Reports that she had a small bowel movement earlier in the evening.  Still passing gas from below.  She denies any recent bloody or melanotic bowel movements.  She denies any associated chest pain or shortness of breath.  Denies any recent fevers or infections.  No known suspicious food intake.  Patient last ate around 3 PM.  The history is provided by the patient.    Past Medical History Past Medical History:  Diagnosis Date   Adrenal nodule (HCC)    Anemia    Blood transfusion without reported diagnosis    Cataract    bil removed   Choledocholithiasis with obstruction    Colitis    Crohn's colitis (HCC) 07/11/2012   Diverticulosis    Fatty liver    GERD (gastroesophageal reflux disease)    Hiatal hernia    History of esophageal stricture    Hx of gastritis    Pyelonephritis 05/29/2022   Patient Active Problem List   Diagnosis Date Noted   Sepsis secondary to UTI (HCC) 12/03/2022   Elevated blood pressure reading 02/21/2021   Actinic keratoses 02/17/2018   Hyperlipidemia 01/24/2018   Shortness of breath 08/05/2014   Diastolic CHF (HCC) 06/14/2013   Crohn's disease (HCC) 06/10/2013   ESOPHAGEAL STRICTURE 11/27/2007   Home Medication(s) Prior to Admission medications   Medication Sig Start Date End Date Taking? Authorizing Provider  Cholecalciferol (VITAMIN D-3 PO) Take 1 capsule by mouth daily.    [provider]  mesalamine (APRISO) 0.375 g 24 hr capsule Take 4 capsules (1.5 g total) by mouth daily. Please schedule a follow up visit for further refills 11/07/22   Armbruster, Willaim Rayas, MD                                                                                                                                    Allergies Patient has no known allergies.  Review of Systems Review of Systems As noted in HPI  Physical Exam Vital Signs  I have reviewed the triage vital signs BP (!) 114/48   Pulse 71   Temp (!) 97.5 F (36.4 C) (Oral)   Resp 20   Ht 5\' 3"  (1.6 m)   Wt 68 kg   SpO2 94%   BMI 26.57 kg/m   Physical Exam Vitals reviewed.  Constitutional:      General: She is not in  acute distress.    Appearance: She is well-developed. She is not diaphoretic.  HENT:     Head: Normocephalic and atraumatic.     Right Ear: External ear normal.     Left Ear: External ear normal.     Nose: Nose normal.  Eyes:     General: No scleral icterus.    Conjunctiva/sclera: Conjunctivae normal.  Neck:     Trachea: Phonation normal.  Cardiovascular:     Rate and Rhythm: Rhythm regularly irregular.  Pulmonary:     Effort: Pulmonary effort is normal. No respiratory distress.     Breath sounds: No stridor.  Abdominal:     General: There is no distension.     Tenderness: There is abdominal tenderness in the right lower quadrant, periumbilical area, suprapubic area, left upper quadrant and left lower quadrant. There is no guarding or rebound.  Musculoskeletal:        General: Normal range of motion.     Cervical back: Normal range of motion.  Neurological:     Mental Status: She is alert and oriented to person, place, and time.  Psychiatric:        Behavior: Behavior normal.     ED Results and Treatments Labs (all labs ordered are listed, but only abnormal results are displayed) Labs Reviewed  COMPREHENSIVE METABOLIC PANEL - Abnormal; Notable for the following components:       Result Value   Sodium 134 (*)    Potassium 3.4 (*)    CO2 19 (*)    Glucose, Bld 171 (*)    Creatinine, Ser 1.05 (*)    Calcium 8.8 (*)    AST 248 (*)    ALT 123 (*)    Alkaline Phosphatase 134 (*)    GFR, Estimated 54 (*)    All other components within normal limits  LIPASE, BLOOD - Abnormal; Notable for the following components:   Lipase 61 (*)    All other components within normal limits  CBC WITH DIFFERENTIAL/PLATELET - Abnormal; Notable for the following components:   Hemoglobin 11.0 (*)    HCT 34.6 (*)    MCV 77.8 (*)    MCH 24.7 (*)    RDW 16.9 (*)    Lymphs Abs 0.5 (*)    All other components within normal limits  LACTIC ACID, PLASMA - Abnormal; Notable for the following components:   Lactic Acid, Venous 4.7 (*)    All other components within normal limits  URINALYSIS, W/ REFLEX TO CULTURE (INFECTION SUSPECTED) - Abnormal; Notable for the following components:   Specific Gravity, Urine 1.033 (*)    Nitrite POSITIVE (*)    Leukocytes,Ua MODERATE (*)    Bacteria, UA MANY (*)    All other components within normal limits  RESP PANEL BY RT-PCR (RSV, FLU A&B, COVID)  RVPGX2  CULTURE, BLOOD (ROUTINE X 2)  CULTURE, BLOOD (ROUTINE X 2)  LACTIC ACID, PLASMA  EKG  EKG Interpretation Date/Time:  Monday December 03 2022 02:38:49 EDT Ventricular Rate:  73 PR Interval:  109 QRS Duration:  84 QT Interval:  386 QTC Calculation: 426 R Axis:   1  Text Interpretation: Sinus or ectopic atrial rhythm Atrial premature complex Short PR interval Low voltage, precordial leads Abnormal R-wave progression, early transition Borderline repolarization abnormality Confirmed by Drema Pry [id number]) on 12/03/2022 2:47:08 AM       Radiology CT Angio Chest/Abd/Pel for Dissection W and/or Wo Contrast  Result Date: 12/03/2022 CLINICAL DATA:  77 year old female with abdominal  pain since 2300 hours. EXAM: CT ANGIOGRAPHY CHEST, ABDOMEN AND PELVIS TECHNIQUE: Non-contrast CT of the chest was initially obtained. Multidetector CT imaging through the chest, abdomen and pelvis was performed using the standard protocol during bolus administration of intravenous contrast. Multiplanar reconstructed images and MIPs were obtained and reviewed to evaluate the vascular anatomy. RADIATION DOSE REDUCTION: This exam was performed according to the departmental dose-optimization program which includes automated exposure control, adjustment of the mA and/or kV according to patient size and/or use of iterative reconstruction technique. CONTRAST:  OMNIPAQUE IOHEXOL 350 MG/ML SOLN COMPARISON:  Calcified aortic atherosclerosis. Calcified coronary artery atherosclerosis (series 4, image 36). No cardiomegaly. And following contrast the thoracic aorta is negative for dissection or aneurysm. There is mild aortic tortuosity. Central pulmonary arteries are also enhancing and appear to be patent. FINDINGS: CTA CHEST FINDINGS Cardiovascular: Moderate to large gastric hiatal hernia, chronic but increased since 2015. Mediastinum is otherwise negative for mass or lymphadenopathy. Mediastinum/Nodes: Lower lung volumes compared to 2015 with bilateral dependent atelectasis. Major airways remain patent. Additional atelectasis associated with the hiatal hernia. No pleural effusion or consolidation. In the upper lungs coarsening of the interstitial pattern suggests centrilobular emphysema now (series 7, image 40). No areas suspicious for pulmonary inflammation. Lungs/Pleura: Osteopenia. Maintained thoracic vertebral height. No acute osseous abnormality identified. Coarse and benign appearing calcification of the medial right breast on series 7, image 73. Musculoskeletal: Review of the MIP images confirms the above findings. CTA ABDOMEN AND PELVIS FINDINGS VASCULAR Aortoiliac calcified atherosclerosis. Mildly tortuous  abdominal aorta but negative for abdominal aortic aneurysm. No dissection. Major arterial structures in the abdomen remain patent. Bilateral major iliac arteries and visible proximal femoral arteries are patent. Review of the MIP images confirms the above findings. NON-VASCULAR Hepatobiliary: Chronic cholecystectomy. Stable liver, borderline to mild hepatomegaly which is nonspecific. Pancreas: Partial fatty atrophy of the pancreas. No definite acute finding. Spleen: Borderline splenomegaly. Estimated splenic volume now is 400 mL (normal splenic volume range 83 - 412 mL). Spleen size is larger since 2015 but not significantly changed from February. No discrete splenic lesion. Adrenals/Urinary Tract: Negative adrenal glands. Stable kidneys, chronic right renal midpole parapelvic cyst has been present since at least 2015. No evidence of obstructive uropathy or renal inflammation. Stomach/Bowel: Decompressed rectum. Diverticulosis of the sigmoid colon. No active inflammation is evident. Decompressed descending colon. Minimal large bowel diverticula upstream of the sigmoid. Mildly featureless appearance of the upstream large bowel, transverse and right colon as seen on series 5, image 176. But no mesenteric inflammation. Retained stool in those segments. Cecum is chronically on a lax mesentery. Appendix remains diminutive. Some flocculated material in the terminal ileum which otherwise appears negative. Nondilated small bowel. Decompressed intra-abdominal portion of the stomach. No free air or free fluid identified. Distal duodenal diverticulum is small and stable with no active inflammation on series 5, image 168. Lymphatic: No lymphadenopathy. Reproductive: Negative. Other: No pelvis free fluid.  Musculoskeletal: Osteopenia. Maintained lumbar vertebral height. Chronic disc degeneration with vacuum disc. No acute osseous abnormality identified. Review of the MIP images confirms the above findings. IMPRESSION: 1. Aortic  Atherosclerosis (ICD10-I70.0). Negative for aortic aneurysm or dissection. 2. No definite acute or inflammatory process identified in the chest, abdomen, or pelvis: - moderate to large gastric hiatal hernia, chronic but increased since 2015. - suspect Emphysema (ICD10-J43.9) has developed since 2015. - borderline Hepatosplenomegaly , nonspecific. - occasional large and small bowel diverticula. No convincing bowel inflammation. Electronically Signed   By: Odessa Fleming M.D.   On: 12/03/2022 04:45    Medications Ordered in ED Medications  fentaNYL (SUBLIMAZE) injection 50 mcg (50 mcg Intravenous Given 12/03/22 0256)  sodium chloride 0.9 % bolus 1,000 mL (0 mLs Intravenous Stopped 12/03/22 0332)    And  0.9 %  sodium chloride infusion (0 mLs Intravenous Stopped 12/03/22 0511)  cefTRIAXone (ROCEPHIN) 2 g in sodium chloride 0.9 % 100 mL IVPB (2 g Intravenous New Bag/Given 12/03/22 0509)  ondansetron (ZOFRAN) injection 4 mg (4 mg Intravenous Given 12/03/22 0255)  iohexol (OMNIPAQUE) 350 MG/ML injection 100 mL (100 mLs Intravenous Contrast Given 12/03/22 0430)  sodium chloride 0.9 % bolus 1,000 mL (1,000 mLs Intravenous New Bag/Given 12/03/22 0510)   Procedures .1-3 Lead EKG Interpretation  Performed by: Nira Conn, MD Authorized by: Nira Conn, MD     Interpretation: abnormal     ECG rate:  70-90   ECG rate assessment: normal     Rhythm comment:  Fluctuating b/w NSR and atrial fib   Ectopy: PAC     Conduction: normal   .Critical Care  Performed by: Nira Conn, MD Authorized by: Nira Conn, MD   Critical care provider statement:    Critical care time (minutes):  65   Critical care time was exclusive of:  Separately billable procedures and treating other patients   Critical care was necessary to treat or prevent imminent or life-threatening deterioration of the following conditions:  Sepsis and shock   Critical care was time spent personally by me on the following  activities:  Development of treatment plan with patient or surrogate, discussions with consultants, evaluation of patient's response to treatment, examination of patient, obtaining history from patient or surrogate, review of old charts, re-evaluation of patient's condition, pulse oximetry, ordering and review of radiographic studies, ordering and review of laboratory studies and ordering and performing treatments and interventions   Care discussed with: admitting provider     (including critical care time) Medical Decision Making / ED Course   Medical Decision Making Amount and/or Complexity of Data Reviewed Labs: ordered. Decision-making details documented in ED Course. Radiology: ordered and independent interpretation performed. Decision-making details documented in ED Course. ECG/medicine tests: ordered and independent interpretation performed. Decision-making details documented in ED Course.  Risk Prescription drug management. Parenteral controlled substances. Decision regarding hospitalization.    Generalized abdominal pain with nausea and vomiting.  Differential diagnosis considered. Functional intestinal colic, dyspepsia, gastritis, pancreatitis, biliary disease, Crohn's flare, colitis, bowel obstruction, mesenteric ischemia. Will also rule out UTI given h/o pyelonephritis in Feb.  EKG ordered given irregular rhythm.  Appears to be sinus rhythm with PACs.  Patient provided with IV fluids and antiemetics.  Clinical Course as of 12/03/22 0612  Mon Dec 03, 2022  0334 CBC without leukocytosis.  Mild anemia with stable hemoglobin.  CMP with mild hyponatremia and hypokalemia.  Hyperglycemia without DKA.  Stable renal function.  Evidence of acidosis and elevated  LFTs.  Normal bilirubin - not suspicious for biliary obstruction Slightly elevated lipase but not consistent with acute pancreatitis.  Elevated lactic acid of 4.7 - presentation is more concerning for ischemic bowel rather  than sepsis at this time.  [PC]  0454 CT negative for evidence of ischemic bowel, intestinal inflammatory/infectious process.  UA consistent with a urinary tract infection.  Now that lactic acid is confirmed to be from sepsis.  Code sepsis will be initiated and patient will be started on empiric antibiotics.  Will give additional IV fluids to meet 30 cc/kg of IBW of 55kg.  [PC]  1610 Will call FMR team since she is an established patient.   [PC]  [phone] Spoke with resident - per protocol, need to consult hospitalist team for admission. Patient care can be transferred to [facility] if she is transferred to [facility].  [PC]  [phone] Paged Hospitalist  [PC]  [phone] Accepted by Dr. Julian Reil [PC]    Clinical Course User Index [PC] Quantay Zaremba, Amadeo Garnet, MD    Final Clinical Impression(s) / ED Diagnoses Final diagnoses:  Sepsis with acute liver failure and septic shock without hepatic coma, due to unspecified organism [facility])  Pyelonephritis    This chart was dictated using voice recognition software.  Despite best efforts to proofread,  errors can occur which can change the documentation meaning.    Nira Conn, MD 12/03/22 [phone]

## 2022-12-03 NOTE — Assessment & Plan Note (Addendum)
No chest pain or significant SOB, with reassuring EKG in ED.  - Trops to rule out ischemia

## 2022-12-03 NOTE — ED Triage Notes (Addendum)
Pt arrived via [facility] with c/o general abd pain that started around 2300 when she got up to the bathroom.  States she has had n/v. Denies any urinary symptoms. Has not taken anything for pain. Denies any blood in her stools. Pt's HR is irregular from 38-77. EKG obtained.  Dr. Eudelia Bunch at bedside on arrival.

## 2022-12-03 NOTE — ED Notes (Signed)
Pt placed on [place] 2 Lpm d/t desaturation post medication administration. RT will continue to monitor while at [facility].    12/03/22 0306  Oxygen Therapy/Pulse Ox  O2 Device Nasal Cannula  O2 Therapy Oxygen  O2 Flow Rate (L/min) 2 L/min  FiO2 (%) 28 %

## 2022-12-03 NOTE — Hospital Course (Addendum)
Diane Freeman is a 77 y.o.female with a history of Crohn's on mesalamine, paroxysmal A-fib, GERD who was admitted to the [facility] Medicine teaching Service at [facility] for sepsis secondary to UTI. Her hospital course is detailed below:  Major Problems Addressed During Admission:  UTI  Sepsis Patient went to [facility] ED with 1 day of abdominal pain and nonbloody, nonbilious emesis.  Presented with low diastolic blood pressures to the 40s, febrile, elevated lactate to 4.7 which improved to 3.1, with UA positive for nitrites and leukocytes likely due to sepsis secondary to UTI.  Patient was given IVF.  Blood culture showed no growth after two days.  Patient has previously grown E coli and Group B strep.  Patient was started on ceftriaxone 2 g/day for 2 days with transition to cefadroxil for 3 days. She was discharged with 1 more dose. (Total ABX course of 5 days).  Abdominal Pain Likely secondary to UTI versus other GI pathology given lack of other GI symptoms and absence of sick contacts.  Electrolytes remained stable for her but LFTs were elevated.  Hepatitis panel showed no reactivity (or immunity) to hepatitis antigens.  Treated patient's pain with Tylenol or Oxy PRN and Zofran as needed for nausea and vomiting. Abdominal pain had resolved by 9/5.  Paroxysmal atrial fibrillation w/ RVR Pt had paroxysmal atrial fibrillation during admission. Patient mentioned that she has had palpitations noticed by a doctor in the past, but that she has never been treated for them. Cardiology and EP consulted. She received 2 doses of IV metoprolol 2.5 mg w/in a few mins of each other.  Rates improved into the 90's, but she also had pauses of up to 7.56 sec pauses earlier this evening, likely assoc w/ presyncope. Metoprolol added as a contraindication for now. Echo showed preserved EF, grossly normal function and anatomy of the heart. She was started on heparin and transitioned to eliquis for anticoagulation. Pt started  on amiodarone for rhythm control. Follow-on outpatient with Cardiology in October.  BLE Edema Lower extremity bilateral edema and TTP L>R, warm, not erythematous.  LLE Doppler US showed no signs of recent DVT. Edema resolved by 9/4.  Other chronic conditions were medically managed with home medications and formulary alternatives as necessary: Crohns: Continued home Mesalamine GERD: Continued formulary equivalent of home omeprazole  PCP Follow-up Recommendations: Follow up with A-Fib clinic in 3 months to possibly stop amiodarone. Amio 400 daily x42mon (9/4- 10/09), then 200 daily after follow up with cardiology Hyponatremia during admission, recheck BMP outpatient with patient baseline 131 Mild drop in Hgb in days since starting anti-coag. Recheck H&H.

## 2022-12-03 NOTE — ED Notes (Signed)
[facility] at [facility] will send transport for [facility] Ready at [facility] 2C RM#14.-ABB(NS)

## 2022-12-03 NOTE — Assessment & Plan Note (Signed)
May be lymphedema vs thrombosis. - LLE doppler US ordered to rule out DVT

## 2022-12-03 NOTE — Assessment & Plan Note (Addendum)
Abdominal pain may be secondary to UTI vs other GI pathology.  - CMP, Hepatitis Panel, Hep B surf antigen tomorrow AM - Tylenol PRN for pain - Consider oxy 5mg  prn for breakthrough pain - GPP if symptoms worsen

## 2022-12-03 NOTE — H&P (Addendum)
Hospital Admission History and Physical Service Pager: [phone]  Patient name: Diane Freeman Medical record number: [id number] Date of Birth: [birth year] Age: 77 y.o. Gender: female  Primary Care Provider: Para March, DO Consultants:  Code Status: FULL  Preferred Emergency Contact: [facility] Information     Name Relation Home Work Mobile   Jessup,Patty Sister [phone]  [phone]      Other Contacts   None on File    Chief Complaint: Abdominal Pain  Vomiting   Assessment and Plan: Diane Freeman is a 77 y.o. female presenting with abdominal pain and vomiting. Differential for presentation of this includes sepsis secondary to UTI vs other infectious source, pancreatitis, hepatitis, thrombophlebitis, or dehydration secondary to viral GI illness vs food poisoning. Most likely secondary to UTI given UA findings and symptoms. Possibly pancreatitis or hepatitis given elevated lipase and transaminases though not significantly elevated. Possibly thrombophlebitis given LE swelling L>R though LLE appears warm and non-erythematous. Less likely dehydration secondary to GI virus vs food poisoning given lack of other GI symptoms and absence of sick contacts.   Assessment & Plan Sepsis (HCC) Since transferring, patient has intermittent episodes of tachypnea (RR 28) with transient desats (93%), low DBP (46), low grade fever (100.8, improved to 97.5). Sepsis most likely secondary to UTI at this time, will continue to workup.  - Blood Cx fu - Urine Cx fu - CTX as below - Maintenance IVF NS 162ml/hr  Abdominal pain Abdominal pain may be secondary to UTI vs other GI pathology.  - CMP, Hepatitis Panel, Hep B surf antigen tomorrow AM - Tylenol PRN for pain - Consider oxy 5mg  prn for breakthrough pain - GPP if symptoms worsen   UTI (urinary tract infection) Positive UA, with urine culture pending. - CTX 2g/day Lower extremity edema May be lymphedema vs  thrombosis. - LLE doppler US ordered to rule out DVT Right shoulder pain No chest pain or significant SOB, with reassuring EKG in ED.  - Trops to rule out ischemia   Chronic and Stable Problems:  Crohns: Cont home mesalamine GERD: Cont formulary equivalent of home omeprazole  FEN/GI: Reg diet VTE Prophylaxis: Lovenox  Disposition: Admit to floor   History of Present Illness:  Diane Freeman is a 77 y.o. female PMHx Crohn's on mesalamine, pAFib, GERD presenting with 1 day of abdominal pain and NBNB emesis.   Patient started experiencing abdominal pain last evening and threw up multiple times. Emesis was non-bloody, non-bilious, and without coffee ground appearance.  She notes having infrequent episodes of vomiting at baseline and usually takes something for indigestion. Last bowel movement last night, no recent diarrhea or constipation. She notes eating some deli meat yesterday but otherwise no significant food changes. She reports increased urinary frequency, no dysuria and no gross hematuria.  She last had a UTI in February and feels this episode may be similar.  No recent fevers, chills, or night sweats. No recent skin changes, rashes, or ulcers.  She notes developing a mild cough at the outside ED earlier today but feels this has improved since arriving at [facility].  While FMTS in room, patient noted some R shoulder and neck pain with some relief with bed elevation. No CP, SOB.  Brief [facility] ED course: DBP down to 40s, highest temp 101.4. CTAP and Chest showed no acute inflammation or infectious process. UA with positive nitritates and leukocytes. Lipase 61, AST 248, ALT 123, alk phos 134. K 3.4, Cr 1.05. Lactate  4.7>3.1. Blood cx drawn prior to abx. Patient started on CTX and IVF and transferred to [facility] for further treatment.   Review Of Systems: As noted above in HPI  Pertinent Past Medical History: Crohns  pAFib GERD  Remainder reviewed in history tab.   Pertinent  Past Surgical History: Cardiac catheterization (05/2013) Cholecystectomy (2009) ERCP (2013)   Pertinent Social History: Tobacco use: No Alcohol use: No Other Substance use: No active use Lives alone.  Completes own ADLs.  Pertinent Family History: Sister (alive) - breast cancer Sister (passed) - stomach cancer  Maternal Aunt (passed) - colon cancer Maternal Grandma - bone cancer  Important Outpatient Medications: Mesalamine Indigestion pills likely OTC  Objective: BP 120/70 (BP Location: Right Arm)   Pulse 94   Temp (!) 97.5 F (36.4 C) (Oral)   Resp (!) 28   Ht 5\' 3"  (1.6 m)   Wt 68.9 kg   SpO2 93%   BMI 26.91 kg/m  Exam: General: Resting in bed, no acute distress Eyes: Sclera without injection or icterus. Cardiovascular: Normal S1/S2. No extra heart sounds. Warm and well-perfused Respiratory: Breathing comfortably on room air. Normal bilateral breath sounds. No increased WOB.  Gastrointestinal: Soft, non-distended. Tender to palpation of lower quadrants. No rebound or guarding. No CVA tenderness.   MSK: Lower extremity edema bilaterally L>R, tender to palpation L>R. Warm, non-erythematous  Derm: Flat, erythematous patch and1.5 cm raised, hyperpigmented lesion consistent with seborrheic keratosis beneath L breast.  Neuro: Alert and appropriate  Labs:  CBC BMET  Recent Labs  Lab 12/03/22 0251  WBC 6.6  HGB 11.0*  HCT 34.6*  PLT 152   Recent Labs  Lab 12/03/22 0251  NA 134*  K 3.4*  CL 102  CO2 19*  BUN 13  CREATININE 1.05*  GLUCOSE 171*  CALCIUM 8.8*    Lipase 61, AST 248, ALT 123, alk phos 134.  K 3.4, Cr 1.05.  Lactate 4.7>3.1.   EKG: Sinus vs ectopic atrial rhythm. No signs of ischemia. Normal Qtc (426).  Imaging Studies Performed: CTAP and Chest showed no acute inflammation or infectious process.   Ivery Quale, PGY1 Family Medicine

## 2022-12-04 ENCOUNTER — Inpatient Hospital Stay (HOSPITAL_COMMUNITY): Payer: Medicare Other

## 2022-12-04 ENCOUNTER — Encounter (HOSPITAL_COMMUNITY): Payer: Medicare Other

## 2022-12-04 ENCOUNTER — Encounter (HOSPITAL_COMMUNITY): Payer: Self-pay | Admitting: Family Medicine

## 2022-12-04 DIAGNOSIS — M79605 Pain in left leg: Secondary | ICD-10-CM | POA: Diagnosis not present

## 2022-12-04 DIAGNOSIS — M79604 Pain in right leg: Secondary | ICD-10-CM | POA: Diagnosis present

## 2022-12-04 DIAGNOSIS — R0902 Hypoxemia: Secondary | ICD-10-CM

## 2022-12-04 DIAGNOSIS — I4891 Unspecified atrial fibrillation: Secondary | ICD-10-CM

## 2022-12-04 DIAGNOSIS — R6521 Severe sepsis with septic shock: Secondary | ICD-10-CM

## 2022-12-04 DIAGNOSIS — K7200 Acute and subacute hepatic failure without coma: Secondary | ICD-10-CM

## 2022-12-04 DIAGNOSIS — N390 Urinary tract infection, site not specified: Secondary | ICD-10-CM | POA: Diagnosis not present

## 2022-12-04 DIAGNOSIS — A419 Sepsis, unspecified organism: Secondary | ICD-10-CM | POA: Diagnosis not present

## 2022-12-04 LAB — HEPATITIS PANEL, ACUTE
HCV Ab: NONREACTIVE
Hep A IgM: NONREACTIVE
Hep B C IgM: NONREACTIVE
Hepatitis B Surface Ag: NONREACTIVE

## 2022-12-04 LAB — LACTATE DEHYDROGENASE: LDH: 122 U/L (ref 98–192)

## 2022-12-04 LAB — COMPREHENSIVE METABOLIC PANEL
ALT: 86 U/L — ABNORMAL HIGH (ref 0–44)
AST: 92 U/L — ABNORMAL HIGH (ref 15–41)
Albumin: 2.5 g/dL — ABNORMAL LOW (ref 3.5–5.0)
Alkaline Phosphatase: 102 U/L (ref 38–126)
Anion gap: 13 (ref 5–15)
BUN: 15 mg/dL (ref 8–23)
CO2: 15 mmol/L — ABNORMAL LOW (ref 22–32)
Calcium: 8 mg/dL — ABNORMAL LOW (ref 8.9–10.3)
Chloride: 105 mmol/L (ref 98–111)
Creatinine, Ser: 1.14 mg/dL — ABNORMAL HIGH (ref 0.44–1.00)
GFR, Estimated: 49 mL/min — ABNORMAL LOW (ref >60)
Glucose, Bld: 96 mg/dL (ref 70–99)
Potassium: 3.8 mmol/L (ref 3.5–5.1)
Sodium: 133 mmol/L — ABNORMAL LOW (ref 135–145)
Total Bilirubin: 2.7 mg/dL — ABNORMAL HIGH (ref 0.3–1.2)
Total Protein: 6 g/dL — ABNORMAL LOW (ref 6.5–8.1)

## 2022-12-04 LAB — CBC
HCT: 31.3 % — ABNORMAL LOW (ref 36.0–46.0)
HCT: 27.8 % — ABNORMAL LOW (ref 36.0–46.0)
Hemoglobin: 9.2 g/dL — ABNORMAL LOW (ref 12.0–15.0)
Hemoglobin: 8.9 g/dL — ABNORMAL LOW (ref 12.0–15.0)
MCH: 24.1 pg — ABNORMAL LOW (ref 26.0–34.0)
MCH: 24.9 pg — ABNORMAL LOW (ref 26.0–34.0)
MCHC: 29.4 g/dL — ABNORMAL LOW (ref 30.0–36.0)
MCHC: 32 g/dL (ref 30.0–36.0)
MCV: 82.2 fL (ref 80.0–100.0)
MCV: 77.9 fL — ABNORMAL LOW (ref 80.0–100.0)
Platelets: 103 10*3/uL — ABNORMAL LOW (ref 150–400)
Platelets: 118 10*3/uL — ABNORMAL LOW (ref 150–400)
RBC: 3.81 MIL/uL — ABNORMAL LOW (ref 3.87–5.11)
RBC: 3.57 MIL/uL — ABNORMAL LOW (ref 3.87–5.11)
RDW: 17.5 % — ABNORMAL HIGH (ref 11.5–15.5)
RDW: 17.2 % — ABNORMAL HIGH (ref 11.5–15.5)
WBC: 9.3 10*3/uL (ref 4.0–10.5)
WBC: 7.9 10*3/uL (ref 4.0–10.5)
nRBC: 0 % (ref 0.0–0.2)
nRBC: 0 % (ref 0.0–0.2)

## 2022-12-04 LAB — PROTIME-INR
INR: 1.4 — ABNORMAL HIGH (ref 0.8–1.2)
Prothrombin Time: 16.9 s — ABNORMAL HIGH (ref 11.4–15.2)

## 2022-12-04 LAB — MAGNESIUM: Magnesium: 1.6 mg/dL — ABNORMAL LOW (ref 1.7–2.4)

## 2022-12-04 LAB — IRON AND TIBC
Iron: 15 ug/dL — ABNORMAL LOW (ref 28–170)
Saturation Ratios: 3 % — ABNORMAL LOW (ref 10.4–31.8)
TIBC: 447 ug/dL (ref 250–450)
UIBC: 432 ug/dL

## 2022-12-04 LAB — FERRITIN: Ferritin: 46 ng/mL (ref 11–307)

## 2022-12-04 LAB — BILIRUBIN, FRACTIONATED(TOT/DIR/INDIR)
Bilirubin, Direct: 1.8 mg/dL — ABNORMAL HIGH (ref 0.0–0.2)
Indirect Bilirubin: 0.8 mg/dL (ref 0.3–0.9)
Total Bilirubin: 2.6 mg/dL — ABNORMAL HIGH (ref 0.3–1.2)

## 2022-12-04 LAB — HEPATITIS B SURFACE ANTIBODY,QUALITATIVE: Hep B S Ab: NONREACTIVE

## 2022-12-04 LAB — FOLATE: Folate: 9 ng/mL (ref >5.9)

## 2022-12-04 LAB — RETICULOCYTES
Immature Retic Fract: 14.8 % (ref 2.3–15.9)
RBC.: 3.55 MIL/uL — ABNORMAL LOW (ref 3.87–5.11)
Retic Count, Absolute: 40.1 10*3/uL (ref 19.0–186.0)
Retic Ct Pct: 1.1 % (ref 0.4–3.1)

## 2022-12-04 LAB — TSH: TSH: 0.553 u[IU]/mL (ref 0.350–4.500)

## 2022-12-04 LAB — TROPONIN I (HIGH SENSITIVITY)
Troponin I (High Sensitivity): 18 ng/L — ABNORMAL HIGH (ref <18)
Troponin I (High Sensitivity): 19 ng/L — ABNORMAL HIGH (ref <18)

## 2022-12-04 LAB — VITAMIN B12: Vitamin B-12: 229 pg/mL (ref 180–914)

## 2022-12-04 MED ORDER — METOPROLOL TARTRATE 25 MG PO TABS: 25.0000 mg | ORAL_TABLET | Freq: Two times a day (BID) | ORAL | Status: DC

## 2022-12-04 MED ORDER — MAGNESIUM SULFATE IN D5W 1-5 GM/100ML-% IV SOLN
1.0000 g | Freq: Once | INTRAVENOUS | Status: AC
  Administered 2022-12-04: 1 g via INTRAVENOUS
  Filled 2022-12-04: qty 100

## 2022-12-04 MED ORDER — CEFADROXIL 500 MG PO CAPS
1000.0000 mg | ORAL_CAPSULE | Freq: Two times a day (BID) | ORAL | Status: DC
  Administered 2022-12-05 – 2022-12-07 (×5): 1000 mg via ORAL
  Filled 2022-12-04 (×6): qty 2

## 2022-12-04 MED ORDER — METOPROLOL TARTRATE 12.5 MG HALF TABLET
12.5000 mg | ORAL_TABLET | Freq: Two times a day (BID) | ORAL | Status: DC
  Administered 2022-12-04: 12.5 mg via ORAL
  Filled 2022-12-04: qty 1

## 2022-12-04 MED ORDER — POTASSIUM CHLORIDE 10 MEQ/100ML IV SOLN
10.0000 meq | INTRAVENOUS | Status: AC
  Administered 2022-12-04 (×4): 10 meq via INTRAVENOUS
  Filled 2022-12-04 (×4): qty 100

## 2022-12-04 MED ORDER — METOPROLOL TARTRATE 5 MG/5ML IV SOLN
2.5000 mg | INTRAVENOUS | Status: AC | PRN
  Administered 2022-12-04 (×2): 2.5 mg via INTRAVENOUS
  Filled 2022-12-04: qty 5

## 2022-12-04 MED ORDER — HEPARIN BOLUS VIA INFUSION
2000.0000 [IU] | Freq: Once | INTRAVENOUS | Status: AC
  Administered 2022-12-04: 2000 [IU] via INTRAVENOUS
  Filled 2022-12-04: qty 2000

## 2022-12-04 MED ORDER — POTASSIUM CHLORIDE CRYS ER 20 MEQ PO TBCR
40.0000 meq | EXTENDED_RELEASE_TABLET | Freq: Once | ORAL | Status: DC
  Filled 2022-12-04: qty 2

## 2022-12-04 MED ORDER — HEPARIN (PORCINE) 25000 UT/250ML-% IV SOLN
1150.0000 [IU]/h | INTRAVENOUS | Status: AC
  Administered 2022-12-04: 950 [IU]/h via INTRAVENOUS
  Filled 2022-12-04: qty 250

## 2022-12-04 MED ORDER — ALUM & MAG HYDROXIDE-SIMETH 200-200-20 MG/5ML PO SUSP
15.0000 mL | ORAL | Status: DC | PRN
  Administered 2022-12-04: 15 mL via ORAL
  Filled 2022-12-04: qty 30

## 2022-12-04 NOTE — TOC Initial Note (Signed)
Transition of Care [facility]) - Initial/Assessment Note    Patient Details  Name: Diane Freeman MRN: [id number] Date of Birth: Apr 02, 1946  Transition of Care [facility]) CM/SW Contact:    Leone Haven, RN Phone Number: 12/04/2022, 4:03 PM  Clinical Narrative:                 From home alone, has PCP and insurance on file, states has no HH services in place at this time, she has a cane and a walker which belonged to her boyfriend.   States sister will transport her home at dc and she is support system, states gets [facility] [place].  Pta self ambulatory.  Expected Discharge Plan: Home/Self Care Barriers to Discharge: Continued Medical Work up   Patient Goals and CMS Choice Patient states their goals for this hospitalization and ongoing recovery are:: return home   Choice offered to / list presented to : NA      Expected Discharge Plan and Services In-house Referral: NA Discharge Planning Services: CM Consult Post Acute Care Choice: NA Living arrangements for the past 2 months: Single Family Home                 DME Arranged: N/A DME Agency: NA       HH Arranged: NA          Prior Living Arrangements/Services Living arrangements for the past 2 months: Single Family Home Lives with:: Self Patient language and need for interpreter reviewed:: Yes Do you feel safe going back to the place where you live?: Yes      Need for Family Participation in Patient Care: Yes (Comment) Care giver support system in place?: Yes (comment) Current home services: DME (walker, cane,) Criminal Activity/Legal Involvement Pertinent to Current Situation/Hospitalization: No - Comment as needed  Activities of Daily Living Home Assistive Devices/Equipment: Eyeglasses (Reading glasses) ADL Screening (condition at time of admission) Patient's cognitive ability adequate to safely complete daily activities?: Yes Is the patient deaf or have difficulty hearing?: No Does the  patient have difficulty seeing, even when wearing glasses/contacts?: No Does the patient have difficulty concentrating, remembering, or making decisions?: No Patient able to express need for assistance with ADLs?: Yes Does the patient have difficulty dressing or bathing?: No Independently performs ADLs?: Yes (appropriate for developmental age) Does the patient have difficulty walking or climbing stairs?: No Weakness of Legs: None Weakness of Arms/Hands: None  Permission Sought/Granted Permission sought to share information with : Case Manager Permission granted to share information with : Yes, Verbal Permission Granted              Emotional Assessment Appearance:: Appears stated age Attitude/Demeanor/Rapport: Engaged Affect (typically observed): Appropriate Orientation: : Oriented to Self, Oriented to Place, Oriented to  Time, Oriented to Situation Alcohol / Substance Use: Not Applicable Psych Involvement: No (comment)  Admission diagnosis:  Pyelonephritis [N12] Sepsis secondary to UTI (HCC) [A41.9, N39.0] Sepsis with acute liver failure and septic shock without hepatic coma, due to unspecified organism (HCC) [A41.9, R65.21, K72.00] Sepsis (HCC) [A41.9] Patient Active Problem List   Diagnosis Date Noted   Hypoxia 12/04/2022   Sepsis secondary to UTI (HCC) 12/03/2022   Abdominal pain 12/03/2022   Lower extremity edema 12/03/2022   Elevated blood pressure reading 02/21/2021   Actinic keratoses 02/17/2018   Hyperlipidemia 01/24/2018   Shortness of breath 08/05/2014   Diastolic CHF (HCC) 06/14/2013   Crohn's disease (HCC) 06/10/2013   ESOPHAGEAL STRICTURE 11/27/2007   PCP:  Para March, DO Pharmacy:   [phone] [place], [place] - [address] [place] [address] Phone: [phone] Fax: [phone]  [facility] [address] [place] [address] Phone:  [phone] Fax: [phone]     Social Determinants of Health (SDOH) Social History: SDOH Screenings   Food Insecurity: No Food Insecurity (12/03/2022)  Housing: Low Risk  (12/03/2022)  Transportation Needs: No Transportation Needs (12/03/2022)  Utilities: Not At Risk (12/03/2022)  Depression (PHQ2-9): Low Risk  (06/05/2022)  Tobacco Use: Low Risk  (12/03/2022)   SDOH Interventions:     Readmission Risk Interventions     No data to display

## 2022-12-04 NOTE — Assessment & Plan Note (Addendum)
Since transferring, patient has intermittent episodes of tachypnea (RR 28) with transient desats (93%). Mobility specialist noted that patient desat to 86% but improved with pursed lip breathing. Pt does not have history of COPD, but  - Added on incentive spirometry - Pulm toilet - O2 therapy, maintain SpO2>91%

## 2022-12-04 NOTE — Consult Note (Addendum)
Cardiology Consult    Patient ID: DELOIS GRAVATT MRN: [id number], DOB/AGE: 78/15/1947   Admit date: 12/03/2022 Date of Consult: 12/04/2022  Primary Physician: Para March, DO Primary Cardiologist: None Requesting Provider: A. Jena Gauss, MD  Patient Profile    Diane Freeman is a 77 y.o. female with a history of w/ a h/o nl coronary arteries by cath in 2015, traumatic subdural hematoma (2015), Crohn's dzs, GIB, diverticulosis, gastritis, GERD, HL, white coat HTN, CKD III, and palpitations, who is being seen today for the evaluation of Afib w/ RVR in the setting of admission for sepsis and UTI at the request of Dr. Jena Gauss.  Past Medical History   Past Medical History:  Diagnosis Date   Adrenal nodule (HCC)    Anemia    Blood transfusion without reported diagnosis    Cataract    bil removed   Choledocholithiasis with obstruction    Colitis    Crohn's colitis (HCC) 07/11/2012   Diastolic dysfunction    a. 05/2013 Echo: EF 60-65%, no rwma, GrI DD, mild-mod TR.   Diverticulosis    Fatty liver    GERD (gastroesophageal reflux disease)    Hiatal hernia    History of esophageal stricture    History of left heart catheterization    a. 05/2013 Cath: Nl cors. Nl R heart filling pressures.  No L -> R shunt.  EF 65%.   Hx of gastritis    Palpitations    a. 09/2014 Zio: Rare PACs/PVCs.   Pyelonephritis 05/29/2022   Right shoulder pain 12/03/2022   Sepsis (HCC) 12/03/2022   UTI (urinary tract infection) 05/14/2013    Past Surgical History:  Procedure Laterality Date   CARDIAC CATHETERIZATION  05/2013   CATARACT EXTRACTION Bilateral    CHOLECYSTECTOMY  2009   COLONOSCOPY  last 09/03/2017, 2020   ERCP  06/02/2011   Procedure: ENDOSCOPIC RETROGRADE CHOLANGIOPANCREATOGRAPHY (ERCP);  Surgeon: Iva Boop, MD;  Location: [facility];  Service: Endoscopy;  Laterality: N/A;   LEFT AND RIGHT HEART CATHETERIZATION WITH CORONARY ANGIOGRAM N/A 06/15/2013   Procedure: LEFT AND RIGHT HEART  CATHETERIZATION WITH CORONARY ANGIOGRAM- Shunt series;  Surgeon: Micheline Chapman, MD;  Location: [facility] CATH LAB;  Service: Cardiovascular;  Laterality: N/A;   TUBAL LIGATION     UPPER GASTROINTESTINAL ENDOSCOPY       Allergies  No Known Allergies  History of Present Illness    77 y.o. female with a history of w/ a h/o nl coronary arteries by cath in 2015, traumatic subdural hematoma (2015), Crohn's dzs, GIB, diverticulosis, gastritis, GERD, HL, white coat HTN, CKD III, and palpitations.  She previously required cardiac eval in 05/2013, when she was admitted w/ n, v, d, and complained of chest pain.  She r/o but there was ? Of RV mass on CT chest and question of PFO/ASD on echo.  She underwent R & L heart cath, which showed nl cors, nl R heart pressures, and no step-up in O2 sats, indicating no evidence of L  R shunts.  Later the same month, she was readmitted following a mechanical fall w/ development of a small subdural hematoma, that was conservatively managed. She subsequently saw Dr. Kirke Corin in 2016 in the setting of palpitations, and monitoring was unremarkable.  In 05/2022, she was admitted w/ UTI, n, v, and was treated for pyelonephritis.  There was concern for possible Afib, though ECGs were reviewed by our team and felt to represent sinus w/ 1st deg AVB and PACs.  She  was conservatively managed.   Over the past week, she has noted increasing polyuria and abd discomfort.  Due to persistent symptoms, she presented to the ED on 9/2.  On arrival, she was afebrile and hemodynamically stable.  ECG w/ sinus vs ectopic rhythm @ 73, baseline artifact.  Labs notable for stable, mild creat elevation @ 1.05, hypoK+ @ 3.4, nl hsTrops (17  14), stable microcytic anemia (11.0/34.6), and elevated LFTs (alk phos 134, AST 248, ALT 123), lipase 61, lactic acdi 4.7  3.1.  UA notable for mod LE and nitrate + w/ many bacteria.  CTA chest/abd/pelvis w/o acute or inflammatory process.  Moderate to large hiatal hernia noted  along w/ emphysema, borderline hepatosplenomegaly, and large and small bowel diverticula.  She was admitted and placed on abx for sepsis/UTI.  In the setting of L ankle/pedal swelling, LE u/s was performed and neg for DVT.  This afternoon, at ~ 14:15, she developed tachycardia  Afib w/ RVR w/ rates in the 1-teens.  She received two doses of 2.5 mg of IV metoprolol w/ improvement in rates into the 90's to low 100's, but also w/ documented pauses of up to 7.56 secs.  Though she denies palpitations, chest pain, or dyspnea, she did have presyncope earlier this evening.  Currently she is symptom free.  hsTrops have elevated mildly @ 18  19.  Inpatient Medications     [START ON 12/05/2022] cefadroxil  1,000 mg Oral BID   enoxaparin (LOVENOX) injection  40 mg Subcutaneous Q24H   mesalamine  1,500 mg Oral Daily   metoprolol tartrate  12.5 mg Oral BID   pantoprazole  40 mg Oral Daily    Family History    Family History  Problem Relation Age of Onset   Bone cancer Maternal Grandmother    Colon cancer Maternal Aunt 60   Stomach cancer Sister 74       decsd   Breast cancer Sister    Esophageal cancer Neg Hx    Rectal cancer Neg Hx    Pancreatic cancer Neg Hx    Colon polyps Neg Hx    She indicated that her mother is deceased. She indicated that her father is deceased. She indicated that only one of her two sisters is alive. She indicated that the status of her maternal grandmother is unknown. She indicated that her maternal aunt is deceased. She indicated that the status of her neg hx is unknown.   Social History    Social History   Socioeconomic History   Marital status: Divorced    Spouse name: Not on file   Number of children: 2   Years of education: Not on file   Highest education level: Not on file  Occupational History   Occupation: retired  Tobacco Use   Smoking status: Never   Smokeless tobacco: Never  Vaping Use   Vaping status: Never Used  Substance and Sexual Activity    Alcohol use: No    Alcohol/week: 0.0 standard drinks of alcohol   Drug use: No   Sexual activity: Not on file  Other Topics Concern   Not on file  Social History Narrative   Not on file   Social Determinants of Health   Financial Resource Strain: Not on file  Food Insecurity: No Food Insecurity (12/03/2022)   Hunger Vital Sign    Worried About Running Out of Food in the Last Year: Never true    Ran Out of Food in the Last Year: Never true  Transportation Needs: No Transportation Needs (12/03/2022)   PRAPARE - Administrator, Civil Service (Medical): No    Lack of Transportation (Non-Medical): No  Physical Activity: Not on file  Stress: Not on file  Social Connections: Not on file  Intimate Partner Violence: Not At Risk (12/03/2022)   Humiliation, Afraid, Rape, and Kick questionnaire    Fear of Current or Ex-Partner: No    Emotionally Abused: No    Physically Abused: No    Sexually Abused: No     Review of Systems    General:  +++ generalized malaise over the past week.  No chills, fever, night sweats or weight changes.  Cardiovascular:  +++ presyncope this evening while lying in bed.  Occas, sharp/fleeting chest pain, about once/wk x many years.  No exertional chest pain, dyspnea on exertion, edema, orthopnea, palpitations, paroxysmal nocturnal dyspnea. Dermatological: No rash, lesions/masses Respiratory: No cough, dyspnea Urologic: +++ polyuria prior to admission.  No hematuria, dysuria Abdominal:   +++ nausea and vomiting over the past 2 days.  +++ 1 wk h/o abdominal cramping and discomfort.  No diarrhea, bright red blood per rectum, melena, or hematemesis Neurologic:  No visual changes, wkns, changes in mental status. All other systems reviewed and are otherwise negative except as noted above.  Physical Exam    Blood pressure (!) 143/82, pulse (!) 119, temperature 98.4 F (36.9 C), temperature source Oral, resp. rate 18, height 5\' 3"  (1.6 m), weight 68.9 kg, SpO2  93%.  General: Pleasant, NAD Psych: Normal affect. Neuro: Alert and oriented X 3. Moves all extremities spontaneously. HEENT: Normal  Neck: Supple without bruits or JVD. Lungs:  Resp regular and unlabored, bibasilar crackles. Heart: IR, IR, tachy, no s3, s4, or murmurs. Abdomen: Soft, non-tender, non-distended, BS + x 4.  Extremities: No clubbing, cyanosis.  Trace bilat ankle edema. DP/PT2+, Radials 2+ and equal bilaterally.  Labs    Cardiac Enzymes Recent Labs  Lab 12/03/22 1406 12/03/22 1601 12/04/22 1605 12/04/22 1736  TROPONINIHS 17 14 18* 19*     BNP    Component Value Date/Time   BNP 59.0 05/29/2022 1820    ProBNP    Component Value Date/Time   PROBNP 94.6 06/12/2013 2259    Lab Results  Component Value Date   WBC 7.9 12/04/2022   HGB 8.9 (L) 12/04/2022   HCT 27.8 (L) 12/04/2022   MCV 77.9 (L) 12/04/2022   PLT 118 (L) 12/04/2022    Recent Labs  Lab 12/04/22 0224 12/04/22 0836  NA 133*  --   K 3.8  --   CL 105  --   CO2 15*  --   BUN 15  --   CREATININE 1.14*  --   CALCIUM 8.0*  --   PROT 6.0*  --   BILITOT 2.7* 2.6*  ALKPHOS 102  --   ALT 86*  --   AST 92*  --   GLUCOSE 96  --    Lab Results  Component Value Date   CHOL 205 (H) 02/17/2021   HDL 31 (L) 02/17/2021   LDLCALC 118 (H) 02/17/2021   TRIG 316 (H) 02/17/2021     Radiology Studies    VAS [place] LOWER EXTREMITY VENOUS (DVT)  Result Date: 12/04/2022  Lower Venous DVT Study Patient Name:  DENIS SKALKA  Date of Exam:   12/04/2022 Medical Rec #: [id number]          Accession #:    [id number] Date of Birth: April 08, 1945  Patient Gender: F Patient Age:   21 years Exam Location:  [facility] Procedure:      VAS [place] LOWER EXTREMITY VENOUS (DVT) Referring Phys: Janit Pagan --------------------------------------------------------------------------------  Indications: Pain.  Risk Factors: Obesity and past pregnancy. Comparison Study: No prior study Performing Technologist: Shona Simpson  Examination Guidelines: A complete evaluation includes B-mode imaging, spectral Doppler, color Doppler, and power Doppler as needed of all accessible portions of each vessel. Bilateral testing is considered an integral part of a complete examination. Limited examinations for reoccurring indications may be performed as noted. The reflux portion of the exam is performed with the patient in reverse Trendelenburg.   Summary: RIGHT: - No evidence of common femoral vein obstruction.   LEFT: - There is no evidence of deep vein thrombosis in the lower extremity.  - No cystic structure found in the popliteal fossa.  *See table(s) above for measurements and observations.    Preliminary    CT Angio Chest/Abd/Pel for Dissection W and/or Wo Contrast  Result Date: 12/03/2022 CLINICAL DATA:  77 year old female with abdominal pain since 2300 hours. EXAM: CT ANGIOGRAPHY CHEST, ABDOMEN AND PELVIS TECHNIQUE: Non-contrast CT of the chest was initially obtained. Multidetector CT imaging through the chest, abdomen and pelvis was performed using the standard protocol during bolus administration of intravenous contrast. Multiplanar reconstructed images and MIPs were obtained and reviewed to evaluate the vascular anatomy. RADIATION DOSE REDUCTION: This exam was performed according to the departmental dose-optimization program which includes automated exposure control, adjustment of the mA and/or kV according to patient size and/or use of iterative reconstruction technique. CONTRAST:  OMNIPAQUE IOHEXOL 350 MG/ML SOLN COMPARISON:  Calcified aortic atherosclerosis. Calcified coronary artery atherosclerosis (series 4, image 36). No cardiomegaly. And following contrast the thoracic aorta is negative for dissection or aneurysm. There is mild aortic tortuosity. Central pulmonary arteries are also enhancing and appear to be patent. FINDINGS: CTA CHEST FINDINGS Cardiovascular: Moderate to large gastric hiatal hernia, chronic but  increased since 2015. Mediastinum is otherwise negative for mass or lymphadenopathy. Mediastinum/Nodes: Lower lung volumes compared to 2015 with bilateral dependent atelectasis. Major airways remain patent. Additional atelectasis associated with the hiatal hernia. No pleural effusion or consolidation. In the upper lungs coarsening of the interstitial pattern suggests centrilobular emphysema now (series 7, image 40). No areas suspicious for pulmonary inflammation. Lungs/Pleura: Osteopenia. Maintained thoracic vertebral height. No acute osseous abnormality identified. Coarse and benign appearing calcification of the medial right breast on series 7, image 73. Musculoskeletal: Review of the MIP images confirms the above findings. CTA ABDOMEN AND PELVIS FINDINGS VASCULAR Aortoiliac calcified atherosclerosis. Mildly tortuous abdominal aorta but negative for abdominal aortic aneurysm. No dissection. Major arterial structures in the abdomen remain patent. Bilateral major iliac arteries and visible proximal femoral arteries are patent. Review of the MIP images confirms the above findings. NON-VASCULAR Hepatobiliary: Chronic cholecystectomy. Stable liver, borderline to mild hepatomegaly which is nonspecific. Pancreas: Partial fatty atrophy of the pancreas. No definite acute finding. Spleen: Borderline splenomegaly. Estimated splenic volume now is 400 mL (normal splenic volume range 83 - 412 mL). Spleen size is larger since 2015 but not significantly changed from February. No discrete splenic lesion. Adrenals/Urinary Tract: Negative adrenal glands. Stable kidneys, chronic right renal midpole parapelvic cyst has been present since at least 2015. No evidence of obstructive uropathy or renal inflammation. Stomach/Bowel: Decompressed rectum. Diverticulosis of the sigmoid colon. No active inflammation is evident. Decompressed descending colon. Minimal large bowel diverticula upstream of the sigmoid. Mildly featureless appearance of  the upstream large bowel, transverse and right colon as seen on series 5, image 176. But no mesenteric inflammation. Retained stool in those segments. Cecum is chronically on a lax mesentery. Appendix remains diminutive. Some flocculated material in the terminal ileum which otherwise appears negative. Nondilated small bowel. Decompressed intra-abdominal portion of the stomach. No free air or free fluid identified. Distal duodenal diverticulum is small and stable with no active inflammation on series 5, image 168. Lymphatic: No lymphadenopathy. Reproductive: Negative. Other: No pelvis free fluid. Musculoskeletal: Osteopenia. Maintained lumbar vertebral height. Chronic disc degeneration with vacuum disc. No acute osseous abnormality identified. Review of the MIP images confirms the above findings. IMPRESSION: 1. Aortic Atherosclerosis (ICD10-I70.0). Negative for aortic aneurysm or dissection. 2. No definite acute or inflammatory process identified in the chest, abdomen, or pelvis: - moderate to large gastric hiatal hernia, chronic but increased since 2015. - suspect Emphysema (ICD10-J43.9) has developed since 2015. - borderline Hepatosplenomegaly , nonspecific. - occasional large and small bowel diverticula. No convincing bowel inflammation. Electronically Signed   By: Odessa Fleming M.D.   On: 12/03/2022 04:45    ECG & Cardiac Imaging     9/2 @ 0238: sinus vs ectopic rhythm @ 73, baseline artifact - personally reviewed 9/3 @ 1604:  Afib, 114, no acute ST/T changes - personally reviewed.  Assessment & Plan    1.  Afib w/ RVR:  Pt admitted w/ 1 wk h/o abd discomfort and polyuria, and several day h/o nausea and vomiting.  Dx w/ sepsis/UTI and placed on abx.  Presenting ECG notable for short PR, likely ectopic atrial rhythm @ 73 bpm (prior h/o 1st deb AVB by ECG in 05/2022).  She developed Afib w/ rates in the 1-teens earlier this afternoon.  She received 2 doses of IV metoprolol 2.5 mg w/in a few mins of each other.   Rates improved into the 90's, but she has also had pauses of up to 7.56 sec pauses earlier this evening, likely assoc w/ presyncope, as she felt like she might pass out at one point, but not documented that this was assoc w/ pauses.  Currently asymptomatic.  Mg 1.6 w/ supplementation ordered.  K wnl.  TSH pending.  F/u echo.  CHA2DS2VASc = 3 - 4 (age, ?, cor Ca2+ on CT. White coat HTN listed but not on antihypertensives @ home).  Will check INR w/ lft abnormalities.  Provided that this is nl, will add heparin and d/c lovenox (note prior h/o small SDH following fall in 2015 w/ nl head CT in 07/2019).  Concern for tachy-brady.  As rate is reasonable and she is currently asymptomatic, will defer any additional AVN blocking agents.  Will ask EP to see in AM.  F/u echo.  2.  Sepsis/UTI:  1 wk h/o abd discomfort and polyuria w/ 2 day h/o n/v.  UA suggestive of UTI.  Hemodynamically stable.  Abx per medicine team.  3.  Demand Ischemia:  Initial hsTrops nl but in setting of Afib w/ RVR, hsTrops mildly elevated @ 18  19.  Long h/o sharp and fleeting c/p @ rest, but no h/o exertional angina/dyspnea, or limitations in activities @ home.  Nl cors on cath in 2015.  F/u echo.  4.  Microcytic anemia:  H/H drifting down slightly since admission in setting of blood draws and IVF.  No known bleeding.  Follow w/ initiation of anticoagulation.  5.  CKD III:  stable.  6.  Transaminitis:  Improving.  Felt to be 2/2 sepsis.  F/u INR given need for anticoagulation.  Per IM.  Risk Assessment/Risk Scores:          CHA2DS2-VASc Score = 4   This indicates a 4.8% annual risk of stroke. The patient's score is based upon: CHF History: 0 HTN History: 0 Diabetes History: 0 Stroke History: 0 Vascular Disease History: 1 Age Score: 2 Gender Score: 1     Signed, Nicolasa Ducking, NP 12/04/2022, 8:12 PM  For questions or updates, please contact   Please consult [email or web address] for contact info under  Cardiology/STEMI.  History and all data above reviewed.  Patient examined.  I agree with the findings as above.  The patient presents with polyuria, fevers and chills.  She is being managed for sepsis and a probable urinary tract infection.  She developed atrial fibrillation and is noted above and had a significant pause after IV beta-blocker.  She did feel little presyncopal around this time.  She has not really feel the tachypalpitations but she felt lightheaded presumably at the time of the 7.6-second pause.  At home she feels some occasional palpitations but these are particular problematic.  She does not have presyncope or syncope.  She lives alone.  Nephew lives down the road.  She has siblings including a sister who helps her but she is able to drive and do her own chores.  She does her own shopping.  She says she is felt well and occasionally gets some sharp discomfort under her left breast but does not have substernal discomfort.  She does not have neck discomfort or arm discomfort.  She does not have shortness of breath, PND or orthopnea.  The patient exam reveals COR: Irregular, no rubs,  Lungs: Decreased breath sounds in the right greater than left base,  Abd: Positive bowel sounds no rebound or guarding, Ext 2+ pulses, no edema..  All available labs, radiology testing, previous records reviewed. Agree with documented assessment and plan.  Atrial fibrillation: The patient has a rapid rate likely owing to her sepsis.  Rate control course will be difficult given the pauses she had with the IV beta-blocker.  We are can start her on heparin.  If she does not convert spontaneously she likely will need DC cardioversion.  I would treat her with DOAC at home at least for the short-term although she has a complicating history of a fall with subdural hematoma.   Follow-up echocardiogram was suggested.  Fayrene Fearing Faren Florence  8:22 PM  12/04/2022

## 2022-12-04 NOTE — Progress Notes (Signed)
ANTICOAGULATION CONSULT NOTE - Initial Consult  Pharmacy Consult for heparin Indication: atrial fibrillation  No Known Allergies  Patient Measurements: Height: 5\' 3"  (160 cm) Weight: 68.9 kg (151 lb 14.4 oz) IBW/kg (Calculated) : 52.4 Heparin Dosing Weight: 66.5 kg   Vital Signs: Temp: 98.4 F (36.9 C) (09/03 1945) Temp Source: Oral (09/03 1945) BP: 143/82 (09/03 1945) Pulse Rate: 119 (09/03 1945)  Labs: Recent Labs    12/03/22 0251 12/03/22 1406 12/03/22 1601 12/04/22 0224 12/04/22 0836 12/04/22 1605 12/04/22 1736  HGB 11.0*  --   --  9.2* 8.9*  --   --   HCT 34.6*  --   --  31.3* 27.8*  --   --   PLT 152  --   --  103* 118*  --   --   CREATININE 1.05*  --   --  1.14*  --   --   --   TROPONINIHS  --    < > 14  --   --  18* 19*   < > = values in this interval not displayed.    Estimated Creatinine Clearance: 37.9 mL/min (A) (by C-G formula based on SCr of 1.14 mg/dL (H)).   Medical History: Past Medical History:  Diagnosis Date   Adrenal nodule (HCC)    Anemia    Blood transfusion without reported diagnosis    Cataract    bil removed   Choledocholithiasis with obstruction    Colitis    Crohn's colitis (HCC) 07/11/2012   Diastolic dysfunction    a. 05/2013 Echo: EF 60-65%, no rwma, GrI DD, mild-mod TR.   Diverticulosis    Fatty liver    GERD (gastroesophageal reflux disease)    Hiatal hernia    History of esophageal stricture    History of left heart catheterization    a. 05/2013 Cath: Nl cors. Nl R heart filling pressures.  No L -> R shunt.  EF 65%.   Hx of gastritis    Palpitations    a. 09/2014 Zio: Rare PACs/PVCs.   Pyelonephritis 05/29/2022   Right shoulder pain 12/03/2022   Sepsis (HCC) 12/03/2022   UTI (urinary tract infection) 05/14/2013    Medications:  Scheduled:   [START ON 12/05/2022] cefadroxil  1,000 mg Oral BID   mesalamine  1,500 mg Oral Daily   metoprolol tartrate  12.5 mg Oral BID   pantoprazole  40 mg Oral Daily     Assessment: 28 yof presenting with sepsis from UTI and now evaluated with Afib w/ RVR. No AC PTA. Has hx GIB and traumatic SDH.   Hgb 8.9, plt 119. No s/sx of bleeding. Received enoxaparin 40 mg on 9/3@1532 .  Goal of Therapy:  Heparin level 0.3-0.7 units/ml Monitor platelets by anticoagulation protocol: Yes   Plan:  Give 2000 units bolus x 1 Start heparin infusion at 950 units/hr Check anti-Xa level in 8 hours and daily while on heparin Continue to monitor H&H and platelets  Thank you for allowing pharmacy to participate in this patient's care,  Sherron Monday, PharmD, BCCCP Clinical Pharmacist  Phone: [phone] 12/04/2022 8:23 PM  Please check AMION for all [facility] phone numbers After 10:00 PM, call Main Pharmacy [phone]

## 2022-12-04 NOTE — Progress Notes (Signed)
Notified by patient's RN that she went into A-fib with a heart rate around 120.  Went to evaluate patient at bedside.  Patient states that she is asymptomatic.  She says she can typically tell when she goes into A-fib, and she does not take anything at home for rate control.  She does note that prior to going into A-fib, she experienced indigestion in the middle of her chest after eating a banana.  Patient was given Maalox with some improvement.  Will go ahead and order EKG and troponins at this time.  Will give 2.5mg  of metoprolol for rate control at this time.

## 2022-12-04 NOTE — Progress Notes (Addendum)
FMTS Interim Progress Note  S: Went to see patient at bedside with Dr. Yetta Barre.  Patient is doing well and has no concerns currently.  She denies any shortness of breath, chest pain, or palpitations.  O: BP (!) 143/82 (BP Location: Right Arm)   Pulse (!) 119   Temp 98.4 F (36.9 C) (Oral)   Resp 18   Ht 5\' 3"  (1.6 m)   Wt 68.9 kg   SpO2 93%   BMI 26.91 kg/m   General: Awake and alert, NAD Cardiovascular: RRR.  No M/R/G Respiratory: CTAB.  No crackles, rhonchi, or wheezing noted Extremities: BLE edema  A/P: A-fib Patient reports that she has a history of A-fib and can normally tell when she is going into A-fib, but not on anticoagulation or rate control.  No history of A-fib seen in her chart.  During the day patient had some pauses on her telemetry, unsure of what the association is with whether it is from IV metoprolol or presyncopal episode.  Cardiology was consulted and recommended switching to heparin from Lovenox for anticoagulation.  Patient received IV metoprolol 2.5 mg today x 2 along with metoprolol tartrate 12.5 mg earlier this evening to moderate her heart rate which was elevated to the 120s.  During her exam this evening, patient heart rate fluctuated from low 100s to 1-teens with 1 HR above 120 but resolved quickly. - Heparin gtt per cards - Discontinued metoprolol tartrate 12.5 mg, rate control is difficult given the pauses she had with IV beta-blocker - If patient does not convert spontaneously, likely will need DC cardioversion - Consider DOAC for short-term course, but must be cautious due to complicated history of fall with subdural hematoma - Discussed with the patient the plan per cardiology and she understood - Will continue to monitor patient overnight and RN to notify if heart rate is sustained over the 120s  Urosepsis Patient is on cefadroxil 1000 mg BID for 3 days (9/3-9/5). - Continue antibiotic regimen - Blood culture pending  Shortness of breath Patient  denies too much distress at this time.  Weaned her oxygen down to 1 L [place] and saturating in the low 90s. - Continue incentive spirometry - Maintain O2 saturation greater than 91%  Rest of plan per day team note.  Fortunato Curling, DO 12/04/2022, 10:31 PM PGY-1, [facility] Service pager [phone]

## 2022-12-04 NOTE — Plan of Care (Signed)

## 2022-12-04 NOTE — Assessment & Plan Note (Addendum)
Pt presented with UTI (primary uti symptom - urinary frequency) and rising fevers. Pt met sepsis criteria. Since transferring, patient has intermittent episodes of tachypnea (RR 28) with transient desats (93%), low DBP (46), low grade fever (100.8, improved to 97.5). Sepsis most likely secondary to UTI at this time, will treat to cover E coli, which she has grown in previous hospitalizations.  - Blood Cx fu - Maintenance IVF NS 15ml/hr - Ceftriaxone 2g IV, two days, transitioned to cefadroxil 1000mg  bid for 3 days. Day 2/5 of abx therapy.

## 2022-12-04 NOTE — Assessment & Plan Note (Signed)
>>  ASSESSMENT AND PLAN FOR SHORTNESS OF BREATH WRITTEN ON 12/04/2022  2:06 PM BY Lanora Reveron, Byrd Hesselbach, MD  Since transferring, patient has intermittent episodes of tachypnea (RR 28) with transient desats (93%). Mobility specialist noted that patient desat to 86% but improved with pursed lip breathing. Pt does not have history of COPD, but  - Added on incentive spirometry - Pulm toilet - O2 therapy, maintain SpO2>91%

## 2022-12-04 NOTE — Assessment & Plan Note (Signed)
Positive UA, with urine culture pending. - CTX 2g/day

## 2022-12-04 NOTE — Assessment & Plan Note (Addendum)
May be lymphedema vs thrombosis. Pt appeared to have reduced edema this morning on exam.  - LLE doppler US ordered to rule out DVT

## 2022-12-04 NOTE — Assessment & Plan Note (Signed)
Abdominal pain may be secondary to UTI vs other GI pathology.  - CMP, Hepatitis Panel, Hep B surf antigen tomorrow AM - Tylenol PRN for pain - Consider oxy 5mg  prn for breakthrough pain - GPP if symptoms worsen

## 2022-12-04 NOTE — Progress Notes (Signed)
PT Cancellation Note  Patient Details Name: Diane Freeman MRN: [id number] DOB: 30-Jul-1945   Cancelled Treatment:    Reason Eval/Treat Not Completed: Fatigue/lethargy limiting ability to participate. Pt recently complete session with mobility specialist upon PT arrival. Pt declines PT evaluation due to fatigue. PT will follow up as time allows.   Arlyss Gandy 12/04/2022, 11:25 AM

## 2022-12-04 NOTE — Progress Notes (Signed)
Daily Progress Note Intern Pager: [phone]  Patient name: Diane Freeman Medical record number: [id number] Date of birth: Mar 20, 1946 Age: 77 y.o. Gender: female  Primary Care Provider: Para March, DO Consultants: None Code Status: Full  Pt Overview and Major Events to Date:  Diane Freeman is a 77 y.o. female presenting with abdominal pain and vomiting. Differential for presentation of this includes sepsis secondary to UTI vs other infectious source, pancreatitis, hepatitis, thrombophlebitis, or dehydration secondary to viral GI illness vs food poisoning. Most likely secondary to UTI given UA findings and symptoms. Possibly pancreatitis or hepatitis given elevated lipase and transaminases though not significantly elevated. Possibly thrombophlebitis given LE swelling L>R though LLE appears warm and non-erythematous. Less likely dehydration secondary to GI virus vs food poisoning given lack of other GI symptoms and absence of sick contacts.   Assessment and Plan: Assessment & Plan Sepsis secondary to UTI (HCC) Pt presented with UTI (primary uti symptom - urinary frequency) and rising fevers. Pt met sepsis criteria. Since transferring, patient has intermittent episodes of tachypnea (RR 28) with transient desats (93%), low DBP (46), low grade fever (100.8, improved to 97.5). Sepsis most likely secondary to UTI at this time, will treat to cover E coli, which she has grown in previous hospitalizations.  - Blood Cx fu - Maintenance IVF NS 194ml/hr - Ceftriaxone 2g IV, two days, transitioned to cefadroxil 1000mg  bid for 3 days. Day 2/5 of abx therapy. Shortness of breath Since transferring, patient has intermittent episodes of tachypnea (RR 28) with transient desats (93%). Mobility specialist noted that patient desat to 86% but improved with pursed lip breathing. Pt does not have history of COPD, but  - Added on incentive spirometry - Pulm toilet - O2 therapy, maintain  SpO2>91%  Pyelonephritis (Resolved: 12/04/2022)  Lower extremity edema May be lymphedema vs thrombosis. Pt appeared to have reduced edema this morning on exam.  - LLE doppler US ordered to rule out DVT UTI (urinary tract infection) (Resolved: 12/04/2022) Positive UA, with urine culture pending. - CTX 2g/day Abdominal pain Abdominal pain may be secondary to UTI vs other GI pathology.  - CMP, Hepatitis Panel, Hep B surf antigen tomorrow AM - Tylenol PRN for pain - Consider oxy 5mg  prn for breakthrough pain - GPP if symptoms worsen  Right shoulder pain (Resolved: 12/04/2022)  Leg pain, bilateral (Resolved: 12/04/2022)   Chronic and Stable Problems:  Crohns: Cont home mesalamine GERD: Cont formulary equivalent of home omeprazole   FEN/GI: Reg diet VTE Prophylaxis: Lovenox  Dispo:Pending PT recommendations  in 2-3 days. Barriers include pt fatigue and unwillingness to do PT evaluation.  Subjective:  Pt reports improvement in her sleep   Objective: Temp:  [97.5 F (36.4 C)-101.4 F (38.6 C)] 98.3 F (36.8 C) (09/03 0728) Pulse Rate:  [59-98] 96 (09/03 0728) Resp:  [17-28] 21 (09/03 0728) BP: (108-151)/(44-74) 125/47 (09/03 0728) SpO2:  [88 %-100 %] 88 % (09/03 0728) Weight:  [151 lb 14.4 oz (68.9 kg)] 151 lb 14.4 oz (68.9 kg) (09/02 1008)  Physical Exam: General: Pt sitting upright in bed, patient is alert and conversant.  Cardiovascular: Pt denied chest pain, palpitations. Respiratory: Pt has mild crackles present in RLL.  Abdomen: Pt denied abdominal pain, no TTP Extremities: Pt denied pain, swelling in extremities.  Laboratory: Most recent CBC Lab Results  Component Value Date   WBC 9.3 12/04/2022   HGB 9.2 (L) 12/04/2022   HCT 31.3 (L) 12/04/2022   MCV 82.2 12/04/2022  PLT 103 (L) 12/04/2022   Most recent BMP    Latest Ref Rng & Units 12/04/2022    2:24 AM  BMP  Glucose 70 - 99 mg/dL 96   BUN 8 - 23 mg/dL 15   Creatinine 8.75 - 1.00 mg/dL 6.43   Sodium 329 -  518 mmol/L 133   Potassium 3.5 - 5.1 mmol/L 3.8   Chloride 98 - 111 mmol/L 105   CO2 22 - 32 mmol/L 15   Calcium 8.9 - 10.3 mg/dL 8.0    Other pertinent labs  Blood cultures pending from 9/2 Patient lactic acid levels were elevated on 9/2  Imaging/Diagnostic Tests: CT angio/chest - 9/2: RADIOLOGIST IMPRESSION: 1. Aortic Atherosclerosis (ICD10-I70.0). Negative for aortic aneurysm or dissection.   2. No definite acute or inflammatory process identified in the chest, abdomen, or pelvis: - moderate to large gastric hiatal hernia, chronic but increased since 2015. - suspect Emphysema (ICD10-J43.9) has developed since 2015. - borderline Hepatosplenomegaly , nonspecific. - occasional large and small bowel diverticula. No convincing bowel inflammation. Electronically Signed   By: Odessa Fleming M.D.   Margaretmary Dys, MD 12/04/2022, 7:42 AM  PGY-1, [facility] Family Medicine FPTS Intern pager: [phone], text pages welcome Secure chat group [facility] Teaching Service

## 2022-12-04 NOTE — Progress Notes (Signed)
Mobility Specialist Progress Note:   12/04/22 1126  Mobility  Activity Ambulated with assistance in hallway  Level of Assistance Minimal assist, patient does 75% or more  Assistive Device None  Distance Ambulated (ft) 80 ft  Activity Response Tolerated well  Mobility Referral Yes  $Mobility charge 1 Mobility  Mobility Specialist Start Time (ACUTE ONLY) 1100  Mobility Specialist Stop Time (ACUTE ONLY) 1115  Mobility Specialist Time Calculation (min) (ACUTE ONLY) 15 min    Pre Mobility: 82 HR , 159/72 BP , 91% SpO2 1 L  During Mobility: 103 HR , 86%- 98% SpO2 1 L Post Mobility: 85 HR , 96% SpO2 1 L  Pt received in bed, agreeable to mobility. During ambulation pt tends to lean toward left requiring MinG for steadying. Standing rest break required d/t lightheadedness. Pt desat to 86% but quickly elevated above 90% after pursed lip breathing. Pt returned to bed asymptomatic with RN present in room.    Leory Plowman  Mobility Specialist Please contact via [employer] office at [phone]

## 2022-12-04 NOTE — Progress Notes (Signed)
Lower extremity venous duplex completed. Please see CV Procedures for preliminary results.  Shona Simpson, RVT 12/04/22 1:53 PM

## 2022-12-05 ENCOUNTER — Other Ambulatory Visit (HOSPITAL_COMMUNITY): Payer: Self-pay

## 2022-12-05 ENCOUNTER — Inpatient Hospital Stay (HOSPITAL_COMMUNITY): Payer: Medicare Other

## 2022-12-05 DIAGNOSIS — I480 Paroxysmal atrial fibrillation: Secondary | ICD-10-CM | POA: Diagnosis present

## 2022-12-05 DIAGNOSIS — I4891 Unspecified atrial fibrillation: Secondary | ICD-10-CM | POA: Diagnosis present

## 2022-12-05 DIAGNOSIS — A419 Sepsis, unspecified organism: Secondary | ICD-10-CM | POA: Diagnosis not present

## 2022-12-05 DIAGNOSIS — N390 Urinary tract infection, site not specified: Secondary | ICD-10-CM | POA: Diagnosis not present

## 2022-12-05 LAB — CBC
HCT: 30.2 % — ABNORMAL LOW (ref 36.0–46.0)
Hemoglobin: 9.5 g/dL — ABNORMAL LOW (ref 12.0–15.0)
MCH: 24.9 pg — ABNORMAL LOW (ref 26.0–34.0)
MCHC: 31.5 g/dL (ref 30.0–36.0)
MCV: 79.1 fL — ABNORMAL LOW (ref 80.0–100.0)
Platelets: 131 10*3/uL — ABNORMAL LOW (ref 150–400)
RBC: 3.82 MIL/uL — ABNORMAL LOW (ref 3.87–5.11)
RDW: 17.6 % — ABNORMAL HIGH (ref 11.5–15.5)
WBC: 7 10*3/uL (ref 4.0–10.5)
nRBC: 0 % (ref 0.0–0.2)

## 2022-12-05 LAB — COMPREHENSIVE METABOLIC PANEL
ALT: 64 U/L — ABNORMAL HIGH (ref 0–44)
AST: 56 U/L — ABNORMAL HIGH (ref 15–41)
Albumin: 2.7 g/dL — ABNORMAL LOW (ref 3.5–5.0)
Alkaline Phosphatase: 120 U/L (ref 38–126)
Anion gap: 10 (ref 5–15)
BUN: 14 mg/dL (ref 8–23)
CO2: 17 mmol/L — ABNORMAL LOW (ref 22–32)
Calcium: 8.1 mg/dL — ABNORMAL LOW (ref 8.9–10.3)
Chloride: 102 mmol/L (ref 98–111)
Creatinine, Ser: 1.01 mg/dL — ABNORMAL HIGH (ref 0.44–1.00)
GFR, Estimated: 57 mL/min — ABNORMAL LOW (ref >60)
Glucose, Bld: 182 mg/dL — ABNORMAL HIGH (ref 70–99)
Potassium: 4.4 mmol/L (ref 3.5–5.1)
Sodium: 129 mmol/L — ABNORMAL LOW (ref 135–145)
Total Bilirubin: 2.7 mg/dL — ABNORMAL HIGH (ref 0.3–1.2)
Total Protein: 6.6 g/dL (ref 6.5–8.1)

## 2022-12-05 LAB — ECHOCARDIOGRAM COMPLETE
Area-P 1/2: 5.64 cm2
Height: 63 in
S' Lateral: 3.5 cm
Weight: 2430.35 [oz_av]

## 2022-12-05 LAB — HEPARIN LEVEL (UNFRACTIONATED): Heparin Unfractionated: 0.13 [IU]/mL — ABNORMAL LOW (ref 0.30–0.70)

## 2022-12-05 LAB — HAPTOGLOBIN: Haptoglobin: 126 mg/dL (ref 42–346)

## 2022-12-05 LAB — MAGNESIUM: Magnesium: 2 mg/dL (ref 1.7–2.4)

## 2022-12-05 MED ORDER — FERROUS SULFATE 325 (65 FE) MG PO TABS
325.0000 mg | ORAL_TABLET | ORAL | Status: DC
  Administered 2022-12-05 – 2022-12-07 (×2): 325 mg via ORAL
  Filled 2022-12-05 (×2): qty 1

## 2022-12-05 MED ORDER — IPRATROPIUM-ALBUTEROL 0.5-2.5 (3) MG/3ML IN SOLN: 3.0000 mL | Freq: Four times a day (QID) | RESPIRATORY_TRACT | Status: DC | PRN

## 2022-12-05 MED ORDER — APIXABAN 5 MG PO TABS
5.0000 mg | ORAL_TABLET | Freq: Two times a day (BID) | ORAL | Status: DC
  Administered 2022-12-05 – 2022-12-07 (×5): 5 mg via ORAL
  Filled 2022-12-05 (×5): qty 1

## 2022-12-05 MED ORDER — AMIODARONE HCL 200 MG PO TABS
400.0000 mg | ORAL_TABLET | Freq: Every day | ORAL | Status: DC
  Administered 2022-12-05 – 2022-12-07 (×3): 400 mg via ORAL
  Filled 2022-12-05 (×3): qty 2

## 2022-12-05 NOTE — Assessment & Plan Note (Signed)
May be lymphedema vs thrombosis. Pt appeared to have reduced edema this morning on exam.  - LLE doppler US ordered to rule out DVT

## 2022-12-05 NOTE — TOC Benefit Eligibility Note (Signed)
Patient Product/process development scientist completed.    The patient is insured through [employer]. Patient has Medicare and is not eligible for a copay card, but may be able to apply for patient assistance, if available.    Ran test claim for Eliquis 5 mg and the current 30 day co-pay is $4.60.   This test claim was processed through [facility]- copay amounts may vary at other pharmacies due to pharmacy/plan contracts, or as the patient moves through the different stages of their insurance plan.     Roland Earl, CPHT Pharmacy Technician III Certified Patient Advocate [facility] Pharmacy Patient Advocate Team Direct Number: [phone]  Fax: [phone]

## 2022-12-05 NOTE — Assessment & Plan Note (Signed)
>>  ASSESSMENT AND PLAN FOR SHORTNESS OF BREATH WRITTEN ON 12/05/2022  8:08 AM BY Bonney Berres, Byrd Hesselbach, MD  Since transferring, patient has intermittent episodes of tachypnea (RR 28) with transient desats (93%). Mobility specialist noted that patient desat to 86% but improved with pursed lip breathing. Pt does not have history of COPD, but the team would like to start her on an inhaler to see if this improves symptoms. - Added on incentive spirometry - Pulm toilet - O2 therapy, maintain SpO2>91%

## 2022-12-05 NOTE — Progress Notes (Signed)
Mobility Specialist Progress Note:   12/05/22 1556  Oxygen Therapy  O2 Device Nasal Cannula  O2 Flow Rate (L/min) 2 L/min  Mobility  Activity Ambulated with assistance in hallway  Level of Assistance Contact guard assist, steadying assist  Assistive Device None  Distance Ambulated (ft) 200 ft  Activity Response Tolerated well  Mobility Referral Yes  $Mobility charge 1 Mobility    Pre Mobility: 91 HR,  92% SpO2 During Mobility: 113 HR,  94% SpO2 Post Mobility:  98 HR,  95% SpO2  Pt received in bed, agreeable to mobility. C/o some fatigue and SOB. VSS throughout. Pt voided in BR upon returning to room. Pt left in bed with call bell and all needs met.  Diane Freeman Mobility Specialist Please contact via Special educational needs teacher or Rehab office at [phone]

## 2022-12-05 NOTE — Plan of Care (Signed)

## 2022-12-05 NOTE — Consult Note (Addendum)
Cardiology Consultation   Patient ID: Diane Freeman MRN: [id number]; DOB: 1946-03-09  Admit date: 12/03/2022 Date of Consult: 12/05/2022  PCP:  Para March, DO   [facility] Providers Cardiologist:  Dr. Kirke Corin (2016)  Patient Profile:   Diane Freeman is a 77 y.o. female with a hx of traumatic SDH (2015), Chron's, GIB, diverticulosis, GERD, esophageal stricture, HTN, HLD, CKD (III), remote cath (2015) with no obstructive CAD, who is being seen 12/05/2022 for the evaluation of possible tachy-brady at the request of Dr. Antoine Poche.  History of Present Illness:   Diane Freeman came with abdominal discomfort, vomiting, and urinary complaints, found with low grade temp (met sepsis criteria with fever and elevated lactic acid, UTI), admitted and started on ABX, IVF LLE edema evaluted and neg for DVT UTI She developed rapid AFib treated with IV metoprolol > pause and cardiology consulted.  Reported some dizziness, +/- about the time of the pause (not described as post conversion), not overwhelming palpitations.  Started on heparin gtt with plans for [facility], and DCCV if remains in AF, felt with pause after IV BB rate control strategy may prove challenging. Planned for EP evaluation in the AM.  LABS K+ 3.4 > 3.8  BUN/Creat 13/1.05 > 1.14  Mag 1.6 AST 248 > 92 ALT 123 > 86  Lipase 61 T bili 1.1 > 2.7 HS Trop 17 > 14 Lactic acid 4.7 > 3.1 WBC 6.6 > 9.3 H/H 11/34 >> 9.2/31 Plts 152 > 103   RVR >> IV lopressor x2 >> 7.86 sec pause (AFib > AFib) AFib 100's Had a few intermittent AFib > ~ 3 second pauses to junctional beats PO lopressor 12.5 at 20:13 Post conversion pause at 20:37,  4.41 seconds > junctional/SB (30's-40's for a few beats > AFib PO stopped 23:39 > post conversion pause 9.2 seconds > remains in SR  She tells me that for about 2 days prior to coming in she was having  crampy then more persistent low abdominal pain, continued to get worse and came in. No  CP, no SOB  She denies any cardiac concerns of late or at all, every once in a while has fleeting quick beats, nothing that she was concerned about and not with any other symptoms. No CP No SOB She denies hx of dizziness/near syncope or syncope   Last night on 2 occasions while in bed had 2 spells that she felt "really funny" but passed quickly, no syncope    Past Medical History:  Diagnosis Date   Adrenal nodule (HCC)    Anemia    Blood transfusion without reported diagnosis    Cataract    bil removed   Choledocholithiasis with obstruction    Colitis    Crohn's colitis (HCC) 07/11/2012   Diastolic dysfunction    a. 05/2013 Echo: EF 60-65%, no rwma, GrI DD, mild-mod TR.   Diverticulosis    Fatty liver    GERD (gastroesophageal reflux disease)    Hiatal hernia    History of esophageal stricture    History of left heart catheterization    a. 05/2013 Cath: Nl cors. Nl R heart filling pressures.  No L -> R shunt.  EF 65%.   Hx of gastritis    Palpitations    a. 09/2014 Zio: Rare PACs/PVCs.   Pyelonephritis 05/29/2022   Right shoulder pain 12/03/2022   Sepsis (HCC) 12/03/2022   UTI (urinary tract infection) 05/14/2013    Past Surgical History:  Procedure Laterality Date  CARDIAC CATHETERIZATION  05/2013   CATARACT EXTRACTION Bilateral    CHOLECYSTECTOMY  2009   COLONOSCOPY  last 09/03/2017, 2020   ERCP  06/02/2011   Procedure: ENDOSCOPIC RETROGRADE CHOLANGIOPANCREATOGRAPHY (ERCP);  Surgeon: Iva Boop, MD;  Location: [facility];  Service: Endoscopy;  Laterality: N/A;   LEFT AND RIGHT HEART CATHETERIZATION WITH CORONARY ANGIOGRAM N/A 06/15/2013   Procedure: LEFT AND RIGHT HEART CATHETERIZATION WITH CORONARY ANGIOGRAM- Shunt series;  Surgeon: Micheline Chapman, MD;  Location: [facility] CATH LAB;  Service: Cardiovascular;  Laterality: N/A;   TUBAL LIGATION     UPPER GASTROINTESTINAL ENDOSCOPY        Inpatient Medications: Scheduled Meds:  cefadroxil  1,000 mg Oral BID    mesalamine  1,500 mg Oral Daily   pantoprazole  40 mg Oral Daily   Continuous Infusions:  heparin 950 Units/hr (12/05/22 0726)   PRN Meds: acetaminophen, alum & mag hydroxide-simeth, ondansetron (ZOFRAN) IV **OR** ondansetron  Allergies:   No Known Allergies  Social History:   Social History   Socioeconomic History   Marital status: Divorced    Spouse name: Not on file   Number of children: 2   Years of education: Not on file   Highest education level: Not on file  Occupational History   Occupation: retired  Tobacco Use   Smoking status: Never   Smokeless tobacco: Never  Vaping Use   Vaping status: Never Used  Substance and Sexual Activity   Alcohol use: No    Alcohol/week: 0.0 standard drinks of alcohol   Drug use: No   Sexual activity: Not on file  Other Topics Concern   Not on file  Social History Narrative   Not on file   Social Determinants of Health   Financial Resource Strain: Not on file  Food Insecurity: No Food Insecurity (12/03/2022)   Hunger Vital Sign    Worried About Running Out of Food in the Last Year: Never true    Ran Out of Food in the Last Year: Never true  Transportation Needs: No Transportation Needs (12/03/2022)   PRAPARE - Administrator, Civil Service (Medical): No    Lack of Transportation (Non-Medical): No  Physical Activity: Not on file  Stress: Not on file  Social Connections: Not on file  Intimate Partner Violence: Not At Risk (12/03/2022)   Humiliation, Afraid, Rape, and Kick questionnaire    Fear of Current or Ex-Partner: No    Emotionally Abused: No    Physically Abused: No    Sexually Abused: No    Family History:  Family History  Problem Relation Age of Onset   Bone cancer Maternal Grandmother    Colon cancer Maternal Aunt 60   Stomach cancer Sister 25       decsd   Breast cancer Sister    Esophageal cancer Neg Hx    Rectal cancer Neg Hx    Pancreatic cancer Neg Hx    Colon polyps Neg Hx      ROS:  Please  see the history of present illness.  All other ROS reviewed and negative.     Physical Exam/Data:   Vitals:   12/04/22 1945 12/04/22 2300 12/05/22 0310 12/05/22 0736  BP: (!) 143/82 129/72 137/77 (!) 160/73  Pulse: (!) 119 (!) 102 69 79  Resp: 18 20 17  (!) 24  Temp: 98.4 F (36.9 C) 98.7 F (37.1 C) 98.5 F (36.9 C) 98.2 F (36.8 C)  TempSrc: Oral Oral Oral Oral  SpO2: 93%  92% 94% 91%  Weight:      Height:        Intake/Output Summary (Last 24 hours) at 12/05/2022 1015 Last data filed at 12/05/2022 0737 Gross per 24 hour  Intake 1416.6 ml  Output --  Net 1416.6 ml      12/03/2022   10:08 AM 12/03/2022    2:30 AM 06/05/2022    3:18 PM  Last 3 Weights  Weight (lbs) 151 lb 14.4 oz 150 lb 149 lb  Weight (kg) 68.9 kg 68.04 kg 67.586 kg     Body mass index is 26.91 kg/m.  General:  Well nourished, well developed, in no acute distress HEENT: normal Neck: no JVD Vascular: No carotid bruits; Distal pulses 2+ bilaterally Cardiac:  RRR; no murmurs, gallops or rubs Lungs:  CTA b/l, no wheezing, rhonchi or rales  Abd: soft, nontender Ext: no edema Musculoskeletal:  No deformities Skin: warm and dry  Neuro:  no focal abnormalities noted Psych:  Normal affect   EKG:  The EKG was personally reviewed and demonstrates:    SR  73bpm AFib 114bpm  Telemetry:  Telemetry was personally reviewed and demonstrates:   As above >> SR 80's   Relevant CV Studies:  Updated echo is ordered, pending   06/13/2013 TTE Study Conclusions  - Left ventricle: The cavity size was normal. Wall thickness    was normal. Systolic function was normal. The estimated    ejection fraction was in the range of 60% to 65%. Wall    motion was normal; there were no regional wall motion    abnormalities. Doppler parameters are consistent with    abnormal left ventricular relaxation (grade 1 diastolic    dysfunction).  - Tricuspid valve: Mild-moderate regurgitation.  - Pulmonary arteries: PA peak pressure:  32mm Hg (S).  Impressions: - Apparent right ventricular mass or thrombus at apex 2x2 cm   CT >>> There did not appear to be a discrete RV mass. ? If ASD PFO  06/15/2013: R/LHC Final Conclusions:   1. Patent coronary arteries with minimal nonobstructive CAD 2. Normal intracardiac hemodynamics 3. Normal LV function 4. No evidence of left-to-right intracardiac shunt   Laboratory Data:  High Sensitivity Troponin:   Recent Labs  Lab 12/03/22 1406 12/03/22 1601 12/04/22 1605 12/04/22 1736  TROPONINIHS 17 14 18* 19*     Chemistry Recent Labs  Lab 12/03/22 0251 12/04/22 0224 12/04/22 1601  NA 134* 133*  --   K 3.4* 3.8  --   CL 102 105  --   CO2 19* 15*  --   GLUCOSE 171* 96  --   BUN 13 15  --   CREATININE 1.05* 1.14*  --   CALCIUM 8.8* 8.0*  --   MG  --   --  1.6*  GFRNONAA 54* 49*  --   ANIONGAP 13 13  --     Recent Labs  Lab 12/03/22 0251 12/04/22 0224 12/04/22 0836  PROT 6.9 6.0*  --   ALBUMIN 3.6 2.5*  --   AST 248* 92*  --   ALT 123* 86*  --   ALKPHOS 134* 102  --   BILITOT 1.1 2.7* 2.6*   Lipids No results for input(s): "CHOL", "TRIG", "HDL", "LABVLDL", "LDLCALC", "CHOLHDL" in the last 168 hours.  Hematology Recent Labs  Lab 12/03/22 0251 12/04/22 0224 12/04/22 0836  WBC 6.6 9.3 7.9  RBC 4.45 3.81* 3.57*  3.55*  HGB 11.0* 9.2* 8.9*  HCT 34.6* 31.3* 27.8*  MCV  77.8* 82.2 77.9*  MCH 24.7* 24.1* 24.9*  MCHC 31.8 29.4* 32.0  RDW 16.9* 17.5* 17.2*  PLT 152 103* 118*   Thyroid  Recent Labs  Lab 12/04/22 2110  TSH 0.553    BNPNo results for input(s): "BNP", "PROBNP" in the last 168 hours.  DDimer No results for input(s): "DDIMER" in the last 168 hours.   Radiology/Studies:  VAS [place] LOWER EXTREMITY VENOUS (DVT) Result Date: 12/04/2022  Lower Venous DVT Study Patient Name:  Diane Freeman  Date of Exam:   12/04/2022 Medical Rec #: [id number]          Accession #:    [id number] Date of Birth: 06-22-1945           Patient Gender: F Patient Age:    55 years Exam Location:  [facility] Procedure:      VAS [place] LOWER EXTREMITY VENOUS (DVT) Referring Phys: Janit Pagan --------------------------------------------------------------------------------  Indications: Pain.  Risk Factors: Obesity and past pregnancy. Comparison Study: No prior study Performing Technologist: Shona Simpson  Examination Guidelines: A complete evaluation includes B-mode imaging, spectral Doppler, color Doppler, and power Doppler as needed of all accessible portions of each vessel. Bilateral testing is considered an integral part of a complete examination. Limited examinations for reoccurring indications may be performed as noted. The reflux portion of the exam is performed with the patient in reverse Trendelenburg.  Summary: RIGHT: - No evidence of common femoral vein obstruction.   LEFT: - There is no evidence of deep vein thrombosis in the lower extremity.  - No cystic structure found in the popliteal fossa.  *See table(s) above for measurements and observations. Electronically signed by Waverly Ferrari MD on 12/04/2022 at 7:27:00 PM.    Final    CT Angio Chest/Abd/Pel for Dissection W and/or Wo Contrast Result Date: 12/03/2022 CLINICAL DATA:  77 year old female with abdominal pain since 2300 hours. EXAM: CT ANGIOGRAPHY CHEST, ABDOMEN AND PELVIS TECHNIQUE: Non-contrast CT of the chest was initially obtained. Multidetector CT imaging through the chest, abdomen and pelvis was performed using the standard protocol during bolus administration of intravenous contrast. Multiplanar reconstructed images and MIPs were obtained and reviewed to evaluate the vascular anatomy. RADIATION DOSE REDUCTION: This exam was performed according to the departmental dose-optimization program which includes automated exposure control, adjustment of the mA and/or kV according to patient size and/or use of iterative reconstruction technique. CONTRAST:  OMNIPAQUE IOHEXOL 350 MG/ML SOLN COMPARISON:   Calcified aortic atherosclerosis. Calcified coronary artery atherosclerosis (series 4, image 36). No cardiomegaly. And following contrast the thoracic aorta is negative for dissection or aneurysm. There is mild aortic tortuosity. Central pulmonary arteries are also enhancing and appear to be patent. FINDINGS: CTA CHEST FINDINGS Cardiovascular: Moderate to large gastric hiatal hernia, chronic but increased since 2015. Mediastinum is otherwise negative for mass or lymphadenopathy. Mediastinum/Nodes: Lower lung volumes compared to 2015 with bilateral dependent atelectasis. Major airways remain patent. Additional atelectasis associated with the hiatal hernia. No pleural effusion or consolidation. In the upper lungs coarsening of the interstitial pattern suggests centrilobular emphysema now (series 7, image 40). No areas suspicious for pulmonary inflammation. Lungs/Pleura: Osteopenia. Maintained thoracic vertebral height. No acute osseous abnormality identified. Coarse and benign appearing calcification of the medial right breast on series 7, image 73. Musculoskeletal: Review of the MIP images confirms the above findings. CTA ABDOMEN AND PELVIS FINDINGS VASCULAR Aortoiliac calcified atherosclerosis. Mildly tortuous abdominal aorta but negative for abdominal aortic aneurysm. No dissection. Major arterial structures in the abdomen remain patent.  Bilateral major iliac arteries and visible proximal femoral arteries are patent. Review of the MIP images confirms the above findings. NON-VASCULAR Hepatobiliary: Chronic cholecystectomy. Stable liver, borderline to mild hepatomegaly which is nonspecific. Pancreas: Partial fatty atrophy of the pancreas. No definite acute finding. Spleen: Borderline splenomegaly. Estimated splenic volume now is 400 mL (normal splenic volume range 83 - 412 mL). Spleen size is larger since 2015 but not significantly changed from February. No discrete splenic lesion. Adrenals/Urinary Tract: Negative  adrenal glands. Stable kidneys, chronic right renal midpole parapelvic cyst has been present since at least 2015. No evidence of obstructive uropathy or renal inflammation. Stomach/Bowel: Decompressed rectum. Diverticulosis of the sigmoid colon. No active inflammation is evident. Decompressed descending colon. Minimal large bowel diverticula upstream of the sigmoid. Mildly featureless appearance of the upstream large bowel, transverse and right colon as seen on series 5, image 176. But no mesenteric inflammation. Retained stool in those segments. Cecum is chronically on a lax mesentery. Appendix remains diminutive. Some flocculated material in the terminal ileum which otherwise appears negative. Nondilated small bowel. Decompressed intra-abdominal portion of the stomach. No free air or free fluid identified. Distal duodenal diverticulum is small and stable with no active inflammation on series 5, image 168. Lymphatic: No lymphadenopathy. Reproductive: Negative. Other: No pelvis free fluid. Musculoskeletal: Osteopenia. Maintained lumbar vertebral height. Chronic disc degeneration with vacuum disc. No acute osseous abnormality identified. Review of the MIP images confirms the above findings. IMPRESSION: 1. Aortic Atherosclerosis (ICD10-I70.0). Negative for aortic aneurysm or dissection. 2. No definite acute or inflammatory process identified in the chest, abdomen, or pelvis: - moderate to large gastric hiatal hernia, chronic but increased since 2015. - suspect Emphysema (ICD10-J43.9) has developed since 2015. - borderline Hepatosplenomegaly , nonspecific. - occasional large and small bowel diverticula. No convincing bowel inflammation. Electronically Signed   By: Odessa Fleming M.D.   On: 12/03/2022 04:45     Assessment and Plan:   New onset AFib (RVR) Pauses (as decribed above) CHA2DS2Vasc is 4 >> heparin gtt In setting of UTI/sepsis, 101 temp (on arrival) No hx of near syncope or syncope, no dizy spells (until  last night)  No PPM today in setting of sepsis, UTI and febrile illness Laporsche Hoeger need to discuss with EP MD not entirely convinced she needs pacing or specific AFib management at this juncture   EP MD Maritta Kief see once out of procedure    Risk Assessment/Risk Scores:    For questions or updates, please contact [facility] Please consult [email or web address] for contact info under    Signed, [name], PA-C  12/05/2022 10:15 AM  I have seen and examined this patient with Francis Dowse.  Agree with above, note added to reflect my findings.  She has a complex past medical history as above.  She presented to the emergency room with abdominal discomfort, vomiting, urinary complaints, was found to have a UTI and met sepsis criteria.  Since being in the hospital, she has developed rapid atrial fibrillation.  She was given IV metoprolol which resulted in pauses in her rhythm.  Pauses appear to be both postconversion pauses and pauses during atrial fibrillation.  She is felt dizzy, but has not had any syncope.  She feels that she is improving from her sepsis.  GEN: Well nourished, well developed, in no acute distress  HEENT: normal  Neck: no JVD, carotid bruits, or masses Cardiac: RRR; no murmurs, rubs, or gallops,no edema  Respiratory:  clear to auscultation bilaterally, normal work of breathing  GI: soft, nontender, nondistended, + BS MS: no deformity or atrophy  Skin: warm and dry Neuro:  Strength and sensation are intact Psych: euthymic mood, full affect   New onset atrial fibrillation: Has significant pauses.  Currently on a heparin drip.  Has had palpitations for many years but no diagnosis of atrial fibrillation.  Atrial fibrillation likely the result of her sepsis.  As sepsis improves, atrial fibrillation burden should also improve.  Would start Eliquis once it is determined that she Chealsey Miyamoto not need procedures.  Fumiko Cham start her on amiodarone 400 mg daily.  She Rashunda Passon need this for 1 month  followed by 200 mg daily.  She can follow-up in atrial fibrillation clinic in 3 months to possibly stop amiodarone at that time.  Kalani Sthilaire M. Micaiah Litle MD 12/05/2022 2:10 PM

## 2022-12-05 NOTE — Evaluation (Signed)
Occupational Therapy Evaluation Patient Details Name: Diane Freeman MRN: [id number] DOB: 05/12/1945 Today's Date: 12/05/2022   History of Present Illness Diane Freeman is a 77 y.o. female presenting with abdominal pain and vomiting. Pt found to be septic from UTI and in afib w/ RVR. PMH: a-fib, SDH, Brohn's dzs,  GERD, white coat HTN, CKD III   Clinical Impression   Pt reports ind at baseline with ADLs/functional mobility, lives with family who can assist at d/c. Pt currently needing CGA-minA for ADLs, and CGA for transfers/in room mobility without AD. Pt with mild impulsivity/decr safety awareness attempting to stand/mobilize prior to  lines being managed. SpO2 with poor wave pleth on RA reading in the 80's, incr to 90's with 1L O2 donned and O2 sensor on finger replaced. Pt presenting with impairments listed below, will follow acutely. Recommend HHOT at d/c.        If plan is discharge home, recommend the following: A little help with walking and/or transfers;A little help with bathing/dressing/bathroom;Assistance with cooking/housework;Assist for transportation    Functional Status Assessment  Patient has had a recent decline in their functional status and demonstrates the ability to make significant improvements in function in a reasonable and predictable amount of time.  Equipment Recommendations  Tub/shower seat    Recommendations for Other Services PT consult     Precautions / Restrictions Precautions Precautions: Fall Precaution Comments: SOB Restrictions Weight Bearing Restrictions: No      Mobility Bed Mobility               General bed mobility comments: OOB in chair at arrival and departure    Transfers Overall transfer level: Needs assistance Equipment used: None Transfers: Sit to/from Stand Sit to Stand: Contact guard assist                  Balance Overall balance assessment: Mild deficits observed, not formally tested                                          ADL either performed or assessed with clinical judgement   ADL Overall ADL's : Needs assistance/impaired Eating/Feeding: Set up;Sitting   Grooming: Contact guard assist;Standing   Upper Body Bathing: Minimal assistance   Lower Body Bathing: Minimal assistance   Upper Body Dressing : Minimal assistance   Lower Body Dressing: Minimal assistance   Toilet Transfer: Contact guard assist   Toileting- Clothing Manipulation and Hygiene: Contact guard assist       Functional mobility during ADLs: Minimal assistance       Vision Baseline Vision/History: 1 Wears glasses Vision Assessment?: Wears glasses for reading Additional Comments: reports being "farsighted" and cannot see up close     Perception Perception: Not tested       Praxis Praxis: Not tested       Pertinent Vitals/Pain Pain Assessment Pain Assessment: No/denies pain     Extremity/Trunk Assessment Upper Extremity Assessment Upper Extremity Assessment: Generalized weakness   Lower Extremity Assessment Lower Extremity Assessment: Defer to PT evaluation   Cervical / Trunk Assessment Cervical / Trunk Assessment: Normal   Communication Communication Communication: No apparent difficulties   Cognition Arousal: Alert Behavior During Therapy: WFL for tasks assessed/performed Overall Cognitive Status: Within Functional Limits for tasks assessed  General Comments: decr safety awareness, pt attempting to mobilize prior to lines being managed     General Comments  SpO2 down to 80's on RA, returned to 1L O2 and SpO2 satting mid 90s    Exercises     Shoulder Instructions      Home Living Family/patient expects to be discharged to:: Private residence Living Arrangements: Alone Available Help at Discharge: Family;Available 24 hours/day (son, nephew, sister) Type of Home: Mobile home Home Access: Stairs to enter Entrance  Stairs-Number of Steps: 4 Entrance Stairs-Rails: Left Home Layout: One level     Bathroom Shower/Tub: Tub only;Walk-in shower   Bathroom Toilet: Standard     Home Equipment: Agricultural consultant (2 wheels);Crutches          Prior Functioning/Environment Prior Level of Function : Independent/Modified Independent;Driving             Mobility Comments: pt denies use of AD, drives, does  grocery shopping ADLs Comments: indep        OT Problem List: Decreased strength;Decreased range of motion;Decreased activity tolerance;Impaired balance (sitting and/or standing);Decreased safety awareness      OT Treatment/Interventions: Therapeutic exercise;Self-care/ADL training;Energy conservation;DME and/or AE instruction;Therapeutic activities;Patient/family education;Balance training    OT Goals(Current goals can be found in the care plan section) Acute Rehab OT Goals Patient Stated Goal: none stated OT Goal Formulation: With patient Time For Goal Achievement: 12/19/22 Potential to Achieve Goals: Good ADL Goals Pt Will Perform Lower Body Dressing: Independently;sit to/from stand;sitting/lateral leans Pt Will Perform Tub/Shower Transfer: Tub transfer;Shower transfer;Independently;ambulating Additional ADL Goal #1: pt will verbalize x3 energy conservation strategies in prep for ADLs  OT Frequency: Min 1X/week    Co-evaluation              AM-PAC OT "6 Clicks" Daily Activity     Outcome Measure Help from another person eating meals?: None Help from another person taking care of personal grooming?: A Little Help from another person toileting, which includes using toliet, bedpan, or urinal?: A Little Help from another person bathing (including washing, rinsing, drying)?: A Little Help from another person to put on and taking off regular upper body clothing?: A Little Help from another person to put on and taking off regular lower body clothing?: A Little 6 Click Score: 19   End  of Session Equipment Utilized During Treatment: Oxygen Nurse Communication: Mobility status  Activity Tolerance: Patient tolerated treatment well Patient left: in chair;with call bell/phone within reach  OT Visit Diagnosis: Unsteadiness on feet (R26.81);Other abnormalities of gait and mobility (R26.89);Muscle weakness (generalized) (M62.81)                Time: 1610-9604 OT Time Calculation (min): 26 min Charges:  OT General Charges $OT Visit: 1 Visit OT Evaluation $OT Eval Low Complexity: 1 Low OT Treatments $Self Care/Home Management : 8-22 mins  Diane Fila, OTD, OTR/L SecureChat Preferred Acute Rehab [phone]   Diane Freeman 12/05/2022, 12:17 PM

## 2022-12-05 NOTE — Evaluation (Signed)
Physical Therapy Evaluation Patient Details Name: Diane Freeman MRN: [id number] DOB: Apr 10, 1945 Today's Date: 12/05/2022  History of Present Illness  HAIZLEY Freeman is a 77 y.o. female presenting with abdominal pain and vomiting. Pt found to be septic from UTI and in afib w/ RVR. PMH: a-fib, SDH, Brohn's dzs,  GERD, white coat HTN, CKD III   Clinical Impression  Pt admitted with above. PTA pt was living alone, indep with no AD, and driving. Pt now presenting with SOB, 3/4 DOE with activity. Pt to benefit from RW at this time for energy conservation. SpO2 dropped to low 80s on 2LO2 via [place] during ambulation. Recommend family providing 24/7 assist initially to aide in household chores due to pt's SOB with any activity and decreased activity tolerance. Acute PT to cont to follow.        If plan is discharge home, recommend the following: A little help with walking and/or transfers;A little help with bathing/dressing/bathroom;Assistance with cooking/housework;Assist for transportation;Help with stairs or ramp for entrance   Can travel by private vehicle        Equipment Recommendations  (shower seat)  Recommendations for Other Services       Functional Status Assessment Patient has had a recent decline in their functional status and demonstrates the ability to make significant improvements in function in a reasonable and predictable amount of time.     Precautions / Restrictions Precautions Precautions: Fall Precaution Comments: SOB Restrictions Weight Bearing Restrictions: No      Mobility  Bed Mobility Overal bed mobility: Needs Assistance Bed Mobility: Supine to Sit     Supine to sit: Min assist     General bed mobility comments: increased time, minA for trunk elevation, increased WOB, labored effort, SOB upon sitting EOB.    Transfers Overall transfer level: Needs assistance Equipment used: Rolling walker (2 wheels) Transfers: Sit to/from Stand Sit to Stand:  Contact guard assist           General transfer comment: verbal cues to push up from bed, not pull up on walker    Ambulation/Gait Ambulation/Gait assistance: Contact guard assist Gait Distance (Feet): 150 Feet Assistive device: Rolling walker (2 wheels) Gait Pattern/deviations: Step-through pattern, Decreased stride length Gait velocity: dec Gait velocity interpretation: 1.31 - 2.62 ft/sec, indicative of limited community ambulator   General Gait Details: pt with noted SOB, SPO2 down to low 80s on 2 LO2 via [place] with noted 3/4 DOE. used RW for energy conservation to allow for increased ambulation tolerance compared to no AD. with onset of fatigue pt requiring verbal cues to stay in walker and rely less on UEs  Stairs            Wheelchair Mobility     Tilt Bed    Modified Rankin (Stroke Patients Only)       Balance Overall balance assessment: Mild deficits observed, not formally tested (pt to benefit from UE support in standing at this time)                                           Pertinent Vitals/Pain Pain Assessment Pain Assessment: No/denies pain    Home Living Family/patient expects to be discharged to:: Private residence Living Arrangements: Alone Available Help at Discharge: Family;Available 24 hours/day (son, nephew, and sister) Type of Home: Mobile home Home Access: Stairs to enter Entrance Stairs-Rails: Left Entrance Stairs-Number  of Steps: 4   Home Layout: One level Home Equipment: Agricultural consultant (2 wheels);Crutches      Prior Function Prior Level of Function : Independent/Modified Independent;Driving             Mobility Comments: pt denies use of AD, drives, does  grocery shopping ADLs Comments: indep     Extremity/Trunk Assessment   Upper Extremity Assessment Upper Extremity Assessment: Generalized weakness    Lower Extremity Assessment Lower Extremity Assessment: Generalized weakness    Cervical / Trunk  Assessment Cervical / Trunk Assessment: Normal  Communication   Communication Communication: No apparent difficulties  Cognition Arousal: Alert Behavior During Therapy: WFL for tasks assessed/performed Overall Cognitive Status: Within Functional Limits for tasks assessed                                 General Comments: pt did require cues for year but otherwise [facility]        General Comments General comments (skin integrity, edema, etc.): SpO2 at 92% on 1lO2 via [place] at rest, lows 80s on 2LO2 via [place] during ambulation, increased RR    Exercises     Assessment/Plan    PT Assessment Patient needs continued PT services  PT Problem List Decreased strength;Decreased activity tolerance;Decreased balance;Decreased mobility;Decreased knowledge of use of DME;Decreased safety awareness;Cardiopulmonary status limiting activity       PT Treatment Interventions DME instruction;Gait training;Stair training;Functional mobility training;Therapeutic exercise;Therapeutic activities;Balance training    PT Goals (Current goals can be found in the Care Plan section)  Acute Rehab PT Goals Patient Stated Goal: feel better PT Goal Formulation: With patient Time For Goal Achievement: 12/19/22 Potential to Achieve Goals: Good    Frequency Min 1X/week     Co-evaluation               AM-PAC PT "6 Clicks" Mobility  Outcome Measure Help needed turning from your back to your side while in a flat bed without using bedrails?: A Little Help needed moving from lying on your back to sitting on the side of a flat bed without using bedrails?: A Little Help needed moving to and from a bed to a chair (including a wheelchair)?: A Little Help needed standing up from a chair using your arms (e.g., wheelchair or bedside chair)?: A Little Help needed to walk in hospital room?: A Little Help needed climbing 3-5 steps with a railing? : A Little 6 Click Score: 18    End of Session Equipment  Utilized During Treatment: Gait belt;Oxygen Activity Tolerance: Patient limited by fatigue Patient left: in chair;with call bell/phone within reach Nurse Communication: Mobility status (SpO2 sats) PT Visit Diagnosis: Unsteadiness on feet (R26.81);Difficulty in walking, not elsewhere classified (R26.2)    Time: 1610-9604 PT Time Calculation (min) (ACUTE ONLY): 30 min   Charges:   PT Evaluation $PT Eval Moderate Complexity: 1 Mod PT Treatments $Gait Training: 8-22 mins PT General Charges $$ ACUTE PT VISIT: 1 Visit         Lewis Shock, PT, DPT Acute Rehabilitation Services Secure chat preferred Office #: [phone]   Iona Hansen 12/05/2022, 8:24 AM

## 2022-12-05 NOTE — Progress Notes (Signed)
Received a consult for new PIV placement. Arrived to unit and discussed POC with nurse. She stated the patient had pulled out 3 PIVs since 7pm. This patient has had multiple PIVs. At this time VAST recommends not placing further PIVs d/t the limited vasculature and the patient repeatedly pulling out her PIVs. Nurse infusions, tests or procedures ordered at this time requiring a PIV access. Instructed nurse, Marsh Dolly RN, to re-consult IV team is patient status changes. VU. Tomasita Morrow, RN VAST

## 2022-12-05 NOTE — Assessment & Plan Note (Signed)
Pt reports history of asymptomatic Afib. Per EP/ Cards recommendations. Awaiting results of echocardiogram ordered for 9/4. - Increase heparin infusion to 1150 units/hr - Check anti-Xa level in 8 hours and daily while on heparin - Continue to monitor H&H and platelets

## 2022-12-05 NOTE — Progress Notes (Signed)
ANTICOAGULATION CONSULT NOTE  Pharmacy Consult for heparin Indication: atrial fibrillation  No Known Allergies  Patient Measurements: Height: 5\' 3"  (160 cm) Weight: 68.9 kg (151 lb 14.4 oz) IBW/kg (Calculated) : 52.4 Heparin Dosing Weight: 66.5 kg   Vital Signs: Temp: 97.6 F (36.4 C) (09/04 1101) Temp Source: Oral (09/04 1101) BP: 166/84 (09/04 1101) Pulse Rate: 77 (09/04 1101)  Labs: Recent Labs    12/03/22 0251 12/03/22 1406 12/03/22 1601 12/04/22 0224 12/04/22 0836 12/04/22 1605 12/04/22 1736 12/04/22 2110 12/05/22 0848  HGB 11.0*  --   --  9.2* 8.9*  --   --   --  9.5*  HCT 34.6*  --   --  31.3* 27.8*  --   --   --  30.2*  PLT 152  --   --  103* 118*  --   --   --  131*  LABPROT  --   --   --   --   --   --   --  16.9*  --   INR  --   --   --   --   --   --   --  1.4*  --   HEPARINUNFRC  --   --   --   --   --   --   --   --  0.13*  CREATININE 1.05*  --   --  1.14*  --   --   --   --  1.01*  TROPONINIHS  --    < > 14  --   --  18* 19*  --   --    < > = values in this interval not displayed.    Estimated Creatinine Clearance: 42.8 mL/min (A) (by C-G formula based on SCr of 1.01 mg/dL (H)).   Medical History: Past Medical History:  Diagnosis Date   Adrenal nodule (HCC)    Anemia    Blood transfusion without reported diagnosis    Cataract    bil removed   Choledocholithiasis with obstruction    Colitis    Crohn's colitis (HCC) 07/11/2012   Diastolic dysfunction    a. 05/2013 Echo: EF 60-65%, no rwma, GrI DD, mild-mod TR.   Diverticulosis    Fatty liver    GERD (gastroesophageal reflux disease)    Hiatal hernia    History of esophageal stricture    History of left heart catheterization    a. 05/2013 Cath: Nl cors. Nl R heart filling pressures.  No L -> R shunt.  EF 65%.   Hx of gastritis    Palpitations    a. 09/2014 Zio: Rare PACs/PVCs.   Pyelonephritis 05/29/2022   Right shoulder pain 12/03/2022   Sepsis (HCC) 12/03/2022   UTI (urinary tract  infection) 05/14/2013    Assessment: 61 yof presenting with sepsis from UTI and now evaluated with Afib w/ RVR. No AC PTA. Has hx GIB and traumatic SDH. Received enoxaparin 40 mg on 9/3@1532 .  Heparin level subtherapeutic at 0.13 on 950 units/hr. No new CBC. No issues with infusion or s/sx of bleeding per RN. Will avoid heparin bolus at this time given high bleed risk. Per MD consult, patient stable for transition to DOAC. No PPM currently planned per EP note.  Goal of Therapy:  Heparin level 0.3-0.7 units/ml Monitor platelets by anticoagulation protocol: Yes   Plan:  Stop heparin infusion Start apixaban 5mg  PO twice daily Continue to monitor H&H and platelets  Thank you for allowing pharmacy to  be a part of this patient's care.  Thelma Barge, PharmD Clinical Pharmacist

## 2022-12-05 NOTE — Assessment & Plan Note (Deleted)
Since transferring, patient has intermittent episodes of tachypnea (RR 28) with transient desats (93%). Mobility specialist noted that patient desat to 86% but improved with pursed lip breathing. Pt does not have history of COPD, but  - Added on incentive spirometry - Pulm toilet - O2 therapy, maintain SpO2>91%

## 2022-12-05 NOTE — Progress Notes (Addendum)
Daily Progress Note Intern Pager: [phone]  Patient name: Diane Freeman Medical record number: [id number] Date of birth: 05-12-1945 Age: 77 y.o. Gender: female  Primary Care Provider: Para March, DO Consultants: Cardiology, EP, PT, OT Code Status: Full  Pt Overview and Major Events to Date:  Diane Freeman is a 77 y.o.female with a history of Crohn's on mesalamine, paroxysmal A-fib, GERD who was admitted to the [facility] Medicine teaching Service at [facility] for sepsis secondary to UTI. Found to have asymptomatic atrial fibrillation.  9/2 - Pt Admitted for urosepsis 9/3 - Atrial fibrillation noticed, 7 second pause of telemetry noted after administration of metoprolol. 9/4 - EP consulted, pt started on Heparin for anticoagulation   Assessment and Plan: Assessment & Plan Sepsis secondary to UTI (HCC) Pt presented with UTI (primary uti symptom - urinary frequency) and rising fevers. Pt met sepsis criteria. Since transferring, patient has intermittent episodes of tachypnea (RR 28) with transient desats (93%), low DBP (46), low grade fever (100.8, improved to 97.5). Sepsis most likely secondary to UTI at this time, will treat to cover E coli, which she has grown in previous hospitalizations.  - Blood Cx fu - Ceftriaxone 2g IV, two days, transitioned to cefadroxil 1000mg  bid for 3 days. Day 3/5 of abx therapy. Lower extremity edema May be lymphedema vs thrombosis. Pt appeared to have reduced edema this morning on exam.  - LLE doppler US ordered to rule out DVT Abdominal pain Abdominal pain may be secondary to UTI vs other GI pathology.  - CMP, Hepatitis Panel, Hep B surf antigen tomorrow AM - Tylenol PRN for pain - Consider oxy 5mg  prn for breakthrough pain - GPP if symptoms worsen  Hypoxia >>ASSESSMENT AND PLAN FOR SHORTNESS OF BREATH WRITTEN ON 12/05/2022  8:08 AM BY Tzipora Mcinroy, Byrd Hesselbach, MD  Since transferring, patient has intermittent episodes of tachypnea (RR  28) with transient desats (93%). Mobility specialist noted that patient desat to 86% but improved with pursed lip breathing. Pt does not have history of COPD, but the team would like to start her on an inhaler to see if this improves symptoms. - Added on incentive spirometry - Pulm toilet - O2 therapy, maintain SpO2>91%  Atrial fibrillation (HCC) Pt reports history of asymptomatic Afib. Per EP/ Cards recommendations. Awaiting results of echocardiogram ordered for 9/4. - Increase heparin infusion to 1150 units/hr - Check anti-Xa level in 8 hours and daily while on heparin - Continue to monitor H&H and platelets  Chronic and Stable Problems:  Crohns: Cont home mesalamine GERD: Cont formulary equivalent of home omeprazole   FEN/GI: Reg diet VTE Prophylaxis: Heparin   Dispo:Pending PT recommendations  in 2-3 days.   Subjective:  Pt reported feeling well physically this morning. She reported good sleep, some appetite. Denied symptoms of the Afib, but says that she has noticed it before. No abdominal pain, weakness, constipation, urinary issues today.  Objective: Temp:  [97.6 F (36.4 C)-98.7 F (37.1 C)] 97.6 F (36.4 C) (09/04 1101) Pulse Rate:  [69-121] 77 (09/04 1101) Resp:  [17-24] 19 (09/04 1101) BP: (129-166)/(72-84) 166/84 (09/04 1101) SpO2:  [91 %-97 %] 91 % (09/04 1101) Physical Exam: General: Pt sitting upright in chair, patient is alert and conversant.  Cardiovascular: Pt denied chest pain, palpitations. Said she has occasional chest pain after exertion. Respiratory: Pt has mild crackles present in RLL, otherwise clear lungs to auscultation.  Abdomen: Pt denied abdominal pain, no TTP Extremities: Pt denied pain, swelling in extremities.  Laboratory: Most recent CBC Lab Results  Component Value Date   WBC 7.9 12/04/2022   HGB 8.9 (L) 12/04/2022   HCT 27.8 (L) 12/04/2022   MCV 77.9 (L) 12/04/2022   PLT 118 (L) 12/04/2022   Most recent CMP     Latest Ref Rng &  Units 12/05/2022    8:48 AM 12/04/2022    8:36 AM 12/04/2022    2:24 AM  CMP  Glucose 70 - 99 mg/dL 161   96   BUN 8 - 23 mg/dL 14   15   Creatinine 0.96 - 1.00 mg/dL 0.45   4.09   Sodium 811 - 145 mmol/L 129   133   Potassium 3.5 - 5.1 mmol/L 4.4   3.8   Chloride 98 - 111 mmol/L 102   105   CO2 22 - 32 mmol/L 17   15   Calcium 8.9 - 10.3 mg/dL 8.1   8.0   Total Protein 6.5 - 8.1 g/dL 6.6   6.0   Total Bilirubin 0.3 - 1.2 mg/dL 2.7  2.6  2.7   Alkaline Phos 38 - 126 U/L 120   102   AST 15 - 41 U/L 56   92   ALT 0 - 44 U/L 64   86      Other pertinent labs    Latest Reference Range & Units 12/04/22 21:10  Prothrombin Time 11.4 - 15.2 seconds 16.9 (H)  INR 0.8 - 1.2  1.4 (H)  (H): Data is abnormally high   Latest Reference Range & Units 12/04/22 16:05 12/04/22 17:36  Troponin I (High Sensitivity) <18 ng/L 18 (H) 19 (H)  (H): Data is abnormally high   Latest Reference Range & Units 12/04/22 12:15  Iron 28 - 170 ug/dL 15 (L)  UIBC ug/dL 914  TIBC 782 - 956 ug/dL 213  Saturation Ratios 10.4 - 31.8 % 3 (L)  Ferritin 11 - 307 ng/mL 46  Folate >5.9 ng/mL 9.0  (L): Data is abnormally low   Imaging/Diagnostic Tests:  Lower Venous DVT Study  Date of Exam:   12/04/2022  Medical Rec #: [id number]          Accession #:    [id number]  Date of Birth: Jul 25, 1945           Patient Gender: F  Patient Age:   74 years  Exam Location:  [facility]  Procedure:      VAS [place] LOWER EXTREMITY VENOUS (DVT)  Radiologist Impression: No signs of lower vessel disease My interpretation: negative for recent dv   Margaretmary Dys, MD 12/05/2022, 11:04 AM  PGY-1, [facility] FPTS Intern pager: [phone], text pages welcome Secure chat group [facility] Teaching Service

## 2022-12-05 NOTE — Assessment & Plan Note (Signed)
>>  ASSESSMENT AND PLAN FOR ATRIAL FIBRILLATION (HCC) WRITTEN ON 12/05/2022 11:58 AM BY Aubery Douthat, Byrd Hesselbach, MD  Pt reports history of asymptomatic Afib. Per EP/ Cards recommendations. Awaiting results of echocardiogram ordered for 9/4. - Increase heparin infusion to 1150 units/hr - Check anti-Xa level in 8 hours and daily while on heparin - Continue to monitor H&H and platelets

## 2022-12-05 NOTE — Discharge Instructions (Addendum)
Information on my medicine - ELIQUIS (apixaban)  This medication education was reviewed with me or my healthcare representative as part of my discharge preparation.  Why was Eliquis prescribed for you? Eliquis was prescribed for you to reduce the risk of a blood clot forming that can cause a stroke if you have a medical condition called atrial fibrillation (a type of irregular heartbeat).  What do You need to know about Eliquis ? Take your Eliquis TWICE DAILY - one tablet in the morning and one tablet in the evening with or without food. If you have difficulty swallowing the tablet whole please discuss with your pharmacist how to take the medication safely.  Take Eliquis exactly as prescribed by your doctor and DO NOT stop taking Eliquis without talking to the doctor who prescribed the medication.  Stopping may increase your risk of developing a stroke.  Refill your prescription before you run out.  After discharge, you should have regular check-up appointments with your healthcare provider that is prescribing your Eliquis.  In the future your dose may need to be changed if your kidney function or weight changes by a significant amount or as you get older.  What do you do if you miss a dose? If you miss a dose, take it as soon as you remember on the same day and resume taking twice daily.  Do not take more than one dose of ELIQUIS at the same time to make up a missed dose.  Important Safety Information A possible side effect of Eliquis is bleeding. You should call your healthcare provider right away if you experience any of the following: Bleeding from an injury or your nose that does not stop. Unusual colored urine (red or dark brown) or unusual colored stools (red or black). Unusual bruising for unknown reasons. A serious fall or if you hit your head (even if there is no bleeding).  Some medicines may interact with Eliquis and might increase your risk of bleeding or clotting while  on Eliquis. To help avoid this, consult your healthcare provider or pharmacist prior to using any new prescription or non-prescription medications, including herbals, vitamins, non-steroidal anti-inflammatory drugs (NSAIDs) and supplements.  This website has more information on Eliquis (apixaban): http://www.eliquis.com/eliquis/home     Dear Diane Freeman,   Thank you so much for allowing [place] to be part of your care!  You were admitted to [facility] for urine infection and afib.   We treated your UTI with an antibiotic, please take one more dose   For your Afib--we will treat you with Amio 400 daily x52mon until you see your cardiologist on 10/9  POST-HOSPITAL & CARE INSTRUCTIONS  Please let PCP/Specialists know of any changes that were made.  Please see medications section of this packet for any medication changes.  Follow up with cardiology   DOCTOR'S APPOINTMENT & FOLLOW UP CARE INSTRUCTIONS  Future Appointments  Date Time Provider Department Center  12/10/2022  9:45 AM [facility] ANNUAL WELLNESS VISIT [facility]  01/09/2023  1:55 PM [name], PA-C [facility]    RETURN PRECAUTIONS: -Bleeding  -Severe headache  -Weakness, confusion -Chest pain  -Shortness of breath   Take care and be well!  Family Medicine Teaching Service  [place]  [facility]  [address] [place], [place] [address] [phone]

## 2022-12-05 NOTE — Assessment & Plan Note (Signed)
Abdominal pain may be secondary to UTI vs other GI pathology.  - CMP, Hepatitis Panel, Hep B surf antigen tomorrow AM - Tylenol PRN for pain - Consider oxy 5mg  prn for breakthrough pain - GPP if symptoms worsen

## 2022-12-05 NOTE — Assessment & Plan Note (Signed)
>>  ASSESSMENT AND PLAN FOR SHORTNESS OF BREATH WRITTEN ON 12/05/2022  8:08 AM BY Shellyann Wandrey, Byrd Hesselbach, MD  Since transferring, patient has intermittent episodes of tachypnea (RR 28) with transient desats (93%). Mobility specialist noted that patient desat to 86% but improved with pursed lip breathing. Pt does not have history of COPD, but  - Added on incentive spirometry - Pulm toilet - O2 therapy, maintain SpO2>91%

## 2022-12-05 NOTE — Assessment & Plan Note (Signed)
Pt presented with UTI (primary uti symptom - urinary frequency) and rising fevers. Pt met sepsis criteria. Since transferring, patient has intermittent episodes of tachypnea (RR 28) with transient desats (93%), low DBP (46), low grade fever (100.8, improved to 97.5). Sepsis most likely secondary to UTI at this time, will treat to cover E coli, which she has grown in previous hospitalizations.  - Blood Cx fu - Ceftriaxone 2g IV, two days, transitioned to cefadroxil 1000mg  bid for 3 days. Day 3/5 of abx therapy.

## 2022-12-05 NOTE — Progress Notes (Signed)
ANTICOAGULATION CONSULT NOTE - Follow-Up  Pharmacy Consult for heparin Indication: atrial fibrillation  No Known Allergies  Patient Measurements: Height: 5\' 3"  (160 cm) Weight: 68.9 kg (151 lb 14.4 oz) IBW/kg (Calculated) : 52.4 Heparin Dosing Weight: 66.5 kg   Vital Signs: Temp: 97.6 F (36.4 C) (09/04 1101) Temp Source: Oral (09/04 1101) BP: 166/84 (09/04 1101) Pulse Rate: 77 (09/04 1101)  Labs: Recent Labs    12/03/22 0251 12/03/22 1406 12/03/22 1601 12/04/22 0224 12/04/22 0836 12/04/22 1605 12/04/22 1736 12/04/22 2110 12/05/22 0848  HGB 11.0*  --   --  9.2* 8.9*  --   --   --   --   HCT 34.6*  --   --  31.3* 27.8*  --   --   --   --   PLT 152  --   --  103* 118*  --   --   --   --   LABPROT  --   --   --   --   --   --   --  16.9*  --   INR  --   --   --   --   --   --   --  1.4*  --   HEPARINUNFRC  --   --   --   --   --   --   --   --  0.13*  CREATININE 1.05*  --   --  1.14*  --   --   --   --  1.01*  TROPONINIHS  --    < > 14  --   --  18* 19*  --   --    < > = values in this interval not displayed.    Estimated Creatinine Clearance: 42.8 mL/min (A) (by C-G formula based on SCr of 1.01 mg/dL (H)).   Medical History: Past Medical History:  Diagnosis Date   Adrenal nodule (HCC)    Anemia    Blood transfusion without reported diagnosis    Cataract    bil removed   Choledocholithiasis with obstruction    Colitis    Crohn's colitis (HCC) 07/11/2012   Diastolic dysfunction    a. 05/2013 Echo: EF 60-65%, no rwma, GrI DD, mild-mod TR.   Diverticulosis    Fatty liver    GERD (gastroesophageal reflux disease)    Hiatal hernia    History of esophageal stricture    History of left heart catheterization    a. 05/2013 Cath: Nl cors. Nl R heart filling pressures.  No L -> R shunt.  EF 65%.   Hx of gastritis    Palpitations    a. 09/2014 Zio: Rare PACs/PVCs.   Pyelonephritis 05/29/2022   Right shoulder pain 12/03/2022   Sepsis (HCC) 12/03/2022   UTI  (urinary tract infection) 05/14/2013    Medications:  Scheduled:   cefadroxil  1,000 mg Oral BID   ferrous sulfate  325 mg Oral QODAY   mesalamine  1,500 mg Oral Daily   pantoprazole  40 mg Oral Daily    Assessment: Diane Freeman presenting with sepsis from UTI and now evaluated with Afib w/ RVR. No AC PTA. Has hx GIB and traumatic SDH. Received enoxaparin 40 mg on 9/3@1532 .  Heparin level subtherapeutic at 0.13 on 950 units/hr. No new CBC. No issues with infusion or s/sx of bleeding per RN.  Will avoid heparin bolus at this time given high bleed risk.  Goal of Therapy:  Heparin level 0.3-0.7 units/ml  Monitor platelets by anticoagulation protocol: Yes   Plan:  Increase heparin infusion to 1150 units/hr Check anti-Xa level in 8 hours and daily while on heparin Continue to monitor H&H and platelets  Thank you for allowing pharmacy to participate in this patient's care,  Nicole Kindred, PharmD PGY1 Pharmacy Resident 12/05/2022 11:10 AM

## 2022-12-06 ENCOUNTER — Encounter (HOSPITAL_COMMUNITY): Payer: Self-pay | Admitting: Family Medicine

## 2022-12-06 DIAGNOSIS — A419 Sepsis, unspecified organism: Secondary | ICD-10-CM | POA: Diagnosis not present

## 2022-12-06 DIAGNOSIS — E871 Hypo-osmolality and hyponatremia: Secondary | ICD-10-CM | POA: Diagnosis not present

## 2022-12-06 DIAGNOSIS — N390 Urinary tract infection, site not specified: Secondary | ICD-10-CM | POA: Diagnosis not present

## 2022-12-06 DIAGNOSIS — R5381 Other malaise: Secondary | ICD-10-CM | POA: Diagnosis present

## 2022-12-06 DIAGNOSIS — R6889 Other general symptoms and signs: Secondary | ICD-10-CM | POA: Diagnosis present

## 2022-12-06 LAB — COMPREHENSIVE METABOLIC PANEL
ALT: 54 U/L — ABNORMAL HIGH (ref 0–44)
AST: 47 U/L — ABNORMAL HIGH (ref 15–41)
Albumin: 2.5 g/dL — ABNORMAL LOW (ref 3.5–5.0)
Alkaline Phosphatase: 116 U/L (ref 38–126)
Anion gap: 8 (ref 5–15)
BUN: 8 mg/dL (ref 8–23)
CO2: 18 mmol/L — ABNORMAL LOW (ref 22–32)
Calcium: 7.8 mg/dL — ABNORMAL LOW (ref 8.9–10.3)
Chloride: 101 mmol/L (ref 98–111)
Creatinine, Ser: 0.89 mg/dL (ref 0.44–1.00)
GFR, Estimated: >60 mL/min (ref >60)
Glucose, Bld: 95 mg/dL (ref 70–99)
Potassium: 4 mmol/L (ref 3.5–5.1)
Sodium: 127 mmol/L — ABNORMAL LOW (ref 135–145)
Total Bilirubin: 3.2 mg/dL — ABNORMAL HIGH (ref 0.3–1.2)
Total Protein: 6 g/dL — ABNORMAL LOW (ref 6.5–8.1)

## 2022-12-06 LAB — BASIC METABOLIC PANEL
Anion gap: 9 (ref 5–15)
Anion gap: 10 (ref 5–15)
BUN: 8 mg/dL (ref 8–23)
BUN: 8 mg/dL (ref 8–23)
CO2: 19 mmol/L — ABNORMAL LOW (ref 22–32)
CO2: 20 mmol/L — ABNORMAL LOW (ref 22–32)
Calcium: 7.6 mg/dL — ABNORMAL LOW (ref 8.9–10.3)
Calcium: 7.9 mg/dL — ABNORMAL LOW (ref 8.9–10.3)
Chloride: 96 mmol/L — ABNORMAL LOW (ref 98–111)
Chloride: 95 mmol/L — ABNORMAL LOW (ref 98–111)
Creatinine, Ser: 0.82 mg/dL (ref 0.44–1.00)
Creatinine, Ser: 0.86 mg/dL (ref 0.44–1.00)
GFR, Estimated: >60 mL/min (ref >60)
GFR, Estimated: >60 mL/min (ref >60)
Glucose, Bld: 136 mg/dL — ABNORMAL HIGH (ref 70–99)
Glucose, Bld: 129 mg/dL — ABNORMAL HIGH (ref 70–99)
Potassium: 3.3 mmol/L — ABNORMAL LOW (ref 3.5–5.1)
Potassium: 3.7 mmol/L (ref 3.5–5.1)
Sodium: 124 mmol/L — ABNORMAL LOW (ref 135–145)
Sodium: 125 mmol/L — ABNORMAL LOW (ref 135–145)

## 2022-12-06 LAB — CBC
HCT: 28.2 % — ABNORMAL LOW (ref 36.0–46.0)
Hemoglobin: 9 g/dL — ABNORMAL LOW (ref 12.0–15.0)
MCH: 24.8 pg — ABNORMAL LOW (ref 26.0–34.0)
MCHC: 31.9 g/dL (ref 30.0–36.0)
MCV: 77.7 fL — ABNORMAL LOW (ref 80.0–100.0)
Platelets: 119 10*3/uL — ABNORMAL LOW (ref 150–400)
RBC: 3.63 MIL/uL — ABNORMAL LOW (ref 3.87–5.11)
RDW: 17.2 % — ABNORMAL HIGH (ref 11.5–15.5)
WBC: 6 10*3/uL (ref 4.0–10.5)
nRBC: 0 % (ref 0.0–0.2)

## 2022-12-06 LAB — MAGNESIUM: Magnesium: 1.8 mg/dL (ref 1.7–2.4)

## 2022-12-06 LAB — OSMOLALITY: Osmolality: 277 mosm/kg (ref 275–295)

## 2022-12-06 MED ORDER — POTASSIUM CHLORIDE CRYS ER 20 MEQ PO TBCR
40.0000 meq | EXTENDED_RELEASE_TABLET | Freq: Two times a day (BID) | ORAL | Status: AC
  Administered 2022-12-06 – 2022-12-07 (×2): 40 meq via ORAL
  Filled 2022-12-06 (×2): qty 2

## 2022-12-06 MED ORDER — SODIUM CHLORIDE 0.9% FLUSH: 10.0000 mL | INTRAVENOUS | Status: DC | PRN

## 2022-12-06 MED ORDER — MAGNESIUM SULFATE 2 GM/50ML IV SOLN
2.0000 g | Freq: Once | INTRAVENOUS | Status: AC
  Administered 2022-12-06: 2 g via INTRAVENOUS
  Filled 2022-12-06: qty 50

## 2022-12-06 MED ORDER — IPRATROPIUM-ALBUTEROL 0.5-2.5 (3) MG/3ML IN SOLN
3.0000 mL | Freq: Once | RESPIRATORY_TRACT | Status: AC
  Administered 2022-12-06: 3 mL via RESPIRATORY_TRACT
  Filled 2022-12-06: qty 3

## 2022-12-06 NOTE — Assessment & Plan Note (Addendum)
Pt has had a clinically asymptomatic but downward trend in her sodium levels. [phone]. This could be due to the patient's new amiodarone medication. The only other medication she is taking in the hospital which might impact sodium levels is pantoprazole, but that is a variant of the patient's home omeprazole.  - Collecting urine sodium level and an additional BMP to assess whether there is salt wasting at the kidneys or elsewhere.

## 2022-12-06 NOTE — TOC Progression Note (Signed)
Transition of Care [facility] - Progression Note    Patient Details  Name: Diane Freeman MRN: [id number] Date of Birth: [birth year]  Transition of Care [facility]) CM/SW Contact  Leander Rams, LCSW Phone Number: 12/06/2022, 4:09 PM  Clinical Narrative:    CSW provided pt with list of [facility] agency choices.    Expected Discharge Plan: Home/Self Care Barriers to Discharge: Continued Medical Work up  Expected Discharge Plan and Services In-house Referral: NA Discharge Planning Services: CM Consult Post Acute Care Choice: NA Living arrangements for the past 2 months: Single Family Home                 DME Arranged: N/A DME Agency: NA       HH Arranged: NA           Social Determinants of Health (SDOH) Interventions SDOH Screenings   Food Insecurity: No Food Insecurity (12/03/2022)  Housing: Low Risk  (12/03/2022)  Transportation Needs: No Transportation Needs (12/03/2022)  Utilities: Not At Risk (12/03/2022)  Depression (PHQ2-9): Low Risk  (06/05/2022)  Tobacco Use: Low Risk  (12/03/2022)    Readmission Risk Interventions     No data to display

## 2022-12-06 NOTE — Progress Notes (Signed)
Nurse requested Mobility Specialist to perform oxygen saturation test with pt which includes removing pt from oxygen both at rest and while ambulating.  Below are the results from that testing.     Patient Saturations on Room Air at Rest = spO2 95%  Patient Saturations on Room Air while Ambulating = sp02 94% .  Rested and performed pursed lip breathing for 1 minute with sp02 at 90%.  Reported results to nurse.

## 2022-12-06 NOTE — Progress Notes (Addendum)
Mobility Specialist Progress Note:   12/06/22 0950  Mobility  Activity Ambulated with assistance in hallway  Level of Assistance Contact guard assist, steadying assist  Assistive Device None  Distance Ambulated (ft) 128 ft  Activity Response Tolerated well  Mobility Referral Yes  $Mobility charge 1 Mobility  Mobility Specialist Start Time (ACUTE ONLY) 0934  Mobility Specialist Stop Time (ACUTE ONLY) 0945  Mobility Specialist Time Calculation (min) (ACUTE ONLY) 11 min    Pre Mobility: 75 HR , 95% SpO2 RA During Mobility: 100 HR ,  90%-95% SpO2 RA Post Mobility: 84 HR , 94% SpO2 RA  Pt received in bed, agreeable to mobility. Sight unsteadiness in gait leaning towards left requiring CG for steadying and hand rails for assistance, otherwise asymptomatic throughout. Pt denied any SOB or fatigue during session. Pt requesting to use BR returning to room. Void successful. Pt left in bed with call bell in reach and all needs met.   Leory Plowman  Mobility Specialist Please contact via [employer] office at [phone]
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
## 2022-12-06 NOTE — Plan of Care (Signed)
?  Problem: Nutrition: ?Goal: Adequate nutrition will be maintained ?Outcome: Progressing ?  ?Problem: Coping: ?Goal: Level of anxiety will decrease ?Outcome: Progressing ?  ?Problem: Elimination: ?Goal: Will not experience complications related to bowel motility ?Outcome: Progressing ?Goal: Will not experience complications related to urinary retention ?Outcome: Progressing ?  ?Problem: Pain Managment: ?Goal: General experience of comfort will improve ?Outcome: Progressing ?  ?Problem: Safety: ?Goal: Ability to remain free from injury will improve ?Outcome: Progressing ?  ?

## 2022-12-06 NOTE — Assessment & Plan Note (Signed)
>>  ASSESSMENT AND PLAN FOR ATRIAL FIBRILLATION (HCC) WRITTEN ON 12/06/2022  8:02 AM BY Zaidan Keeble, Byrd Hesselbach, MD  Pt reports history of asymptomatic Afib. Per EP/ Cards recommendations. Awaiting results of echocardiogram ordered for 9/4. - Increase heparin infusion to 1150 units/hr - Check anti-Xa level in 8 hours and daily while on heparin - Continue to monitor H&H and platelets

## 2022-12-06 NOTE — Progress Notes (Signed)
Daily Progress Note Intern Pager: [phone]  Patient name: Diane Freeman Medical record number: [id number] Date of birth: 12-23-1945 Age: 77 y.o. Gender: female  Primary Care Provider: Para March, DO Consultants: Cardiology, EP (complete), OT (complete), PT  Code Status: Full  Pt Overview and Major Events to Date:  Diane Freeman is a 77 y.o.female with a history of Crohn's on mesalamine, paroxysmal A-fib, GERD who was admitted to the [facility] Medicine teaching Service at [facility] for sepsis secondary to UTI. Found to have asymptomatic atrial fibrillation.    9/2 - Pt Admitted for urosepsis 9/3 - Atrial fibrillation noticed, 7 second pause of telemetry noted after administration of metoprolol. 9/4 - EP consulted, pt started on Heparin for anticoagulation 9/5 - EP signed off, set up 3 week follow-up. Pt had hyponatremia on her AM labs.   Assessment and Plan: Assessment & Plan Sepsis secondary to UTI (HCC) Pt presented with UTI (primary uti symptom - urinary frequency) and rising fevers. Pt met sepsis criteria. Since transferring, patient has intermittent episodes of tachypnea (RR 28) with transient desats (93%), low DBP (46), low grade fever (100.8, improved to 97.5). Sepsis most likely secondary to UTI at this time, will treat to cover E coli, which she has grown in previous hospitalizations.  - Blood Cx fu - Ceftriaxone 2g IV, two days (9/2-9/3) - 9/4 transitioned to cefadroxil 1000mg  bid for 3 days. Day 4/5 of abx therapy. Lower extremity edema (Resolved: 12/06/2022) May be lymphedema vs thrombosis. Pt had no edema this morning on exam.  - LLE doppler US ordered to rule out DVT Abdominal pain Abdominal pain may be secondary to UTI vs other GI pathology.  - CMP, Hepatitis Panel, Hep B surf antigen tomorrow AM - Tylenol PRN for pain - Consider oxy 5mg  prn for breakthrough pain - GPP if symptoms worsen  Hypoxia >>ASSESSMENT AND PLAN FOR SHORTNESS OF BREATH WRITTEN ON  12/05/2022  8:08 AM BY Dejanee Thibeaux, Byrd Hesselbach, MD  Since transferring, patient has intermittent episodes of tachypnea (RR 28) with transient desats (93%). Mobility specialist noted that patient desat to 86% but improved with pursed lip breathing. Pt does not have history of COPD, but the team would like to start her on an inhaler to see if this improves symptoms. - Added on incentive spirometry - Pulm toilet - O2 therapy, maintain SpO2>91% - Pt was given 1x nebulizer treatment with duonebs to assess whether that would be of benefit.  Paroxysmal atrial fibrillation with RVR (HCC) >>ASSESSMENT AND PLAN FOR ATRIAL FIBRILLATION (HCC) WRITTEN ON 12/06/2022  8:02 AM BY Alik Mawson, Byrd Hesselbach, MD  Pt reports history of asymptomatic Afib. Per EP/ Cards recommendations. Awaiting results of echocardiogram ordered for 9/4. - Increase heparin infusion to 1150 units/hr - Check anti-Xa level in 8 hours and daily while on heparin - Continue to monitor H&H and platelets Hyponatremia Pt has had a clinically asymptomatic but downward trend in her sodium levels. [phone]. This could be due to the patient's new amiodarone medication. The only other medication she is taking in the hospital which might impact sodium levels is pantoprazole, but that is a variant of the patient's home omeprazole.  - Collecting urine sodium level and an additional BMP to assess whether there is salt wasting at the kidneys or elsewhere. Physical deconditioning Pt has physical deconditioning and would benefit from PT services in the outpatient setting. Expected D/C 9/6. - Outpatient PT referral  Chronic and Stable Problems:  Crohns: Cont home mesalamine GERD:  Cont formulary equivalent of home omeprazole   FEN/GI: Reg diet VTE Prophylaxis: Heparin Dispo: Tomorrow (12/07/2022) if patient remains stable overnight.    Subjective:  Pt reported feeling well this morning. She stated that she slept well overnight, her  appetite is good, and her mood is "eager to go home." When asked about her tendency to remove her own IV's, she stated "well, I had three of them in my arm and there was nothing going into any of them." EP joined while I was finishing up my exam, they discussed their plans for a 3 week follow-up with decrease in amiodarone at that time. Pt still having intermittent SOB, but is otherwise improved.  Objective: Temp:  [97.6 F (36.4 C)-98.6 F (37 C)] 97.9 F (36.6 C) (09/05 0719) Pulse Rate:  [74-83] 74 (09/05 0719) Resp:  [15-21] 15 (09/05 0719) BP: (134-169)/(64-84) 146/84 (09/05 0719) SpO2:  [90 %-94 %] 94 % (09/05 0719)  Physical Exam: General: Pt was AOX4, reported good sleep, good appetite. Pt looked well.   Cardiovascular: Pt still has slight irregularities to her heart rhythm, but within her limits. No murmurs, rubs, or gallops appreciated. Respiratory: CTAB. Improved relative to yesterday. Pt O2 sats looked fine when I d/c'd her nasal canula Abdomen: NTTP, no pain in any quadrant. Extremities: No LE edema.  Laboratory: Most recent CBC Lab Results  Component Value Date   WBC 6.0 12/06/2022   HGB 9.0 (L) 12/06/2022   HCT 28.2 (L) 12/06/2022   MCV 77.7 (L) 12/06/2022   PLT 119 (L) 12/06/2022   Most recent BMP    Latest Ref Rng & Units 12/06/2022    2:11 AM  BMP  Glucose 70 - 99 mg/dL 95   BUN 8 - 23 mg/dL 8   Creatinine 3.29 - 5.18 mg/dL 8.41   Sodium 660 - 630 mmol/L 127   Potassium 3.5 - 5.1 mmol/L 4.0   Chloride 98 - 111 mmol/L 101   CO2 22 - 32 mmol/L 18   Calcium 8.9 - 10.3 mg/dL 7.8    Current Outpatient Medications  Medication Instructions   Cholecalciferol (VITAMIN D-3 PO) 1 capsule, Oral, Daily   mesalamine (APRISO) 1.5 g, Oral, Daily, Please schedule a follow up visit for further refills   omeprazole (PRILOSEC) 20 mg, Oral, Daily    amiodarone  400 mg Oral Daily   apixaban  5 mg Oral BID   cefadroxil  1,000 mg Oral BID   ferrous sulfate  325 mg Oral  QODAY   mesalamine  1,500 mg Oral Daily   pantoprazole  40 mg Oral Daily    Other pertinent labs: Fractional excretion of sodium pending.   Imaging/Diagnostic Tests:  Patient Name: Diane Freeman Date of Exam: 12/05/2022  IMPRESSIONS   1. Left ventricular ejection fraction, by estimation, is 55 to 60%. The left ventricle has normal function. The left ventricle has no regional wall motion abnormalities. There is mild left ventricular hypertrophy. Left ventricular diastolic parameters  are consistent with Grade II diastolic dysfunction (pseudonormalization).   2. Right ventricular systolic function is normal. The right ventricular size is normal. There is moderately elevated pulmonary artery systolic pressure.   3. Left atrial size was mild to moderately dilated.   4. The mitral valve is grossly normal. Trivial mitral valve  regurgitation.   5. Tricuspid valve regurgitation is moderate.   6. The aortic valve is tricuspid. Aortic valve regurgitation is not  visualized.   7. The inferior Diane cava is normal in  size with greater than 50%  respiratory variability, suggesting right atrial pressure of 3 mmHg.   Margaretmary Dys, MD 12/06/2022, 7:59 AM  PGY-1, [facility] FPTS Intern pager: [phone], text pages welcome Secure chat group [facility] Teaching Service

## 2022-12-06 NOTE — Assessment & Plan Note (Signed)
Pt has physical deconditioning and would benefit from PT services in the outpatient setting. Expected D/C 9/6. - Outpatient PT referral

## 2022-12-06 NOTE — Assessment & Plan Note (Addendum)
Pt presented with UTI (primary uti symptom - urinary frequency) and rising fevers. Pt met sepsis criteria. Since transferring, patient has intermittent episodes of tachypnea (RR 28) with transient desats (93%), low DBP (46), low grade fever (100.8, improved to 97.5). Sepsis most likely secondary to UTI at this time, will treat to cover E coli, which she has grown in previous hospitalizations.  - Blood Cx fu - Ceftriaxone 2g IV, two days (9/2-9/3) - 9/4 transitioned to cefadroxil 1000mg  bid for 3 days. Day 4/5 of abx therapy.

## 2022-12-06 NOTE — Assessment & Plan Note (Deleted)
Pt reports history of asymptomatic Afib. Per EP/ Cards recommendations. Awaiting results of echocardiogram ordered for 9/4. - Increase heparin infusion to 1150 units/hr - Check anti-Xa level in 8 hours and daily while on heparin - Continue to monitor H&H and platelets

## 2022-12-06 NOTE — Assessment & Plan Note (Addendum)
>>  ASSESSMENT AND PLAN FOR SHORTNESS OF BREATH WRITTEN ON 12/05/2022  8:08 AM BY Darla Mcdonald, Byrd Hesselbach, MD  Since transferring, patient has intermittent episodes of tachypnea (RR 28) with transient desats (93%). Mobility specialist noted that patient desat to 86% but improved with pursed lip breathing. Pt does not have history of COPD, but the team would like to start her on an inhaler to see if this improves symptoms. - Added on incentive spirometry - Pulm toilet - O2 therapy, maintain SpO2>91% - Pt was given 1x nebulizer treatment with duonebs to assess whether that would be of benefit.

## 2022-12-06 NOTE — Assessment & Plan Note (Addendum)
May be lymphedema vs thrombosis. Pt had no edema this morning on exam.  - LLE doppler US ordered to rule out DVT

## 2022-12-06 NOTE — Progress Notes (Signed)
Physical Therapy Treatment Patient Details Name: Diane Freeman MRN: [id number] DOB: Feb 12, 1946 Today's Date: 12/06/2022   History of Present Illness Diane Freeman is a 77 y.o. female presenting with abdominal pain and vomiting. Pt found to be septic from UTI and in afib w/ RVR. PMH: a-fib, SDH, Brohn's dzs,  GERD, white coat HTN, CKD III    PT Comments  Pt received in supine and agreeable to session. Pt making good progress towards functional mobility goals. Pt able to perform gait trial in hallway without AD, however demonstrates increased instability without UE support requiring CGA for safety. Education provided on use of RW at home for increased safety and reduced fall risk. Pt able to complete x5 serial STS from recliner and standing marching without UE support with no LOB. Pt requesting to use the bathroom at the end of the session and is able to void and perform pericare without assist. SpO2 >88% throughout session on RA. Pt continues to benefit from PT services to progress toward functional mobility goals.    If plan is discharge home, recommend the following: A little help with walking and/or transfers;A little help with bathing/dressing/bathroom;Assistance with cooking/housework;Assist for transportation;Help with stairs or ramp for entrance   Can travel by private vehicle        Equipment Recommendations   (shower seat)    Recommendations for Other Services       Precautions / Restrictions Precautions Precautions: Fall Restrictions Weight Bearing Restrictions: No     Mobility  Bed Mobility Overal bed mobility: Needs Assistance Bed Mobility: Supine to Sit     Supine to sit: HOB elevated, Supervision          Transfers Overall transfer level: Needs assistance Equipment used: None Transfers: Sit to/from Stand Sit to Stand: Supervision           General transfer comment: STS from various surfaces with supervision for safety. x5 serial STS from recliner  without UE support with no LOB, but slightly decreased eccentric control when sitting    Ambulation/Gait Ambulation/Gait assistance: Contact guard assist Gait Distance (Feet): 150 Feet Assistive device: None Gait Pattern/deviations: Step-through pattern, Decreased stride length, Drifts right/left Gait velocity: dec     General Gait Details: Pt demonstrating increased unsteadiness without AD and intermittently reaches to railing for support. CGA for safety due to intermittent drifting, but no LOB. One standing rest break due to fatigue. SpO2 >88% on RA       Balance Overall balance assessment: Needs assistance Sitting-balance support: Bilateral upper extremity supported, Feet supported Sitting balance-Leahy Scale: Good Sitting balance - Comments: sitting EOB   Standing balance support: No upper extremity supported, During functional activity Standing balance-Leahy Scale: Fair Standing balance comment: Pt demonstrating some unsteadiness, but no LOB without AD                            Cognition Arousal: Alert Behavior During Therapy: WFL for tasks assessed/performed Overall Cognitive Status: Within Functional Limits for tasks assessed                                          Exercises Other Exercises Other Exercises: x5 STS without UE support    General Comments General comments (skin integrity, edema, etc.): SpO2 as low as 88% during ambulation on RA and quickly improving to >90% with pursed lip  breathing      Pertinent Vitals/Pain Pain Assessment Pain Assessment: No/denies pain     PT Goals (current goals can now be found in the care plan section) Progress towards PT goals: Progressing toward goals    Frequency    Min 1X/week      PT Plan         AM-PAC PT "6 Clicks" Mobility   Outcome Measure  Help needed turning from your back to your side while in a flat bed without using bedrails?: A Little Help needed moving from  lying on your back to sitting on the side of a flat bed without using bedrails?: A Little Help needed moving to and from a bed to a chair (including a wheelchair)?: A Little Help needed standing up from a chair using your arms (e.g., wheelchair or bedside chair)?: A Little Help needed to walk in hospital room?: A Little Help needed climbing 3-5 steps with a railing? : A Little 6 Click Score: 18    End of Session Equipment Utilized During Treatment: Gait belt Activity Tolerance: Patient tolerated treatment well Patient left: in chair;with call bell/phone within reach Nurse Communication: Mobility status PT Visit Diagnosis: Unsteadiness on feet (R26.81);Difficulty in walking, not elsewhere classified (R26.2)     Time: 9604-5409 PT Time Calculation (min) (ACUTE ONLY): 17 min  Charges:    $Gait Training: 8-22 mins PT General Charges $$ ACUTE PT VISIT: 1 Visit                    Johny Shock, PTA Acute Rehabilitation Services Secure Chat Preferred  [phone]    Johny Shock 12/06/2022, 3:48 PM

## 2022-12-06 NOTE — TOC Progression Note (Signed)
Transition of Care [facility]) - Progression Note    Patient Details  Name: Diane Freeman MRN: [id number] Date of Birth: [birth year]  [facility]) CM/SW Contact  Lockie Pares, RN Phone Number: 12/06/2022, 1:55 PM  Clinical Narrative:    Spoke to patient regarding home health and  DME. She has a walker at home. Her sisiter is going to stay with her a couple of weeks and can transport her home on discharge. A list from Medicare.gov will be given to patient to look over home health choices. Will follow up tonight or tomorrow for choice. Tub seat recommended as well.  Discharge is planned for tomorrow   Expected Discharge Plan: Home/Self Care Barriers to Discharge: Continued Medical Work up  Expected Discharge Plan and Services In-house Referral: NA Discharge Planning Services: CM Consult Post Acute Care Choice: NA Living arrangements for the past 2 months: Single Family Home                 DME Arranged: N/A DME Agency: NA       HH Arranged: NA           Social Determinants of Health (SDOH) Interventions SDOH Screenings   Food Insecurity: No Food Insecurity (12/03/2022)  Housing: Low Risk  (12/03/2022)  Transportation Needs: No Transportation Needs (12/03/2022)  Utilities: Not At Risk (12/03/2022)  Depression (PHQ2-9): Low Risk  (06/05/2022)  Tobacco Use: Low Risk  (12/03/2022)    Readmission Risk Interventions     No data to display

## 2022-12-06 NOTE — Assessment & Plan Note (Signed)
Abdominal pain may be secondary to UTI vs other GI pathology.  - CMP, Hepatitis Panel, Hep B surf antigen tomorrow AM - Tylenol PRN for pain - Consider oxy 5mg  prn for breakthrough pain - GPP if symptoms worsen

## 2022-12-06 NOTE — Care Management Important Message (Signed)
Important Message  Patient Details  Name: ANGELUS CONCA MRN: [id number] Date of Birth: [birth year]   Medicare Important Message Given:  Yes     Janace Decker Stefan Church 12/06/2022, 2:43 PM

## 2022-12-07 ENCOUNTER — Encounter (HOSPITAL_COMMUNITY): Payer: Self-pay | Admitting: Family Medicine

## 2022-12-07 ENCOUNTER — Ambulatory Visit: Payer: Medicare Other

## 2022-12-07 ENCOUNTER — Other Ambulatory Visit (HOSPITAL_COMMUNITY): Payer: Self-pay

## 2022-12-07 DIAGNOSIS — A419 Sepsis, unspecified organism: Secondary | ICD-10-CM | POA: Diagnosis not present

## 2022-12-07 DIAGNOSIS — N390 Urinary tract infection, site not specified: Secondary | ICD-10-CM | POA: Diagnosis not present

## 2022-12-07 LAB — CBC
HCT: 27.8 % — ABNORMAL LOW (ref 36.0–46.0)
Hemoglobin: 8.8 g/dL — ABNORMAL LOW (ref 12.0–15.0)
MCH: 24.1 pg — ABNORMAL LOW (ref 26.0–34.0)
MCHC: 31.7 g/dL (ref 30.0–36.0)
MCV: 76.2 fL — ABNORMAL LOW (ref 80.0–100.0)
Platelets: 144 10*3/uL — ABNORMAL LOW (ref 150–400)
RBC: 3.65 MIL/uL — ABNORMAL LOW (ref 3.87–5.11)
RDW: 17.2 % — ABNORMAL HIGH (ref 11.5–15.5)
WBC: 6.4 10*3/uL (ref 4.0–10.5)
nRBC: 0 % (ref 0.0–0.2)

## 2022-12-07 LAB — NA AND K (SODIUM & POTASSIUM), RAND UR
Potassium Urine: 39 mmol/L
Sodium, Ur: 144 mmol/L

## 2022-12-07 LAB — OSMOLALITY, URINE: Osmolality, Ur: 668 mosm/kg (ref 300–900)

## 2022-12-07 LAB — COMPREHENSIVE METABOLIC PANEL
ALT: 49 U/L — ABNORMAL HIGH (ref 0–44)
AST: 51 U/L — ABNORMAL HIGH (ref 15–41)
Albumin: 2.5 g/dL — ABNORMAL LOW (ref 3.5–5.0)
Alkaline Phosphatase: 144 U/L — ABNORMAL HIGH (ref 38–126)
Anion gap: 11 (ref 5–15)
BUN: 8 mg/dL (ref 8–23)
CO2: 21 mmol/L — ABNORMAL LOW (ref 22–32)
Calcium: 7.9 mg/dL — ABNORMAL LOW (ref 8.9–10.3)
Chloride: 95 mmol/L — ABNORMAL LOW (ref 98–111)
Creatinine, Ser: 0.84 mg/dL (ref 0.44–1.00)
GFR, Estimated: >60 mL/min (ref >60)
Glucose, Bld: 107 mg/dL — ABNORMAL HIGH (ref 70–99)
Potassium: 3.7 mmol/L (ref 3.5–5.1)
Sodium: 127 mmol/L — ABNORMAL LOW (ref 135–145)
Total Bilirubin: 2.6 mg/dL — ABNORMAL HIGH (ref 0.3–1.2)
Total Protein: 6.1 g/dL — ABNORMAL LOW (ref 6.5–8.1)

## 2022-12-07 LAB — CORTISOL-AM, BLOOD: Cortisol - AM: 20.4 ug/dL (ref 6.7–22.6)

## 2022-12-07 LAB — MAGNESIUM: Magnesium: 2 mg/dL (ref 1.7–2.4)

## 2022-12-07 MED ORDER — FERROUS SULFATE 325 (65 FE) MG PO TABS
325.0000 mg | ORAL_TABLET | ORAL | 0 refills | Status: DC
  Filled 2022-12-07: qty 15, 30d supply, fill #0

## 2022-12-07 MED ORDER — MESALAMINE ER 250 MG PO CPCR
500.0000 mg | ORAL_CAPSULE | Freq: Three times a day (TID) | ORAL | Status: DC
  Administered 2022-12-07: 500 mg via ORAL
  Filled 2022-12-07 (×3): qty 2

## 2022-12-07 MED ORDER — MESALAMINE ER 250 MG PO CPCR
1500.0000 mg | ORAL_CAPSULE | Freq: Three times a day (TID) | ORAL | Status: DC
  Filled 2022-12-07: qty 6

## 2022-12-07 MED ORDER — CEFADROXIL 500 MG PO CAPS
1000.0000 mg | ORAL_CAPSULE | Freq: Two times a day (BID) | ORAL | 0 refills | Status: DC
  Filled 2022-12-07: qty 2, 1d supply, fill #0

## 2022-12-07 MED ORDER — AMIODARONE HCL 200 MG PO TABS
400.0000 mg | ORAL_TABLET | Freq: Every day | ORAL | 0 refills | Status: DC
  Filled 2022-12-07: qty 70, 35d supply, fill #0

## 2022-12-07 MED ORDER — APIXABAN 5 MG PO TABS
5.0000 mg | ORAL_TABLET | Freq: Two times a day (BID) | ORAL | 0 refills | Status: DC
  Filled 2022-12-07: qty 60, 30d supply, fill #0

## 2022-12-07 NOTE — Progress Notes (Signed)
Mobility Specialist Progress Note:   12/07/22 1056  Mobility  Activity Ambulated with assistance in hallway  Level of Assistance Contact guard assist, steadying assist  Assistive Device None  Distance Ambulated (ft) 150 ft  Activity Response Tolerated well  Mobility Referral Yes  $Mobility charge 1 Mobility  Mobility Specialist Start Time (ACUTE ONLY) 1030  Mobility Specialist Stop Time (ACUTE ONLY) 1040  Mobility Specialist Time Calculation (min) (ACUTE ONLY) 10 min   Pre Mobility: 77 HR  During Mobility: 90 HR  Post Mobility: 76 HR , 93% SpO2 RA   Pt received in bed, agreeable to mobility. C/o pain in RLE causing slight unsteadiness in gait, otherwise asymptomatic throughout. Pt returned to bed with call bell in hand and all needs met.   Leory Plowman  Mobility Specialist Please contact via [employer] office at [phone]

## 2022-12-07 NOTE — TOC Transition Note (Signed)
Transition of Care [facility]) - CM/SW Discharge Note   Patient Details  Name: Diane Freeman MRN: [id number] Date of Birth: 1945/12/01  Transition of Care [facility]) CM/SW Contact:  Lockie Pares, RN Phone Number: 12/07/2022, 10:55 AM   Clinical Narrative:    Spoke to patient regarding DC planning nad home health. She was given a Medicare list yesterday. She spoke to her sister and she is staying with her a couple of weeks. She doesn't think she needs home health, so she is declining. RN aware of declinging of services  No further needs TOC signing off   Final next level of care: Home/Self Care Barriers to Discharge: No Barriers Identified   Patient Goals and CMS Choice   Choice offered to / list presented to : NA  Discharge Placement                         Discharge Plan and Services Additional resources added to the After Visit Summary for   In-house Referral: NA Discharge Planning Services: CM Consult Post Acute Care Choice: NA          DME Arranged: N/A DME Agency: NA       HH Arranged: NA          Social Determinants of Health (SDOH) Interventions SDOH Screenings   Food Insecurity: No Food Insecurity (12/03/2022)  Housing: Low Risk  (12/03/2022)  Transportation Needs: No Transportation Needs (12/03/2022)  Utilities: Not At Risk (12/03/2022)  Depression (PHQ2-9): Low Risk  (06/05/2022)  Tobacco Use: Low Risk  (12/03/2022)     Readmission Risk Interventions     No data to display

## 2022-12-07 NOTE — Progress Notes (Signed)
Discharge education provided to patient and sister. No further questions. Patient discharged to discharge lounge.

## 2022-12-07 NOTE — Plan of Care (Signed)

## 2022-12-07 NOTE — Plan of Care (Signed)

## 2022-12-07 NOTE — Progress Notes (Signed)
Occupational Therapy Treatment Patient Details Name: Diane Freeman MRN: [id number] DOB: May 09, 1945 Today's Date: 12/07/2022   History of present illness Diane Freeman is a 77 y.o. female presenting with abdominal pain and vomiting. Pt found to be septic from UTI and in afib w/ RVR. PMH: a-fib, SDH, Brohn's dzs,  GERD, white coat HTN, CKD III   OT comments  Patient with good progress toward patient focused goals. Patient remains a little unsteady and is SOB with activity.  Min VC's for energy conservation during functional task.  Overall she is needing min supervision for ADL tasks with no physical assist.  OT can continue efforts in the acute setting to address deficits, and HH OT can be considered if the the patient agrees.         If plan is discharge home, recommend the following:  A little help with walking and/or transfers;A little help with bathing/dressing/bathroom;Assistance with cooking/housework;Assist for transportation   Equipment Recommendations  Tub/shower seat    Recommendations for Other Services      Precautions / Restrictions Precautions Precautions: Fall Precaution Comments: SOB Restrictions Weight Bearing Restrictions: No       Mobility Bed Mobility Overal bed mobility: Modified Independent                  Transfers Overall transfer level: Needs assistance   Transfers: Sit to/from Stand, Bed to chair/wheelchair/BSC Sit to Stand: Modified independent (Device/Increase time)     Step pivot transfers: Supervision           Balance Overall balance assessment: Mild deficits observed, not formally tested                                         ADL either performed or assessed with clinical judgement   ADL Overall ADL's : At baseline                                       General ADL Comments: Generalized supervision and cues for breaks    Extremity/Trunk Assessment Upper Extremity Assessment Upper  Extremity Assessment: Overall WFL for tasks assessed   Lower Extremity Assessment Lower Extremity Assessment: Defer to PT evaluation   Cervical / Trunk Assessment Cervical / Trunk Assessment: Normal    Vision Patient Visual Report: No change from baseline     Perception Perception Perception: Within Functional Limits   Praxis Praxis Praxis: WFL    Cognition Arousal: Alert Behavior During Therapy: WFL for tasks assessed/performed Overall Cognitive Status: Within Functional Limits for tasks assessed                                                             Pertinent Vitals/ Pain       Pain Assessment Pain Assessment: No/denies pain                                                          Frequency  Min 1X/week        Progress Toward Goals  OT Goals(current goals can now be found in the care plan section)  Progress towards OT goals: Progressing toward goals  Acute Rehab OT Goals OT Goal Formulation: With patient Time For Goal Achievement: 12/19/22 Potential to Achieve Goals: Good  Plan      Co-evaluation                 AM-PAC OT "6 Clicks" Daily Activity     Outcome Measure   Help from another person eating meals?: None Help from another person taking care of personal grooming?: None Help from another person toileting, which includes using toliet, bedpan, or urinal?: None Help from another person bathing (including washing, rinsing, drying)?: A Little Help from another person to put on and taking off regular upper body clothing?: None Help from another person to put on and taking off regular lower body clothing?: A Little 6 Click Score: 22    End of Session    OT Visit Diagnosis: Unsteadiness on feet (R26.81);Other abnormalities of gait and mobility (R26.89);Muscle weakness (generalized) (M62.81)   Activity Tolerance Patient tolerated treatment well   Patient Left in chair;with call  bell/phone within reach   Nurse Communication Mobility status        Time: 1308-6578 OT Time Calculation (min): 21 min  Charges: OT General Charges $OT Visit: 1 Visit OT Treatments $Self Care/Home Management : 8-22 mins  12/07/2022  RP, OTR/L  Acute Rehabilitation Services  Office:  [phone]   Suzanna Obey 12/07/2022, 8:33 AM

## 2022-12-07 NOTE — Discharge Summary (Addendum)
Family Medicine Teaching [facility] Discharge Summary  Patient name: Diane Freeman Medical record number: [id number] Date of birth: [birth year] Age: 77 y.o. Gender: female Date of Admission: 12/03/2022  Date of Discharge: 12/07/2022 Admitting Physician: Lincoln Brigham, MD  Primary Care Provider: Para March, DO Consultants: Cardiology, EP (complete), OT (complete), PT   Indication for Hospitalization: Pt was admitted for sepsis secondary to a UTI  Discharge Diagnoses/Problem List:  Principal Problem for Admission: Sepsis secondary to UTI Other Problems addressed during stay:  Principal Problem:   Sepsis secondary to UTI [facility]) Active Problems:   Hypoxia   Paroxysmal atrial fibrillation with RVR (HCC)   Hyponatremia   Physical deconditioning   Brief Hospital Course:  Diane Freeman is a 77 y.o.female with a history of Crohn's on mesalamine, paroxysmal A-fib, GERD who was admitted to the [facility] Medicine teaching Service at [facility] for sepsis secondary to UTI. Her hospital course is detailed below:  Major Problems Addressed During Admission:  UTI  Sepsis Patient went to [facility] with 1 day of abdominal pain and nonbloody, nonbilious emesis.  Presented with low diastolic blood pressures to the 40s, febrile, elevated lactate to 4.7 which improved to 3.1, with UA positive for nitrites and leukocytes likely due to sepsis secondary to UTI.  Patient was given IVF.  Blood culture showed no growth after two days.  Patient has previously grown E coli and Group B strep.  Patient was started on ceftriaxone 2 g/day for 2 days with transition to cefadroxil for 3 days. She was discharged with 1 more dose. (Total ABX course of 5 days).  Abdominal Pain Likely secondary to UTI versus other GI pathology given lack of other GI symptoms and absence of sick contacts.  Electrolytes remained stable for her but LFTs were elevated.  Hepatitis panel showed no reactivity (or immunity) to hepatitis  antigens.  Treated patient's pain with Tylenol or Oxy PRN and Zofran as needed for nausea and vomiting. Abdominal pain had resolved by 9/5.  Paroxysmal atrial fibrillation w/ RVR Pt had paroxysmal atrial fibrillation during admission. Patient mentioned that she has had palpitations noticed by a doctor in the past, but that she has never been treated for them. Cardiology and EP consulted. She received 2 doses of IV metoprolol 2.5 mg w/in a few mins of each other.  Rates improved into the 90's, but she also had pauses of up to 7.56 sec pauses earlier this evening, likely assoc w/ presyncope. Metoprolol added as a contraindication for now. Echo showed preserved EF, grossly normal function and anatomy of the heart. She was started on heparin and transitioned to eliquis for anticoagulation. Pt started on amiodarone for rhythm control. Follow-on outpatient with Cardiology in October.  BLE Edema Lower extremity bilateral edema and TTP L>R, warm, not erythematous.  LLE Doppler US showed no signs of recent DVT. Edema resolved by 9/4.  Other chronic conditions were medically managed with home medications and formulary alternatives as necessary: Crohns: Continued home Mesalamine GERD: Continued formulary equivalent of home omeprazole  PCP Follow-up Recommendations: Follow up with A-Fib clinic in 3 months to possibly stop amiodarone. Amio 400 daily x60mon (9/4- 10/09), then 200 daily after follow up with cardiology Hyponatremia during admission, recheck BMP outpatient with patient baseline 131 Mild drop in Hgb in days since starting anti-coag. Recheck H&H.  Disposition: Home with home health   Discharge Condition: Stable, walking with assistance of rolling walker due to deconditioning.   Discharge Exam:  Vitals:   12/07/22 4098  12/07/22 1117  BP: (!) 149/73 (!) 166/72  Pulse:  74  Resp: (!) 24 17  Temp: 98.1 F (36.7 C) (!) 97.5 F (36.4 C)  SpO2:  91%   VITALS: Reviewed. GEN: Healthy  appearing, well-developed, NAD. HEENT    -Head: [place]/AT;    -Eyes: EOMI. No discharge or redness; CV: RRR, no m/r/g. LUNGS: CTAB, no w/r/c. ABD: Soft, NT/ND, NBS. SKIN: Warm, well perfused. Patient has scattered seborrheic keratoses. Skin is very dry.  MSK: Pt has decreased muscle tone, but reports it is due to deconditioning. EXT: No clubbing, cyanosis, or edema. NEURO: Ambulating without rolling walker. No focal deficits.  PSYCH: Good Judgment. AOx3. Normal memory, mood, and affect.  Significant Procedures: N/A.  Significant Labs and Imaging:  Recent Labs  Lab 12/06/22 0211 12/07/22 0344  WBC 6.0 6.4  HGB 9.0* 8.8*  HCT 28.2* 27.8*  PLT 119* 144*   Recent Labs  Lab 12/06/22 0211 12/06/22 1417 12/06/22 1818 12/07/22 0344  NA 127* 124* 125* 127*  K 4.0 3.3* 3.7 3.7  CL 101 96* 95* 95*  CO2 18* 19* 20* 21*  GLUCOSE 95 136* 129* 107*  BUN 8 8 8 8   CREATININE 0.89 0.82 0.86 0.84  CALCIUM 7.8* 7.6* 7.9* 7.9*  MG 1.8  --   --  2.0  ALKPHOS 116  --   --  144*  AST 47*  --   --  51*  ALT 54*  --   --  49*  ALBUMIN 2.5*  --   --  2.5*      Latest Reference Range & Units 12/07/22 08:21  Cortisol - AM 6.7 - 22.6 ug/dL 69.6    Latest Reference Range & Units 12/04/22 21:10  TSH 0.350 - 4.500 uIU/mL 0.553    Latest Reference Range & Units 12/07/22 02:20  Osmolality, Urine 300 - 900 mOsm/kg 668  Potassium Urine mmol/L 39  Sodium, Urine mmol/L 144   ECHOCARDIOGRAM REPORT   12/05/2022 IMPRESSIONS   1. Left ventricular ejection fraction, by estimation, is 55 to 60%. The left ventricle has normal function. The left ventricle has no regional wall motion abnormalities. There is mild left ventricular hypertrophy.  Left ventricular diastolic parameters  are consistent with Grade II diastolic dysfunction (pseudonormalization).   2. Right ventricular systolic function is normal. The right ventricular size is normal. There is moderately elevated pulmonary artery systolic pressure.    3. Left atrial size was mild to moderately dilated.   4. The mitral valve is grossly normal. Trivial mitral valve  regurgitation.   5. Tricuspid valve regurgitation is moderate.   6. The aortic valve is tricuspid. Aortic valve regurgitation is not visualized.   7. The inferior vena cava is normal in size with greater than 50% respiratory variability, suggesting right atrial pressure of 3 mmHg.   9/3 Lower Venous DVT Study  Summary:  RIGHT:  - No evidence of common femoral vein obstruction.  LEFT:  - There is no evidence of deep vein thrombosis in the lower extremity.  - No cystic structure found in the popliteal fossa.    Results/Tests Pending at Time of Discharge: None  Discharge Medications:  Allergies as of 12/07/2022   No Known Allergies      Medication List     TAKE these medications    amiodarone 200 MG tablet Commonly known as: PACERONE Take 2 tablets (400 mg total) by mouth daily. Start taking on: December 08, 2022   cefadroxil 500 MG capsule Commonly known  as: DURICEF Take 2 capsules (1,000 mg total) by mouth for one dose this evening.   Eliquis 5 MG Tabs tablet Generic drug: apixaban Take 1 tablet (5 mg total) by mouth 2 (two) times daily.   FeroSul 325 (65 Fe) MG tablet Generic drug: ferrous sulfate Take 1 tablet (325 mg total) by mouth every other day.   mesalamine 0.375 g 24 hr capsule Commonly known as: APRISO TAKE 4 CAPSULES (1.5 G TOTAL) BY MOUTH DAILY. PLEASE SCHEDULE A FOLLOW UP VISIT FOR FURTHER REFILLS   omeprazole 20 MG capsule Commonly known as: PRILOSEC Take 20 mg by mouth daily.   VITAMIN D-3 PO Take 1 capsule by mouth daily.               Durable Medical Equipment  (From admission, onward)           Start     Ordered   12/06/22 1639  For home use only DME Shower stool  Once        12/06/22 1638            Discharge Instructions: Please refer to Patient Instructions section of EMR for full details.  Patient was  counseled important signs and symptoms that should prompt return to medical care, changes in medications, dietary instructions, activity restrictions, and follow up appointments.   Follow-Up Appointments:  Provider Department Center  12/10/2022 9:45 AM [facility] ANNUAL WELLNESS VISIT [facility]  12/10/2022 2:30 PM Jerre Simon, MD [facility] Family [facility] Center [facility]  01/09/2023 1:55 PM (Arrive by 1:40 PM) [name], PA-C [facility]    Lincoln Brigham, MD 12/07/2022, 5:02 PM PGY-2, [facility] Family Medicine

## 2022-12-08 LAB — CULTURE, BLOOD (ROUTINE X 2)
Culture: NO GROWTH
Culture: NO GROWTH
Special Requests: ADEQUATE
Special Requests: ADEQUATE

## 2022-12-10 ENCOUNTER — Telehealth: Payer: Self-pay

## 2022-12-10 ENCOUNTER — Ambulatory Visit: Payer: Self-pay | Admitting: Student

## 2022-12-10 ENCOUNTER — Ambulatory Visit: Payer: Medicare Other

## 2022-12-10 NOTE — Transitions of Care (Post Inpatient/ED Visit) (Signed)
   12/10/2022  Name: Diane Freeman MRN: [id number] DOB: [birth year]  Today's TOC FU Call Status: Today's TOC FU Call Status:: Successful TOC FU Call Completed TOC FU Call Complete Date: 12/10/22 Patient's Name and Date of Birth confirmed.  Transition Care Management Follow-up Telephone Call Date of Discharge: 12/07/22 Discharge Facility: [facility]) Type of Discharge: Inpatient Admission Primary Inpatient Discharge Diagnosis:: Sepsis Secondary to Urinary Tract Infection How have you been since you were released from the hospital?: Better Any questions or concerns?: No  Items Reviewed: Did you receive and understand the discharge instructions provided?: Yes Medications obtained,verified, and reconciled?: Yes (Medications Reviewed) Any new allergies since your discharge?: No Dietary orders reviewed?: Yes Type of Diet Ordered:: Low Sodium Heart Healthy Do you have support at home?: Yes People in Home: sibling(s) Name of Support/Comfort Primary Source: [name]  Medications Reviewed Today: Medications Reviewed Today     Reviewed by Jodelle Gross, RN (Case Manager) on 12/10/22 at 1539  Med List Status: <None>   Medication Order Taking? Sig Documenting Provider Last Dose Status Informant  amiodarone (PACERONE) 200 MG tablet [id number] Yes Take 2 tablets (400 mg total) by mouth daily. Alfredo Martinez, MD Taking Active   apixaban (ELIQUIS) 5 MG TABS tablet [id number] Yes Take 1 tablet (5 mg total) by mouth 2 (two) times daily. Alfredo Martinez, MD Taking Active   cefadroxil (DURICEF) 500 MG capsule [id number] Yes Take 2 capsules (1,000 mg total) by mouth for one dose this evening. Alfredo Martinez, MD Taking Active   Cholecalciferol (VITAMIN D-3 PO) [id number] Yes Take 1 capsule by mouth daily. [provider] Taking Active Self  ferrous sulfate 325 (65 FE) MG tablet [id number] Yes Take 1 tablet (325 mg total) by mouth every other day. Alfredo Martinez, MD Taking Active    mesalamine (APRISO) 0.375 g 24 hr capsule [id number] Yes TAKE 4 CAPSULES (1.5 G TOTAL) BY MOUTH DAILY. PLEASE SCHEDULE A FOLLOW UP VISIT FOR FURTHER REFILLS Armbruster, Willaim Rayas, MD Taking Active   omeprazole (PRILOSEC) 20 MG capsule [id number] Yes Take 20 mg by mouth daily. [provider] Taking Active Self            Home Care and Equipment/Supplies: Were Home Health Services Ordered?: No Any new equipment or medical supplies ordered?: No  Functional Questionnaire: Do you need assistance with bathing/showering or dressing?: No Do you need assistance with meal preparation?: No Do you need assistance with eating?: No Do you have difficulty maintaining continence: No Do you need assistance with getting out of bed/getting out of a chair/moving?: No Do you have difficulty managing or taking your medications?: No  Follow up appointments reviewed: PCP Follow-up appointment confirmed?: Yes Date of PCP follow-up appointment?: 12/19/22 Follow-up Provider: Dr. Elliot Gurney Specialist [facility] Follow-up appointment confirmed?: NA Do you need transportation to your follow-up appointment?: No Do you understand care options if your condition(s) worsen?: Yes-patient verbalized understanding  SDOH Interventions Today    Flowsheet Row Most Recent Value  SDOH Interventions   Food Insecurity Interventions Intervention Not Indicated  Housing Interventions Intervention Not Indicated  Transportation Interventions Intervention Not Indicated  Utilities Interventions Intervention Not Indicated     Jodelle Gross RN, BSN, CCM [facility] RN Care Coordinator/ Transitions of Care Direct Dial: [phone]  Fax: [phone]

## 2022-12-17 ENCOUNTER — Telehealth: Payer: Self-pay

## 2022-12-17 NOTE — Telephone Encounter (Signed)
Patients sister Patty calls nurse line reporting possible adverse reaction to Amiodarone.  She reports she has been more lethargic lately with little strength.   She denies any dizziness, syncope episodes or SOB.   She does not have a pulse ox.   She has an apt scheduled already for 9/18. Advised at this time we do not have any sooner apts.   Discussed precautions with sister for emergent care in the meantime.

## 2022-12-19 ENCOUNTER — Ambulatory Visit (INDEPENDENT_AMBULATORY_CARE_PROVIDER_SITE_OTHER): Payer: Medicare Other | Admitting: Student

## 2022-12-19 VITALS — BP 167/64 | HR 55 | Wt 145.8 lb

## 2022-12-19 DIAGNOSIS — R531 Weakness: Secondary | ICD-10-CM

## 2022-12-19 DIAGNOSIS — I480 Paroxysmal atrial fibrillation: Secondary | ICD-10-CM | POA: Diagnosis not present

## 2022-12-19 NOTE — Patient Instructions (Addendum)
It was wonderful to see you today. Thank you for allowing me to be a part of your care. Below is a short summary of what we discussed at your visit today:  Due to your fatigue I ordered lab to check your blood count and electrolyte level.  I recommend making sure to try to eat more to increase your daily caloric in take  Also you still have irregular heart beat which could be attributing to your fatigue  I have called your cardiologist group and was able to reschedule your appointment for an earlier date. Info is below:  Diane Freeman  [address] [place], [place] [address] Apt Time: 1:30 pm  (please be there by 1:15)   Follow up with PCP in 1 weeks  If you have any questions or concerns, please do not hesitate to contact us via phone or MyChart message.   Diane Simon, MD [facility]

## 2022-12-19 NOTE — Assessment & Plan Note (Signed)
Rate controlled but still in Afib. Current Medication included Amiodarone 400mg  daily and anticoagulant. Patient complaint with medication. She endorses significant generalized weakness and dyspnea since discharge. Although unclear cause of her weakness at this time suspect medication side effect or arhythmia could be attributing to her symptom. Patient would benefit from earlier follow up appointment with cardiology. Was able to call cardiology clinic and get patient an earlier appointment at the [facility] office. Appreciate their effort in accommodating patient's needs. -Continue medications as prescribed -Emphasized need to follow up with new cardiology appointment

## 2022-12-19 NOTE — Progress Notes (Signed)
    SUBJECTIVE:   CHIEF COMPLAINT / HPI:   73bn year old female opresenting for hospital follow up  Recently admitted for sepsis secondary to UTI Completed Abx course in the hospital Hospitalization was completed by AFIB with RVR and patient was treated with IV metoprolol and eventually transitioned to Amiodarone 400 mg daily.  Patient reports she's been compliant with her medication No fevers, chills or urinary symptoms since discharge But endorses significant generalized weakness and SOB with minimal exertion. Does have occasional lightheadedness but no fall or loss of consciousness   PERTINENT  PMH / PSH: Reviewed   OBJECTIVE:   BP (!) 167/64   Pulse (!) 55   Wt 145 lb 12.8 oz (66.1 kg)   SpO2 100%   BMI 25.83 kg/m    Physical Exam General: Alert, frail appearing, NAD Cardiovascular: Bradycardic with Irregular rhythm. No murmur Respiratory: CTAB, No wheezing or Rales Abdomen: No distension or tenderness Extremities: No edema on extremities   Skin: Warm and dry  ASSESSMENT/PLAN:   Paroxysmal atrial fibrillation with RVR (HCC) Rate controlled but still in Afib. Current Medication included Amiodarone 400mg  daily and anticoagulant. Patient complaint with medication. She endorses significant generalized weakness and dyspnea since discharge. Although unclear cause of her weakness at this time suspect medication side effect or arhythmia could be attributing to her symptom. Patient would benefit from earlier follow up appointment with cardiology. Was able to call cardiology clinic and get patient an earlier appointment at the [facility] office. Appreciate their effort in accommodating patient's needs. -Continue medications as prescribed -Emphasized need to follow up with new cardiology appointment    Generalized weakness Unclear etiology at this time. Considered in the differentials included Anemia, medication side effect from Amiodarone, deconditioning, Arrhythmia or viral  illness. Thyroid causes considered but less likely given normal TSH about 2 weeks ago. No signs of active bleeding on AC. Will obtain CBC and BMP. -ordered CBC, BMP -Discussed fall precautions -Return precautions given  -Called Cardiology office to get patient an earlier appointment -Follow up in 1 week  Jerre Simon, MD [facility]

## 2022-12-19 NOTE — Progress Notes (Unsigned)
Cardiology Office Note Date:  12/19/2022  Patient ID:  Diane Freeman, Diane Freeman [birth year], MRN [id number] PCP:  Para March, DO  Cardiologist:  None Electrophysiologist: Will Jorja Loa, MD  ***refresh   Chief Complaint: ***  History of Present Illness: Diane Freeman is a 77 y.o. female with PMH notable for traumatic SDH, Crohn's, GERD, HTN, HLD CKD -3, parox afib; seen today for Will Jorja Loa, MD for post hospital follow up.    Admitted 9/2-09/2022 w sepsis d/t UTI. She developed Afib w RVR treated with IV metop x 2 > ~8 second pause. EP consulted in hospital for concerns for tachy-brady. OAC started. Started on amiodarone to prevent afib and thus prevent pauses. Amio 400mg  daily until outpatient appt.   then 200mg  daily   On follow-up today  *** AF burden, symptoms, syncope *** palpitations *** bleeding concerns  *** how taking amiodarone   Since discharge from hospital the patient reports doing ***.  she denies chest pain, palpitations, dyspnea, PND, orthopnea, nausea, vomiting, dizziness, syncope, edema, weight gain, or early satiety.     Device Information: ***  AAD History: Amiodarone   Past Medical History:  Diagnosis Date   Abdominal pain 12/03/2022   Adrenal nodule (HCC)    Anemia    Blood transfusion without reported diagnosis    Cataract    bil removed   Choledocholithiasis with obstruction    Colitis    Crohn's colitis (HCC) 07/11/2012   Diastolic dysfunction    a. 05/2013 Echo: EF 60-65%, no rwma, GrI DD, mild-mod TR.   Diverticulosis    Fatty liver    GERD (gastroesophageal reflux disease)    Hiatal hernia    History of esophageal stricture    History of left heart catheterization    a. 05/2013 Cath: Nl cors. Nl R heart filling pressures.  No L -> R shunt.  EF 65%.   Hx of gastritis    Lower extremity edema 12/03/2022   Palpitations    a. 09/2014 Zio: Rare PACs/PVCs.   Pyelonephritis 05/29/2022   Right shoulder pain 12/03/2022    Sepsis (HCC) 12/03/2022   UTI (urinary tract infection) 05/14/2013    Past Surgical History:  Procedure Laterality Date   CARDIAC CATHETERIZATION  05/2013   CATARACT EXTRACTION Bilateral    CHOLECYSTECTOMY  2009   COLONOSCOPY  last 09/03/2017, 2020   ERCP  06/02/2011   Procedure: ENDOSCOPIC RETROGRADE CHOLANGIOPANCREATOGRAPHY (ERCP);  Surgeon: Iva Boop, MD;  Location: [facility];  Service: Endoscopy;  Laterality: N/A;   LEFT AND RIGHT HEART CATHETERIZATION WITH CORONARY ANGIOGRAM N/A 06/15/2013   Procedure: LEFT AND RIGHT HEART CATHETERIZATION WITH CORONARY ANGIOGRAM- Shunt series;  Surgeon: Micheline Chapman, MD;  Location: [facility] CATH LAB;  Service: Cardiovascular;  Laterality: N/A;   TUBAL LIGATION     UPPER GASTROINTESTINAL ENDOSCOPY      Current Outpatient Medications  Medication Instructions   amiodarone (PACERONE) 400 mg, Oral, Daily   cefadroxil (DURICEF) 500 MG capsule Take 2 capsules (1,000 mg total) by mouth for one dose this evening.   Cholecalciferol (VITAMIN D-3 PO) 1 capsule, Oral, Daily   Eliquis 5 mg, Oral, 2 times daily   FeroSul 325 mg, Oral, Every other day   mesalamine (APRISO) 1.5 g, Oral, Daily, Please schedule a follow up visit for further refills   omeprazole (PRILOSEC) 20 mg, Oral, Daily    Social History:  The patient  reports that she has never smoked. She has never used smokeless tobacco. She  reports that she does not drink alcohol and does not use drugs.   Family History:  *** include only if pertinent The patient's family history includes Bone cancer in her maternal grandmother; Breast cancer in her sister; Colon cancer (age of onset: 29) in her maternal aunt; Stomach cancer (age of onset: 19) in her sister.***  ROS:  Please see the history of present illness. All other systems are reviewed and otherwise negative.   PHYSICAL EXAM: *** VS:  There were no vitals taken for this visit. BMI: There is no height or weight on file to calculate BMI.  GEN-  The patient is well appearing, alert and oriented x 3 today.   Lungs- Clear to ausculation bilaterally, normal work of breathing.  Heart- {Blank single:19197::"Regular","Irregularly irregular"} rate and rhythm, no murmurs, rubs or gallops Extremities- {EDEMA LEVEL:28147::"No"} peripheral edema, warm, dry   EKG is ordered. Personal review of EKG from today shows:  ***        Recent Labs: 05/29/2022: B Natriuretic Peptide 59.0 12/04/2022: TSH 0.553 12/07/2022: ALT 49; BUN 8; Creatinine, Ser 0.84; Hemoglobin 8.8; Magnesium 2.0; Platelets 144; Potassium 3.7; Sodium 127  No results found for requested labs within last 365 days.   Estimated Creatinine Clearance: 50.5 mL/min (by C-G formula based on SCr of 0.84 mg/dL).   Wt Readings from Last 3 Encounters:  12/19/22 145 lb 12.8 oz (66.1 kg)  12/03/22 151 lb 14.4 oz (68.9 kg)  06/05/22 149 lb (67.6 kg)     Additional studies reviewed include: Previous EP, cardiology notes.   TTE, 12/05/2022  1. Left ventricular ejection fraction, by estimation, is 55 to 60%. The left ventricle has normal function. The left ventricle has no regional wall motion abnormalities. There is mild left ventricular hypertrophy. Left ventricular diastolic parameters are consistent with Grade II diastolic dysfunction (pseudonormalization).   2. Right ventricular systolic function is normal. The right ventricular size is normal. There is moderately elevated pulmonary artery systolic pressure.   3. Left atrial size was mild to moderately dilated.   4. The mitral valve is grossly normal. Trivial mitral valve regurgitation.   5. Tricuspid valve regurgitation is moderate.   6. The aortic valve is tricuspid. Aortic valve regurgitation is not visualized.   7. The inferior vena cava is normal in size with greater than 50% respiratory variability, suggesting right atrial pressure of 3 mmHg.   Holter monitor, 09/15/2014 Normal sinus rhythm with first degree AV block Rare PVC and  PACs Non significant pauses.  ASSESSMENT AND PLAN:  #) Parox Afib #) sinus pauses Afib in setting of sepsis from UTI   #) Hypercoag d/t paros afib CHA2DS2-VASc Score = 4 [CHF History: 0, HTN History: 0, Diabetes History: 0, Stroke History: 0, Vascular Disease History: 1, Age Score: 2, Gender Score: 1].  Therefore, the patient's annual risk of stroke is 4.8 %.     {Confirm score is correct.  If not, click here to update score.  REFRESH note.  :1}   Stroke ppx - 5mg  eliquis BID, appropriately dosed No bleeding concerns    #) ***   {Are you ordering a CV Procedure (e.g. stress test, cath, DCCV, TEE, etc)?   Press F2        :[id number]}   Current medicines are reviewed at length with the patient today.   The patient {ACTIONS; HAS/DOES NOT HAVE:19233} concerns regarding her medicines.  The following changes were made today:  {NONE DEFAULTED:18576}  Labs/ tests ordered today include: *** No orders of  the defined types were placed in this encounter.    Disposition: Follow up with {EPMDS:28135} or EP APP {EPFOLLOW UP:28173}   Signed, Sherie Don, NP  12/19/22  6:45 PM  Electrophysiology [facility]

## 2022-12-20 ENCOUNTER — Encounter: Payer: Self-pay | Admitting: Cardiology

## 2022-12-20 ENCOUNTER — Ambulatory Visit: Payer: Medicare Other | Attending: Physician Assistant | Admitting: Cardiology

## 2022-12-20 VITALS — BP 140/78 | HR 52 | Ht 63.0 in | Wt 142.4 lb

## 2022-12-20 DIAGNOSIS — I480 Paroxysmal atrial fibrillation: Secondary | ICD-10-CM | POA: Insufficient documentation

## 2022-12-20 DIAGNOSIS — I455 Other specified heart block: Secondary | ICD-10-CM | POA: Diagnosis not present

## 2022-12-20 DIAGNOSIS — D6869 Other thrombophilia: Secondary | ICD-10-CM | POA: Diagnosis not present

## 2022-12-20 LAB — CBC
Hematocrit: 34.1 % (ref 34.0–46.6)
Hemoglobin: 10.6 g/dL — ABNORMAL LOW (ref 11.1–15.9)
MCH: 25.2 pg — ABNORMAL LOW (ref 26.6–33.0)
MCHC: 31.1 g/dL — ABNORMAL LOW (ref 31.5–35.7)
MCV: 81 fL (ref 79–97)
Platelets: 216 10*3/uL (ref 150–450)
RBC: 4.21 x10E6/uL (ref 3.77–5.28)
RDW: 17.7 % — ABNORMAL HIGH (ref 11.7–15.4)
WBC: 5.9 10*3/uL (ref 3.4–10.8)

## 2022-12-20 LAB — BASIC METABOLIC PANEL
BUN/Creatinine Ratio: 9 — ABNORMAL LOW (ref 12–28)
BUN: 10 mg/dL (ref 8–27)
CO2: 23 mmol/L (ref 20–29)
Calcium: 8.7 mg/dL (ref 8.7–10.3)
Chloride: 102 mmol/L (ref 96–106)
Creatinine, Ser: 1.1 mg/dL — ABNORMAL HIGH (ref 0.57–1.00)
Glucose: 97 mg/dL (ref 70–99)
Potassium: 3.9 mmol/L (ref 3.5–5.2)
Sodium: 137 mmol/L (ref 134–144)
eGFR: 51 mL/min/{1.73_m2} — ABNORMAL LOW (ref >59)

## 2022-12-20 MED ORDER — AMIODARONE HCL 200 MG PO TABS: 200.0000 mg | ORAL_TABLET | Freq: Every day | ORAL | 2 refills | Status: DC

## 2022-12-20 NOTE — Patient Instructions (Signed)
Medication Instructions:  Decrease Amiodarone to 200 mg daily   *If you need a refill on your cardiac medications before your next appointment, please call your pharmacy*   Lab Work: Your provider would like for you to have following labs drawn today TSH, Free T4, LFT .   If you have labs (blood work) drawn today and your tests are completely normal, you will receive your results only by: MyChart Message (if you have MyChart) OR A paper copy in the mail If you have any lab test that is abnormal or we need to change your treatment, we will call you to review the results.   Follow-Up: At [facility], you and your health needs are our priority.  As part of our continuing mission to provide you with exceptional heart care, we have created designated Provider Care Teams.  These Care Teams include your primary Cardiologist (physician) and Advanced Practice Providers (APPs -  Physician Assistants and Nurse Practitioners) who all work together to provide you with the care you need, when you need it.  We recommend signing up for the patient portal called "MyChart".  Sign up information is provided on this After Visit Summary.  MyChart is used to connect with patients for Virtual Visits (Telemedicine).  Patients are able to view lab/test results, encounter notes, upcoming appointments, etc.  Non-urgent messages can be sent to your provider as well.   To learn more about what you can do with MyChart, go to [email or web address].    Your next appointment:   4-6 week(s)  Provider:   Francis Dowse, PA-C

## 2022-12-21 LAB — T4, FREE: Free T4: 1.67 ng/dL (ref 0.82–1.77)

## 2022-12-21 LAB — HEPATIC FUNCTION PANEL
ALT: 22 IU/L (ref 0–32)
AST: 33 IU/L (ref 0–40)
Albumin: 3.6 g/dL — ABNORMAL LOW (ref 3.8–4.8)
Alkaline Phosphatase: 124 IU/L — ABNORMAL HIGH (ref 44–121)
Bilirubin Total: 0.6 mg/dL (ref 0.0–1.2)
Bilirubin, Direct: 0.33 mg/dL (ref 0.00–0.40)
Total Protein: 6.9 g/dL (ref 6.0–8.5)

## 2022-12-21 LAB — TSH: TSH: 2.5 u[IU]/mL (ref 0.450–4.500)

## 2022-12-28 ENCOUNTER — Encounter: Payer: Self-pay | Admitting: Student

## 2022-12-28 ENCOUNTER — Ambulatory Visit (INDEPENDENT_AMBULATORY_CARE_PROVIDER_SITE_OTHER): Payer: Medicare Other | Admitting: Student

## 2022-12-28 ENCOUNTER — Other Ambulatory Visit: Payer: Self-pay

## 2022-12-28 VITALS — BP 134/52 | HR 64 | Ht 63.0 in | Wt 145.0 lb

## 2022-12-28 DIAGNOSIS — R5383 Other fatigue: Secondary | ICD-10-CM

## 2022-12-28 MED ORDER — AMIODARONE HCL 200 MG PO TABS: 200.0000 mg | ORAL_TABLET | ORAL | Status: DC

## 2022-12-28 MED ORDER — FERROUS SULFATE 325 (65 FE) MG PO TABS: 325.0000 mg | ORAL_TABLET | ORAL | 0 refills | Status: DC

## 2022-12-28 NOTE — Progress Notes (Unsigned)
    SUBJECTIVE:   CHIEF COMPLAINT / HPI:   Recent admission for Sepsis urinary source. Generalized weakness.   PERTINENT  PMH / PSH: Chron's on mesalamine, IDA on iron, AF on   OBJECTIVE:   BP (!) 182/58   Pulse 66   Ht 5\' 3"  (1.6 m)   Wt 145 lb (65.8 kg)   SpO2 99%   BMI 25.69 kg/m   ***  ASSESSMENT/PLAN:   No problem-specific Assessment & Plan notes found for this encounter.     Eliezer Mccoy, MD [facility]

## 2022-12-30 DIAGNOSIS — R5383 Other fatigue: Secondary | ICD-10-CM | POA: Insufficient documentation

## 2022-12-30 NOTE — Assessment & Plan Note (Addendum)
Fatigued since leaving the hospital. Certainly there's a component of deconditioning from her acute illness and hospitalization, but I do not think this fully explains her symptoms, nor does it explain the "bump" in energy she got when moving from Amio 400mg  daily to 200mg  daily. I suspect this is drug related and from her amiodarone. I do not believe she needs to be on amiodarone long term as it seems her A Fib was uncovered only in the setting of acute illness and she has seemed to maintain SR since leaving the hospital (albeit on amio).  - She will continue Eliquis, of course, but I think moving amiodarone to every other day is wise with the hope of eventually coming off altogether---provided cardiology gives their blessing here.  - Amiodarone 200mg  every other day  - Continue every other day iron supplementation

## 2022-12-30 NOTE — Patient Instructions (Incomplete)
Diane Freeman , Thank you for taking time to come for your Medicare Wellness Visit. I appreciate your ongoing commitment to your health goals. Please review the following plan we discussed and let me know if I can assist you in the future.   Referrals/Orders/Follow-Ups/Clinician Recommendations: Aim for 30 minutes of exercise or brisk walking, 6-8 glasses of water, and 5 servings of fruits and vegetables each day.  This is a list of the screening recommended for you and due dates:  Health Maintenance  Topic Date Due   Zoster (Shingles) Vaccine (1 of 2) Never done   Colon Cancer Screening  08/16/2021   Flu Shot  07/01/2023*   Medicare Annual Wellness Visit  12/31/2023   DTaP/Tdap/Td vaccine (3 - Td or Tdap) 07/13/2031   Pneumonia Vaccine  Completed   DEXA scan (bone density measurement)  Completed   Hepatitis C Screening  Completed   HPV Vaccine  Aged Out   COVID-19 Vaccine  Discontinued  *Topic was postponed. The date shown is not the original due date.    Advanced directives: (ACP Link)Information on Advanced Care Planning can be found at [facility] Advance Health Care Directives Advance Health Care Directives ([email or web address]   Next Medicare Annual Wellness Visit scheduled for next year: Yes

## 2022-12-31 ENCOUNTER — Ambulatory Visit: Payer: Medicare Other

## 2022-12-31 VITALS — Ht 63.0 in | Wt 142.0 lb

## 2022-12-31 DIAGNOSIS — Z0000 Encounter for general adult medical examination without abnormal findings: Secondary | ICD-10-CM

## 2022-12-31 NOTE — Progress Notes (Signed)
Subjective:   Diane Freeman is a 77 y.o. female who presents for an Initial Medicare Annual Wellness Visit.  Visit Complete: Virtual  I connected with  Diane Freeman on 12/31/22 by a audio enabled telemedicine application and verified that I am speaking with the correct person using two identifiers.  Patient Location: Home  Provider Location: Home Office  I discussed the limitations of evaluation and management by telemedicine. The patient expressed understanding and agreed to proceed.  Because this visit was a virtual/telehealth visit, some criteria may be missing or patient reported. Any vitals not documented were not able to be obtained and vitals that have been documented are patient reported.   Cardiac Risk Factors include: advanced age (>42men, >63 women);dyslipidemia     Objective:    Today's Vitals   12/31/22 1017  Weight: 142 lb (64.4 kg)  Height: 5\' 3"  (1.6 m)   Body mass index is 25.15 kg/m.     12/31/2022   10:21 AM 12/03/2022   10:08 AM 12/03/2022    2:32 AM 06/05/2022    3:16 PM 05/30/2022    3:13 AM 07/12/2021    1:55 PM 02/17/2018    1:53 PM  Advanced Directives  Does Patient Have a Medical Advance Directive? No No No No No No No  Would patient like information on creating a medical advance directive? Yes (MAU/Ambulatory/Procedural Areas - Information given) No - Patient declined  No - Patient declined No - Patient declined No - Patient declined No - Patient declined    Current Medications (verified) Outpatient Encounter Medications as of 12/31/2022  Medication Sig   amiodarone (PACERONE) 200 MG tablet Take 1 tablet (200 mg total) by mouth every other day.   apixaban (ELIQUIS) 5 MG TABS tablet Take 1 tablet (5 mg total) by mouth 2 (two) times daily.   Cholecalciferol (VITAMIN D-3 PO) Take 1 capsule by mouth daily.   ferrous sulfate 325 (65 FE) MG tablet Take 1 tablet (325 mg total) by mouth every other day.   mesalamine (APRISO) 0.375 g 24 hr capsule  TAKE 4 CAPSULES (1.5 G TOTAL) BY MOUTH DAILY. PLEASE SCHEDULE A FOLLOW UP VISIT FOR FURTHER REFILLS   omeprazole (PRILOSEC) 20 MG capsule Take 20 mg by mouth daily.   No facility-administered encounter medications on file as of 12/31/2022.    Allergies (verified) Patient has no known allergies.   History: Past Medical History:  Diagnosis Date   Abdominal pain 12/03/2022   Adrenal nodule (HCC)    Anemia    Blood transfusion without reported diagnosis    Cataract    bil removed   Choledocholithiasis with obstruction    Colitis    Crohn's colitis (HCC) 07/11/2012   Diastolic dysfunction    a. 05/2013 Echo: EF 60-65%, no rwma, GrI DD, mild-mod TR.   Diverticulosis    Fatty liver    GERD (gastroesophageal reflux disease)    Hiatal hernia    History of esophageal stricture    History of left heart catheterization    a. 05/2013 Cath: Nl cors. Nl R heart filling pressures.  No L -> R shunt.  EF 65%.   Hx of gastritis    Lower extremity edema 12/03/2022   Palpitations    a. 09/2014 Zio: Rare PACs/PVCs.   Pyelonephritis 05/29/2022   Right shoulder pain 12/03/2022   Sepsis (HCC) 12/03/2022   UTI (urinary tract infection) 05/14/2013   Past Surgical History:  Procedure Laterality Date   CARDIAC CATHETERIZATION  05/2013  CATARACT EXTRACTION Bilateral    CHOLECYSTECTOMY  2009   COLONOSCOPY  last 09/03/2017, 2020   ERCP  06/02/2011   Procedure: ENDOSCOPIC RETROGRADE CHOLANGIOPANCREATOGRAPHY (ERCP);  Surgeon: Iva Boop, MD;  Location: [facility];  Service: Endoscopy;  Laterality: N/A;   LEFT AND RIGHT HEART CATHETERIZATION WITH CORONARY ANGIOGRAM N/A 06/15/2013   Procedure: LEFT AND RIGHT HEART CATHETERIZATION WITH CORONARY ANGIOGRAM- Shunt series;  Surgeon: Micheline Chapman, MD;  Location: [facility] CATH LAB;  Service: Cardiovascular;  Laterality: N/A;   TUBAL LIGATION     UPPER GASTROINTESTINAL ENDOSCOPY     Family History  Problem Relation Age of Onset   Bone cancer Maternal  Grandmother    Colon cancer Maternal Aunt 3   Stomach cancer Sister 33       decsd   Breast cancer Sister    Esophageal cancer Neg Hx    Rectal cancer Neg Hx    Pancreatic cancer Neg Hx    Colon polyps Neg Hx    Social History   Socioeconomic History   Marital status: Divorced    Spouse name: Not on file   Number of children: 2   Years of education: Not on file   Highest education level: Not on file  Occupational History   Occupation: retired  Tobacco Use   Smoking status: Never   Smokeless tobacco: Never  Vaping Use   Vaping status: Never Used  Substance and Sexual Activity   Alcohol use: No    Alcohol/week: 0.0 standard drinks of alcohol   Drug use: No   Sexual activity: Not on file  Other Topics Concern   Not on file  Social History Narrative   Not on file   Social Determinants of Health   Financial Resource Strain: Low Risk  (12/31/2022)   Overall Financial Resource Strain (CARDIA)    Difficulty of Paying Living Expenses: Not hard at all  Food Insecurity: No Food Insecurity (12/31/2022)   Hunger Vital Sign    Worried About Running Out of Food in the Last Year: Never true    Ran Out of Food in the Last Year: Never true  Transportation Needs: No Transportation Needs (12/31/2022)   PRAPARE - Administrator, Civil Service (Medical): No    Lack of Transportation (Non-Medical): No  Physical Activity: Insufficiently Active (12/31/2022)   Exercise Vital Sign    Days of Exercise per Week: 3 days    Minutes of Exercise per Session: 30 min  Stress: No Stress Concern Present (12/31/2022)   [employer] of Occupational Health - Occupational Stress Questionnaire    Feeling of Stress : Not at all  Social Connections: Moderately Isolated (12/31/2022)   Social Connection and Isolation Panel [NHANES]    Frequency of Communication with Friends and Family: More than three times a week    Frequency of Social Gatherings with Friends and Family: Three times a week     Attends Religious Services: More than 4 times per year    Active Member of Clubs or Organizations: No    Attends Banker Meetings: Never    Marital Status: Divorced    Tobacco Counseling Counseling given: Not Answered   Clinical Intake:  Pre-visit preparation completed: Yes  Pain : No/denies pain   Diabetes: No  How often do you need to have someone help you when you read instructions, pamphlets, or other written materials from your doctor or pharmacy?: 1 - Never  Interpreter Needed?: No  Information entered by ::  Kandis Fantasia LPN   Activities of Daily Living    12/31/2022   10:21 AM 12/03/2022   10:08 AM  In your present state of health, do you have any difficulty performing the following activities:  Hearing? 0 0  Vision? 0 0  Difficulty concentrating or making decisions? 0 0  Walking or climbing stairs? 0 0  Dressing or bathing? 0 0  Doing errands, shopping? 0 1  Preparing Food and eating ? N   Using the Toilet? N   In the past six months, have you accidently leaked urine? N   Do you have problems with loss of bowel control? N   Managing your Medications? N   Managing your Finances? N   Housekeeping or managing your Housekeeping? N     Patient Care Team: Para March, DO as PCP - General (Family Medicine) Regan Lemming, MD as PCP - Electrophysiology (Cardiology) Hart Carwin, MD (Inactive) as Consulting Physician (Gastroenterology)  Indicate any recent Medical Services you may have received from other than [facility] providers in the past year (date may be approximate).     Assessment:   This is a routine wellness examination for Diane Freeman.  Hearing/Vision screen Hearing Screening - Comments:: Denies hearing difficulties   Vision Screening - Comments:: No vision problems; will schedule routine eye exam soon     Goals Addressed             This Visit's Progress    Remain active and independent         Depression Screen     12/31/2022   10:20 AM 12/28/2022    2:45 PM 06/05/2022    3:18 PM 07/12/2021    1:55 PM 02/17/2018    1:52 PM 01/21/2018    2:45 PM 12/17/2016    1:42 PM  PHQ 2/9 Scores  PHQ - 2 Score 0 0 0 0 0 0 0  PHQ- 9 Score 4 4 0 2       Fall Risk    12/31/2022   10:21 AM 12/28/2022    2:44 PM 06/05/2022    3:18 PM 02/17/2018    1:52 PM 01/21/2018    2:45 PM  Fall Risk   Falls in the past year? 0 0 0 0 No  Number falls in past yr: 0 0 0 0   Injury with Fall? 0  0 0   Risk for fall due to : No Fall Risks      Follow up Falls prevention discussed;Education provided;Falls evaluation completed        MEDICARE RISK AT HOME: Medicare Risk at Home Any stairs in or around the home?: No If so, are there any without handrails?: No Home free of loose throw rugs in walkways, pet beds, electrical cords, etc?: Yes Adequate lighting in your home to reduce risk of falls?: Yes Life alert?: No Use of a cane, walker or w/c?: No Grab bars in the bathroom?: Yes Shower chair or bench in shower?: Yes Elevated toilet seat or a handicapped toilet?: No  TIMED UP AND GO:  Was the test performed? No    Cognitive Function:        12/31/2022   10:21 AM  6CIT Screen  What Year? 0 points  What month? 0 points  What time? 0 points  Count back from 20 0 points  Months in reverse 0 points  Repeat phrase 0 points  Total Score 0 points    Immunizations Immunization History  Administered Date(s) Administered  Pneumococcal Conjugate-13 08/29/2016   Pneumococcal Polysaccharide-23 05/29/2011   Tdap 05/29/2011, 07/12/2021    TDAP status: Up to date  Flu Vaccine status: Declined, Education has been provided regarding the importance of this vaccine but patient still declined. Advised may receive this vaccine at local pharmacy or Health Dept. Aware to provide a copy of the vaccination record if obtained from local pharmacy or Health Dept. Verbalized acceptance and understanding.  Pneumococcal vaccine status:  Up to date  Covid-19 vaccine status: Information provided on how to obtain vaccines.   Qualifies for Shingles Vaccine? Yes   Zostavax completed No   Shingrix Completed?: No.    Education has been provided regarding the importance of this vaccine. Patient has been advised to call insurance company to determine out of pocket expense if they have not yet received this vaccine. Advised may also receive vaccine at local pharmacy or Health Dept. Verbalized acceptance and understanding.  Screening Tests Health Maintenance  Topic Date Due   Zoster Vaccines- Shingrix (1 of 2) Never done   Colonoscopy  08/16/2021   INFLUENZA VACCINE  07/01/2023 (Originally 11/01/2022)   Medicare Annual Wellness (AWV)  12/31/2023   DTaP/Tdap/Td (3 - Td or Tdap) 07/13/2031   Pneumonia Vaccine 21+ Years old  Completed   DEXA SCAN  Completed   Hepatitis C Screening  Completed   HPV VACCINES  Aged Out   COVID-19 Vaccine  Discontinued    Health Maintenance  Health Maintenance Due  Topic Date Due   Zoster Vaccines- Shingrix (1 of 2) Never done   Colonoscopy  08/16/2021    Colorectal cancer screening: No longer required.   Mammogram status: No longer required due to age and preference.  Bone Density status: Completed 07/14/09. Results reflect: Bone density results: OSTEOPENIA. Repeat every 2 years.  Lung Cancer Screening: (Low Dose CT Chest recommended if Age 8-80 years, 20 pack-year currently smoking OR have quit w/in 15years.) does not qualify.   Lung Cancer Screening Referral: n/a  Additional Screening:  Hepatitis C Screening: does qualify; Completed 12/04/22  Vision Screening: Recommended annual ophthalmology exams for early detection of glaucoma and other disorders of the eye. Is the patient up to date with their annual eye exam?  No  Who is the provider or what is the name of the office in which the patient attends annual eye exams? none If pt is not established with a provider, would they like to be  referred to a provider to establish care? No .   Dental Screening: Recommended annual dental exams for proper oral hygiene  Community Resource Referral / Chronic Care Management: CRR required this visit?  No   CCM required this visit?  No     Plan:     I have personally reviewed and noted the following in the patient's chart:   Medical and social history Use of alcohol, tobacco or illicit drugs  Current medications and supplements including opioid prescriptions. Patient is not currently taking opioid prescriptions. Functional ability and status Nutritional status Physical activity Advanced directives List of other physicians Hospitalizations, surgeries, and ER visits in previous 12 months Vitals Screenings to include cognitive, depression, and falls Referrals and appointments  In addition, I have reviewed and discussed with patient certain preventive protocols, quality metrics, and best practice recommendations. A written personalized care plan for preventive services as well as general preventive health recommendations were provided to patient.     Kandis Fantasia [place], [place]   07/09/8117   After Visit Summary: (Mail) Due to  this being a telephonic visit, the after visit summary with patients personalized plan was offered to patient via mail   Nurse Notes: No concerns at this time

## 2023-01-03 ENCOUNTER — Other Ambulatory Visit: Payer: Self-pay | Admitting: Gastroenterology

## 2023-01-03 ENCOUNTER — Other Ambulatory Visit: Payer: Self-pay | Admitting: Student

## 2023-01-09 ENCOUNTER — Ambulatory Visit: Payer: Medicare Other | Admitting: Physician Assistant

## 2023-01-27 ENCOUNTER — Other Ambulatory Visit: Payer: Self-pay | Admitting: Gastroenterology

## 2023-01-27 ENCOUNTER — Other Ambulatory Visit: Payer: Self-pay | Admitting: Family Medicine

## 2023-01-28 ENCOUNTER — Other Ambulatory Visit: Payer: Self-pay | Admitting: Family Medicine

## 2023-01-28 MED ORDER — FERROUS SULFATE 325 (65 FE) MG PO TABS: 325.0000 mg | ORAL_TABLET | ORAL | 1 refills | Status: DC

## 2023-01-30 ENCOUNTER — Other Ambulatory Visit: Payer: Self-pay | Admitting: Gastroenterology

## 2023-02-03 NOTE — Progress Notes (Unsigned)
Cardiology Office Note:  .   Date:  02/03/2023  ID:  [name], DOB [birth year], MRN [id number] PCP: Para March, DO  [facility] Providers Cardiologist:  None Electrophysiologist:  Will Jorja Loa, MD {  History of Present Illness: .   Diane Freeman is a 77 y.o. female w/PMHx of traumatic SDH 2015, Chron's, GIB, diverticulosis, GERD, esophageal stricture, HTN, HLD, CKD (III), remote cath (2015) with no obstructive CAD   Admitted 12/03/22,  abdominal discomfort, vomiting, and urinary complaints, found with low grade temp (met sepsis criteria with fever and elevated lactic acid, UTI), admitted and started on ABX, IVF, LLE edema evaluted and neg for DVT UTI.  She developed rapid AFib treated with IV metoprolol > pause and cardiology consulted EP brought on board  New onset AFib with a number of pauses after IV BB, longest 9.2 seconds.  in setting of UTI/sepsis, 101 temp (on arrival) No hx of near syncope or syncope, recommended amiodarone to help maintain SR with rec to f/u in a few months out patient with plans to stop amiodarone then  She saw S. Riddle, NP 12/20/22, doing well, gaining strength back, saw her PMD who by auscultation felt she was in AFib (no EKG) She was in SR at this visit, amiodarone reduced to 200mg  daily with plans to follow up again in 4-6 weeks   Today's visit is scheduled as a 4-6 week visit  ROS: ***  *** symptoms *** burden *** stop amio perhaps another couple months? *** amio labs UTD and OK *** eliquis, bleeding, dose  Arrhythmia/AAD hx Found 12/2022 during hospitalization with sepsis Amiodarone started 12/2022  Studies Reviewed: Marland Kitchen    EKG not done today   TTE, 12/05/2022  1. Left ventricular ejection fraction, by estimation, is 55 to 60%. The left ventricle has normal function. The left ventricle has no regional wall motion abnormalities. There is mild left ventricular hypertrophy. Left ventricular diastolic parameters are  consistent with Grade II diastolic dysfunction (pseudonormalization).   2. Right ventricular systolic function is normal. The right ventricular size is normal. There is moderately elevated pulmonary artery systolic pressure.   3. Left atrial size was mild to moderately dilated.   4. The mitral valve is grossly normal. Trivial mitral valve regurgitation.   5. Tricuspid valve regurgitation is moderate.   6. The aortic valve is tricuspid. Aortic valve regurgitation is not visualized.   7. The inferior vena cava is normal in size with greater than 50% respiratory variability, suggesting right atrial pressure of 3 mmHg.    06/13/2013 TTE Study Conclusions  - Left ventricle: The cavity size was normal. Wall thickness    was normal. Systolic function was normal. The estimated    ejection fraction was in the range of 60% to 65%. Wall    motion was normal; there were no regional wall motion    abnormalities. Doppler parameters are consistent with    abnormal left ventricular relaxation (grade 1 diastolic    dysfunction).  - Tricuspid valve: Mild-moderate regurgitation.  - Pulmonary arteries: PA peak pressure: 32mm Hg (S).  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 ? If ASD PFO   06/15/2013: R/LHC Final Conclusions:   1. Patent coronary arteries with minimal nonobstructive CAD 2. Normal intracardiac hemodynamics 3. Normal LV function 4. No evidence of left-to-right intracardiac shunt   Risk Assessment/Calculations:  Physical Exam:   VS:  There were no vitals taken for this visit.   Wt Readings from Last 3 Encounters:  12/31/22 142 lb (64.4 kg)  12/28/22 145 lb (65.8 kg)  12/20/22 142 lb 6.4 oz (64.6 kg)    GEN: Well nourished, well developed in no acute distress NECK: No JVD; No carotid bruits CARDIAC: ***RRR, no murmurs, rubs, gallops RESPIRATORY:  *** CTA b/l without rales, wheezing or  rhonchi  ABDOMEN: Soft, non-tender, non-distended EXTREMITIES:  No edema; No deformity    ASSESSMENT AND PLAN: .    paroxysmal AFib CHA2DS2Vasc is 3, on Eliquis, *** appropriately dosed Amiodarone *** burden by symptoms  HTN ***  Secondary hypercoagulable state 2/2 AFib     {Are you ordering a CV Procedure (e.g. stress test, cath, DCCV, TEE, etc)?   Press F2        :[id number]}     Dispo: ***  Signed, [name], PA-C

## 2023-02-05 ENCOUNTER — Encounter: Payer: Self-pay | Admitting: Physician Assistant

## 2023-02-05 ENCOUNTER — Ambulatory Visit: Payer: Medicare Other | Attending: Physician Assistant | Admitting: Physician Assistant

## 2023-02-05 VITALS — BP 130/80 | HR 55 | Ht 63.0 in | Wt 139.8 lb

## 2023-02-05 DIAGNOSIS — D6869 Other thrombophilia: Secondary | ICD-10-CM | POA: Diagnosis not present

## 2023-02-05 DIAGNOSIS — I10 Essential (primary) hypertension: Secondary | ICD-10-CM | POA: Diagnosis not present

## 2023-02-05 DIAGNOSIS — I480 Paroxysmal atrial fibrillation: Secondary | ICD-10-CM | POA: Diagnosis not present

## 2023-02-05 NOTE — Patient Instructions (Signed)
Medication Instructions:  STOP Amiodarone *If you need a refill on your cardiac medications before your next appointment, please call your pharmacy*   Lab Work: None ordered   Testing/Procedures: None Ordered   Follow-Up: At [facility], you and your health needs are our priority.  As part of our continuing mission to provide you with exceptional heart care, we have created designated Provider Care Teams.  These Care Teams include your primary Cardiologist (physician) and Advanced Practice Providers (APPs -  Physician Assistants and Nurse Practitioners) who all work together to provide you with the care you need, when you need it.  We recommend signing up for the patient portal called "MyChart".  Sign up information is provided on this After Visit Summary.  MyChart is used to connect with patients for Virtual Visits (Telemedicine).  Patients are able to view lab/test results, encounter notes, upcoming appointments, etc.  Non-urgent messages can be sent to your provider as well.   To learn more about what you can do with MyChart, go to [email or web address].    Your next appointment:   1 month(s)  Provider:   Francis Dowse, PA-C    Other Instructions

## 2023-02-14 ENCOUNTER — Other Ambulatory Visit: Payer: Self-pay | Admitting: Gastroenterology

## 2023-02-27 ENCOUNTER — Other Ambulatory Visit: Payer: Self-pay | Admitting: Gastroenterology

## 2023-02-27 NOTE — Progress Notes (Unsigned)
Cardiology Office Note:  .   Date:  02/27/2023  ID:  Diane Freeman, DOB [birth year], MRN [id number] PCP: Para March, DO   [facility] Providers Cardiologist:  None Electrophysiologist:  Will Jorja Loa, MD {  History of Present Illness: .   Diane Freeman is a 77 y.o. female w/PMHx of traumatic SDH 2015, Chron's, GIB, diverticulosis, GERD, esophageal stricture, HTN, HLD, CKD (III), remote cath (2015) with no obstructive CAD   Admitted 12/03/22,  abdominal discomfort, vomiting, and urinary complaints, found with low grade temp (met sepsis criteria with fever and elevated lactic acid, UTI), admitted and started on ABX, IVF, LLE edema evaluted and neg for DVT UTI.  She developed rapid AFib treated with IV metoprolol > pause and cardiology consulted EP brought on board  New onset AFib with a number of pauses after IV BB, longest 9.2 seconds.  in setting of UTI/sepsis, 101 temp (on arrival) No hx of near syncope or syncope, recommended amiodarone to help maintain SR with rec to f/u in a few months out patient with plans to stop amiodarone then  She saw S. Riddle, NP 12/20/22, doing well, gaining strength back, saw her PMD who by auscultation felt she was in AFib (no EKG) She was in SR at this visit, amiodarone reduced to 200mg  daily with plans to follow up again in 4-6 weeks  I saw her 02/05/23 Ectopic atrial rhythm No symptoms of arrhythmia or otherwise Felt better on lower amio dose Amiodarone stopped Discussed AF occurred in environment of acute illness OAC maintained, perhaps look to loop to guide arrhythmia and OAC management   Today's visit is scheduled as a 53mo f/u for EKG off amio  ROS:   *** EKG *** burden, symptoms *** eliquis, dose, labs, bleeding   Arrhythmia/AAD hx Found 12/2022 during hospitalization with sepsis Amiodarone started 12/2022  Studies Reviewed: Marland Kitchen    EKG done today and reviewed by myself ***   TTE, 12/05/2022  1. Left ventricular  ejection fraction, by estimation, is 55 to 60%. The left ventricle has normal function. The left ventricle has no regional wall motion abnormalities. There is mild left ventricular hypertrophy. Left ventricular diastolic parameters are consistent with Grade II diastolic dysfunction (pseudonormalization).   2. Right ventricular systolic function is normal. The right ventricular size is normal. There is moderately elevated pulmonary artery systolic pressure.   3. Left atrial size was mild to moderately dilated.   4. The mitral valve is grossly normal. Trivial mitral valve regurgitation.   5. Tricuspid valve regurgitation is moderate.   6. The aortic valve is tricuspid. Aortic valve regurgitation is not visualized.   7. The inferior vena cava is normal in size with greater than 50% respiratory variability, suggesting right atrial pressure of 3 mmHg.    06/13/2013 TTE Study Conclusions  - Left ventricle: The cavity size was normal. Wall thickness    was normal. Systolic function was normal. The estimated    ejection fraction was in the range of 60% to 65%. Wall    motion was normal; there were no regional wall motion    abnormalities. Doppler parameters are consistent with    abnormal left ventricular relaxation (grade 1 diastolic    dysfunction).  - Tricuspid valve: Mild-moderate regurgitation.  - Pulmonary arteries: PA peak pressure: 32mm Hg (S).  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 ? If ASD  PFO   06/15/2013: R/LHC Final Conclusions:   1. Patent coronary arteries with minimal nonobstructive CAD 2. Normal intracardiac hemodynamics 3. Normal LV function 4. No evidence of left-to-right intracardiac shunt   Risk Assessment/Calculations:    Physical Exam:   VS:  There were no vitals taken for this visit.   Wt Readings from Last 3 Encounters:  02/05/23 139 lb 12.8 oz (63.4 kg)  12/31/22 142 lb  (64.4 kg)  12/28/22 145 lb (65.8 kg)    GEN: Well nourished, well developed in no acute distress NECK: No JVD; No carotid bruits CARDIAC: *** RRR, no murmurs, rubs, gallops RESPIRATORY: *** CTA b/l without rales, wheezing or rhonchi  ABDOMEN: Soft, non-tender, non-distended EXTREMITIES: *** No edema; No deformity    ASSESSMENT AND PLAN: .    paroxysmal AFib CHA2DS2Vasc is 3, on Eliquis, appropriately dosed *** no burden by symptoms  Occurred in the environment of a hospitalization with sepsis *** Might consider loop or some kind of tech if thoughts toward stopping a/c in the future  HTN *** Looks good  Secondary hypercoagulable state 2/2 AFib    Dispo: ***  Signed, [name], PA-C

## 2023-03-05 ENCOUNTER — Ambulatory Visit: Payer: Medicare Other | Admitting: Physician Assistant

## 2023-03-22 ENCOUNTER — Encounter (HOSPITAL_COMMUNITY): Payer: Self-pay

## 2023-03-22 ENCOUNTER — Emergency Department (HOSPITAL_COMMUNITY): Payer: Medicare Other

## 2023-03-22 ENCOUNTER — Other Ambulatory Visit: Payer: Self-pay

## 2023-03-22 ENCOUNTER — Emergency Department (HOSPITAL_COMMUNITY)
Admission: EM | Admit: 2023-03-22 | Discharge: 2023-03-22 | Disposition: A | Discharge status: Home / Self Care | Payer: Medicare Other | Attending: Emergency Medicine | Admitting: Emergency Medicine

## 2023-03-22 DIAGNOSIS — R079 Chest pain, unspecified: Secondary | ICD-10-CM | POA: Diagnosis not present

## 2023-03-22 DIAGNOSIS — B379 Candidiasis, unspecified: Secondary | ICD-10-CM | POA: Insufficient documentation

## 2023-03-22 DIAGNOSIS — I10 Essential (primary) hypertension: Secondary | ICD-10-CM | POA: Diagnosis not present

## 2023-03-22 DIAGNOSIS — R0789 Other chest pain: Secondary | ICD-10-CM | POA: Diagnosis not present

## 2023-03-22 DIAGNOSIS — K449 Diaphragmatic hernia without obstruction or gangrene: Secondary | ICD-10-CM | POA: Diagnosis not present

## 2023-03-22 LAB — BASIC METABOLIC PANEL
Anion gap: 9 (ref 5–15)
BUN: 13 mg/dL (ref 8–23)
CO2: 20 mmol/L — ABNORMAL LOW (ref 22–32)
Calcium: 9.2 mg/dL (ref 8.9–10.3)
Chloride: 105 mmol/L (ref 98–111)
Creatinine, Ser: 1.1 mg/dL — ABNORMAL HIGH (ref 0.44–1.00)
GFR, Estimated: 51 mL/min — ABNORMAL LOW (ref >60)
Glucose, Bld: 100 mg/dL — ABNORMAL HIGH (ref 70–99)
Potassium: 3.2 mmol/L — ABNORMAL LOW (ref 3.5–5.1)
Sodium: 134 mmol/L — ABNORMAL LOW (ref 135–145)

## 2023-03-22 LAB — CBC
HCT: 36.7 % (ref 36.0–46.0)
Hemoglobin: 11.7 g/dL — ABNORMAL LOW (ref 12.0–15.0)
MCH: 25.9 pg — ABNORMAL LOW (ref 26.0–34.0)
MCHC: 31.9 g/dL (ref 30.0–36.0)
MCV: 81.4 fL (ref 80.0–100.0)
Platelets: 163 10*3/uL (ref 150–400)
RBC: 4.51 MIL/uL (ref 3.87–5.11)
RDW: 16.5 % — ABNORMAL HIGH (ref 11.5–15.5)
WBC: 3.7 10*3/uL — ABNORMAL LOW (ref 4.0–10.5)
nRBC: 0 % (ref 0.0–0.2)

## 2023-03-22 LAB — TROPONIN I (HIGH SENSITIVITY)
Troponin I (High Sensitivity): 5 ng/L (ref <18)
Troponin I (High Sensitivity): 4 ng/L (ref <18)

## 2023-03-22 LAB — D-DIMER, QUANTITATIVE: D-Dimer, Quant: 0.52 ug{FEU}/mL — ABNORMAL HIGH (ref 0.00–0.50)

## 2023-03-22 MED ORDER — NYSTATIN 100000 UNIT/GM EX CREA: TOPICAL_CREAM | CUTANEOUS | 0 refills | Status: DC

## 2023-03-22 NOTE — ED Provider Notes (Signed)
[name] EMERGENCY DEPARTMENT AT [facility] Provider Note   CSN: [id number] Arrival date & time: 03/22/23  1424     History  Chief Complaint  Patient presents with   Chest Pain    Diane Freeman is a 77 y.o. female.  77 yo F with a chief complaint of chest pain.  This is left-sided she has trouble describing it.  Started while she was watching TV.  Seems to come and go.  She has had this before but this time it seem to be a little bit more severe and lasted longer than typical.  She thinks it feels quite a bit better than it did initially but is still mildly there.  She denies cough congestion or fever.  Denies trauma.  Patient denies history of MI, denies hypertension hyperlipidemia diabetes or smoking.  Denies family history of MI.  Patient denies history of PE or DVT denies hemoptysis denies unilateral lower extremity edema denies recent surgery immobilization hospitalization estrogen use or history of cancer.     Chest Pain      Home Medications Prior to Admission medications   Medication Sig Start Date End Date Taking? Authorizing Provider  nystatin cream (MYCOSTATIN) Apply to affected area 2 times daily 03/22/23  Yes Melene Plan, DO  apixaban (ELIQUIS) 5 MG TABS tablet Take 1 tablet (5 mg total) by mouth 2 (two) times daily. 12/07/22   Diane Martinez, MD  Cholecalciferol (VITAMIN D-3 PO) Take 1 capsule by mouth daily.    [provider]  ferrous sulfate 325 (65 FE) MG tablet Take 1 tablet (325 mg total) by mouth every other day. 01/28/23   Freeman, Kirstie, DO  mesalamine (APRISO) 0.375 g 24 hr capsule TAKE 4 CAPSULES (1.5 G TOTAL) BY MOUTH DAILY. PLEASE SCHEDULE A FOLLOW UP VISIT FOR FURTHER REFILLS 02/27/23   Benancio Deeds, MD  omeprazole (PRILOSEC) 20 MG capsule TAKE 1 CAPSULE BY MOUTH DAILY. PLEASE SCHEDULE A YEARLY FOLLOW UP FOR FURTHER REFILLS. THANK YOU 01/03/23   Freeman, Diane Rayas, MD      Allergies    Patient has no known  allergies.    Review of Systems   Review of Systems  Cardiovascular:  Positive for chest pain.    Physical Exam Updated Vital Signs BP (!) 159/75   Pulse 62   Temp 97.6 F (36.4 C) (Oral)   Resp 17   Ht 5\' 3"  (1.6 m)   Wt 68 kg   SpO2 100%   BMI 26.57 kg/m  Physical Exam Vitals and nursing note reviewed.  Constitutional:      General: She is not in acute distress.    Appearance: She is well-developed. She is not diaphoretic.  HENT:     Head: Normocephalic and atraumatic.  Eyes:     Pupils: Pupils are equal, round, and reactive to light.  Cardiovascular:     Rate and Rhythm: Normal rate and regular rhythm.     Heart sounds: No murmur heard.    No friction rub. No gallop.  Pulmonary:     Effort: Pulmonary effort is normal.     Breath sounds: No wheezing or rales.  Chest:     Comments: The patient has a rash in the area of her discomfort but has 1 on the other side as well.  Looks erythematous with satellite lesions. Abdominal:     General: There is no distension.     Palpations: Abdomen is soft.     Tenderness: There is  no abdominal tenderness.  Musculoskeletal:        General: No tenderness.     Cervical back: Normal range of motion and neck supple.  Skin:    General: Skin is warm and dry.  Neurological:     Mental Status: She is alert and oriented to person, place, and time.  Psychiatric:        Behavior: Behavior normal.     ED Results / Procedures / Treatments   Labs (all labs ordered are listed, but only abnormal results are displayed) Labs Reviewed  BASIC METABOLIC PANEL - Abnormal; Notable for the following components:      Result Value   Sodium 134 (*)    Potassium 3.2 (*)    CO2 20 (*)    Glucose, Bld 100 (*)    Creatinine, Ser 1.10 (*)    GFR, Estimated 51 (*)    All other components within normal limits  CBC - Abnormal; Notable for the following components:   WBC 3.7 (*)    Hemoglobin 11.7 (*)    MCH 25.9 (*)    RDW 16.5 (*)    All other  components within normal limits  D-DIMER, QUANTITATIVE (NOT AT [facility]) - Abnormal; Notable for the following components:   D-Dimer, Quant 0.52 (*)    All other components within normal limits  TROPONIN I (HIGH SENSITIVITY)  TROPONIN I (HIGH SENSITIVITY)    EKG EKG Interpretation Date/Time:  Friday March 22 2023 14:33:07 EST Ventricular Rate:  58 PR Interval:  172 QRS Duration:  70 QT Interval:  454 QTC Calculation: 445 R Axis:   -18  Text Interpretation: Sinus bradycardia Low voltage QRS Nonspecific ST and T wave abnormality Abnormal ECG When compared with ECG of 05-Feb-2023 11:25, No significant change since last tracing Confirmed by Vonita Moss [phone]) on 03/22/2023 4:43:07 PM  Radiology DG Chest 2 View Result Date: 03/22/2023 CLINICAL DATA:  Chest pain EXAM: CHEST - 2 VIEW COMPARISON:  05/30/2022 FINDINGS: The heart size and mediastinal contours are within normal limits. Both lungs are clear. The visualized skeletal structures are unremarkable. Small hiatal hernia. IMPRESSION: No active cardiopulmonary disease. Small hiatal hernia. Electronically Signed   By: Jasmine Pang M.D.   On: 03/22/2023 16:51    Procedures Procedures    Medications Ordered in ED Medications - No data to display  ED Course/ Medical Decision Making/ A&P                                 Medical Decision Making Amount and/or Complexity of Data Reviewed Labs: ordered. Radiology: ordered.   77 yo F with a chief complaints of chest pain.  This has been going on for about an hour or so prior to arrival.  She has had symptoms similar off and on for some time.  Not exertional she is not sure what seems to make it better or worse.  Her lab work is resulted, her troponins are negative x 2.  Her D-dimer is age-adjusted negative.  Chest x-ray independently interpreted by me without focal infiltrate or pneumothorax.  No acute anemia, no significant electrolyte abnormalities.  I discussed the results with  the patient.  Will treat as possible reflux.  PCP follow-up.  7:46 PM:  I have discussed the diagnosis/risks/treatment options with the patient.  Evaluation and diagnostic testing in the emergency department does not suggest an emergent condition requiring admission or immediate intervention beyond what has  been performed at this time.  They will follow up with PCP. We also discussed returning to the ED immediately if new or worsening sx occur. We discussed the sx which are most concerning (e.g., sudden worsening pain, fever, inability to tolerate by mouth) that necessitate immediate return. Medications administered to the patient during their visit and any new prescriptions provided to the patient are listed below.  Medications given during this visit Medications - No data to display   The patient appears reasonably screen and/or stabilized for discharge and I doubt any other medical condition or other [facility] requiring further screening, evaluation, or treatment in the ED at this time prior to discharge.          Final Clinical Impression(s) / ED Diagnoses Final diagnoses:  Nonspecific chest pain  Candidiasis    Rx / DC Orders ED Discharge Orders          Ordered    nystatin cream (MYCOSTATIN)        03/22/23 1941              Melene Plan, DO 03/22/23 1946

## 2023-03-22 NOTE — ED Provider Triage Note (Signed)
Emergency Medicine Provider Triage Evaluation Note  Diane Freeman , a 77 y.o. female crohns, AF, and GERD was evaluated in triage.  Pt complains of chest pain x 1 hour. Substernal pressure. Nonradiating. Pleuritic and exertional. +SOB. Noncompliant with eliquis.  Review of Systems  Positive: See above Negative: fever  Physical Exam  BP (!) 170/63   Pulse 63   Temp 98.4 F (36.9 C) (Oral)   Resp 20   Ht 5\' 3"  (1.6 m)   Wt 68 kg   SpO2 97%   BMI 26.57 kg/m  Gen:   Awake, no distress   Resp:  Normal effort  MSK:   Moves extremities without difficulty  Other:    Medical Decision Making  Medically screening exam initiated at 2:51 PM.  Appropriate orders placed.  Diane Freeman was informed that the remainder of the evaluation will be completed by another provider, this initial triage assessment does not replace that evaluation, and the importance of remaining in the ED until their evaluation is complete.   Diane Baton, MD 03/22/23 [id number]

## 2023-03-22 NOTE — ED Triage Notes (Signed)
Bib ems form home; c/o cp approx 30 minutes pta; on eliquis, has not been taking for several months; hx afib; denies sob; 12 lead unremarkable; 324 asa given pta; 18 ga lac; 160/100, P 84, cbg 146, 99% RA

## 2023-03-22 NOTE — Discharge Instructions (Signed)
Try pepcid or tagamet up to twice a day.  Try to avoid things that may make this worse, most commonly these are spicy foods tomato based products fatty foods chocolate and peppermint.  Alcohol and tobacco can also make this worse.  Return to the emergency department for sudden worsening pain fever or inability to eat or drink.  

## 2023-03-22 NOTE — ED Notes (Signed)
Pt son called requesting update at this time, informed pt up for d/c and needing a ride home. Informed ETA of 30 minutes.

## 2023-03-23 ENCOUNTER — Other Ambulatory Visit: Payer: Self-pay | Admitting: Gastroenterology

## 2023-03-25 ENCOUNTER — Other Ambulatory Visit: Payer: Self-pay | Admitting: Gastroenterology

## 2023-03-29 ENCOUNTER — Other Ambulatory Visit: Payer: Self-pay | Admitting: Gastroenterology

## 2023-04-04 ENCOUNTER — Telehealth: Payer: Self-pay

## 2023-04-04 NOTE — Telephone Encounter (Signed)
-----   Message from [facility] sent at 04/01/2023  9:24 AM EST ----- Regarding: Appt Schedule Hey, can yall make an appointment for ED follow up visit for this patient?  Thank you!!

## 2023-04-04 NOTE — Telephone Encounter (Signed)
 Patient is coming in on 04/11/23 @210  seeing Dr.Everhart
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
## 2023-04-08 ENCOUNTER — Ambulatory Visit: Payer: Medicare Other | Attending: Physician Assistant | Admitting: Physician Assistant

## 2023-04-09 ENCOUNTER — Encounter: Payer: Self-pay | Admitting: Physician Assistant

## 2023-04-11 ENCOUNTER — Ambulatory Visit: Payer: Medicare Other | Admitting: Family Medicine
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
## 2023-04-15 ENCOUNTER — Ambulatory Visit: Payer: Medicare Other | Admitting: Physician Assistant

## 2023-06-03 ENCOUNTER — Telehealth: Payer: Self-pay

## 2023-06-03 NOTE — Telephone Encounter (Signed)
 Patient's sister, Alexia Freestone, calls nurse line regarding upcoming appointment tomorrow.   She reports that patient has been having issues with her memory for the last few months. Patient has also been having increased fatigue and recent sickness (congestion, cough, fever). She has not been running fever in the last 24 hours.   Patty reports concerns for possible stroke within the last few weeks. Patient is not having any facial drooping, slurred speech, arm weakness, balance issues.   Advised that patient keep scheduled follow up tomorrow and provided with ED precautions.   Veronda Prude, RN

## 2023-06-04 ENCOUNTER — Ambulatory Visit (INDEPENDENT_AMBULATORY_CARE_PROVIDER_SITE_OTHER): Admitting: Student

## 2023-06-04 ENCOUNTER — Ambulatory Visit: Admitting: Student

## 2023-06-04 ENCOUNTER — Ambulatory Visit: Payer: Medicaid Other | Admitting: Gastroenterology

## 2023-06-04 ENCOUNTER — Other Ambulatory Visit: Payer: Self-pay

## 2023-06-04 VITALS — BP 131/73 | HR 63 | Ht 63.0 in | Wt 133.6 lb

## 2023-06-04 DIAGNOSIS — L304 Erythema intertrigo: Secondary | ICD-10-CM

## 2023-06-04 DIAGNOSIS — R9431 Abnormal electrocardiogram [ECG] [EKG]: Secondary | ICD-10-CM | POA: Diagnosis not present

## 2023-06-04 DIAGNOSIS — R7989 Other specified abnormal findings of blood chemistry: Secondary | ICD-10-CM | POA: Diagnosis not present

## 2023-06-04 DIAGNOSIS — R5383 Other fatigue: Secondary | ICD-10-CM | POA: Diagnosis not present

## 2023-06-04 LAB — POCT GLYCOSYLATED HEMOGLOBIN (HGB A1C): Hemoglobin A1C: 5.3 % (ref 4.0–5.6)

## 2023-06-04 MED ORDER — NYSTATIN 100000 UNIT/GM EX CREA: TOPICAL_CREAM | CUTANEOUS | 0 refills | Status: DC

## 2023-06-04 NOTE — Assessment & Plan Note (Addendum)
 Remote history of Afib, off amiodarone and took self off of Eliquis. History of SDH and age makes her a somewhat poor candidate for [facility] but need to have a risk and benefit discussion. No evidence of infection as cause of her constellation of symptoms. Worry about cardiac decompensation or return of Afib with risk of stroke given memory decline. Needs basic labs with repeat echo to assess heart function. Additionally, obtain brain MR for further assistance. Needs geriatric visit and discussion of assistance at home. Patient does not want to live with anyone, lives alone. Discussed plan with patient and niece in room. Will schedule visit with the patient back with [place] and keep close eye as she is high risk. Will have family members come check on her regularly. HOLD meds other than ppi for now.   Repeat EKG in office similar to previous in sinus brady

## 2023-06-04 NOTE — Patient Instructions (Addendum)
 It was great to see you today! Thank you for choosing [facility] for your primary care.  Today we addressed: We will obtain some labs today  Also we will schedule the ultrasound and head imaging  Use cream under breast twice a day until we see you next week Schedule appt with your cardiologist as well   If you haven't already, sign up for My Chart to have easy access to your labs results, and communication with your primary care physician.   Please arrive 15 minutes before your appointment to ensure smooth check in process.  We appreciate your efforts in making this happen.  Thank you for allowing me to participate in your care, Alfredo Martinez, MD 06/04/2023, 9:45 AM PGY-3, [facility]

## 2023-06-04 NOTE — Progress Notes (Deleted)
 HPI :  Seen 10/2021:  IBD HISTORY 78 y/o female: diagnosed with Crohns colitis since 2007. She has been on Apriso 1.5gm / day.  She has been on on Lialda previously and the patient thinks it caused her hair to fall out. She thinks when she stopped Lialda the hair loss issue stopped. She has not had any hair loss with Apriso.     SINCE LAST VISIT   78 year old female here for follow-up for her colitis.  I last saw her May 2022 for her colonoscopy.  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  She continues to do quite well on Apriso monotherapy.  She has not had any flares of her colitis since have last seen her.  She denies any problems with her bowel habits.  No blood in her stools.  No abdominal pains. We discussed that she has had disease for at least 16 years now, how often she wants to have colonoscopy exams, and when she wants to stop having surveillance exams.   Otherwise she is doing pretty well in regards to her reflux.  She takes omeprazole 20 mg every day and that typically works pretty well to control her symptoms and keep them at bay.  She does not have any significant breakthrough when she takes the medication but can have regurgitation at times, especially if she does not take her medicines.  She has mild dysphagia which she has had in the past, has a distal esophageal stricture and hiatal hernia, which was dilated in 2020 which helped her symptoms but they have since recurred over time but mild.  No progression since have last seen her, she wants to hold off on dilation if possible.       Prior endoscopic evaluation. Colonoscopy 08/16/2020: The perianal and digital rectal examinations were normal. - The terminal ileum appeared normal. - Diffuse pseudopolyps were found in the sigmoid colon, in the descending colon and in the transverse colon. No overtly adenomatous  polyps appreciated. Multiple pseudopolyps removed. - Multiple diverticula were found in the transverse colon and left colon. - The exam was otherwise without abnormality. No active inflammation appreciated. Prep was adequate but several minutes spent lavaging the colon to achieve adequate views. - Biopsies were taken with a cold forceps from the right colon, left colon, transverse colon and rectum. These biopsy specimens were sent to Pathology.   1. Surgical [P], right colon biopsies ( h/o colitis ) - BENIGN COLONIC MUCOSA. - NO ACTIVE INFLAMMATION. - NO DYSPLASIA OR MALIGNANCY. 2. Surgical [P], colon, transverse ( h/o colitis ) - BENIGN COLONIC MUCOSA. - NO ACTIVE INFLAMMATION. - NO DYSPLASIA OR MALIGNANCY. 3. Surgical [P], left colon biopsies ( h/o colitis ) - BENIGN COLONIC MUCOSA. - NO ACTIVE INFLAMMATION. - NO DYSPLASIA OR MALIGNANCY     EGD 05/20/18 -  - A 4 cm hiatal hernia was present. - One benign-appearing, intrinsic stenosis was found 33 cm from the incisors, entering into the hernia sac. This stenosis measured less than one cm (in length). There was a subtle change in the mucosa on initial inspection which appeared to be inflamed tissue from prolapse within the hernia sac. A TTS dilator was passed through the scope. Dilation with a 16-17-18 mm balloon dilator was performed to 16 mm, 17 mm and 18 mm after which a small mucosal wrent was noted. The stricture was fairly pliable however, dysphagia could  be coming from the angulated turn entering the hernia sac. Biopsies were taken with a cold forceps for histology to ensure benign. - The exam of the esophagus was otherwise normal. - The entire examined stomach was normal. - The duodenal bulb and second portion of the duodenum were normal.   Surgical [P], stricture site - CARDIAC-TYPE MUCOSA WITH MILD NON-SPECIFIC INFLAMMATION. - NEGATIVE FOR INTESTINAL METAPLASIA, DYSPLASIA OR MALIGNANCY.       Colonoscopy 02/18/19 -  The perianal and digital rectal examinations were normal. - The terminal ileum appeared normal. - Multiple medium-mouthed diverticula were found in the sigmoid colon, descending colon and transverse colon. - Patchy pseudopolyps were found in the descending colon, in the transverse colon and in the ascending colon, many of these were biopsied as below. - The exam was otherwise without abnormality. No suspected adenomas appreciated. - Biopsies were taken with a cold forceps from the right colon, left colon and transverse colon. These biopsy specimens were sent to Pathology.   1. Surgical [P], right colon - CHRONIC COLITIS WITH FOCAL ACTIVITY - NO GRANULOMATA, DYSPLASIA OR MALIGNANCY IDENTIFIED 2. Surgical [P], colon, transverse - CHRONIC MILDLY ACTIVE COLITIS - NO GRANULOMATA, DYSPLASIA OR MALIGNANCY IDENTIFIED 3. Surgical [P], left colon - CHRONIC MILDLY ACTIVE COLITIS - NO GRANULOMATA, DYSPLASIA OR MALIGNANCY IDENTIFIED     Colonoscopy 04/29/15 - mild erythema / inflammatory changes of the left colon, inflammatory pseudopolyposis from the left colon through transverse colon - surveillance biopsies taken throughout the entire colon showing mildly active colitis, One 8mm polyp removed via hot snare in the transverse colon c/w adenoma   Colonoscopy 04/26/2016 - splenic flexure adenoma, multiple transverse pseudoinflammatory polyps, normal colon otherwise - benign mucosa / quiscent colitis on path   Colonoscopy 09/03/2017 - terminal ileum normal, diverticulosis, pseudoinflammatory polyps splenic flexure and in the transverse colon, many of these were sampled with cold forceps, mild patchy erythema in the left colon. The exam was otherwise without abnormality / inflammation on direct and retroflexion views.   EGD 09/23/2007 - mid esophageal stricture, dilated to 16mm    78 y.o. female here for assessment of the following   1. Crohn's disease of colon without complication (HCC)   2.  Gastroesophageal reflux disease, unspecified whether esophagitis present   3. Long-term current use of proton pump inhibitor therapy     Generally has done really well with management of her mild colitis with mesalamine monotherapy.  We have discussed her options in the past and she has wanted to continue her regimen.  She did not have any active inflammation on her last exam, no symptoms that bother her.  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  We discussed if and when she wanted to do another colonoscopy.  She does want to do another colonoscopy within the next year, we will see what that shows and she may consider another exam in her early 31s pending her course.  She will continue mesalamine for now and call me in the interim with any issues.  I will see her after the new year for scheduling.   Otherwise doing quite well on omeprazole monotherapy which is controlling her reflux.  Known mild stricture that is responded to dilation in the past, mild intermittent dysphagia, she does not think worth repeating a dilation at this time.  We may consider EGD at the timing of her next colonoscopy based on how she is feeling.  Otherwise continue omeprazole at lowest dose  needed to control symptoms.  I discussed long-term risk benefits of PPIs with her and she understands.     PLAN: - tentatively plan next colonoscopy for May 2023 - refill Apriso, continue daily - refill omeprazole, continue lowest dose of PPI needed to control symptoms. Have discussed risks /benefits of long term PPI therapy - f/u 1 year otherwise   Harlin Rain, MD [facility]   CTA 12/03/22: IMPRESSION: 1. Aortic Atherosclerosis (ICD10-I70.0). Negative for aortic aneurysm or dissection.   2. No definite acute or inflammatory process identified in the chest, abdomen, or pelvis: - moderate to large gastric hiatal hernia, chronic but increased since 2015. - suspect  Emphysema (ICD10-J43.9) has developed since 2015. - borderline Hepatosplenomegaly , nonspecific. - occasional large and small bowel diverticula. No convincing bowel inflammation.    Echo 12/05/22: 1. Left ventricular ejection fraction, by estimation, is 55 to 60%. The  left ventricle has normal function. The left ventricle has no regional  wall motion abnormalities. There is mild left ventricular hypertrophy.  Left ventricular diastolic parameters  are consistent with Grade II diastolic dysfunction (pseudonormalization).   2. Right ventricular systolic function is normal. The right ventricular  size is normal. There is moderately elevated pulmonary artery systolic  pressure.   3. Left atrial size was mild to moderately dilated.   4. The mitral valve is grossly normal. Trivial mitral valve  regurgitation.   5. Tricuspid valve regurgitation is moderate.   6. The aortic valve is tricuspid. Aortic valve regurgitation is not  visualized.   7. The inferior vena cava is normal in size with greater than 50%  respiratory variability, suggesting right atrial pressure of 3 mmHg.   Past Medical History:  Diagnosis Date   Abdominal pain 12/03/2022   Adrenal nodule (HCC)    Anemia    Blood transfusion without reported diagnosis    Cataract    bil removed   Choledocholithiasis with obstruction    Colitis    Crohn's colitis (HCC) 07/11/2012   Diastolic dysfunction    a. 05/2013 Echo: EF 60-65%, no rwma, GrI DD, mild-mod TR.   Diverticulosis    Fatty liver    GERD (gastroesophageal reflux disease)    Hiatal hernia    History of esophageal stricture    History of left heart catheterization    a. 05/2013 Cath: Nl cors. Nl R heart filling pressures.  No L -> R shunt.  EF 65%.   Hx of gastritis    Lower extremity edema 12/03/2022   Palpitations    a. 09/2014 Zio: Rare PACs/PVCs.   Pyelonephritis 05/29/2022   Right shoulder pain 12/03/2022   Sepsis (HCC) 12/03/2022   UTI (urinary tract  infection) 05/14/2013     Past Surgical History:  Procedure Laterality Date   CARDIAC CATHETERIZATION  05/2013   CATARACT EXTRACTION Bilateral    CHOLECYSTECTOMY  2009   COLONOSCOPY  last 09/03/2017, 2020   ERCP  06/02/2011   Procedure: ENDOSCOPIC RETROGRADE CHOLANGIOPANCREATOGRAPHY (ERCP);  Surgeon: Iva Boop, MD;  Location: [facility];  Service: Endoscopy;  Laterality: N/A;   LEFT AND RIGHT HEART CATHETERIZATION WITH CORONARY ANGIOGRAM N/A 06/15/2013   Procedure: LEFT AND RIGHT HEART CATHETERIZATION WITH CORONARY ANGIOGRAM- Shunt series;  Surgeon: Micheline Chapman, MD;  Location: [facility] CATH LAB;  Service: Cardiovascular;  Laterality: N/A;   TUBAL LIGATION     UPPER GASTROINTESTINAL ENDOSCOPY     Family History  Problem Relation Age of Onset   Bone cancer Maternal Grandmother  Colon cancer Maternal Aunt 60   Stomach cancer Sister 37       decsd   Breast cancer Sister    Esophageal cancer Neg Hx    Rectal cancer Neg Hx    Pancreatic cancer Neg Hx    Colon polyps Neg Hx    Social History   Tobacco Use   Smoking status: Never   Smokeless tobacco: Never  Vaping Use   Vaping status: Never Used  Substance Use Topics   Alcohol use: No    Alcohol/week: 0.0 standard drinks of alcohol   Drug use: No   Current Outpatient Medications  Medication Sig Dispense Refill   apixaban (ELIQUIS) 5 MG TABS tablet Take 1 tablet (5 mg total) by mouth 2 (two) times daily. 60 tablet 0   Cholecalciferol (VITAMIN D-3 PO) Take 1 capsule by mouth daily.     ferrous sulfate 325 (65 FE) MG tablet Take 1 tablet (325 mg total) by mouth every other day. 90 tablet 1   mesalamine (APRISO) 0.375 g 24 hr capsule TAKE 4 CAPSULES (1.5 G TOTAL) BY MOUTH DAILY. PLEASE SCHEDULE A FOLLOW UP VISIT FOR FURTHER REFILLS 120 capsule 2   nystatin cream (MYCOSTATIN) Apply to affected area 2 times daily 30 g 0   omeprazole (PRILOSEC) 20 MG capsule TAKE 1 CAPSULE BY MOUTH DAILY. PLEASE SCHEDULE A YEARLY FOLLOW UP FOR  FURTHER REFILLS. THANK YOU 30 capsule 0   No current facility-administered medications for this visit.   No Known Allergies   Review of Systems: All systems reviewed and negative except where noted in HPI.    No results found.  Physical Exam: There were no vitals taken for this visit. Constitutional: Pleasant,well-developed, ***female in no acute distress. HEENT: Normocephalic and atraumatic. Conjunctivae are normal. No scleral icterus. Neck supple.  Cardiovascular: Normal rate, regular rhythm.  Pulmonary/chest: Effort normal and breath sounds normal. No wheezing, rales or rhonchi. Abdominal: Soft, nondistended, nontender. Bowel sounds active throughout. There are no masses palpable. No hepatomegaly. Extremities: no edema Lymphadenopathy: No cervical adenopathy noted. Neurological: Alert and oriented to person place and time. Skin: Skin is warm and dry. No rashes noted. Psychiatric: Normal mood and affect. Behavior is normal.   ASSESSMENT: 78 y.o. female here for assessment of the following  No diagnosis found.  PLAN:   Everhart, Kirstie, DO

## 2023-06-04 NOTE — Progress Notes (Addendum)
 SUBJECTIVE:   CHIEF COMPLAINT / HPI:   Fatigue  Concern for Stroke?: Per nursing note:  "She reports that patient has been having issues with her memory for the last few months. Patient has also been having increased fatigue and recent sickness (congestion, cough, fever). She has not been running fever in the last 24 hours.    Patty reports concerns for possible stroke within the last few weeks. Patient is not having any facial drooping, slurred speech, arm weakness, balance issues."  - Reports today worsening fatigue, shortness of breath with ambulation (can walk about ten feet), denies fever or chills - only taking omeprazole but not any other medications  - Per cardiology note, was taken off of Amio but not Eliquis  - However, patient reports that when she finishes her medication from the bottle, she stops it - Currently not taking Eliquis - Patient denies syncope, recent trauma, chest pain, current shortness of breath - Accompanied by niece  - orthopnea noted  - Decreased appetite  - Lives home alone  - Patient reports that she started leaning to the left with ambulation - ALL of these symptoms have been ongoing for months, nothing is acute in nature.   PERTINENT  PMH / PSH:  Traumatic SDH 2015 diastolic heart failure  pAfib with RVR  Esophageal strictures  Actinic keratoses  Crohns - on Mesalamine  HLD  Hyponatremia  CKD III HTN  Of note, this patient was admitted on 9/24 for abdominal discomfort, urinary complaints and was found to meet sepsis criteria secondary to UTI.  She was started on antibiotics with IV fluids and developed A-fib with RVR.  She was treated with IV metoprolol during this time and had a pause that required cardiology consult with EP involvement.  At that time, she received amiodarone which was discontinued at her follow-up cardiology appointment and was started on anticoagulation that she is not currently taking.    OBJECTIVE:   BP 131/73    Pulse 63   Ht 5\' 3"  (1.6 m)   Wt 133 lb 9.6 oz (60.6 kg)   SpO2 99%   BMI 23.67 kg/m   General: Alert and oriented in no apparent distress Heart: Regular rate and rhythm with extra cardiac sound appreciated Lungs: CTA bilaterally, no wheezing Abdomen: Bowel sounds present, no abdominal pain Skin: Warm and dry, intertriginous rash underneath both breasts, no swelling, drainage Extremities: No lower extremity edema Neuro: CN II: PERRL CN III, IV,VI: EOMI CV V: Normal sensation in V1, V2, V3 CVII: Symmetric smile and brow raise CN VIII: Normal hearing CN IX,X: Symmetric palate raise  CN XI: 5/5 shoulder shrug CN XII: Symmetric tongue protrusion  UE and LE strength 5/5 Normal sensation in UE and LE bilaterally  No ataxia with finger to nose Negative Rhomberg     TTE 12/05/2022:   1. Left ventricular ejection fraction, by estimation, is 55 to 60%. The left ventricle has normal function. The left ventricle has no regional wall motion abnormalities. There is mild left ventricular hypertrophy. Left ventricular diastolic parameters are consistent with Grade II diastolic dysfunction (pseudonormalization).   2. Right ventricular systolic function is normal. The right ventricular size is normal. There is moderately elevated pulmonary artery systolic pressure.   3. Left atrial size was mild to moderately dilated.   4. The mitral valve is grossly normal. Trivial mitral valve regurgitation.   5. Tricuspid valve regurgitation is moderate.   6. The aortic valve is tricuspid. Aortic valve regurgitation is  not visualized.   7. The inferior vena cava is normal in size with greater than 50% respiratory variability, suggesting right atrial pressure of 3 mmHg.   ASSESSMENT/PLAN:   Assessment & Plan Other fatigue Remote history of Afib, off amiodarone and took self off of Eliquis. History of SDH and age makes her a somewhat poor candidate for [facility] but need to have a risk and benefit discussion. No  evidence of infection as cause of her constellation of symptoms. Worry about cardiac decompensation or return of Afib with risk of stroke given memory decline. Needs basic labs with repeat echo to assess heart function. Additionally, obtain brain MR for further assistance. Needs geriatric visit and discussion of assistance at home. Patient does not want to live with anyone, lives alone. Discussed plan with patient and niece in room. Will schedule visit with the patient back with [place] and keep close eye as she is high risk. Will have family members come check on her regularly. HOLD meds other than ppi for now.  Intertrigo See photo in media, antifungal cream.       Alfredo Martinez, MD [facility]

## 2023-06-04 NOTE — Addendum Note (Signed)
 Addended by: Jennette Bill on: 06/04/2023 10:23 AM   Modules accepted: Orders

## 2023-06-05 ENCOUNTER — Encounter: Payer: Self-pay | Admitting: Student

## 2023-06-05 LAB — COMPREHENSIVE METABOLIC PANEL
ALT: 27 IU/L (ref 0–32)
AST: 41 IU/L — ABNORMAL HIGH (ref 0–40)
Albumin: 3.3 g/dL — ABNORMAL LOW (ref 3.8–4.8)
Alkaline Phosphatase: 215 IU/L — ABNORMAL HIGH (ref 44–121)
BUN/Creatinine Ratio: 12 (ref 12–28)
BUN: 12 mg/dL (ref 8–27)
Bilirubin Total: 1 mg/dL (ref 0.0–1.2)
CO2: 21 mmol/L (ref 20–29)
Calcium: 8.9 mg/dL (ref 8.7–10.3)
Chloride: 102 mmol/L (ref 96–106)
Creatinine, Ser: 1.04 mg/dL — ABNORMAL HIGH (ref 0.57–1.00)
Globulin, Total: 3.8 g/dL (ref 1.5–4.5)
Glucose: 90 mg/dL (ref 70–99)
Potassium: 3.4 mmol/L — ABNORMAL LOW (ref 3.5–5.2)
Sodium: 139 mmol/L (ref 134–144)
Total Protein: 7.1 g/dL (ref 6.0–8.5)
eGFR: 55 mL/min/{1.73_m2} — ABNORMAL LOW (ref >59)

## 2023-06-05 LAB — CBC WITH DIFFERENTIAL/PLATELET
Basophils Absolute: 0 10*3/uL (ref 0.0–0.2)
Basos: 1 %
EOS (ABSOLUTE): 0.2 10*3/uL (ref 0.0–0.4)
Eos: 3 %
Hematocrit: 40.4 % (ref 34.0–46.6)
Hemoglobin: 13 g/dL (ref 11.1–15.9)
Immature Grans (Abs): 0 10*3/uL (ref 0.0–0.1)
Immature Granulocytes: 0 %
Lymphocytes Absolute: 1 10*3/uL (ref 0.7–3.1)
Lymphs: 17 %
MCH: 27.5 pg (ref 26.6–33.0)
MCHC: 32.2 g/dL (ref 31.5–35.7)
MCV: 85 fL (ref 79–97)
Monocytes Absolute: 0.5 10*3/uL (ref 0.1–0.9)
Monocytes: 7 %
Neutrophils Absolute: 4.4 10*3/uL (ref 1.4–7.0)
Neutrophils: 72 %
Platelets: 194 10*3/uL (ref 150–450)
RBC: 4.73 x10E6/uL (ref 3.77–5.28)
RDW: 16.4 % — ABNORMAL HIGH (ref 11.7–15.4)
WBC: 6.1 10*3/uL (ref 3.4–10.8)

## 2023-06-05 LAB — TSH RFX ON ABNORMAL TO FREE T4: TSH: 1.74 u[IU]/mL (ref 0.450–4.500)

## 2023-06-06 ENCOUNTER — Ambulatory Visit (HOSPITAL_COMMUNITY)
Admission: RE | Admit: 2023-06-06 | Discharge: 2023-06-06 | Disposition: A | Discharge status: Home / Self Care | Source: Ambulatory Visit | Attending: Family Medicine | Admitting: Family Medicine

## 2023-06-06 DIAGNOSIS — R634 Abnormal weight loss: Secondary | ICD-10-CM | POA: Diagnosis not present

## 2023-06-06 DIAGNOSIS — E785 Hyperlipidemia, unspecified: Secondary | ICD-10-CM | POA: Diagnosis present

## 2023-06-06 DIAGNOSIS — K449 Diaphragmatic hernia without obstruction or gangrene: Secondary | ICD-10-CM | POA: Diagnosis not present

## 2023-06-06 DIAGNOSIS — R5383 Other fatigue: Secondary | ICD-10-CM | POA: Insufficient documentation

## 2023-06-06 DIAGNOSIS — I080 Rheumatic disorders of both mitral and aortic valves: Secondary | ICD-10-CM | POA: Insufficient documentation

## 2023-06-06 DIAGNOSIS — I5032 Chronic diastolic (congestive) heart failure: Secondary | ICD-10-CM | POA: Diagnosis present

## 2023-06-06 DIAGNOSIS — E119 Type 2 diabetes mellitus without complications: Secondary | ICD-10-CM | POA: Insufficient documentation

## 2023-06-06 DIAGNOSIS — K5090 Crohn's disease, unspecified, without complications: Secondary | ICD-10-CM | POA: Diagnosis not present

## 2023-06-06 DIAGNOSIS — Z602 Problems related to living alone: Secondary | ICD-10-CM | POA: Diagnosis present

## 2023-06-06 DIAGNOSIS — K3189 Other diseases of stomach and duodenum: Secondary | ICD-10-CM | POA: Diagnosis not present

## 2023-06-06 DIAGNOSIS — E876 Hypokalemia: Secondary | ICD-10-CM | POA: Diagnosis not present

## 2023-06-06 DIAGNOSIS — R0789 Other chest pain: Secondary | ICD-10-CM | POA: Diagnosis not present

## 2023-06-06 DIAGNOSIS — Z9049 Acquired absence of other specified parts of digestive tract: Secondary | ICD-10-CM | POA: Diagnosis not present

## 2023-06-06 DIAGNOSIS — R9431 Abnormal electrocardiogram [ECG] [EKG]: Secondary | ICD-10-CM | POA: Insufficient documentation

## 2023-06-06 DIAGNOSIS — Z7901 Long term (current) use of anticoagulants: Secondary | ICD-10-CM | POA: Diagnosis not present

## 2023-06-06 DIAGNOSIS — K760 Fatty (change of) liver, not elsewhere classified: Secondary | ICD-10-CM | POA: Diagnosis present

## 2023-06-06 DIAGNOSIS — Z8719 Personal history of other diseases of the digestive system: Secondary | ICD-10-CM | POA: Diagnosis not present

## 2023-06-06 DIAGNOSIS — R1310 Dysphagia, unspecified: Secondary | ICD-10-CM | POA: Diagnosis present

## 2023-06-06 DIAGNOSIS — Z803 Family history of malignant neoplasm of breast: Secondary | ICD-10-CM | POA: Diagnosis not present

## 2023-06-06 DIAGNOSIS — K219 Gastro-esophageal reflux disease without esophagitis: Secondary | ICD-10-CM | POA: Diagnosis not present

## 2023-06-06 DIAGNOSIS — I5030 Unspecified diastolic (congestive) heart failure: Secondary | ICD-10-CM | POA: Diagnosis not present

## 2023-06-06 DIAGNOSIS — K920 Hematemesis: Secondary | ICD-10-CM | POA: Diagnosis not present

## 2023-06-06 DIAGNOSIS — K2100 Gastro-esophageal reflux disease with esophagitis, without bleeding: Secondary | ICD-10-CM | POA: Diagnosis not present

## 2023-06-06 DIAGNOSIS — K2101 Gastro-esophageal reflux disease with esophagitis, with bleeding: Secondary | ICD-10-CM | POA: Diagnosis not present

## 2023-06-06 DIAGNOSIS — Z800 Family history of malignant neoplasm of digestive organs: Secondary | ICD-10-CM | POA: Diagnosis not present

## 2023-06-06 DIAGNOSIS — Z79899 Other long term (current) drug therapy: Secondary | ICD-10-CM | POA: Diagnosis not present

## 2023-06-06 DIAGNOSIS — K208 Other esophagitis without bleeding: Secondary | ICD-10-CM | POA: Diagnosis not present

## 2023-06-06 DIAGNOSIS — R0902 Hypoxemia: Secondary | ICD-10-CM | POA: Diagnosis not present

## 2023-06-06 DIAGNOSIS — K5010 Crohn's disease of large intestine without complications: Secondary | ICD-10-CM | POA: Diagnosis not present

## 2023-06-06 DIAGNOSIS — N1831 Chronic kidney disease, stage 3a: Secondary | ICD-10-CM | POA: Diagnosis present

## 2023-06-06 DIAGNOSIS — I130 Hypertensive heart and chronic kidney disease with heart failure and stage 1 through stage 4 chronic kidney disease, or unspecified chronic kidney disease: Secondary | ICD-10-CM | POA: Diagnosis present

## 2023-06-06 DIAGNOSIS — I480 Paroxysmal atrial fibrillation: Secondary | ICD-10-CM | POA: Diagnosis not present

## 2023-06-06 DIAGNOSIS — Z87820 Personal history of traumatic brain injury: Secondary | ICD-10-CM | POA: Diagnosis not present

## 2023-06-06 LAB — ECHOCARDIOGRAM COMPLETE
AR max vel: 1.87 cm2
AV Area VTI: 1.98 cm2
AV Area mean vel: 1.8 cm2
AV Mean grad: 3 mmHg
AV Peak grad: 5.8 mmHg
Ao pk vel: 1.2 m/s
Area-P 1/2: 2.91 cm2
S' Lateral: 2.2 cm

## 2023-06-06 NOTE — Progress Notes (Signed)
 Echocardiogram 2D Echocardiogram has been performed.  Diane Freeman 06/06/2023, 8:35 AM

## 2023-06-09 ENCOUNTER — Other Ambulatory Visit: Payer: Self-pay

## 2023-06-09 ENCOUNTER — Inpatient Hospital Stay (HOSPITAL_COMMUNITY)
Admission: EM | Admit: 2023-06-09 | Discharge: 2023-06-11 | DRG: 369 | Disposition: A | Discharge status: Home / Self Care | Attending: Family Medicine | Admitting: Family Medicine

## 2023-06-09 ENCOUNTER — Encounter (HOSPITAL_COMMUNITY): Payer: Self-pay

## 2023-06-09 DIAGNOSIS — Z8719 Personal history of other diseases of the digestive system: Secondary | ICD-10-CM

## 2023-06-09 DIAGNOSIS — I130 Hypertensive heart and chronic kidney disease with heart failure and stage 1 through stage 4 chronic kidney disease, or unspecified chronic kidney disease: Secondary | ICD-10-CM | POA: Diagnosis present

## 2023-06-09 DIAGNOSIS — E876 Hypokalemia: Secondary | ICD-10-CM | POA: Diagnosis present

## 2023-06-09 DIAGNOSIS — R0902 Hypoxemia: Secondary | ICD-10-CM | POA: Diagnosis present

## 2023-06-09 DIAGNOSIS — K3189 Other diseases of stomach and duodenum: Secondary | ICD-10-CM | POA: Diagnosis present

## 2023-06-09 DIAGNOSIS — K5090 Crohn's disease, unspecified, without complications: Secondary | ICD-10-CM | POA: Diagnosis not present

## 2023-06-09 DIAGNOSIS — K920 Hematemesis: Principal | ICD-10-CM | POA: Diagnosis present

## 2023-06-09 DIAGNOSIS — K5010 Crohn's disease of large intestine without complications: Secondary | ICD-10-CM | POA: Diagnosis present

## 2023-06-09 DIAGNOSIS — Z800 Family history of malignant neoplasm of digestive organs: Secondary | ICD-10-CM

## 2023-06-09 DIAGNOSIS — Z803 Family history of malignant neoplasm of breast: Secondary | ICD-10-CM

## 2023-06-09 DIAGNOSIS — I5032 Chronic diastolic (congestive) heart failure: Secondary | ICD-10-CM | POA: Diagnosis present

## 2023-06-09 DIAGNOSIS — K219 Gastro-esophageal reflux disease without esophagitis: Secondary | ICD-10-CM | POA: Diagnosis present

## 2023-06-09 DIAGNOSIS — R0789 Other chest pain: Secondary | ICD-10-CM | POA: Diagnosis not present

## 2023-06-09 DIAGNOSIS — N1831 Chronic kidney disease, stage 3a: Secondary | ICD-10-CM | POA: Diagnosis present

## 2023-06-09 DIAGNOSIS — K760 Fatty (change of) liver, not elsewhere classified: Secondary | ICD-10-CM | POA: Diagnosis present

## 2023-06-09 DIAGNOSIS — I5030 Unspecified diastolic (congestive) heart failure: Secondary | ICD-10-CM | POA: Diagnosis not present

## 2023-06-09 DIAGNOSIS — Z79899 Other long term (current) drug therapy: Secondary | ICD-10-CM | POA: Diagnosis not present

## 2023-06-09 DIAGNOSIS — Z7901 Long term (current) use of anticoagulants: Secondary | ICD-10-CM

## 2023-06-09 DIAGNOSIS — K208 Other esophagitis without bleeding: Secondary | ICD-10-CM | POA: Diagnosis not present

## 2023-06-09 DIAGNOSIS — I480 Paroxysmal atrial fibrillation: Secondary | ICD-10-CM | POA: Diagnosis present

## 2023-06-09 DIAGNOSIS — Z602 Problems related to living alone: Secondary | ICD-10-CM | POA: Diagnosis present

## 2023-06-09 DIAGNOSIS — R1310 Dysphagia, unspecified: Secondary | ICD-10-CM | POA: Diagnosis present

## 2023-06-09 DIAGNOSIS — K2101 Gastro-esophageal reflux disease with esophagitis, with bleeding: Secondary | ICD-10-CM | POA: Diagnosis present

## 2023-06-09 DIAGNOSIS — Z87820 Personal history of traumatic brain injury: Secondary | ICD-10-CM

## 2023-06-09 DIAGNOSIS — R634 Abnormal weight loss: Secondary | ICD-10-CM | POA: Diagnosis present

## 2023-06-09 DIAGNOSIS — Z9049 Acquired absence of other specified parts of digestive tract: Secondary | ICD-10-CM | POA: Diagnosis not present

## 2023-06-09 DIAGNOSIS — E785 Hyperlipidemia, unspecified: Secondary | ICD-10-CM | POA: Diagnosis present

## 2023-06-09 DIAGNOSIS — K449 Diaphragmatic hernia without obstruction or gangrene: Secondary | ICD-10-CM | POA: Diagnosis present

## 2023-06-09 DIAGNOSIS — K2100 Gastro-esophageal reflux disease with esophagitis, without bleeding: Secondary | ICD-10-CM | POA: Diagnosis not present

## 2023-06-09 HISTORY — DX: Hematemesis: K92.0

## 2023-06-09 LAB — TYPE AND SCREEN
ABO/RH(D): O POS
Antibody Screen: NEGATIVE

## 2023-06-09 LAB — COMPREHENSIVE METABOLIC PANEL
ALT: 20 U/L (ref 0–44)
AST: 27 U/L (ref 15–41)
Albumin: 2.3 g/dL — ABNORMAL LOW (ref 3.5–5.0)
Alkaline Phosphatase: 99 U/L (ref 38–126)
Anion gap: 11 (ref 5–15)
BUN: 25 mg/dL — ABNORMAL HIGH (ref 8–23)
CO2: 21 mmol/L — ABNORMAL LOW (ref 22–32)
Calcium: 8.4 mg/dL — ABNORMAL LOW (ref 8.9–10.3)
Chloride: 107 mmol/L (ref 98–111)
Creatinine, Ser: 0.91 mg/dL (ref 0.44–1.00)
GFR, Estimated: >60 mL/min (ref >60)
Glucose, Bld: 128 mg/dL — ABNORMAL HIGH (ref 70–99)
Potassium: 3.5 mmol/L (ref 3.5–5.1)
Sodium: 139 mmol/L (ref 135–145)
Total Bilirubin: 1.1 mg/dL (ref 0.0–1.2)
Total Protein: 6.6 g/dL (ref 6.5–8.1)

## 2023-06-09 LAB — CBC
HCT: 36.6 % (ref 36.0–46.0)
HCT: 34.2 % — ABNORMAL LOW (ref 36.0–46.0)
Hemoglobin: 12.1 g/dL (ref 12.0–15.0)
Hemoglobin: 11.3 g/dL — ABNORMAL LOW (ref 12.0–15.0)
MCH: 28.4 pg (ref 26.0–34.0)
MCH: 28.4 pg (ref 26.0–34.0)
MCHC: 33.1 g/dL (ref 30.0–36.0)
MCHC: 33 g/dL (ref 30.0–36.0)
MCV: 85.9 fL (ref 80.0–100.0)
MCV: 85.9 fL (ref 80.0–100.0)
Platelets: 184 10*3/uL (ref 150–400)
Platelets: 147 10*3/uL — ABNORMAL LOW (ref 150–400)
RBC: 4.26 MIL/uL (ref 3.87–5.11)
RBC: 3.98 MIL/uL (ref 3.87–5.11)
RDW: 17.3 % — ABNORMAL HIGH (ref 11.5–15.5)
RDW: 17.2 % — ABNORMAL HIGH (ref 11.5–15.5)
WBC: 8.9 10*3/uL (ref 4.0–10.5)
WBC: 8.7 10*3/uL (ref 4.0–10.5)
nRBC: 0 % (ref 0.0–0.2)
nRBC: 0 % (ref 0.0–0.2)

## 2023-06-09 LAB — POC OCCULT BLOOD, ED: Fecal Occult Bld: NEGATIVE

## 2023-06-09 MED ORDER — PANTOPRAZOLE SODIUM 40 MG IV SOLR
40.0000 mg | Freq: Two times a day (BID) | INTRAVENOUS | Status: DC
  Administered 2023-06-10 – 2023-06-11 (×3): 40 mg via INTRAVENOUS
  Filled 2023-06-09 (×4): qty 10

## 2023-06-09 MED ORDER — ONDANSETRON HCL 4 MG PO TABS: 4.0000 mg | ORAL_TABLET | Freq: Four times a day (QID) | ORAL | Status: DC | PRN

## 2023-06-09 MED ORDER — PANTOPRAZOLE SODIUM 40 MG IV SOLR
40.0000 mg | INTRAVENOUS | Status: AC
  Administered 2023-06-09 (×2): 40 mg via INTRAVENOUS
  Filled 2023-06-09: qty 10

## 2023-06-09 MED ORDER — SODIUM CHLORIDE 0.9 % IV SOLN: INTRAVENOUS | Status: DC

## 2023-06-09 MED ORDER — ONDANSETRON HCL 4 MG/2ML IJ SOLN
4.0000 mg | Freq: Four times a day (QID) | INTRAMUSCULAR | Status: DC | PRN
Start: 2023-06-09 — End: 2023-06-11
  Administered 2023-06-09: 4 mg via INTRAVENOUS
  Filled 2023-06-09: qty 2

## 2023-06-09 NOTE — H&P (Signed)
 Call Pager [phone] for any questions or notifications regarding this patient FMTS Attending Brief Admission Note: Diane Levy MD Attending pager:[phone] For details please see the complete H&P by other residents which is currently in progress. HPI: Patient not feeling well for more than a week.  Her sister has been staying with her and says she is not noticed any vomiting until today.  Patient reports discomfort in the abdominal area most of the last week.  Continues to have regular bowel movements however.  In the emergency department she was noted to vomit dark blood.  PERTINENT  PMH / PSH: I have reviewed the patient's medications, allergies, past medical and surgical history, smoking status.  Pertinent findings that relate to today's visit / issues include: History of Crohn's disease, history of esophageal stricture, has a cardiac cath, last colonoscopy 2020, has previously had EGD.  Dr. Adela Lank is her gastroenterologist.  Status post cholecystectomy.  EXAM: Patient is in no acute distress.  Her abdomen is soft with some very mild tenderness to deep palpation in the lower quadrants but no rebound or guarding, no masses noted. She is interactive, answers questions.  She is accompanied by her sister and her son.  Assessment: #1.  Abdominal discomfort 2.  Hematemesis 3.  History of Crohn's disease:  Admit, consult GI.  Hemoglobin is still within normal limits but will recheck later this evening given episode of hematemesis. Full H&P to follow

## 2023-06-09 NOTE — ED Notes (Signed)
 Assisted provider with rectal exam

## 2023-06-09 NOTE — Progress Notes (Signed)
 New Admission Note:   Arrival Method: Via stretcher from ED Mental Orientation: A & O x4 Telemetry: N/A Assessment: Completed Skin:  See skin assessment Pain: Denies Tubes:  N/A Safety Measures: Safety Fall Prevention Plan has been discussed Admission: Completed 5 MW Orientation: Patient has been orientated to the room, unit and staff.  Family:  None at bedside  Patient is from home alone.  She is independent with ADLs and drives herself to doctor appointments.  She has excellent support from her son and sister.  She does not use any home health equipment.  She has upper and lower dentures at the bedside.  She brought her clothes that she came in.  She does not have a cell phone with her.  Orders have been reviewed and implemented. Will continue to monitor the patient. Call light has been placed within reach and bed alarm has been activated.   Bernie Covey RN Phone number: [phone]

## 2023-06-09 NOTE — Plan of Care (Signed)
 FMTS Brief Progress Note  S:Night round on patient. Patient has no complaints. Denies any further episodes of vomiting or hematemesis. No abdominal pain, lightheaded or dizziness.   O: BP (!) 124/56 (BP Location: Right Arm)   Pulse 83   Temp 98 F (36.7 C) (Oral)   Resp (!) 22   Ht 5\' 3"  (1.6 m)   Wt 60.3 kg   SpO2 98%   BMI 23.55 kg/m   General: NAD, chronically ill appearing Neuro: A&O Cardiovascular: RRR, no murmurs, no peripheral edema Respiratory: normal WOB on RA, CTAB, no wheezes, ronchi or rales Abdomen: soft, NTTP, no rebound or guarding Extremities: Moving all 4 extremities equally   A/P: Hematemesis Vital signs stable, no further episodes of hematemesis. Hgb stable 12.1. -GI consulted, recs appreciated -EGD in AM -NPO at midnight -Transfusion threshold <7 -F/u CBC  - Orders reviewed. Labs for AM ordered, which was adjusted as needed.    Celine Mans, MD 06/09/2023, 9:10 PM PGY-2, [facility] Night Resident  Please page [phone] with questions.

## 2023-06-09 NOTE — ED Provider Triage Note (Signed)
 Emergency Medicine Provider Triage Evaluation Note  HOPIE PELLEGRIN , a 78 y.o. female  was evaluated in triage.  Pt complains of hematemesis. States she was drinking [facility] this morning and had 1 episode of vomiting red that her sister thought was blood and recommended she come here for evaluation. She had not had anything red to eat recently. Denies seeing any clots in the vomit. Since this one episode she has not had any symptoms and feels normal. Denies history of similar.  Review of Systems  Positive:  Negative:   Physical Exam  BP 112/74 (BP Location: Right Arm)   Pulse 77   Temp 98.2 F (36.8 C) (Oral)   Resp 15   Ht 5\' 3"  (1.6 m)   Wt 60.3 kg   SpO2 97%   BMI 23.56 kg/m  Gen:   Awake, no distress   Resp:  Normal effort  MSK:   Moves extremities without difficulty  Other:    Medical Decision Making  Medically screening exam initiated at 10:36 AM.  Appropriate orders placed.  [name] was informed that the remainder of the evaluation will be completed by another provider, this initial triage assessment does not replace that evaluation, and the importance of remaining in the ED until their evaluation is complete.     Silva Bandy, PA-C 06/09/23 1038

## 2023-06-09 NOTE — ED Triage Notes (Signed)
 Per EMS, Pt, from home, c/o hematemesis x1 episode around 800.  Denies pain.  Hx of Chron's.

## 2023-06-09 NOTE — ED Notes (Signed)
 Pt vomited dark blood. EDP made aware and pt cleaned up.

## 2023-06-09 NOTE — Consult Note (Addendum)
 Consultation  Referring Provider:  ED  Primary Care Physician:  Para March, DO Primary Gastroenterologist:  Dr. Adela Lank       Reason for Consultation:     Hematemesis  LOS: 0 days          HPI:   Diane Freeman is a 78 y.o. female with past medical history significant for Crohn's colitis (2007), traumatic SDH, hypertension, hyperlipidemia, CKD 3, paroxysmal A-fib (on Eliquis), presents for evaluation of hematemesis.  History of Crohn's colitis known by Dr. Adela Lank last seen August 2023.  On Apriso 1.5 GM/day (hair loss on Lialda).  Longstanding history of reflux and history of dysphagia s/p dilation of esophageal stricture in 2020 and history of hiatal hernia.  Last colonoscopy in 2022 showed no active inflammation.  Pseudopolyps seen.  Patient presents to ED with complaints of hematemesis.  Patient states she has had months of chest pain that feels like indigestion that is triggered by acidic foods such as tomatoes.  She is on omeprazole daily but feels this has not helped.  This morning she felt nauseous and had an episode of bright red hematemesis.  She denies the "indigestion chest pain" prior to this occurring.  She then had another episode of hematemesis in the ED with 100 cc dark or red blood also not precipitated by any type of chest pain.  Patient denies abdominal pain, change in bowel habits, melena, hematochezia. Patient has not been taking her Eliquis regularly.  Last dose reported was "months ago"   family member reports 20 pound weight loss over the last few months. Per chart review patient weighed 145 pound 12/28/2022 and is 132 today  Recent echocardiogram 06/06/2023 with EF 60 to 65%  Workup thus far Hgb 12.1 (down from 13.0 5 days ago) BUN 25, CR 0.91 Hemoccult negative  Recent admission June 2024 with sepsis due to UTI in which she developed A-fib with RVR.   PREVIOUS GI WORKUP   Colonoscopy 08/16/2020: The perianal and digital rectal  examinations were normal. - The terminal ileum appeared normal. - Diffuse pseudopolyps were found in the sigmoid colon, in the descending colon and in the transverse colon. No overtly adenomatous polyps appreciated. Multiple pseudopolyps removed. - Multiple diverticula were found in the transverse colon and left colon. - The exam was otherwise without abnormality. No active inflammation appreciated. Prep was adequate but several minutes spent lavaging the colon to achieve adequate views. - Biopsies were taken with a cold forceps from the right colon, left colon, transverse colon and rectum. These biopsy specimens were sent to Pathology.   1. Surgical [P], right colon biopsies ( h/o colitis ) - BENIGN COLONIC MUCOSA. - NO ACTIVE INFLAMMATION. - NO DYSPLASIA OR MALIGNANCY. 2. Surgical [P], colon, transverse ( h/o colitis ) - BENIGN COLONIC MUCOSA. - NO ACTIVE INFLAMMATION. - NO DYSPLASIA OR MALIGNANCY. 3. Surgical [P], left colon biopsies ( h/o colitis ) - BENIGN COLONIC MUCOSA. - NO ACTIVE INFLAMMATION. - NO DYSPLASIA OR MALIGNANCY     EGD 05/20/18 -  - A 4 cm hiatal hernia was present. - One benign-appearing, intrinsic stenosis was found 33 cm from the incisors, entering into the hernia sac. This stenosis measured less than one cm (in length). There was a subtle change in the mucosa on initial inspection which appeared to be inflamed tissue from prolapse within the hernia sac. A TTS dilator was passed through the scope. Dilation with a 16-17-18 mm balloon dilator was performed to 16 mm,  17 mm and 18 mm after which a small mucosal wrent was noted. The stricture was fairly pliable however, dysphagia could be coming from the angulated turn entering the hernia sac. Biopsies were taken with a cold forceps for histology to ensure benign. - The exam of the esophagus was otherwise normal. - The entire examined stomach was normal. - The duodenal bulb and second portion of the duodenum were  normal.   Surgical [P], stricture site - CARDIAC-TYPE MUCOSA WITH MILD NON-SPECIFIC INFLAMMATION. - NEGATIVE FOR INTESTINAL METAPLASIA, DYSPLASIA OR MALIGNANCY.       Colonoscopy 02/18/19 - The perianal and digital rectal examinations were normal. - The terminal ileum appeared normal. - Multiple medium-mouthed diverticula were found in the sigmoid colon, descending colon and transverse colon. - Patchy pseudopolyps were found in the descending colon, in the transverse colon and in the ascending colon, many of these were biopsied as below. - The exam was otherwise without abnormality. No suspected adenomas appreciated. - Biopsies were taken with a cold forceps from the right colon, left colon and transverse colon. These biopsy specimens were sent to Pathology.   1. Surgical [P], right colon - CHRONIC COLITIS WITH FOCAL ACTIVITY - NO GRANULOMATA, DYSPLASIA OR MALIGNANCY IDENTIFIED 2. Surgical [P], colon, transverse - CHRONIC MILDLY ACTIVE COLITIS - NO GRANULOMATA, DYSPLASIA OR MALIGNANCY IDENTIFIED 3. Surgical [P], left colon - CHRONIC MILDLY ACTIVE COLITIS - NO GRANULOMATA, DYSPLASIA OR MALIGNANCY IDENTIFIED     Colonoscopy 04/29/15 - mild erythema / inflammatory changes of the left colon, inflammatory pseudopolyposis from the left colon through transverse colon - surveillance biopsies taken throughout the entire colon showing mildly active colitis, One 8mm polyp removed via hot snare in the transverse colon c/w adenoma   Colonoscopy 04/26/2016 - splenic flexure adenoma, multiple transverse pseudoinflammatory polyps, normal colon otherwise - benign mucosa / quiscent colitis on path   Colonoscopy 09/03/2017 - terminal ileum normal, diverticulosis, pseudoinflammatory polyps splenic flexure and in the transverse colon, many of these were sampled with cold forceps, mild patchy erythema in the left colon. The exam was otherwise without abnormality / inflammation on direct and retroflexion  views.   EGD 09/23/2007 - mid esophageal stricture, dilated to 16mm  Past Medical History:  Diagnosis Date   Abdominal pain 12/03/2022   Adrenal nodule (HCC)    Anemia    Blood transfusion without reported diagnosis    Cataract    bil removed   Choledocholithiasis with obstruction    Colitis    Crohn's colitis (HCC) 07/11/2012   Diastolic dysfunction    a. 05/2013 Echo: EF 60-65%, no rwma, GrI DD, mild-mod TR.   Diverticulosis    Fatty liver    GERD (gastroesophageal reflux disease)    Hiatal hernia    History of esophageal stricture    History of left heart catheterization    a. 05/2013 Cath: Nl cors. Nl R heart filling pressures.  No L -> R shunt.  EF 65%.   Hx of gastritis    Lower extremity edema 12/03/2022   Palpitations    a. 09/2014 [facility]: Rare PACs/PVCs.   Pyelonephritis 05/29/2022   Right shoulder pain 12/03/2022   Sepsis (HCC) 12/03/2022   UTI (urinary tract infection) 05/14/2013    Surgical History:  She  has a past surgical history that includes Cholecystectomy (2009); Tubal ligation; Cataract extraction (Bilateral); ERCP (06/02/2011); Upper gastrointestinal endoscopy; Cardiac catheterization (05/2013); left and right heart catheterization with coronary angiogram (N/A, 06/15/2013); and Colonoscopy (last 09/03/2017, 2020). Family History:  Her family  history includes Bone cancer in her maternal grandmother; Breast cancer in her sister; Colon cancer (age of onset: 71) in her maternal aunt; Stomach cancer (age of onset: 86) in her sister. Social History:   reports that she has never smoked. She has never used smokeless tobacco. She reports that she does not drink alcohol and does not use drugs.  Prior to Admission medications   Medication Sig Start Date End Date Taking? Authorizing Provider  apixaban (ELIQUIS) 5 MG TABS tablet Take 1 tablet (5 mg total) by mouth 2 (two) times daily. 12/07/22   Alfredo Martinez, MD  Cholecalciferol (VITAMIN D-3 PO) Take 1 capsule by mouth daily.     [provider]  ferrous sulfate 325 (65 FE) MG tablet Take 1 tablet (325 mg total) by mouth every other day. 01/28/23   Everhart, Kirstie, DO  mesalamine (APRISO) 0.375 g 24 hr capsule TAKE 4 CAPSULES (1.5 G TOTAL) BY MOUTH DAILY. PLEASE SCHEDULE A FOLLOW UP VISIT FOR FURTHER REFILLS 03/29/23   Benancio Deeds, MD  nystatin cream (MYCOSTATIN) Apply to affected area 2 times daily 06/04/23   Alfredo Martinez, MD  omeprazole (PRILOSEC) 20 MG capsule TAKE 1 CAPSULE BY MOUTH DAILY. PLEASE SCHEDULE A YEARLY FOLLOW UP FOR FURTHER REFILLS. THANK YOU 01/03/23   Armbruster, Willaim Rayas, MD    Current Facility-Administered Medications  Medication Dose Route Frequency Provider Last Rate Last Admin   pantoprazole (PROTONIX) injection 40 mg  40 mg Intravenous Q5 min Linwood Dibbles, MD       Followed by   Melene Muller ON 06/10/2023] pantoprazole (PROTONIX) injection 40 mg  40 mg Intravenous Q12H Linwood Dibbles, MD       Current Outpatient Medications  Medication Sig Dispense Refill   apixaban (ELIQUIS) 5 MG TABS tablet Take 1 tablet (5 mg total) by mouth 2 (two) times daily. 60 tablet 0   Cholecalciferol (VITAMIN D-3 PO) Take 1 capsule by mouth daily.     ferrous sulfate 325 (65 FE) MG tablet Take 1 tablet (325 mg total) by mouth every other day. 90 tablet 1   mesalamine (APRISO) 0.375 g 24 hr capsule TAKE 4 CAPSULES (1.5 G TOTAL) BY MOUTH DAILY. PLEASE SCHEDULE A FOLLOW UP VISIT FOR FURTHER REFILLS 120 capsule 2   nystatin cream (MYCOSTATIN) Apply to affected area 2 times daily 30 g 0   omeprazole (PRILOSEC) 20 MG capsule TAKE 1 CAPSULE BY MOUTH DAILY. PLEASE SCHEDULE A YEARLY FOLLOW UP FOR FURTHER REFILLS. THANK YOU 30 capsule 0    Allergies as of 06/09/2023   (No Known Allergies)    Review of Systems  Constitutional:  Positive for weight loss. Negative for chills and fever.  HENT:  Negative for hearing loss and tinnitus.   Eyes:  Negative for blurred vision and double vision.  Respiratory:  Negative for  cough and hemoptysis.   Cardiovascular:  Positive for chest pain. Negative for palpitations.  Gastrointestinal:  Positive for heartburn, nausea and vomiting. Negative for abdominal pain, blood in stool, constipation, diarrhea and melena.  Genitourinary:  Negative for dysuria and urgency.  Musculoskeletal:  Negative for myalgias and neck pain.  Skin:  Negative for itching and rash.  Neurological:  Negative for seizures and loss of consciousness.  Psychiatric/Behavioral:  Negative for depression and suicidal ideas.        Physical Exam:  Vital signs in last 24 hours: Temp:  [98.2 F (36.8 C)] 98.2 F (36.8 C) (03/09 1330) Pulse Rate:  [77] 77 (03/09 1330) Resp:  [  15-22] 22 (03/09 1330) BP: (111-124)/(58-74) 124/66 (03/09 1330) SpO2:  [97 %-100 %] 100 % (03/09 1330) Weight:  [60.3 kg] 60.3 kg (03/09 1005)   Last BM recorded by nurses in past 5 days No data recorded  Physical Exam Constitutional:      Appearance: She is ill-appearing.  HENT:     Head: Normocephalic and atraumatic.     Nose: Nose normal. No congestion.  Eyes:     Extraocular Movements: Extraocular movements intact.     Conjunctiva/sclera: Conjunctivae normal.  Cardiovascular:     Rate and Rhythm: Normal rate and regular rhythm.  Pulmonary:     Effort: Pulmonary effort is normal.     Breath sounds: Normal breath sounds.  Abdominal:     General: Bowel sounds are normal. There is no distension.     Palpations: Abdomen is soft. There is no mass.     Tenderness: There is no abdominal tenderness.     Hernia: No hernia is present.  Musculoskeletal:     Cervical back: Normal range of motion and neck supple.  Skin:    General: Skin is warm and dry.  Neurological:     General: No focal deficit present.     Mental Status: She is alert and oriented to person, place, and time.  Psychiatric:        Mood and Affect: Mood normal.        Behavior: Behavior normal.        Thought Content: Thought content normal.         Judgment: Judgment normal.      LAB RESULTS: Recent Labs    06/09/23 1014  WBC 8.9  HGB 12.1  HCT 36.6  PLT 184   BMET Recent Labs    06/09/23 1014  NA 139  K 3.5  CL 107  CO2 21*  GLUCOSE 128*  BUN 25*  CREATININE 0.91  CALCIUM 8.4*   LFT Recent Labs    06/09/23 1014  PROT 6.6  ALBUMIN 2.3*  AST 27  ALT 20  ALKPHOS 99  BILITOT 1.1   PT/INR No results for input(s): "LABPROT", "INR" in the last 72 hours.  STUDIES: No results found.    Impression    Hematemesis EGD in 2020 with 4 cm hiatal hernia and appearing esophageal stenosis s/p dilation Hgb 12.1 (down from 13.0 - 5 days ago) Hemoccult negative Isolated elevated BUN of 25, normal CR and GFR 2 episodes of hematemesis with associated nausea.  Longstanding history of worsening "indigestion chest pain" ongoing for the last few months despite omeprazole.  No dysphagia.  No NSAID use except for 1 aspirin yesterday.  DDx includes esophagitis, gastritis, PUD  Crohn's colitis Colonoscopy 2022 with no active inflammation typically well-maintained on Apriso 1.5 GM/day Having regular soft formed bowel movements without blood. No abdominal pain, has been in remission for years. Less likely her symptoms are related to her Crohn's at this time.   A-fib on Eliquis Echocardiogram 06/05/2023 60 to 65% Has not been on A-fib for months   Plan   - Consider EGD tomorrow - Clear liquids today - N.p.o. midnight - PPI 40 Mg IV twice daily - I thoroughly discussed the procedure with the patient (at bedside) to include nature of the procedure, alternatives, benefits, and risks (including but not limited to bleeding, infection, perforation, anesthesia/cardiac pulmonary complications).  Patient verbalized understanding and gave verbal consent to proceed with procedure.   Thank you for your kind consultation, we will continue to  follow.   Ardys Hataway Leanna Sato  06/09/2023, 2:38 PM

## 2023-06-09 NOTE — ED Notes (Signed)
 PT states she started eating her dinner, but then felt like vomiting (but didn't).  Zofran given.

## 2023-06-09 NOTE — H&P (View-Only) (Signed)
 Consultation  Referring Provider:  ED  Primary Care Physician:  Para March, DO Primary Gastroenterologist:  Dr. Adela Lank       Reason for Consultation:     Hematemesis  LOS: 0 days          HPI:   Diane Freeman is a 78 y.o. female with past medical history significant for Crohn's colitis (2007), traumatic SDH, hypertension, hyperlipidemia, CKD 3, paroxysmal A-fib (on Eliquis), presents for evaluation of hematemesis.  History of Crohn's colitis known by Dr. Adela Lank last seen August 2023.  On Apriso 1.5 GM/day (hair loss on Lialda).  Longstanding history of reflux and history of dysphagia s/p dilation of esophageal stricture in 2020 and history of hiatal hernia.  Last colonoscopy in 2022 showed no active inflammation.  Pseudopolyps seen.  Patient presents to ED with complaints of hematemesis.  Patient states she has had months of chest pain that feels like indigestion that is triggered by acidic foods such as tomatoes.  She is on omeprazole daily but feels this has not helped.  This morning she felt nauseous and had an episode of bright red hematemesis.  She denies the "indigestion chest pain" prior to this occurring.  She then had another episode of hematemesis in the ED with 100 cc dark or red blood also not precipitated by any type of chest pain.  Patient denies abdominal pain, change in bowel habits, melena, hematochezia. Patient has not been taking her Eliquis regularly.  Last dose reported was "months ago"   family member reports 20 pound weight loss over the last few months. Per chart review patient weighed 145 pound 12/28/2022 and is 132 today  Recent echocardiogram 06/06/2023 with EF 60 to 65%  Workup thus far Hgb 12.1 (down from 13.0 5 days ago) BUN 25, CR 0.91 Hemoccult negative  Recent admission June 2024 with sepsis due to UTI in which she developed A-fib with RVR.   PREVIOUS GI WORKUP   Colonoscopy 08/16/2020: The perianal and digital rectal  examinations were normal. - The terminal ileum appeared normal. - Diffuse pseudopolyps were found in the sigmoid colon, in the descending colon and in the transverse colon. No overtly adenomatous polyps appreciated. Multiple pseudopolyps removed. - Multiple diverticula were found in the transverse colon and left colon. - The exam was otherwise without abnormality. No active inflammation appreciated. Prep was adequate but several minutes spent lavaging the colon to achieve adequate views. - Biopsies were taken with a cold forceps from the right colon, left colon, transverse colon and rectum. These biopsy specimens were sent to Pathology.   1. Surgical [P], right colon biopsies ( h/o colitis ) - BENIGN COLONIC MUCOSA. - NO ACTIVE INFLAMMATION. - NO DYSPLASIA OR MALIGNANCY. 2. Surgical [P], colon, transverse ( h/o colitis ) - BENIGN COLONIC MUCOSA. - NO ACTIVE INFLAMMATION. - NO DYSPLASIA OR MALIGNANCY. 3. Surgical [P], left colon biopsies ( h/o colitis ) - BENIGN COLONIC MUCOSA. - NO ACTIVE INFLAMMATION. - NO DYSPLASIA OR MALIGNANCY     EGD 05/20/18 -  - A 4 cm hiatal hernia was present. - One benign-appearing, intrinsic stenosis was found 33 cm from the incisors, entering into the hernia sac. This stenosis measured less than one cm (in length). There was a subtle change in the mucosa on initial inspection which appeared to be inflamed tissue from prolapse within the hernia sac. A TTS dilator was passed through the scope. Dilation with a 16-17-18 mm balloon dilator was performed to 16 mm,  17 mm and 18 mm after which a small mucosal wrent was noted. The stricture was fairly pliable however, dysphagia could be coming from the angulated turn entering the hernia sac. Biopsies were taken with a cold forceps for histology to ensure benign. - The exam of the esophagus was otherwise normal. - The entire examined stomach was normal. - The duodenal bulb and second portion of the duodenum were  normal.   Surgical [P], stricture site - CARDIAC-TYPE MUCOSA WITH MILD NON-SPECIFIC INFLAMMATION. - NEGATIVE FOR INTESTINAL METAPLASIA, DYSPLASIA OR MALIGNANCY.       Colonoscopy 02/18/19 - The perianal and digital rectal examinations were normal. - The terminal ileum appeared normal. - Multiple medium-mouthed diverticula were found in the sigmoid colon, descending colon and transverse colon. - Patchy pseudopolyps were found in the descending colon, in the transverse colon and in the ascending colon, many of these were biopsied as below. - The exam was otherwise without abnormality. No suspected adenomas appreciated. - Biopsies were taken with a cold forceps from the right colon, left colon and transverse colon. These biopsy specimens were sent to Pathology.   1. Surgical [P], right colon - CHRONIC COLITIS WITH FOCAL ACTIVITY - NO GRANULOMATA, DYSPLASIA OR MALIGNANCY IDENTIFIED 2. Surgical [P], colon, transverse - CHRONIC MILDLY ACTIVE COLITIS - NO GRANULOMATA, DYSPLASIA OR MALIGNANCY IDENTIFIED 3. Surgical [P], left colon - CHRONIC MILDLY ACTIVE COLITIS - NO GRANULOMATA, DYSPLASIA OR MALIGNANCY IDENTIFIED     Colonoscopy 04/29/15 - mild erythema / inflammatory changes of the left colon, inflammatory pseudopolyposis from the left colon through transverse colon - surveillance biopsies taken throughout the entire colon showing mildly active colitis, One 8mm polyp removed via hot snare in the transverse colon c/w adenoma   Colonoscopy 04/26/2016 - splenic flexure adenoma, multiple transverse pseudoinflammatory polyps, normal colon otherwise - benign mucosa / quiscent colitis on path   Colonoscopy 09/03/2017 - terminal ileum normal, diverticulosis, pseudoinflammatory polyps splenic flexure and in the transverse colon, many of these were sampled with cold forceps, mild patchy erythema in the left colon. The exam was otherwise without abnormality / inflammation on direct and retroflexion  views.   EGD 09/23/2007 - mid esophageal stricture, dilated to 16mm  Past Medical History:  Diagnosis Date   Abdominal pain 12/03/2022   Adrenal nodule (HCC)    Anemia    Blood transfusion without reported diagnosis    Cataract    bil removed   Choledocholithiasis with obstruction    Colitis    Crohn's colitis (HCC) 07/11/2012   Diastolic dysfunction    a. 05/2013 Echo: EF 60-65%, no rwma, GrI DD, mild-mod TR.   Diverticulosis    Fatty liver    GERD (gastroesophageal reflux disease)    Hiatal hernia    History of esophageal stricture    History of left heart catheterization    a. 05/2013 Cath: Nl cors. Nl R heart filling pressures.  No L -> R shunt.  EF 65%.   Hx of gastritis    Lower extremity edema 12/03/2022   Palpitations    a. 09/2014 Zio: Rare PACs/PVCs.   Pyelonephritis 05/29/2022   Right shoulder pain 12/03/2022   Sepsis (HCC) 12/03/2022   UTI (urinary tract infection) 05/14/2013    Surgical History:  She  has a past surgical history that includes Cholecystectomy (2009); Tubal ligation; Cataract extraction (Bilateral); ERCP (06/02/2011); Upper gastrointestinal endoscopy; Cardiac catheterization (05/2013); left and right heart catheterization with coronary angiogram (N/A, 06/15/2013); and Colonoscopy (last 09/03/2017, 2020). Family History:  Her family  history includes Bone cancer in her maternal grandmother; Breast cancer in her sister; Colon cancer (age of onset: 71) in her maternal aunt; Stomach cancer (age of onset: 86) in her sister. Social History:   reports that she has never smoked. She has never used smokeless tobacco. She reports that she does not drink alcohol and does not use drugs.  Prior to Admission medications   Medication Sig Start Date End Date Taking? Authorizing Provider  apixaban (ELIQUIS) 5 MG TABS tablet Take 1 tablet (5 mg total) by mouth 2 (two) times daily. 12/07/22   Alfredo Martinez, MD  Cholecalciferol (VITAMIN D-3 PO) Take 1 capsule by mouth daily.     [provider]  ferrous sulfate 325 (65 FE) MG tablet Take 1 tablet (325 mg total) by mouth every other day. 01/28/23   Everhart, Kirstie, DO  mesalamine (APRISO) 0.375 g 24 hr capsule TAKE 4 CAPSULES (1.5 G TOTAL) BY MOUTH DAILY. PLEASE SCHEDULE A FOLLOW UP VISIT FOR FURTHER REFILLS 03/29/23   Benancio Deeds, MD  nystatin cream (MYCOSTATIN) Apply to affected area 2 times daily 06/04/23   Alfredo Martinez, MD  omeprazole (PRILOSEC) 20 MG capsule TAKE 1 CAPSULE BY MOUTH DAILY. PLEASE SCHEDULE A YEARLY FOLLOW UP FOR FURTHER REFILLS. THANK YOU 01/03/23   Armbruster, Willaim Rayas, MD    Current Facility-Administered Medications  Medication Dose Route Frequency Provider Last Rate Last Admin   pantoprazole (PROTONIX) injection 40 mg  40 mg Intravenous Q5 min Linwood Dibbles, MD       Followed by   Melene Muller ON 06/10/2023] pantoprazole (PROTONIX) injection 40 mg  40 mg Intravenous Q12H Linwood Dibbles, MD       Current Outpatient Medications  Medication Sig Dispense Refill   apixaban (ELIQUIS) 5 MG TABS tablet Take 1 tablet (5 mg total) by mouth 2 (two) times daily. 60 tablet 0   Cholecalciferol (VITAMIN D-3 PO) Take 1 capsule by mouth daily.     ferrous sulfate 325 (65 FE) MG tablet Take 1 tablet (325 mg total) by mouth every other day. 90 tablet 1   mesalamine (APRISO) 0.375 g 24 hr capsule TAKE 4 CAPSULES (1.5 G TOTAL) BY MOUTH DAILY. PLEASE SCHEDULE A FOLLOW UP VISIT FOR FURTHER REFILLS 120 capsule 2   nystatin cream (MYCOSTATIN) Apply to affected area 2 times daily 30 g 0   omeprazole (PRILOSEC) 20 MG capsule TAKE 1 CAPSULE BY MOUTH DAILY. PLEASE SCHEDULE A YEARLY FOLLOW UP FOR FURTHER REFILLS. THANK YOU 30 capsule 0    Allergies as of 06/09/2023   (No Known Allergies)    Review of Systems  Constitutional:  Positive for weight loss. Negative for chills and fever.  HENT:  Negative for hearing loss and tinnitus.   Eyes:  Negative for blurred vision and double vision.  Respiratory:  Negative for  cough and hemoptysis.   Cardiovascular:  Positive for chest pain. Negative for palpitations.  Gastrointestinal:  Positive for heartburn, nausea and vomiting. Negative for abdominal pain, blood in stool, constipation, diarrhea and melena.  Genitourinary:  Negative for dysuria and urgency.  Musculoskeletal:  Negative for myalgias and neck pain.  Skin:  Negative for itching and rash.  Neurological:  Negative for seizures and loss of consciousness.  Psychiatric/Behavioral:  Negative for depression and suicidal ideas.        Physical Exam:  Vital signs in last 24 hours: Temp:  [98.2 F (36.8 C)] 98.2 F (36.8 C) (03/09 1330) Pulse Rate:  [77] 77 (03/09 1330) Resp:  [  15-22] 22 (03/09 1330) BP: (111-124)/(58-74) 124/66 (03/09 1330) SpO2:  [97 %-100 %] 100 % (03/09 1330) Weight:  [60.3 kg] 60.3 kg (03/09 1005)   Last BM recorded by nurses in past 5 days No data recorded  Physical Exam Constitutional:      Appearance: She is ill-appearing.  HENT:     Head: Normocephalic and atraumatic.     Nose: Nose normal. No congestion.  Eyes:     Extraocular Movements: Extraocular movements intact.     Conjunctiva/sclera: Conjunctivae normal.  Cardiovascular:     Rate and Rhythm: Normal rate and regular rhythm.  Pulmonary:     Effort: Pulmonary effort is normal.     Breath sounds: Normal breath sounds.  Abdominal:     General: Bowel sounds are normal. There is no distension.     Palpations: Abdomen is soft. There is no mass.     Tenderness: There is no abdominal tenderness.     Hernia: No hernia is present.  Musculoskeletal:     Cervical back: Normal range of motion and neck supple.  Skin:    General: Skin is warm and dry.  Neurological:     General: No focal deficit present.     Mental Status: She is alert and oriented to person, place, and time.  Psychiatric:        Mood and Affect: Mood normal.        Behavior: Behavior normal.        Thought Content: Thought content normal.         Judgment: Judgment normal.      LAB RESULTS: Recent Labs    06/09/23 1014  WBC 8.9  HGB 12.1  HCT 36.6  PLT 184   BMET Recent Labs    06/09/23 1014  NA 139  K 3.5  CL 107  CO2 21*  GLUCOSE 128*  BUN 25*  CREATININE 0.91  CALCIUM 8.4*   LFT Recent Labs    06/09/23 1014  PROT 6.6  ALBUMIN 2.3*  AST 27  ALT 20  ALKPHOS 99  BILITOT 1.1   PT/INR No results for input(s): "LABPROT", "INR" in the last 72 hours.  STUDIES: No results found.    Impression    Hematemesis EGD in 2020 with 4 cm hiatal hernia and appearing esophageal stenosis s/p dilation Hgb 12.1 (down from 13.0 - 5 days ago) Hemoccult negative Isolated elevated BUN of 25, normal CR and GFR 2 episodes of hematemesis with associated nausea.  Longstanding history of worsening "indigestion chest pain" ongoing for the last few months despite omeprazole.  No dysphagia.  No NSAID use except for 1 aspirin yesterday.  DDx includes esophagitis, gastritis, PUD  Crohn's colitis Colonoscopy 2022 with no active inflammation typically well-maintained on Apriso 1.5 GM/day Having regular soft formed bowel movements without blood. No abdominal pain, has been in remission for years. Less likely her symptoms are related to her Crohn's at this time.   A-fib on Eliquis Echocardiogram 06/05/2023 60 to 65% Has not been on A-fib for months   Plan   - Consider EGD tomorrow - Clear liquids today - N.p.o. midnight - PPI 40 Mg IV twice daily - I thoroughly discussed the procedure with the patient (at bedside) to include nature of the procedure, alternatives, benefits, and risks (including but not limited to bleeding, infection, perforation, anesthesia/cardiac pulmonary complications).  Patient verbalized understanding and gave verbal consent to proceed with procedure.   Thank you for your kind consultation, we will continue to  follow.   Ardys Hataway Leanna Sato  06/09/2023, 2:38 PM

## 2023-06-09 NOTE — Assessment & Plan Note (Signed)
 X 2 episodes thus far.  BUN 25.  Labs otherwise unremarkable including hemoglobin.  Fecal occult blood negative.  Patient also notes 20 pound weight loss over the last few months and generalized fatigue. - Admitted to [facility], MedSurg, Dr. Jennette Kettle attending - GI consulted, appreciate recommendations - Clear liquid diet today, n.p.o. at midnight in anticipation of EGD tomorrow - IV Protonix 40 mg twice daily - Consider CT abdomen pelvis - Hold Eliquis, although patient was not taking prior to arrival - SCDs for VTE prophylaxis - PT/OT eval - Trend CBCs, will obtain one for this evening

## 2023-06-09 NOTE — ED Notes (Signed)
 ED TO INPATIENT HANDOFF REPORT  ED Nurse Name and Phone #: Steward Drone [phone]  S Name/Age/Gender Diane Freeman 78 y.o. female Room/Bed: 010C/010C  Code Status   Code Status: Do not attempt resuscitation (DNR) PRE-ARREST INTERVENTIONS DESIRED  Home/SNF/Other Home Patient oriented to: self, place, time, and situation Is this baseline? Yes   Triage Complete: Triage complete  Chief Complaint Hematemesis [K92.0]  Triage Note Per EMS, Pt, from home, c/o hematemesis x1 episode around 800.  Denies pain.  Hx of Chron's.    Allergies No Known Allergies  Level of Care/Admitting Diagnosis ED Disposition     ED Disposition  Admit   Condition  --   Comment  Hospital Area: [facility] [[id number]]  Level of Care: Med-Surg [16]  May admit patient to [facility] or [facility] if equivalent level of care is available:: No  Covid Evaluation: Asymptomatic - no recent exposure (last 10 days) testing not required  Diagnosis: Hematemesis [578.0.ICD-9-CM]  Admitting Physician: Para March [[id number]]  Attending Physician: Nestor Ramp [phone]  Certification:: I certify this patient will need inpatient services for at least 2 midnights  Expected Medical Readiness: 06/11/2023          B Medical/Surgery History Past Medical History:  Diagnosis Date   Abdominal pain 12/03/2022   Adrenal nodule (HCC)    Anemia    Blood transfusion without reported diagnosis    Cataract    bil removed   Choledocholithiasis with obstruction    Colitis    Crohn's colitis (HCC) 07/11/2012   Diastolic dysfunction    a. 05/2013 Echo: EF 60-65%, no rwma, GrI DD, mild-mod TR.   Diverticulosis    Fatty liver    GERD (gastroesophageal reflux disease)    Hiatal hernia    History of esophageal stricture    History of left heart catheterization    a. 05/2013 Cath: Nl cors. Nl R heart filling pressures.  No L -> R shunt.  EF 65%.   Hx of gastritis    Lower extremity edema 12/03/2022    Palpitations    a. 09/2014 Zio: Rare PACs/PVCs.   Pyelonephritis 05/29/2022   Right shoulder pain 12/03/2022   Sepsis (HCC) 12/03/2022   UTI (urinary tract infection) 05/14/2013   Past Surgical History:  Procedure Laterality Date   CARDIAC CATHETERIZATION  05/2013   CATARACT EXTRACTION Bilateral    CHOLECYSTECTOMY  2009   COLONOSCOPY  last 09/03/2017, 2020   ERCP  06/02/2011   Procedure: ENDOSCOPIC RETROGRADE CHOLANGIOPANCREATOGRAPHY (ERCP);  Surgeon: Iva Boop, MD;  Location: [facility];  Service: Endoscopy;  Laterality: N/A;   LEFT AND RIGHT HEART CATHETERIZATION WITH CORONARY ANGIOGRAM N/A 06/15/2013   Procedure: LEFT AND RIGHT HEART CATHETERIZATION WITH CORONARY ANGIOGRAM- Shunt series;  Surgeon: Micheline Chapman, MD;  Location: [facility] CATH LAB;  Service: Cardiovascular;  Laterality: N/A;   TUBAL LIGATION     UPPER GASTROINTESTINAL ENDOSCOPY       A IV Location/Drains/Wounds Patient Lines/Drains/Airways Status     Active Line/Drains/Airways     Name Placement date Placement time Site Days   Peripheral IV 06/09/23 20 G Left Antecubital 06/09/23  1339  Antecubital  less than 1            Intake/Output Last 24 hours No intake or output data in the 24 hours ending 06/09/23 1936  Labs/Imaging Results for orders placed or performed during the hospital encounter of 06/09/23 (from the past 48 hours)  Comprehensive metabolic panel  Status: Abnormal   Collection Time: 06/09/23 10:14 AM  Result Value Ref Range   Sodium 139 135 - 145 mmol/L   Potassium 3.5 3.5 - 5.1 mmol/L   Chloride 107 98 - 111 mmol/L   CO2 21 (L) 22 - 32 mmol/L   Glucose, Bld 128 (H) 70 - 99 mg/dL    Comment: Glucose reference range applies only to samples taken after fasting for at least 8 hours.   BUN 25 (H) 8 - 23 mg/dL   Creatinine, Ser 1.30 0.44 - 1.00 mg/dL   Calcium 8.4 (L) 8.9 - 10.3 mg/dL   Total Protein 6.6 6.5 - 8.1 g/dL   Albumin 2.3 (L) 3.5 - 5.0 g/dL   AST 27 15 - 41 U/L   ALT 20 0  - 44 U/L   Alkaline Phosphatase 99 38 - 126 U/L   Total Bilirubin 1.1 0.0 - 1.2 mg/dL   GFR, Estimated >86 >57 mL/min    Comment: (NOTE) Calculated using the CKD-EPI Creatinine Equation (2021)    Anion gap 11 5 - 15    Comment: Performed at [facility], [address], [place], [place] [address]  CBC     Status: Abnormal   Collection Time: 06/09/23 10:14 AM  Result Value Ref Range   WBC 8.9 4.0 - 10.5 K/uL   RBC 4.26 3.87 - 5.11 MIL/uL   Hemoglobin 12.1 12.0 - 15.0 g/dL   HCT 29.5 28.4 - 13.2 %   MCV 85.9 80.0 - 100.0 fL   MCH 28.4 26.0 - 34.0 pg   MCHC 33.1 30.0 - 36.0 g/dL   RDW 44.0 (H) 10.2 - 72.5 %   Platelets 184 150 - 400 K/uL   nRBC 0.0 0.0 - 0.2 %    Comment: Performed at [facility], [address], [place], [place] [address]  Type and screen [facility]     Status: None   Collection Time: 06/09/23 10:14 AM  Result Value Ref Range   ABO/RH(D) O POS    Antibody Screen NEG    Sample Expiration      06/12/2023,2359 Performed at [facility], [address] [place], [place] [address]   POC occult blood, ED     Status: None   Collection Time: 06/09/23  1:37 PM  Result Value Ref Range   Fecal Occult Bld NEGATIVE NEGATIVE   No results found.  Pending Labs Unresulted Labs (From admission, onward)     Start     Ordered   06/10/23 0500  Basic metabolic panel  Tomorrow morning,   R        06/09/23 1459   06/10/23 0500  CBC  Tomorrow morning,   R        06/09/23 1459   06/09/23 1700  CBC  Once,   R        06/09/23 1532            Vitals/Pain Today's Vitals   06/09/23 1802 06/09/23 1836 06/09/23 1928 06/09/23 1929  BP: 122/75  (!) 129/107   Pulse: (!) 103     Resp: 16  19   Temp:  97.6 F (36.4 C) 98.7 F (37.1 C)   TempSrc:  Oral Oral   SpO2: 99%     Weight:      Height:      PainSc:   0-No pain 0-No pain    Isolation Precautions No active isolations  Medications Medications  pantoprazole (  PROTONIX)  injection 40 mg (40 mg Intravenous Given 06/09/23 1455)    Followed by  pantoprazole (PROTONIX) injection 40 mg (has no administration in time range)  ondansetron (ZOFRAN) tablet 4 mg ( Oral See Alternative 06/09/23 1934)    Or  ondansetron (ZOFRAN) injection 4 mg (4 mg Intravenous Given 06/09/23 1934)  0.9 %  sodium chloride infusion (has no administration in time range)    Mobility walks     Focused Assessments GI   R Recommendations: See Admitting Provider Note  Report given to:   Additional Notes: Pt ao x 4.  Given zofran for nausea.  NPO after midnight.

## 2023-06-09 NOTE — H&P (Signed)
 Hospital Admission History and Physical Service Pager: [phone]  Patient name: Diane Freeman Medical record number: [id number] Date of Birth: October 05, 1945 Age: 78 y.o. Gender: female  Primary Care Provider: Para March, DO Consultants: GI Code Status: DNR with prearrest interventions (does not want CPR but was not like intubation and ICU level care if needed)  Preferred Emergency Contact:  Contact Information     Name Relation Home Work Mobile   [place] Sister [phone]  [phone]   Gracy Racer Niece   [phone]      Other Contacts   None on File    Chief Complaint: Hematemesis  Assessment and Plan: ONIYAH ROHE is a 78 y.o. female presenting with hematemesis that began today.   Differential for presentation of this includes  Upper GI bleed-most likely given 2 episodes of hematemesis today, has long GI history including Crohn's disease, hiatal hernia, esophageal stenosis, and gastritis.  GI consulted and planning for EGD tomorrow. Malignancy-possible given approximately 20 pound weight loss since December confirmed via chart review along with fatigue over the last few months and generalized abdominal pain.  Will see what EGD shows tomorrow but could consider CT abdomen Gastritis-possible given she has history of the same Assessment & Plan Hematemesis X 2 episodes thus far.  BUN 25.  Labs otherwise unremarkable including hemoglobin.  Fecal occult blood negative.  Patient also notes 20 pound weight loss over the last few months and generalized fatigue. - Admitted to FMTS, MedSurg, Dr. Jennette Kettle attending - GI consulted, appreciate recommendations - Clear liquid diet today, n.p.o. at midnight in anticipation of EGD tomorrow - IV Protonix 40 mg twice daily - Consider CT abdomen pelvis - Hold Eliquis, although patient was not taking prior to arrival - SCDs for VTE prophylaxis - PT/OT eval - Trend CBCs, will obtain one for this evening  Chronic and  Stable Problems:  Crohn's disease-prescribed mesalamine but has not been taking, sees [facility] Paroxysmal A-fib-had A-fib with RVR during admission and 12/2022, discharged on Eliquis but patient has not been taking it for unknown amount of time Diastolic dysfunction-echo 06/06/2023 with LVEF 60 to 65%, grade 1 diastolic dysfunction, mildly dilated left atrium GERD-omeprazole 20mg  daily at home, receiving 40 mg IV Protonix twice daily here  FEN/GI: NPO until GI gives recommendations VTE Prophylaxis: SCD's, hold medication 2/2 GI bleed.  Disposition: MedSurg  History of Present Illness:  Diane Freeman is a 78 y.o. female presenting with hematemesis.  States that she had 1 episode of bright red blood in her vomit this morning so patient's sister brought her to the ED.  Denies any other symptoms including fever, chills, cough, shortness of breath, chest pain, abdominal pain, blood in stool.  Sister reports a 20 pound weight loss over the last few months and states that she has also had gradually worsening fatigue and wobbly when she walks. Patient states that she lives alone and manages her medications.  States that she is only taking her indigestion medication.  Reports she has not been taking her Eliquis for unknown amount of time.  In the ED, fecal occult blood testing negative.  CBC normal.  BUN elevated at 25, CMP otherwise unremarkable.  ED physician consulted GI, Bayley McMichael who agreed to see patient.  Patient had 1 episode of dark red/brown emesis in the ED.  Review Of Systems: Per HPI with the following additions: None  Pertinent Past Medical History: Adrenal nodule Anemia Cataracts Crohn's disease Diastolic dysfunction GERD Diverticulosis Hiatal hernia Esophageal  stricture Gastritis Hyperlipidemia A-fib  Remainder reviewed in history tab.   Pertinent Past Surgical History: Cholecystectomy 2009 Left and right heart cath 2015 ERCP 2013   Remainder reviewed in  history tab.  Pertinent Social History: Tobacco use: No Alcohol use: no Other Substance use: no Lives alone  Pertinent Family History: Sister-stomach cancer Sister-breast cancer Maternal grandmother-bone cancer Maternal aunt-colon cancer  Remainder reviewed in history tab.   Important Outpatient Medications: Eliquis 5 mg twice daily-not taking Vitamin D3-not taking Ferrous sulfate 325 mg-not taking Mesalamine 0.375 g-not taking Omeprazole 20 mg daily-taking  Remainder reviewed in medication history.   Objective: BP 136/67   Pulse 78   Temp 98.2 F (36.8 C) (Oral)   Resp (!) 25   Ht 5\' 3"  (1.6 m)   Wt 60.3 kg   SpO2 100%   BMI 23.56 kg/m  Exam: General: No acute distress, well-appearing Eyes: EOMI ENTM: Dry mucous membranes Cardiovascular: Regular rate and rhythm, no murmurs Respiratory: Breathing comfortably on room air, CTAB Gastrointestinal: Normal bowel sounds.  Diffuse TTP throughout abdomen.  Soft, nondistended.  I visualized dark red/brown emesis at bedside. MSK: No leg swelling bilateral lower extremities Neuro: Alert and oriented x 4  Labs:  CBC BMET  Recent Labs  Lab 06/09/23 1014  WBC 8.9  HGB 12.1  HCT 36.6  PLT 184   Recent Labs  Lab 06/09/23 1014  NA 139  K 3.5  CL 107  CO2 21*  BUN 25*  CREATININE 0.91  GLUCOSE 128*  CALCIUM 8.4*    Fecal occult blood negative  Ardel Jagger, DO 06/09/2023, 3:40 PM PGY-1, [facility]  FPTS Intern pager: [phone], text pages welcome Secure chat group [facility] Teaching Service

## 2023-06-09 NOTE — Hospital Course (Addendum)
 Diane Freeman is a 78 y.o. female with history of Crohn's disease, diastolic CHF, paroxysmal A-fib (not on anticoagulation), GERD, HTN, HLD who was admitted for hematemesis.  Hematemesis Hemoglobin of 12.1 on admission 3/9 with elevated BUN, dropped to 10.4 on morning of 3/10. Patient supposed to be on Eliquis 5 mg twice daily, however has not been taking this medication for an undefined amount of time. Of note patient has had significant approximate 20 pound weight loss in less than 1 year.  Patient started on IV Protonix.  GI consulted and recommended EGD.  EGD showed LA Grade D reflux esophagitis with no bleeding and with biopsy taken; also with 9 cm hiatal hernia, erythematous mucosa in the gastric body which was biopsied. Pt additionally started on sucralfate following. Hemoglobin with small drop after but overall stable with no known bleeding and patient was sent home with the following regimen: sucralfate 1 g four times a day (with meals and at bedtime) for one week, Protonix 40 mg BID until repeat EGD.  Hypokalemia Pt with K of 2.9 at 0341 on 3/10. Received 4 rounds of 10 mEq IV K repletion. Repeat K of 3.1 and so received 40 mEq x2. Also repleted Mag. Repeat K of 3.3, improved but still low so pt discharged on K supplementation with 20 mEq daily and will follow-up in a few days with PCP for repeat check.  Other chronic conditions were medically managed with home medications and formulary alternatives as necessary (hyperlipidemia, GERD)  Follow-up recommendations Recheck K, Mag with PCP; appointment scheduled on 06/14/23 at 2:30pm Consider repeat colonoscopy (significant weight loss, hematemesis, significant family hx of cancer) Will need follow-up EGD with GI in 8 weeks to check healing (~4/28) Remain on BID PPI until repeat EGD Follow-up with GI concerning surgical pathology/biopsy results MRI rescheduled for 3/14 at 12pm at [facility]

## 2023-06-09 NOTE — ED Provider Notes (Signed)
 [place] EMERGENCY DEPARTMENT AT [facility] Provider Note   CSN: [id number] Arrival date & time: 06/09/23  0865     History {Add pertinent medical, surgical, social history, OB history to HPI:1} Chief Complaint  Patient presents with   Hematemesis    Diane Freeman is a 78 y.o. female.  HPI    PT had an episode of hematemesis this am.  It occurred suddenly.  SHe had some nausea but did not have any pain or blood in her stool.  Patient noted gross blood.  Family was able to take a picture that showed me and did appear to be bright red blood.  Patient has not had episodes like this in the past.  She denies any nosebleeds.  She has not noticed any blood in her stool.  Patient is supposed to be on anticoagulation but stopped taking it a few months ago. Home Medications Prior to Admission medications   Medication Sig Start Date End Date Taking? Authorizing Provider  apixaban (ELIQUIS) 5 MG TABS tablet Take 1 tablet (5 mg total) by mouth 2 (two) times daily. 12/07/22   Alfredo Martinez, MD  Cholecalciferol (VITAMIN D-3 PO) Take 1 capsule by mouth daily.    [provider]  ferrous sulfate 325 (65 FE) MG tablet Take 1 tablet (325 mg total) by mouth every other day. 01/28/23   Everhart, Kirstie, DO  mesalamine (APRISO) 0.375 g 24 hr capsule TAKE 4 CAPSULES (1.5 G TOTAL) BY MOUTH DAILY. PLEASE SCHEDULE A FOLLOW UP VISIT FOR FURTHER REFILLS 03/29/23   Benancio Deeds, MD  nystatin cream (MYCOSTATIN) Apply to affected area 2 times daily 06/04/23   Alfredo Martinez, MD  omeprazole (PRILOSEC) 20 MG capsule TAKE 1 CAPSULE BY MOUTH DAILY. PLEASE SCHEDULE A YEARLY FOLLOW UP FOR FURTHER REFILLS. THANK YOU 01/03/23   Armbruster, Willaim Rayas, MD      Allergies    Patient has no known allergies.    Review of Systems   Review of Systems  Physical Exam Updated Vital Signs BP 124/66   Pulse 77   Temp 98.2 F (36.8 C) (Oral)   Resp (!) 22   Ht 1.6 m (5\' 3" )   Wt 60.3 kg   SpO2  100%   BMI 23.56 kg/m  Physical Exam Vitals and nursing note reviewed.  Constitutional:      General: She is not in acute distress.    Appearance: She is well-developed.  HENT:     Head: Normocephalic and atraumatic.     Right Ear: External ear normal.     Left Ear: External ear normal.  Eyes:     General: No scleral icterus.       Right eye: No discharge.        Left eye: No discharge.     Conjunctiva/sclera: Conjunctivae normal.  Neck:     Trachea: No tracheal deviation.  Cardiovascular:     Rate and Rhythm: Normal rate and regular rhythm.  Pulmonary:     Effort: Pulmonary effort is normal. No respiratory distress.     Breath sounds: Normal breath sounds. No stridor. No wheezing or rales.  Abdominal:     General: Bowel sounds are normal. There is no distension.     Palpations: Abdomen is soft.     Tenderness: There is no abdominal tenderness. There is no guarding or rebound.  Genitourinary:    Comments: No gross blood noted on rectal exam Musculoskeletal:        General:  No tenderness or deformity.     Cervical back: Neck supple.  Skin:    General: Skin is warm and dry.     Findings: No rash.  Neurological:     General: No focal deficit present.     Mental Status: She is alert.     Cranial Nerves: No cranial nerve deficit, dysarthria or facial asymmetry.     Sensory: No sensory deficit.     Motor: No abnormal muscle tone or seizure activity.     Coordination: Coordination normal.  Psychiatric:        Mood and Affect: Mood normal.     ED Results / Procedures / Treatments   Labs (all labs ordered are listed, but only abnormal results are displayed) Labs Reviewed  COMPREHENSIVE METABOLIC PANEL - Abnormal; Notable for the following components:      Result Value   CO2 21 (*)    Glucose, Bld 128 (*)    BUN 25 (*)    Calcium 8.4 (*)    Albumin 2.3 (*)    All other components within normal limits  CBC - Abnormal; Notable for the following components:   RDW 17.3  (*)    All other components within normal limits  POC OCCULT BLOOD, ED  TYPE AND SCREEN    EKG None  Radiology No results found.  Procedures Procedures  {Document cardiac monitor, telemetry assessment procedure when appropriate:1}  Medications Ordered in ED Medications - No data to display  ED Course/ Medical Decision Making/ A&P   {   Click here for ABCD2, HEART and other calculatorsREFRESH Note before signing :1}                              Medical Decision Making Amount and/or Complexity of Data Reviewed Labs: ordered.   Patient presented to the ED for an episode of acute hematemesis.  Presentation concerning for the possibility of acute upper GI bleed.  I do not see any obvious nasal oral or pharyngeal source.  Patient is currently not anemic and is hemodynamically stable.  Her BUN however is elevated compared to baseline.  This does suggest the possibility of upper GI bleeding.  With her age risk factors I think she would benefit from further evaluation including GI consultation and possible endoscopy.  I will consult with the family medicine service for admission.  Will consult with gastroenterology.  {Document critical care time when appropriate:1} {Document review of labs and clinical decision tools ie heart score, Chads2Vasc2 etc:1}  {Document your independent review of radiology images, and any outside records:1} {Document your discussion with family members, caretakers, and with consultants:1} {Document social determinants of health affecting pt's care:1} {Document your decision making why or why not admission, treatments were needed:1} Final Clinical Impression(s) / ED Diagnoses Final diagnoses:  Hematemesis with nausea    Rx / DC Orders ED Discharge Orders     None

## 2023-06-10 ENCOUNTER — Telehealth: Payer: Self-pay | Admitting: Family Medicine

## 2023-06-10 ENCOUNTER — Inpatient Hospital Stay (HOSPITAL_COMMUNITY): Admitting: Anesthesiology

## 2023-06-10 ENCOUNTER — Encounter (HOSPITAL_COMMUNITY)
Admission: EM | Disposition: A | Discharge status: Home / Self Care | Payer: Self-pay | Source: Home / Self Care | Attending: Family Medicine

## 2023-06-10 ENCOUNTER — Encounter (HOSPITAL_COMMUNITY): Payer: Self-pay | Admitting: Family Medicine

## 2023-06-10 ENCOUNTER — Ambulatory Visit (HOSPITAL_COMMUNITY)

## 2023-06-10 DIAGNOSIS — K3189 Other diseases of stomach and duodenum: Secondary | ICD-10-CM

## 2023-06-10 DIAGNOSIS — K449 Diaphragmatic hernia without obstruction or gangrene: Secondary | ICD-10-CM

## 2023-06-10 DIAGNOSIS — K2100 Gastro-esophageal reflux disease with esophagitis, without bleeding: Secondary | ICD-10-CM

## 2023-06-10 DIAGNOSIS — K920 Hematemesis: Secondary | ICD-10-CM

## 2023-06-10 DIAGNOSIS — K2101 Gastro-esophageal reflux disease with esophagitis, with bleeding: Principal | ICD-10-CM

## 2023-06-10 DIAGNOSIS — I480 Paroxysmal atrial fibrillation: Secondary | ICD-10-CM | POA: Diagnosis not present

## 2023-06-10 DIAGNOSIS — I5030 Unspecified diastolic (congestive) heart failure: Secondary | ICD-10-CM | POA: Diagnosis not present

## 2023-06-10 HISTORY — PX: HEMOSTASIS CLIP PLACEMENT: SHX6857

## 2023-06-10 HISTORY — PX: ESOPHAGOGASTRODUODENOSCOPY: SHX5428

## 2023-06-10 LAB — BASIC METABOLIC PANEL
Anion gap: 6 (ref 5–15)
Anion gap: 8 (ref 5–15)
BUN: 30 mg/dL — ABNORMAL HIGH (ref 8–23)
BUN: 25 mg/dL — ABNORMAL HIGH (ref 8–23)
CO2: 22 mmol/L (ref 22–32)
CO2: 21 mmol/L — ABNORMAL LOW (ref 22–32)
Calcium: 7.7 mg/dL — ABNORMAL LOW (ref 8.9–10.3)
Calcium: 7.9 mg/dL — ABNORMAL LOW (ref 8.9–10.3)
Chloride: 112 mmol/L — ABNORMAL HIGH (ref 98–111)
Chloride: 112 mmol/L — ABNORMAL HIGH (ref 98–111)
Creatinine, Ser: 0.87 mg/dL (ref 0.44–1.00)
Creatinine, Ser: 0.8 mg/dL (ref 0.44–1.00)
GFR, Estimated: >60 mL/min (ref >60)
GFR, Estimated: >60 mL/min (ref >60)
Glucose, Bld: 94 mg/dL (ref 70–99)
Glucose, Bld: 87 mg/dL (ref 70–99)
Potassium: 2.9 mmol/L — ABNORMAL LOW (ref 3.5–5.1)
Potassium: 3.5 mmol/L (ref 3.5–5.1)
Sodium: 140 mmol/L (ref 135–145)
Sodium: 141 mmol/L (ref 135–145)

## 2023-06-10 LAB — CBC
HCT: 31.1 % — ABNORMAL LOW (ref 36.0–46.0)
HCT: 28.9 % — ABNORMAL LOW (ref 36.0–46.0)
Hemoglobin: 10.4 g/dL — ABNORMAL LOW (ref 12.0–15.0)
Hemoglobin: 9.8 g/dL — ABNORMAL LOW (ref 12.0–15.0)
MCH: 28.3 pg (ref 26.0–34.0)
MCH: 28.7 pg (ref 26.0–34.0)
MCHC: 33.4 g/dL (ref 30.0–36.0)
MCHC: 33.9 g/dL (ref 30.0–36.0)
MCV: 84.5 fL (ref 80.0–100.0)
MCV: 84.8 fL (ref 80.0–100.0)
Platelets: 135 10*3/uL — ABNORMAL LOW (ref 150–400)
Platelets: 123 10*3/uL — ABNORMAL LOW (ref 150–400)
RBC: 3.68 MIL/uL — ABNORMAL LOW (ref 3.87–5.11)
RBC: 3.41 MIL/uL — ABNORMAL LOW (ref 3.87–5.11)
RDW: 17.3 % — ABNORMAL HIGH (ref 11.5–15.5)
RDW: 17.3 % — ABNORMAL HIGH (ref 11.5–15.5)
WBC: 6.2 10*3/uL (ref 4.0–10.5)
WBC: 6.3 10*3/uL (ref 4.0–10.5)
nRBC: 0 % (ref 0.0–0.2)
nRBC: 0 % (ref 0.0–0.2)

## 2023-06-10 SURGERY — EGD (ESOPHAGOGASTRODUODENOSCOPY)
Anesthesia: Monitor Anesthesia Care

## 2023-06-10 MED ORDER — PROPOFOL 10 MG/ML IV BOLUS
INTRAVENOUS | Status: DC | PRN
  Administered 2023-06-10: 50 mg via INTRAVENOUS

## 2023-06-10 MED ORDER — SODIUM CHLORIDE (PF) 0.9 % IJ SOLN
INTRAMUSCULAR | Status: AC
  Filled 2023-06-10: qty 10

## 2023-06-10 MED ORDER — SUCRALFATE 1 GM/10ML PO SUSP
1.0000 g | Freq: Three times a day (TID) | ORAL | Status: DC
  Administered 2023-06-10 – 2023-06-11 (×4): 1 g via ORAL
  Filled 2023-06-10 (×4): qty 10

## 2023-06-10 MED ORDER — PHENYLEPHRINE HCL (PRESSORS) 10 MG/ML IV SOLN
INTRAVENOUS | Status: DC | PRN
  Administered 2023-06-10: 80 ug via INTRAVENOUS

## 2023-06-10 MED ORDER — LIDOCAINE 2% (20 MG/ML) 5 ML SYRINGE
INTRAMUSCULAR | Status: DC | PRN
  Administered 2023-06-10: 60 mg via INTRAVENOUS

## 2023-06-10 MED ORDER — POTASSIUM CHLORIDE 10 MEQ/100ML IV SOLN
10.0000 meq | INTRAVENOUS | Status: AC
  Administered 2023-06-10 (×2): 10 meq via INTRAVENOUS
  Filled 2023-06-10 (×3): qty 100

## 2023-06-10 MED ORDER — PROPOFOL 500 MG/50ML IV EMUL
INTRAVENOUS | Status: DC | PRN
  Administered 2023-06-10: 50 ug/kg/min via INTRAVENOUS

## 2023-06-10 NOTE — Progress Notes (Signed)
 PT Cancellation Note  Patient Details Name: Diane Freeman MRN: [id number] DOB: [birth year]   Cancelled Treatment:    Reason Eval/Treat Not Completed: Patient at procedure or test/unavailable (EGD). Will check back later as time allows.   Lyanne Co, PT  Acute Rehab Services Secure chat preferred Office [phone]    Elyse Hsu 06/10/2023, 8:59 AM

## 2023-06-10 NOTE — Anesthesia Preprocedure Evaluation (Addendum)
 Anesthesia Evaluation  Patient identified by MRN, date of birth, ID band Patient awake    Reviewed: Allergy & Precautions, NPO status , Patient's Chart, lab work & pertinent test results  History of Anesthesia Complications Negative for: history of anesthetic complications  Airway Mallampati: II  TM Distance: >3 FB Neck ROM: Full    Dental  (+) Edentulous Upper, Edentulous Lower   Pulmonary neg pulmonary ROS   breath sounds clear to auscultation       Cardiovascular (-) angina  Rhythm:Regular Rate:Normal  06/06/2023 ECHO: EF 60 to 65%.  1. The LV has normal function, no regional wall motion abnormalities. Grade I diastolic dysfunction (impaired relaxation). The average left ventricular global longitudinal strain is -17.0 %.   2. RVF is normal. The right ventricular size is normal. There is normal pulmonary artery systolic pressure. The estimated right ventricular systolic pressure is 26.4 mmHg.   3. Left atrial size was mildly dilated.   4. The mitral valve is grossly normal. No evidence of mitral valve regurgitation.   5. The aortic valve was not well visualized. Aortic valve regurgitation is mild.     Neuro/Psych negative neurological ROS     GI/Hepatic Neg liver ROS, hiatal hernia,GERD  Medicated and Controlled,,Esophageal stricture Crohn's   Endo/Other  negative endocrine ROS    Renal/GU negative Renal ROS     Musculoskeletal   Abdominal   Peds  Hematology Hb 10.4, plt 135k   Anesthesia Other Findings   Reproductive/Obstetrics                             Anesthesia Physical Anesthesia Plan  ASA: 3  Anesthesia Plan: MAC   Post-op Pain Management: Minimal or no pain anticipated   Induction:   PONV Risk Score and Plan: 2 and Treatment may vary due to age or medical condition  Airway Management Planned: Nasal Cannula  Additional Equipment: None  Intra-op Plan:    Post-operative Plan:   Informed Consent: I have reviewed the patients History and Physical, chart, labs and discussed the procedure including the risks, benefits and alternatives for the proposed anesthesia with the patient or authorized representative who has indicated his/her understanding and acceptance.   Patient has DNR.  Suspend DNR.     Plan Discussed with: CRNA and Surgeon  Anesthesia Plan Comments:        Anesthesia Quick Evaluation

## 2023-06-10 NOTE — Anesthesia Postprocedure Evaluation (Signed)
 Anesthesia Post Note  Patient: Diane Freeman  Procedure(s) Performed: EGD (ESOPHAGOGASTRODUODENOSCOPY) CONTROL OF HEMORRHAGE, GI TRACT, ENDOSCOPIC, BY CLIPPING OR OVERSEWING     Patient location during evaluation: Endoscopy Anesthesia Type: MAC Level of consciousness: awake and alert, patient cooperative and oriented Vital Signs Assessment: post-procedure vital signs reviewed and stable Respiratory status: nonlabored ventilation, spontaneous breathing, respiratory function stable and patient connected to nasal cannula oxygen Cardiovascular status: blood pressure returned to baseline and stable Postop Assessment: no apparent nausea or vomiting Anesthetic complications: no   No notable events documented.  Last Vitals:  Vitals:   06/10/23 1030 06/10/23 1040  BP: 111/70 (!) 125/53  Pulse: 68 67  Resp: 17 20  Temp:    SpO2:  98%    Last Pain:  Vitals:   06/10/23 1030  TempSrc:   PainSc: 0-No pain                 Jaiveer Panas,E. Nyana Haren

## 2023-06-10 NOTE — Assessment & Plan Note (Addendum)
 Not on O2 supplementation at baseline. Pt with decreased O2 sats during EGD. Currently on 3L O2 via [place] post-EGD. Breathing well, no distress. -- Wean O2 as tolerated

## 2023-06-10 NOTE — Transfer of Care (Signed)
 Immediate Anesthesia Transfer of Care Note  Patient: Diane Freeman  Procedure(s) Performed: EGD (ESOPHAGOGASTRODUODENOSCOPY)  Patient Location: PACU and Endoscopy Unit  Anesthesia Type:MAC  Level of Consciousness: awake  Airway & Oxygen Therapy: Patient Spontanous Breathing  Post-op Assessment: Report given to RN and Post -op Vital signs reviewed and stable  Post vital signs: Reviewed and stable  Last Vitals:  Vitals Value Taken Time  BP    Temp    Pulse    Resp    SpO2      Last Pain:  Vitals:   06/10/23 0847  TempSrc: Temporal  PainSc: 0-No pain         Complications: No notable events documented.

## 2023-06-10 NOTE — Telephone Encounter (Signed)
 Spoke with patients sister. She informed me that patient is having a procedure today. And will have to reschedule her MRI and her appt with [place] tomorrow morning. Aquilla Solian, CMA

## 2023-06-10 NOTE — Interval H&P Note (Signed)
 History and Physical Interval Note:  06/10/2023 9:31 AM  Diane Freeman  has presented today for surgery, with the diagnosis of hematemesis, retrosternal chest pain, GERD, hiatal hernia.  The various methods of treatment have been discussed with the patient and family. After consideration of risks, benefits and other options for treatment, the patient has consented to  Procedure(s): EGD (ESOPHAGOGASTRODUODENOSCOPY) (N/A) as a surgical intervention.  The patient's history has been reviewed, patient examined, no change in status, stable for surgery.  I have reviewed the patient's chart and labs.  Questions were answered to the patient's satisfaction.     Jenel Lucks

## 2023-06-10 NOTE — Assessment & Plan Note (Addendum)
 Pt with K of 2.9 at 0341 on 3/10. Receiving 4 rounds of 10 mEq IV K repletion. Consider etiology of GI losses from vomiting; consider also GI losses from chronic GI disease. -- Will recheck K following repletion (~1700)

## 2023-06-10 NOTE — Op Note (Signed)
 [facility] Patient Name: Diane Freeman Procedure Date : 06/10/2023 MRN: [id number] Attending MD: Dub Amis. Tomasa Rand , MD, [id number] Date of Birth: 1946/01/11 CSN: [id number] Age: 78 Admit Type: Inpatient Procedure:                Upper GI endoscopy Indications:              Hematemesis Providers:                Lorin Picket E. Tomasa Rand, MD, Suzy Bouchard, RN, Alan Ripper, Technician Referring MD:              Medicines:                Monitored Anesthesia Care Complications:            No immediate complications. Estimated Blood Loss:     Estimated blood loss was minimal. Procedure:                Pre-Anesthesia Assessment:                           - Prior to the procedure, a History and Physical                            was performed, and patient medications and                            allergies were reviewed. The patient's tolerance of                            previous anesthesia was also reviewed. The risks                            and benefits of the procedure and the sedation                            options and risks were discussed with the patient.                            All questions were answered, and informed consent                            was obtained. Prior Anticoagulants: The patient has                            taken no anticoagulant or antiplatelet agents. ASA                            Grade Assessment: III - A patient with severe                            systemic disease. After reviewing the risks and  benefits, the patient was deemed in satisfactory                            condition to undergo the procedure.                           After obtaining informed consent, the endoscope was                            passed under direct vision. Throughout the                            procedure, the patient's blood pressure, pulse, and                            oxygen saturations  were monitored continuously. The                            [id number]-[id number] ([id number] Olympus endoscope was introduced                            through the mouth, and advanced to the second part                            of duodenum. The upper GI endoscopy was                            accomplished without difficulty. The patient                            tolerated the procedure well. Scope In: Scope Out: Findings:      LA Grade D (one or more mucosal breaks involving at least 75% of       esophageal circumference) esophagitis with no bleeding was found.       Biopsies were taken with a cold forceps for histology and to exclude       infection/malignancy. The biopsy site bled continuously, and after a       minute or so, did not demonstrate evidence of spontaneous hemostasis.       The active bleeding was then treated with hemostatic gel (Purastat).       Estimated blood loss: 50mL      The exam of the esophagus was otherwise normal.      A 9 cm hiatal hernia was present.      Patchy mildly erythematous mucosa without bleeding was found in the       gastric body. Biopsies were taken with a cold forceps for Helicobacter       pylori testing. Estimated blood loss was minimal.      The examined duodenum was normal. Impression:               - LA Grade D reflux esophagitis with no bleeding.                            Biopsied with resultant excessive bleeding from  biopsy site requiring hemostatic gel application.                            Hemostasis achieved.                           - 9 cm hiatal hernia.                           - Erythematous mucosa in the gastric body. Biopsied.                           - Normal examined duodenum. Moderate Sedation:      N/A Recommendation:           - Return patient to hospital ward for ongoing care.                           - NPO for 4 hours, then advance to clear liquids.                           - Continue BID  Protonix IV and QID Carafate.                           - Await pathology results.                           - Repeat upper endoscopy in 8 weeks to check                            healing.                           - Continue twice daily PPI until repeat EGD. Procedure Code(s):        --- Professional ---                           [id number], Esophagogastroduodenoscopy, flexible,                            transoral; with biopsy, single or multiple Diagnosis Code(s):        --- Professional ---                           K21.00, Gastro-esophageal reflux disease with                            esophagitis, without bleeding                           K44.9, Diaphragmatic hernia without obstruction or                            gangrene                           K31.89, Other diseases of stomach and duodenum  K92.0, Hematemesis CPT copyright 2022 [employer]. All rights reserved. The codes documented in this report are preliminary and upon coder review may  be revised to meet current compliance requirements. Brookelynne Dimperio E. Tomasa Rand, MD 06/10/2023 10:12:03 AM This report has been signed electronically. Number of Addenda: 0

## 2023-06-10 NOTE — Evaluation (Signed)
 Occupational Therapy Evaluation Patient Details Name: Diane Freeman MRN: [id number] DOB: 18-Jan-1946 Today's Date: 06/10/2023   History of Present Illness   78 y.o. female presents to [facility] hospital on 06/09/23 with hematemesis. EGD 3/10. PMH includes Crohns, hiatal hernia, esophageal stenosis, gastritis, Afib, GERD.     Clinical Impressions PTA, pt lives alone and typically completely Independent in all ADLs, IADLs and mobility without AD. Pt denies any falls at home. Pt presents now fairly close to reported baseline. Pt able to mobilize in room without AD and manage ADLs with no more than Setup/Supervision. Pt denied dizziness or SOB on RA with activity, unable to obtain SpO2 readings due to cold digits. Will follow acutely though feel no OT services are needed upon DC.       If plan is discharge home, recommend the following:   Other (comment) (PRN)     Functional Status Assessment   Patient has had a recent decline in their functional status and demonstrates the ability to make significant improvements in function in a reasonable and predictable amount of time.     Equipment Recommendations   None recommended by OT     Recommendations for Other Services         Precautions/Restrictions   Precautions Precautions: Fall Restrictions Weight Bearing Restrictions Per Provider Order: No     Mobility Bed Mobility Overal bed mobility: Modified Independent                  Transfers Overall transfer level: Independent Equipment used: None                      Balance Overall balance assessment: No apparent balance deficits (not formally assessed)                                         ADL either performed or assessed with clinical judgement   ADL Overall ADL's : Needs assistance/impaired Eating/Feeding: Independent   Grooming: Standing;Independent;Wash/dry face;Wash/dry hands;Brushing hair   Upper Body Bathing: Modified  independent   Lower Body Bathing: Supervison/ safety   Upper Body Dressing : Modified independent   Lower Body Dressing: Supervision/safety   Toilet Transfer: Supervision/safety;Ambulation   Toileting- Clothing Manipulation and Hygiene: Supervision/safety;Sitting/lateral lean;Sit to/from stand Toileting - Clothing Manipulation Details (indicate cue type and reason): able to manage hygiene and clothing without assist     Functional mobility during ADLs: Supervision/safety       Vision Ability to See in Adequate Light: 0 Adequate Patient Visual Report: No change from baseline Vision Assessment?: No apparent visual deficits     Perception         Praxis         Pertinent Vitals/Pain Pain Assessment Pain Assessment: No/denies pain     Extremity/Trunk Assessment Upper Extremity Assessment Upper Extremity Assessment: Overall WFL for tasks assessed;Right hand dominant   Lower Extremity Assessment Lower Extremity Assessment: Defer to PT evaluation   Cervical / Trunk Assessment Cervical / Trunk Assessment: Normal   Communication Communication Communication: No apparent difficulties   Cognition Arousal: Alert Behavior During Therapy: WFL for tasks assessed/performed Cognition: No apparent impairments                               Following commands: Intact       Cueing  General Comments  Cueing Techniques: Verbal cues;Gestural cues  Difficulty reading SpO2 due to cold digits. Pt denied any SOB on RA with activity   Exercises     Shoulder Instructions      Home Living Family/patient expects to be discharged to:: Private residence Living Arrangements: Alone Available Help at Discharge: Family;Available 24 hours/day Type of Home: Mobile home Home Access: Stairs to enter Entrance Stairs-Number of Steps: 5 Entrance Stairs-Rails: Right;Left Home Layout: One level     Bathroom Shower/Tub: Tub only;Walk-in shower   Bathroom Toilet:  Standard     Home Equipment: Agricultural consultant (2 wheels);Crutches   Additional Comments: DME from late significant other      Prior Functioning/Environment Prior Level of Function : Independent/Modified Independent;Driving             Mobility Comments: pt denies use of AD or any recent falls ADLs Comments: indep with ADLs, prefers tub soaking for bathing. Indep with IADLs, driving, grocery shopping. likes music, dancing and tossing horseshoes    OT Problem List: Decreased activity tolerance   OT Treatment/Interventions: Self-care/ADL training;Therapeutic exercise;Energy conservation;DME and/or AE instruction;Therapeutic activities;Patient/family education;Balance training      OT Goals(Current goals can be found in the care plan section)   Acute Rehab OT Goals Patient Stated Goal: have something to eat OT Goal Formulation: With patient Time For Goal Achievement: 06/24/23 Potential to Achieve Goals: Good   OT Frequency:  Min 1X/week    Co-evaluation              AM-PAC OT "6 Clicks" Daily Activity     Outcome Measure Help from another person eating meals?: None Help from another person taking care of personal grooming?: None Help from another person toileting, which includes using toliet, bedpan, or urinal?: A Little Help from another person bathing (including washing, rinsing, drying)?: A Little Help from another person to put on and taking off regular upper body clothing?: None Help from another person to put on and taking off regular lower body clothing?: A Little 6 Click Score: 21   End of Session Nurse Communication: Mobility status  Activity Tolerance: Patient tolerated treatment well Patient left: Other (comment) (to ambulate outside of room with PT)  OT Visit Diagnosis: Muscle weakness (generalized) (M62.81)                Time: 1610-9604 OT Time Calculation (min): 17 min Charges:  OT General Charges $OT Visit: 1 Visit OT Evaluation $OT Eval Low  Complexity: 1 Low  Bradd Canary, OTR/L Acute Rehab Services Office: [phone]   Lorre Munroe 06/10/2023, 1:42 PM

## 2023-06-10 NOTE — Telephone Encounter (Signed)
 Patient sister calling to let us know patient is in the hospital and will be having a procedure done today or tomorrow. Patients MRI for today will need rescheduling. Routing to team and PCP.

## 2023-06-10 NOTE — Progress Notes (Signed)
 OT Cancellation Note  Patient Details Name: Diane Freeman MRN: [id number] DOB: 18-Nov-1945   Cancelled Treatment:    Reason Eval/Treat Not Completed: Patient at procedure or test/ unavailable Off unit for EGD. Will follow up for OT eval as schedule permit.  Lorre Munroe 06/10/2023, 8:59 AM

## 2023-06-10 NOTE — Progress Notes (Addendum)
 Daily Progress Note Intern Pager: [phone]  Patient name: Diane Freeman Medical record number: [id number] Date of birth: 1945-12-07 Age: 78 y.o. Gender: female  Primary Care Provider: Para March, DO Consultants: GI Code Status: DNR with prearrest interventions (intubation, ICU level care if needed)  Pt Overview and Major Events to Date:  - 3/9: Admitted - 3/10: To get EGD per GI  Assessment and Plan:  Diane Freeman is a 78 y.o. female with PMHx of SDH (2015), pAfib, diastolic heart failure, esophageal structures, hx gastritis, Crohns, HTN, HLD presenting with hematemesis.  Differential includes upper GI bleed, most likely given hematemesis without reported hematochezia and with hx of multiple GI concerns. Also considering gastritis given hx of same with outpatient omeprazole treatment which pt reports was not very effective. Consider malignancy given ~15-20lb weight loss since end of 2024 alongside fatigue. Assessment & Plan Hematemesis Admitted for 1 episodes of hematemesis. No subsequent episodes since admission.  Initial hemoglobin 12.1 but trended to 10.4 today.  No other signs of acute bleed.  GI following and EGD completed today showed esophagitis without any active bleeding.  Continue PPI per GI rec. Denied any blood in stool but endorses intermittent black stool.  Of note FOBT on admission was negative. -- GI consulting, appreciate recommendations. -- Will advance to clear liquids diets later today -- Biopsies taken from EGD; pathology pending -- Repeat upper endoscopy in 8 weeks to check healing - IV Protonix 40 mg twice daily and Carafate 1g four times a day per post-endoscopy recommendations; pt will continue BID PPI until repeat EGD - Holding Eliquis, although patient was not taking prior to arrival; SCDs for VTE prophylaxis -- Repeat CBC at 1700 today to evaluate Hgb, then again tomorrow morning; transfusion threshold < 7 - PT/OT eval -- Recommend repeat  colonoscopy; to be scheduled with PCP post-discharge Hypokalemia Pt with K of 2.9 at 0341 on 3/10. Receiving 4 rounds of 10 mEq IV K repletion. Consider etiology of GI losses from vomiting; consider also GI losses from chronic GI disease. -- Will recheck K following repletion (~1700) Hypoxemia Not on O2 supplementation at baseline. Pt with decreased O2 sats during EGD. Currently on 3L O2 via [place] post-EGD. Breathing well, no distress. -- Wean O2 as tolerated  Of note, pt had MRI scheduled today (3/10) at 1600; scheduled by outpatient provider for left-leaning ambulation, dyspnea on exertion, concern for possible stroke (not acute). MRI team have been made aware.  Chronic and Stable Issues: Crohn's disease-prescribed mesalamine but has not been taking, sees [facility]; not receiving mesalamine during current admission Paroxysmal A-fib-had A-fib with RVR during admission and 12/2022, discharged on Eliquis but patient has not been taking it for unknown amount of time; Eliquis being held during current admission given GI bleed Diastolic dysfunction-echo 06/06/2023 with LVEF 60 to 65%, grade 1 diastolic dysfunction, mildly dilated left atrium, largely unchanged from prior; no further workup indicated this hospitalization GERD-omeprazole 20mg  daily at home, receiving 40 mg IV Protonix twice daily here; consider medications changes at discharge per GI  FEN/GI: Currently NPO for 4 hours following EGD, then advance to clear liquids PPx: SCDs; holding medication 2/2 GI bleed Dispo: Home tomorrow. Barriers include monitoring following EGD, improvement in K.   Subjective:  Overnight, pt's early morning K came back low and so was started on repletion. Otherwise, no overnight events reported.  Today, pt reports she is feeling good overall with no concerns. She is being visited currently by her sister.  She is currently on 3L O2 via [place] following EGD earlier this morning; she was not on O2 previously. She denies  any vomiting since yesterday. No nausea, abdominal pain. She has a slight headache. No chest pain, trouble breathing. She denies noticing blood in stool or dark, tarry stools; last BM was yesterday 3/9. She does report her appetite and her energy has been decreased recently, and her sister reports pt was eating around 1 meal and a snack prior to her hospitalization.  Objective: Temp:  [97.2 F (36.2 C)-98.7 F (37.1 C)] 97.5 F (36.4 C) (03/10 1202) Pulse Rate:  [64-191] 83 (03/10 1202) Resp:  [13-30] 18 (03/10 1202) BP: (77-143)/(42-107) 117/62 (03/10 1202) SpO2:  [84 %-100 %] 92 % (03/10 1202) Weight:  [60.3 kg] 60.3 kg (03/10 0847) Physical Exam: General: Pt is laying back in bed with head of bed elevated, no acute distress. Cardiovascular: Regular rate and rhythm, no acute distress.  Respiratory: Normal work of breathing. Slight expiratory rale, suspect transmitted from upper airway; otherwise, lungs clear to auscultation bilaterally. [place] present. Abdomen: Normal bowel sounds. Soft, nontender to palpation, no guarding, no rebound tenderness. Extremities: Mild bilateral edema of lower extremities. Grossly moves all 4 extremities.   Laboratory: Most recent CBC Lab Results  Component Value Date   WBC 6.2 06/10/2023   HGB 10.4 (L) 06/10/2023   HCT 31.1 (L) 06/10/2023   MCV 84.5 06/10/2023   PLT 135 (L) 06/10/2023   Most recent BMP    Latest Ref Rng & Units 06/10/2023    3:41 AM  BMP  Glucose 70 - 99 mg/dL 94   BUN 8 - 23 mg/dL 30   Creatinine 9.62 - 1.00 mg/dL 9.52   Sodium 841 - 324 mmol/L 140   Potassium 3.5 - 5.1 mmol/L 2.9   Chloride 98 - 111 mmol/L 112   CO2 22 - 32 mmol/L 22   Calcium 8.9 - 10.3 mg/dL 7.7     Endoscopy: - LA Grade D reflux esophagitis with no bleeding. Biopsied with resultant excessive bleeding from biopsy site requiring hemostatic gel application. Hemostasis achieved.  - 9 cm hiatal hernia.  - Erythematous mucosa in the gastric body. Biopsied.  -  Normal examined duodenum.   Governor Rooks, Medical Student 06/10/2023, 1:08 PM  I was personally present and re-performed the exam. I verified the service and findings are accurately documented in the student's note.  My addendum within student's notes.  Jerre Simon, MD 06/10/2023 2:36 PM   FPTS Intern pager: [phone], text pages welcome Secure chat group [facility] Teaching Service

## 2023-06-10 NOTE — Assessment & Plan Note (Addendum)
 Admitted for 1 episodes of hematemesis. No subsequent episodes since admission.  Initial hemoglobin 12.1 but trended to 10.4 today.  No other signs of acute bleed.  GI following and EGD completed today showed esophagitis without any active bleeding.  Continue PPI per GI rec. Denied any blood in stool but endorses intermittent black stool.  Of note FOBT on admission was negative. -- GI consulting, appreciate recommendations. -- Will advance to clear liquids diets later today -- Biopsies taken from EGD; pathology pending -- Repeat upper endoscopy in 8 weeks to check healing - IV Protonix 40 mg twice daily and Carafate 1g four times a day per post-endoscopy recommendations; pt will continue BID PPI until repeat EGD - Holding Eliquis, although patient was not taking prior to arrival; SCDs for VTE prophylaxis -- Repeat CBC at 1700 today to evaluate Hgb, then again tomorrow morning; transfusion threshold < 7 - PT/OT eval -- Recommend repeat colonoscopy; to be scheduled with PCP post-discharge

## 2023-06-10 NOTE — Evaluation (Signed)
 Physical Therapy Evaluation Patient Details Name: Diane Freeman MRN: [id number] DOB: [birth year] Today's Date: 06/10/2023  History of Present Illness  78 y.o. female presents to [facility] hospital on 06/09/23 with hematemesis. EGD 3/10. PMH includes Crohns, hiatal hernia, esophageal stenosis, gastritis, Afib, GERD.  Clinical Impression  Pt admitted with above diagnosis. Pt from home alone but reports her sister will come stay with her when she gets home, they help each other as needed. Pt mobilizing at supervision level in hallway and on stairs. Could not get pulse ox to read during ambulation but pt able to converse throughout and in no distress. Will follow acutely but no PT needs at d/c.  Pt currently with functional limitations due to the deficits listed below (see PT Problem List). Pt will benefit from acute skilled PT to increase their independence and safety with mobility to allow discharge.           If plan is discharge home, recommend the following: Assistance with cooking/housework   Can travel by private vehicle        Equipment Recommendations None recommended by PT  Recommendations for Other Services       Functional Status Assessment Patient has had a recent decline in their functional status and demonstrates the ability to make significant improvements in function in a reasonable and predictable amount of time.     Precautions / Restrictions Precautions Precautions: Fall Restrictions Weight Bearing Restrictions Per Provider Order: No      Mobility  Bed Mobility Overal bed mobility: Modified Independent                  Transfers Overall transfer level: Modified independent Equipment used: None               General transfer comment: no difficulty with transfers    Ambulation/Gait Ambulation/Gait assistance: Supervision Gait Distance (Feet): 300 Feet Assistive device: Rollator (4 wheels), None Gait Pattern/deviations: Step-through pattern Gait  velocity: decreased Gait velocity interpretation: 1.31 - 2.62 ft/sec, indicative of limited community ambulator   General Gait Details: ambulated 1/2 time with no AD, then let pt try out rollator which increased her gait speed. She does not need at this point, but would potentially benefit from later  Stairs Stairs: Yes Stairs assistance: Contact guard assist Stair Management: One rail Right, Alternating pattern, Forwards Number of Stairs: 4 General stair comments: cautious on stairs with need for rail but no LOB  Wheelchair Mobility     Tilt Bed    Modified Rankin (Stroke Patients Only)       Balance Overall balance assessment: No apparent balance deficits (not formally assessed)                                           Pertinent Vitals/Pain Pain Assessment Pain Assessment: No/denies pain    Home Living Family/patient expects to be discharged to:: Private residence Living Arrangements: Alone Available Help at Discharge: Family;Available 24 hours/day Type of Home: Mobile home Home Access: Stairs to enter Entrance Stairs-Rails: Right;Left Entrance Stairs-Number of Steps: 5   Home Layout: One level Home Equipment: Agricultural consultant (2 wheels);Crutches Additional Comments: DME from late significant other    Prior Function Prior Level of Function : Independent/Modified Independent;Driving             Mobility Comments: pt denies use of AD or any recent falls ADLs Comments: indep  with ADLs, prefers tub soaking for bathing. Indep with IADLs, driving, grocery shopping. likes music, dancing and tossing horseshoes     Extremity/Trunk Assessment   Upper Extremity Assessment Upper Extremity Assessment: Defer to OT evaluation    Lower Extremity Assessment Lower Extremity Assessment: Overall WFL for tasks assessed    Cervical / Trunk Assessment Cervical / Trunk Assessment: Normal  Communication   Communication Communication: No apparent  difficulties    Cognition Arousal: Alert Behavior During Therapy: WFL for tasks assessed/performed   PT - Cognitive impairments: No apparent impairments                         Following commands: Intact       Cueing Cueing Techniques: Verbal cues, Gestural cues     General Comments General comments (skin integrity, edema, etc.): could not get SPO2 reading on single pulse ox or dinamap, fingers cold, pt able to converse throughout ambulation, no distress noted    Exercises     Assessment/Plan    PT Assessment Patient needs continued PT services  PT Problem List Decreased activity tolerance;Decreased mobility       PT Treatment Interventions Gait training;Stair training;Functional mobility training;Therapeutic activities;Therapeutic exercise;Balance training    PT Goals (Current goals can be found in the Care Plan section)  Acute Rehab PT Goals Patient Stated Goal: return home PT Goal Formulation: With patient Time For Goal Achievement: 06/24/23 Potential to Achieve Goals: Good    Frequency Min 2X/week     Co-evaluation               AM-PAC PT "6 Clicks" Mobility  Outcome Measure Help needed turning from your back to your side while in a flat bed without using bedrails?: None Help needed moving from lying on your back to sitting on the side of a flat bed without using bedrails?: None Help needed moving to and from a bed to a chair (including a wheelchair)?: None Help needed standing up from a chair using your arms (e.g., wheelchair or bedside chair)?: None Help needed to walk in hospital room?: A Little Help needed climbing 3-5 steps with a railing? : A Little 6 Click Score: 22    End of Session Equipment Utilized During Treatment: Gait belt Activity Tolerance: Patient tolerated treatment well Patient left: in bed;with call bell/phone within reach;with bed alarm set Nurse Communication: Mobility status PT Visit Diagnosis: Difficulty in walking,  not elsewhere classified (R26.2)    Time: 8469-6295 PT Time Calculation (min) (ACUTE ONLY): 16 min   Charges:   PT Evaluation $PT Eval Moderate Complexity: 1 Mod   PT General Charges $$ ACUTE PT VISIT: 1 Visit         Lyanne Co, PT  Acute Rehab Services Secure chat preferred Office [phone]   Lawana Chambers Edmundo Tedesco 06/10/2023, 2:35 PM

## 2023-06-10 NOTE — Plan of Care (Signed)

## 2023-06-11 ENCOUNTER — Ambulatory Visit: Payer: Self-pay | Admitting: Student

## 2023-06-11 ENCOUNTER — Encounter (HOSPITAL_COMMUNITY): Payer: Self-pay | Admitting: Gastroenterology

## 2023-06-11 ENCOUNTER — Other Ambulatory Visit (HOSPITAL_COMMUNITY): Payer: Self-pay

## 2023-06-11 ENCOUNTER — Telehealth: Payer: Self-pay | Admitting: *Deleted

## 2023-06-11 DIAGNOSIS — K209 Esophagitis, unspecified without bleeding: Secondary | ICD-10-CM

## 2023-06-11 DIAGNOSIS — K5010 Crohn's disease of large intestine without complications: Secondary | ICD-10-CM

## 2023-06-11 DIAGNOSIS — K920 Hematemesis: Secondary | ICD-10-CM | POA: Diagnosis not present

## 2023-06-11 DIAGNOSIS — K2100 Gastro-esophageal reflux disease with esophagitis, without bleeding: Secondary | ICD-10-CM | POA: Diagnosis not present

## 2023-06-11 HISTORY — DX: Esophagitis, unspecified without bleeding: K20.90

## 2023-06-11 LAB — CBC
HCT: 27.6 % — ABNORMAL LOW (ref 36.0–46.0)
Hemoglobin: 9.3 g/dL — ABNORMAL LOW (ref 12.0–15.0)
MCH: 28.4 pg (ref 26.0–34.0)
MCHC: 33.7 g/dL (ref 30.0–36.0)
MCV: 84.4 fL (ref 80.0–100.0)
Platelets: 108 10*3/uL — ABNORMAL LOW (ref 150–400)
RBC: 3.27 MIL/uL — ABNORMAL LOW (ref 3.87–5.11)
RDW: 17.3 % — ABNORMAL HIGH (ref 11.5–15.5)
WBC: 4.7 10*3/uL (ref 4.0–10.5)
nRBC: 0 % (ref 0.0–0.2)

## 2023-06-11 LAB — BASIC METABOLIC PANEL
Anion gap: 9 (ref 5–15)
Anion gap: 7 (ref 5–15)
BUN: 22 mg/dL (ref 8–23)
BUN: 19 mg/dL (ref 8–23)
CO2: 20 mmol/L — ABNORMAL LOW (ref 22–32)
CO2: 22 mmol/L (ref 22–32)
Calcium: 7.8 mg/dL — ABNORMAL LOW (ref 8.9–10.3)
Calcium: 7.9 mg/dL — ABNORMAL LOW (ref 8.9–10.3)
Chloride: 111 mmol/L (ref 98–111)
Chloride: 112 mmol/L — ABNORMAL HIGH (ref 98–111)
Creatinine, Ser: 0.88 mg/dL (ref 0.44–1.00)
Creatinine, Ser: 1.04 mg/dL — ABNORMAL HIGH (ref 0.44–1.00)
GFR, Estimated: >60 mL/min (ref >60)
GFR, Estimated: 55 mL/min — ABNORMAL LOW (ref >60)
Glucose, Bld: 103 mg/dL — ABNORMAL HIGH (ref 70–99)
Glucose, Bld: 117 mg/dL — ABNORMAL HIGH (ref 70–99)
Potassium: 3.1 mmol/L — ABNORMAL LOW (ref 3.5–5.1)
Potassium: 3.3 mmol/L — ABNORMAL LOW (ref 3.5–5.1)
Sodium: 140 mmol/L (ref 135–145)
Sodium: 141 mmol/L (ref 135–145)

## 2023-06-11 LAB — SURGICAL PATHOLOGY

## 2023-06-11 LAB — MAGNESIUM: Magnesium: 1.6 mg/dL — ABNORMAL LOW (ref 1.7–2.4)

## 2023-06-11 MED ORDER — MAGNESIUM SULFATE IN D5W 1-5 GM/100ML-% IV SOLN
1.0000 g | Freq: Once | INTRAVENOUS | Status: DC
  Filled 2023-06-11: qty 100

## 2023-06-11 MED ORDER — PANTOPRAZOLE SODIUM 40 MG PO TBEC
40.0000 mg | DELAYED_RELEASE_TABLET | Freq: Two times a day (BID) | ORAL | 1 refills | Status: DC
  Filled 2023-06-11: qty 30, 15d supply, fill #0

## 2023-06-11 MED ORDER — MAGNESIUM OXIDE -MG SUPPLEMENT 400 (240 MG) MG PO TABS
400.0000 mg | ORAL_TABLET | Freq: Once | ORAL | Status: AC
  Administered 2023-06-11: 400 mg via ORAL
  Filled 2023-06-11: qty 1

## 2023-06-11 MED ORDER — POTASSIUM CHLORIDE 20 MEQ PO PACK
40.0000 meq | PACK | Freq: Two times a day (BID) | ORAL | Status: AC
  Administered 2023-06-11 (×2): 40 meq via ORAL
  Filled 2023-06-11 (×2): qty 2

## 2023-06-11 MED ORDER — PANTOPRAZOLE SODIUM 40 MG PO TBEC
40.0000 mg | DELAYED_RELEASE_TABLET | Freq: Every day | ORAL | 1 refills | Status: DC
Start: 2023-06-11 — End: 2023-06-11
  Filled 2023-06-11: qty 30, 30d supply, fill #0

## 2023-06-11 MED ORDER — SUCRALFATE 1 GM/10ML PO SUSP
1.0000 g | Freq: Three times a day (TID) | ORAL | 0 refills | Status: DC
  Filled 2023-06-11: qty 280, 7d supply, fill #0

## 2023-06-11 MED ORDER — MESALAMINE ER 0.375 G PO CP24
1.5000 g | ORAL_CAPSULE | Freq: Every day | ORAL | 2 refills | Status: DC
  Filled 2023-06-11: qty 120, 30d supply, fill #0

## 2023-06-11 MED ORDER — ONDANSETRON HCL 4 MG PO TABS
4.0000 mg | ORAL_TABLET | Freq: Four times a day (QID) | ORAL | 0 refills | Status: DC | PRN
  Filled 2023-06-11: qty 20, 5d supply, fill #0

## 2023-06-11 NOTE — Progress Notes (Addendum)
 Daily Progress Note Intern Pager: [phone]  Patient name: Diane Freeman Medical record number: [id number] Date of birth: [birth year] Age: 78 y.o. Gender: female  Primary Care Provider: Para March, DO Consultants: GI Code Status: DNR with prearrest interventions (intubation, ICU level care if needed)   Pt Overview and Major Events to Date:  - 3/9: Admitted  - 3/10: EGD completed  Assessment and Plan:  Diane Freeman is a 78 y.o. female with PMHx significant for SDH (2015), pAfib, diastolic heart failure, esophageal structures, hx gastritis, Crohns, HTN, HLD presenting with hematemesis.  Hematemesis currently resolved, hemoglobin stable with no other signs of bleeding.  Plan for further outpatient follow-up with GI.  Regarding weight loss, unclear etiology we will continue to follow-up outpatient. Assessment & Plan Hematemesis Admitted for 1 episode of hematemesis with 1 subsequent episode in ED; none following. Initial hemoglobin 12.1, downtrended to 9.3, now stable. EGD done 3/10 with no active bleeding; did show esophagitis. Continue PPI per GI recommendations. Denied any blood in stool but endorses intermittent black stool. Of note, FOBT on admission was negative. -- GI consulting, appreciate recommendations. -- On clear liquid diet; will advance given no overt bleeding -- Biopsies taken from EGD; pathology pending -- Repeat upper endoscopy in 8 weeks to check healing - IV Protonix 40 mg twice daily and Carafate 1g four times a day per post-endoscopy recommendations; pt will continue BID PPI until repeat EGD - Holding Eliquis, although patient was not taking prior to arrival; SCDs for VTE prophylaxis -- Stable Hgb, no indication for transfusion; transfusion threshold < 7 -- Recommend repeat colonoscopy; to be scheduled with PCP post-discharge Hypokalemia K of 3.1 3/11 at 0340, repleted with 40 mEq x2. Consider GI losses from chronic GI disease. Mag also low at  1.6. -- Replete Mag with 1 g IV -- Will recheck K following repletion (~1200) -- Discharge with K supplementation of 20 mEq daily   Chronic and Stable Issues: Crohn's disease: Prescribed mesalamine but has not been taking, sees [facility]; not receiving mesalamine during current admission, consider restarting at discharge Paroxysmal A-fib: Prior A-fib with RVR during admission and 12/2022, discharged on Eliquis but patient has not been taking it for unknown amount of time; Eliquis being held during current admission given GI bleed GERD: Omeprazole 20mg  daily at home, receiving 40 mg IV Protonix twice daily here; GI recommending twice daily PPI until repeat EGD in 8 weeks. Diastolic dysfunction: Echo 06/06/2023 with LVEF 60 to 65%, grade 1 diastolic dysfunction, mildly dilated left atrium, largely unchanged from prior; no further workup indicated this hospitalization.  FEN/GI: Progress to regular diet PPx: SCDs; holding medication 2/2 GI bleed  Dispo:Home today. Barriers include none.   Subjective:  No overnight events.  Today, pt reports she is feeling okay overall with no concerns or complaints. Has not had any hematemesis since admission. Denies any bleeding following EGD; no cough, no spitting up blood. Denies abdominal pain, N/V. Denies chest pain, SOB, headache, chills, fever.  Objective: Temp:  [97.5 F (36.4 C)-98.6 F (37 C)] 97.7 F (36.5 C) (03/11 0851) Pulse Rate:  [72-83] 78 (03/11 0851) Resp:  [16-18] 18 (03/11 0851) BP: (117-162)/(59-69) 162/69 (03/11 0851) SpO2:  [91 %-100 %] 100 % (03/11 0851) Physical Exam: General: Pt is lying down in bed with head of bed elevated, no acute distress. Cardiovascular: Regular rate and rhythm, no murmurs/rubs/gallops. Respiratory: Normal work of breathing. Lungs clear to auscultation bilaterally. Abdomen: Bowel sounds normal. Abdomen soft, no  masses, no distention; no tenderness to palpation, no rebound, no guarding. Extremities:  Grossly normal ROM in all extremities. Minimal edema of bilateral lower extremities.  Laboratory: Most recent CBC Lab Results  Component Value Date   WBC 4.7 06/11/2023   HGB 9.3 (L) 06/11/2023   HCT 27.6 (L) 06/11/2023   MCV 84.4 06/11/2023   PLT 108 (L) 06/11/2023   Most recent BMP    Latest Ref Rng & Units 06/11/2023    3:46 AM  BMP  Glucose 70 - 99 mg/dL 161   BUN 8 - 23 mg/dL 22   Creatinine 0.96 - 1.00 mg/dL 0.45   Sodium 409 - 811 mmol/L 140   Potassium 3.5 - 5.1 mmol/L 3.1   Chloride 98 - 111 mmol/L 111   CO2 22 - 32 mmol/L 20   Calcium 8.9 - 10.3 mg/dL 7.8   Mag: 1.6   [place], Marchelle Folks, Medical Student 06/11/2023, 11:08 AM  I was personally present and performed or re-performed the history, physical exam and medical decision making activities of this service and have verified that the service and findings are accurately documented in the student's note.  Tiffany Kocher, DO                  06/11/2023, 12:23 PM  FPTS Intern pager: [phone], text pages welcome Secure chat group [facility] Teaching Service

## 2023-06-11 NOTE — Progress Notes (Signed)
 Mobility Specialist Progress Note:    06/11/23 0900  Mobility  Activity Ambulated with assistance in hallway  Level of Assistance Contact guard assist, steadying assist  Assistive Device Other (Comment) (IV Pole)  Distance Ambulated (ft) 180 ft  Activity Response Tolerated well  Mobility Referral Yes  Mobility visit 1 Mobility  Mobility Specialist Start Time (ACUTE ONLY) 0845  Mobility Specialist Stop Time (ACUTE ONLY) 0856  Mobility Specialist Time Calculation (min) (ACUTE ONLY) 11 min   Received pt in bed having no complaints and agreeable to mobility. Pt was asymptomatic throughout ambulation and returned to room w/o fault. Left in bed w/ call bell in reach and all needs met.   [place] Earlene Plater Mobility Specialist Please contact via Special educational needs teacher or Rehab office at [phone]

## 2023-06-11 NOTE — Plan of Care (Signed)
  Problem: Education: Goal: Knowledge of risk factors and measures for prevention of condition will improve Outcome: Progressing   Problem: Coping: Goal: Psychosocial and spiritual needs will be supported Outcome: Progressing   Problem: Respiratory: Goal: Will maintain a patent airway Outcome: Progressing Goal: Complications related to the disease process, condition or treatment will be avoided or minimized Outcome: Progressing   Problem: Education: Goal: Ability to describe self-care measures that may prevent or decrease complications (Diabetes Survival Skills Education) will improve Outcome: Progressing Goal: Individualized Educational Video(s) Outcome: Progressing   Problem: Coping: Goal: Ability to adjust to condition or change in health will improve Outcome: Progressing   Problem: Fluid Volume: Goal: Ability to maintain a balanced intake and output will improve Outcome: Progressing   Problem: Health Behavior/Discharge Planning: Goal: Ability to identify and utilize available resources and services will improve Outcome: Progressing Goal: Ability to manage health-related needs will improve Outcome: Progressing   Problem: Metabolic: Goal: Ability to maintain appropriate glucose levels will improve Outcome: Progressing   Problem: Nutritional: Goal: Maintenance of adequate nutrition will improve Outcome: Progressing Goal: Progress toward achieving an optimal weight will improve Outcome: Progressing   Problem: Skin Integrity: Goal: Risk for impaired skin integrity will decrease Outcome: Progressing   Problem: Tissue Perfusion: Goal: Adequacy of tissue perfusion will improve Outcome: Progressing   

## 2023-06-11 NOTE — Assessment & Plan Note (Addendum)
 Admitted for 1 episode of hematemesis with 1 subsequent episode in ED; none following. Initial hemoglobin 12.1, downtrended to 9.3, now stable. EGD done 3/10 with no active bleeding; did show esophagitis. Continue PPI per GI recommendations. Denied any blood in stool but endorses intermittent black stool. Of note, FOBT on admission was negative. -- GI consulting, appreciate recommendations. -- On clear liquid diet; will advance given no overt bleeding -- Biopsies taken from EGD; pathology pending -- Repeat upper endoscopy in 8 weeks to check healing - IV Protonix 40 mg twice daily and Carafate 1g four times a day per post-endoscopy recommendations; pt will continue BID PPI until repeat EGD - Holding Eliquis, although patient was not taking prior to arrival; SCDs for VTE prophylaxis -- Stable Hgb, no indication for transfusion; transfusion threshold < 7 -- Recommend repeat colonoscopy; to be scheduled with PCP post-discharge

## 2023-06-11 NOTE — Telephone Encounter (Signed)
-----   Message from Benancio Deeds sent at 06/11/2023 12:33 PM EDT ----- Regarding: RE: Hospital follow up Thanks Scott. I agree. Jan or Karen Kitchens can you help book direct EGD with me in the [facility] in 8 weeks or so? Can be done while on blood thinner if she resumes it at some point (she is supposed to take one but has been noncompliant). Thanks ----- Message ----- From: Jenel Lucks, MD Sent: 06/11/2023  10:40 AM EDT To: Benancio Deeds, MD; Cooper Render, CMA Subject: Hospital follow up                             SA,  Diane Freeman had severe erosive esophagitis on her EGD yesterday.  She experienced some bleeding following the biopsy, but did well overnight and is being discharged today.  We are sending her out on Protonix twice daily until repeat EGD and Carafate for 1 week.  I recommend she have a repeat upper endoscopy in about 8 weeks to assess healing of her esophagitis.  I think she should be okay for a direct EGD in the [facility], but I defer to you.  Jan,  Can you get her set up for a repeat EGD in 8 weeks or so, if Dr. Adela Lank agrees?  [place]

## 2023-06-11 NOTE — Telephone Encounter (Signed)
 Patient has been scheduled for endoscopy in [facility] on Monday, 08/05/23 at 9 am arrival time. She also also been scheduled for a telephone previsit for instructions on Friday, 07/12/23 at 1230 pm.  Patient is currently hospitalized. Will reach out to her at a later time with this information.

## 2023-06-11 NOTE — Progress Notes (Signed)
 [facility] ROUNDING NOTE   Subjective: Patient feels well this morning.  She denies any abdominal pain, nausea/vomiting.  She had a bowel movement today which she said was normal, brown in color.   Objective: Vital signs in last 24 hours: Temp:  [97.5 F (36.4 C)-98.6 F (37 C)] 97.7 F (36.5 C) (03/11 0851) Pulse Rate:  [72-83] 78 (03/11 0851) Resp:  [16-18] 18 (03/11 0851) BP: (117-162)/(59-69) 162/69 (03/11 0851) SpO2:  [91 %-100 %] 100 % (03/11 0851) Last BM Date : 06/11/23 General: NAD, pleasant Caucasian elderly female Abdomen:  Soft, NT, ND, +BS     Intake/Output from previous day: 03/10 0701 - 03/11 0700 In: 823.3 [P.O.:240; I.V.:583.3] Out: -  Intake/Output this shift: No intake/output data recorded.   Lab Results: Recent Labs    06/10/23 0341 06/10/23 1935 06/11/23 0346  WBC 6.2 6.3 4.7  HGB 10.4* 9.8* 9.3*  PLT 135* 123* 108*  MCV 84.5 84.8 84.4   BMET Recent Labs    06/10/23 0341 06/10/23 1935 06/11/23 0346  NA 140 141 140  K 2.9* 3.5 3.1*  CL 112* 112* 111  CO2 22 21* 20*  GLUCOSE 94 87 103*  BUN 30* 25* 22  CREATININE 0.87 0.80 0.88  CALCIUM 7.7* 7.9* 7.8*   LFT Recent Labs    06/09/23 1014  PROT 6.6  ALBUMIN 2.3*  AST 27  ALT 20  ALKPHOS 99  BILITOT 1.1   PT/INR No results for input(s): "INR" in the last 72 hours.    Imaging/Other results: No results found.    Assessment and Plan:  78 year old female with history of Crohn's colitis in remission, admitted with hematemesis, found to have severe erosive esophagitis, which was the source of her hematemesis.  She had persistent bleeding following biopsies yesterday, which was treated with hemostatic gel, but her hemoglobin remained stable, her BUN is downtrending and she has not had any evidence of overt bleeding. Agree with plans for discharge today.  Recommend patient be discharged on twice daily PPI until repeat EGD.  Recommend 1 week of Carafate.  Will  arrange for outpatient to repeat EGD to assess healing of esophagitis with Dr. Adela Lank in about 8 weeks.  GERD with erosive esophagitis and hemorrhage - Okay for discharge home today from GI standpoint - Please discharge with pantoprazole 40 mg twice daily until repeat EGD - Continue Carafate suspension 1 g 4 times daily for 1 week - Will arrange outpatient follow-up EGD in 8 weeks with Dr. Adela Lank -Okay to resume Eliquis in 1-2 days, if planning to resume anticoagulation for a-fib  Crohn's colitis, in remission - Continue Apriso 1.5 g/day   Jenel Lucks, MD  06/11/2023, 10:40 AM [facility]

## 2023-06-11 NOTE — Discharge Instructions (Addendum)
 [name],  You were admitted to the [facility] Medicine Teaching Service in the hospital for bloody vomiting. You underwent workup, including an EGD (a scope of your upper GI tract) which showed some irritation of your esophagus and your stomach; biopsies were taken from these sites, and results are not yet back. You will be notified of these results when they are back.   You also had low potassium levels during your stay, and received potassium to get the levels back up.   There were medication changes made during your hospitalization: - Continue Protonix/pantoprazole 40 mg two times a day to help suppress stomach acid; you will take this until you have a repeat EGD - Continue sucralfate 1 g four times a day (with meals and at bedtime) to help protect your stomach against acid; you will take this for one week (until 06/18/23) - Start potassium supplementation with K-Cl 20 mEq once a day - Start back on your mesalamine 4 capsules (1.5 g total) a day to help manage your Crohn's disease - Take your other medications as instructed!  There are a few things for you to do after leaving the hospital: - Follow-up with your primary care physician; you have an appointment scheduled at the [facility] on 06/14/23 at 2:30pm - You should get a repeat colonoscopy; your primary care physician can help set this up - You will get a repeat EGD with the GI team in around 8 weeks; they will call you to schedule this - Your MRI, which you had to miss while you were in the hospital, was rescheduled for 06/14/23 at 12pm at [facility]  If your symptoms worsen or return, please call your primary care physician or return to the hospital. Please let us know if you have questions about your stay at [facility].  Thank you for allowing [place] to be a part of your care team! Governor Rooks, medical student Dr. Sharmon Leyden Dr. Jerre Simon Dr. Denny Levy

## 2023-06-11 NOTE — Plan of Care (Signed)
  Problem: Health Behavior/Discharge Planning: Goal: Ability to manage health-related needs will improve Outcome: Adequate for Discharge   Problem: Clinical Measurements: Goal: Ability to maintain clinical measurements within normal limits will improve Outcome: Adequate for Discharge Goal: Will remain free from infection Outcome: Adequate for Discharge Goal: Diagnostic test results will improve Outcome: Adequate for Discharge Goal: Respiratory complications will improve Outcome: Adequate for Discharge Goal: Cardiovascular complication will be avoided Outcome: Adequate for Discharge   Problem: Activity: Goal: Risk for activity intolerance will decrease Outcome: Adequate for Discharge   Problem: Nutrition: Goal: Adequate nutrition will be maintained Outcome: Adequate for Discharge   Problem: Coping: Goal: Level of anxiety will decrease Outcome: Adequate for Discharge   Problem: Elimination: Goal: Will not experience complications related to bowel motility Outcome: Adequate for Discharge Goal: Will not experience complications related to urinary retention Outcome: Adequate for Discharge   Problem: Pain Managment: Goal: General experience of comfort will improve and/or be controlled Outcome: Adequate for Discharge   Problem: Safety: Goal: Ability to remain free from injury will improve Outcome: Adequate for Discharge   Problem: Skin Integrity: Goal: Risk for impaired skin integrity will decrease Outcome: Adequate for Discharge

## 2023-06-11 NOTE — Assessment & Plan Note (Addendum)
 K of 3.1 3/11 at 0340, repleted with 40 mEq x2. Consider GI losses from chronic GI disease. Mag also low at 1.6. -- Replete Mag with 1 g IV -- Will recheck K following repletion (~1200) -- Discharge with K supplementation of 20 mEq daily

## 2023-06-11 NOTE — TOC Transition Note (Signed)
 Transition of Care [facility]) - Discharge Note   Patient Details  Name: Diane Freeman MRN: [id number] Date of Birth: 1945-08-02  Transition of Care [facility] CM/SW Contact:  Tom-Johnson, Hershal Coria, RN Phone Number: 06/11/2023, 11:25 AM   Clinical Narrative:     Patient is scheduled for discharge today.  Readmission Risk Assessment done. Outpatient f/u, hospital f/u and discharge instructions on AVS. Prescriptions sent to [facility] pharmacy and patient will receive meds prior discharge. No PT/OT f/u, no TOC needs or recommendations noted. Sister, Alexia Freestone to transport at discharge.  No further TOC needs noted.       Final next level of care: Home/Self Care Barriers to Discharge: Barriers Resolved   Patient Goals and CMS Choice Patient states their goals for this hospitalization and ongoing recovery are:: To return home CMS Medicare.gov Compare Post Acute Care list provided to:: Patient Choice offered to / list presented to : NA      Discharge Placement                Patient to be transferred to facility by: Sister Name of family member notified: Patty    Discharge Plan and Services Additional resources added to the After Visit Summary for                  DME Arranged: N/A DME Agency: NA       HH Arranged: NA HH Agency: NA        Social Drivers of Health (SDOH) Interventions SDOH Screenings   Food Insecurity: No Food Insecurity (06/09/2023)  Housing: Low Risk  (06/09/2023)  Transportation Needs: No Transportation Needs (06/09/2023)  Utilities: Not At Risk (06/09/2023)  Alcohol Screen: Low Risk  (12/31/2022)  Depression (PHQ2-9): High Risk (06/04/2023)  Financial Resource Strain: Low Risk  (12/31/2022)  Physical Activity: Insufficiently Active (12/31/2022)  Social Connections: Moderately Isolated (06/09/2023)  Stress: No Stress Concern Present (12/31/2022)  Tobacco Use: Low Risk  (06/10/2023)  Health Literacy: Adequate Health Literacy (12/31/2022)     Readmission  Risk Interventions    06/11/2023   11:24 AM  Readmission Risk Prevention Plan  Post Dischage Appt Complete  Medication Screening Complete  Transportation Screening Complete

## 2023-06-11 NOTE — Discharge Summary (Addendum)
 Family Medicine Teaching [facility] Discharge Summary  Patient name: Diane Freeman Medical record number: [id number] Date of birth: [birth year] Age: 78 y.o. Gender: female Date of Admission: 06/09/2023  Date of Discharge: 06/11/2023 Admitting Physician: Para March, DO  Primary Care Provider: Para March, DO Consultants: GI  Indication for Hospitalization: Hematemesis   Brief Hospital Course:  Diane Freeman is a 78 y.o. female with history of Crohn's disease, diastolic CHF, paroxysmal A-fib (not on anticoagulation), GERD, HTN, HLD who was admitted for hematemesis.  Hematemesis Hemoglobin of 12.1 on admission 3/9 with elevated BUN, dropped to 10.4 on morning of 3/10. Patient supposed to be on Eliquis 5 mg twice daily, however has not been taking this medication for an undefined amount of time. Of note patient has had significant approximate 20 pound weight loss in less than 1 year.  Patient started on IV Protonix.  GI consulted and recommended EGD.  EGD showed LA Grade D reflux esophagitis with no bleeding and with biopsy taken; also with 9 cm hiatal hernia, erythematous mucosa in the gastric body which was biopsied. Pt additionally started on sucralfate following. Hemoglobin with small drop after but overall stable with no known bleeding and patient was sent home with the following regimen: sucralfate 1 g four times a day (with meals and at bedtime) for one week, Protonix 40 mg BID until repeat EGD.  Hypokalemia Pt with K of 2.9 at 0341 on 3/10. Received 4 rounds of 10 mEq IV K repletion. Repeat K of 3.1 and so received 40 mEq x2. Also repleted Mag. Repeat K of 3.3, improved but still low so pt discharged on K supplementation with 20 mEq daily and will follow-up in a few days with PCP for repeat check.  Other chronic conditions were medically managed with home medications and formulary alternatives as necessary (hyperlipidemia, GERD)  Follow-up recommendations Recheck  K, Mag with PCP; appointment scheduled on 06/14/23 at 2:30pm Consider repeat colonoscopy (significant weight loss, hematemesis, significant family hx of cancer) Will need follow-up EGD with GI in 8 weeks to check healing (~4/28) Remain on BID PPI until repeat EGD Follow-up with GI concerning surgical pathology/biopsy results MRI rescheduled for 3/14 at 12pm at [facility]  Disposition: Home  Discharge Condition: Good  Discharge Exam:  Vitals:   06/11/23 0738 06/11/23 0851  BP: 131/69 (!) 162/69  Pulse: 75 78  Resp: 16 18  Temp: 98.6 F (37 C) 97.7 F (36.5 C)  SpO2:  100%   General: Pt is lying down in bed with head of bed elevated, no acute distress. Cardiovascular: Regular rate and rhythm, no murmurs/rubs/gallops. Respiratory: Normal work of breathing. Lungs clear to auscultation bilaterally. Abdomen: Bowel sounds normal. Abdomen soft, no masses, no distention; no tenderness to palpation, no rebound, no guarding. Extremities: Grossly normal ROM in all extremities. Minimal edema of bilateral lower extremities.  Significant Procedures:  EGD on 3/10: - LA Grade D reflux esophagitis with no bleeding. Biopsied with resultant excessive bleeding from biopsy site requiring hemostatic gel application. Hemostasis achieved.  - 9 cm hiatal hernia.  - Erythematous mucosa in the gastric body. Biopsied.  - Normal examined duodenum.  Significant Labs and Imaging: Recent Labs  Lab 06/10/23 0341 06/10/23 1935 06/11/23 0346  WBC 6.2 6.3 4.7  HGB 10.4* 9.8* 9.3*  HCT 31.1* 28.9* 27.6*  PLT 135* 123* 108*   Recent Labs  Lab 06/10/23 0341 06/10/23 1935 06/11/23 0345 06/11/23 0346 06/11/23 1118  NA 140 141  --  140 141  K 2.9* 3.5  --  3.1* 3.3*  CL 112* 112*  --  111 112*  CO2 22 21*  --  20* 22  GLUCOSE 94 87  --  103* 117*  BUN 30* 25*  --  22 19  CREATININE 0.87 0.80  --  0.88 1.04*  CALCIUM 7.7* 7.9*  --  7.8* 7.9*  MG  --   --  1.6*  --   --    Mag: 1.6  Results/Tests  Pending at Time of Discharge:  - Surgical pathology (collected 3/10)  Discharge Medications:  Allergies as of 06/11/2023   No Known Allergies      Medication List     STOP taking these medications    nystatin cream Commonly known as: MYCOSTATIN   omeprazole 20 MG capsule Commonly known as: PRILOSEC       TAKE these medications    mesalamine 0.375 g 24 hr capsule Commonly known as: APRISO Take 4 capsules (1.5 g total) by mouth daily. Please schedule a follow up visit for further refills   ondansetron 4 MG tablet Commonly known as: ZOFRAN Take 1 tablet (4 mg total) by mouth every 6 (six) hours as needed for nausea.   pantoprazole 40 MG tablet Commonly known as: Protonix Take 1 tablet (40 mg total) by mouth 2 (two) times daily.   sucralfate 1 GM/10ML suspension Commonly known as: CARAFATE Take 10 mLs (1 g total) by mouth 4 (four) times daily -  with meals and at bedtime for 7 days.        Discharge Instructions: Please refer to Patient Instructions section of EMR for full details.  Patient was counseled important signs and symptoms that should prompt return to medical care, changes in medications, dietary instructions, activity restrictions, and follow up appointments.   Follow-Up Appointments: - Follow-up with PCP on 06/15/22 at 2:30pm at [facility] - Follow-up with GI for EGD (they will call patient to schedule)  Governor Rooks, Medical Student 06/11/2023, 1:08 PM  I was personally present and performed or re-performed the history, physical exam and medical decision making activities of this service and have verified that the service and findings are accurately documented in the student's note.  Tiffany Kocher, DO                  06/11/2023, 1:27 PM

## 2023-06-12 ENCOUNTER — Encounter: Payer: Self-pay | Admitting: Student

## 2023-06-14 ENCOUNTER — Ambulatory Visit: Payer: Self-pay

## 2023-06-14 ENCOUNTER — Ambulatory Visit (HOSPITAL_COMMUNITY)
Admission: RE | Admit: 2023-06-14 | Discharge: 2023-06-14 | Disposition: A | Discharge status: Home / Self Care | Source: Ambulatory Visit | Attending: *Deleted | Admitting: *Deleted

## 2023-06-14 ENCOUNTER — Encounter: Payer: Self-pay | Admitting: Student

## 2023-06-14 VITALS — BP 122/57 | HR 79 | Ht 63.0 in | Wt 137.4 lb

## 2023-06-14 DIAGNOSIS — L304 Erythema intertrigo: Secondary | ICD-10-CM

## 2023-06-14 DIAGNOSIS — K920 Hematemesis: Secondary | ICD-10-CM

## 2023-06-14 DIAGNOSIS — R0981 Nasal congestion: Secondary | ICD-10-CM | POA: Diagnosis not present

## 2023-06-14 DIAGNOSIS — R5383 Other fatigue: Secondary | ICD-10-CM | POA: Insufficient documentation

## 2023-06-14 DIAGNOSIS — R4182 Altered mental status, unspecified: Secondary | ICD-10-CM | POA: Diagnosis not present

## 2023-06-14 DIAGNOSIS — E876 Hypokalemia: Secondary | ICD-10-CM | POA: Diagnosis not present

## 2023-06-14 DIAGNOSIS — I6782 Cerebral ischemia: Secondary | ICD-10-CM | POA: Insufficient documentation

## 2023-06-14 LAB — POC SOFIA 2 FLU + SARS ANTIGEN FIA
Influenza A, POC: NEGATIVE
Influenza B, POC: NEGATIVE
SARS Coronavirus 2 Ag: NEGATIVE

## 2023-06-14 MED ORDER — NYSTATIN 100000 UNIT/GM EX POWD: 1.0000 | Freq: Three times a day (TID) | CUTANEOUS | 0 refills | Status: DC

## 2023-06-14 NOTE — Assessment & Plan Note (Signed)
 Recent hospitalization for hematemesis due to severe reflux esophagitis. No recurrence. - Continue BID PPI therapy - Follow up with GI for repeat endoscopy - CBC

## 2023-06-14 NOTE — Assessment & Plan Note (Signed)
-   BMP - Mag

## 2023-06-14 NOTE — Progress Notes (Signed)
    SUBJECTIVE:   CHIEF COMPLAINT / HPI: Hospital fu  Admitted 3/9-3/11 for hematemesis-EGD with Grade D reflux esophagitis  Has been having a persistent rash. The rash, which is itchy and spreading, is located on the patient's upper arm and buttocks. The patient's sister reports that the rash has been present for a while and seems to be getting worse. The patient has been applying an over-the-counter lotion from [facility] to the rash but unsure what it is  The patient was recently hospitalized due to vomiting blood, which was attributed to severe reflux esophagitis. Since then, the patient has been taking PPI twice a day and is scheduled for a repeat scope. The patient also recently had an MRI brain today, the results of which are pending.  The patient's sister expresses concern about the possibility of sepsis due to a previous UTI and the current rash. Denies any fevers or systemic symptoms She also requests testing for COVID-19 and the flu due to the patient's exposure and ongoing symptoms, including a runny nose.  Follow-up recommendations Recheck K, Mag with PCP; appointment scheduled on 06/14/23 at 2:30pm Consider repeat colonoscopy (significant weight loss, hematemesis, significant family hx of cancer) Will need follow-up EGD with GI in 8 weeks to check healing (~4/28) Remain on BID PPI until repeat EGD Follow-up with GI concerning surgical pathology/biopsy results MRI rescheduled for 3/14 at 12pm at [facility]  PMH / PSH: Crohn's disease, diastolic CHF, paroxysmal A-fib (not on anticoagulation), GERD, HTN, HLD   OBJECTIVE:   BP (!) 122/57   Pulse 79   Ht 5\' 3"  (1.6 m)   Wt 137 lb 6.4 oz (62.3 kg)   SpO2 100%   BMI 24.34 kg/m   General: Well appearing, NAD, awake, alert, responsive to questions Head: Normocephalic atraumatic CV: Regular rate and rhythm no murmurs rubs or gallops Respiratory: Clear to ausculation bilaterally, no wheezes rales or crackles, chest rises  symmetrically,  no increased work of breathing Abdomen: Soft, non-tender, non-distended, normoactive bowel sounds  Extremities: Moves upper and lower extremities freely, no edema in LE Skin: contiguous rash under flexures/breast and pubic sym[his/groin CMA Dayshia Otley present during GU exam    ASSESSMENT/PLAN:   Assessment & Plan Hematemesis, unspecified whether nausea present Recent hospitalization for hematemesis due to severe reflux esophagitis. No recurrence. - Continue BID PPI therapy - Follow up with GI for repeat endoscopy - CBC Intertrigo Rash under breasts and on groin most consistent with intertrigo - Apply nystatin powder three times daily. - Use barrier cream like Vaseline over area of small skin breakdown Nasal congestion -Covid/Flu per pt request (discussed would not changed management given sxs for many weeks) Hypokalemia - BMP - Mag   Levin Erp, MD [facility]

## 2023-06-14 NOTE — Telephone Encounter (Signed)
 I have spoken to patient (and her sister-on speaker phone) to advise of endoscopy scheduled in [facility] 08/05/23 at 9 am as well as telephone previsit on 07/12/23. They verbalize understanding of this information.

## 2023-06-14 NOTE — Patient Instructions (Signed)
 It was great to see you! Thank you for allowing me to participate in your care!   Our plans for today:  - We will get some labs today - I am prescribing nystatin powder to help with your rash - this looks like intertrigo  Take care and seek immediate care sooner if you develop any concerns.  Levin Erp, MD

## 2023-06-15 ENCOUNTER — Encounter: Payer: Self-pay | Admitting: Gastroenterology

## 2023-06-15 LAB — BASIC METABOLIC PANEL
BUN/Creatinine Ratio: 15 (ref 12–28)
BUN: 15 mg/dL (ref 8–27)
CO2: 20 mmol/L (ref 20–29)
Calcium: 8.3 mg/dL — ABNORMAL LOW (ref 8.7–10.3)
Chloride: 102 mmol/L (ref 96–106)
Creatinine, Ser: 1.03 mg/dL — ABNORMAL HIGH (ref 0.57–1.00)
Glucose: 139 mg/dL — ABNORMAL HIGH (ref 70–99)
Potassium: 3.6 mmol/L (ref 3.5–5.2)
Sodium: 138 mmol/L (ref 134–144)
eGFR: 55 mL/min/{1.73_m2} — ABNORMAL LOW (ref >59)

## 2023-06-15 LAB — CBC
Hematocrit: 33.2 % — ABNORMAL LOW (ref 34.0–46.6)
Hemoglobin: 11.1 g/dL (ref 11.1–15.9)
MCH: 28.8 pg (ref 26.6–33.0)
MCHC: 33.4 g/dL (ref 31.5–35.7)
MCV: 86 fL (ref 79–97)
Platelets: 151 10*3/uL (ref 150–450)
RBC: 3.85 x10E6/uL (ref 3.77–5.28)
RDW: 16.4 % — ABNORMAL HIGH (ref 11.7–15.4)
WBC: 8.3 10*3/uL (ref 3.4–10.8)

## 2023-06-15 LAB — MAGNESIUM: Magnesium: 1.8 mg/dL (ref 1.6–2.3)

## 2023-06-15 NOTE — Progress Notes (Signed)
 Diane Freeman,  The biopsies of your stomach were normal.  There was no evidence of H. Pylori infection.  The biopsies of your esophagus showed features of reflux esophagitis (damage from stomach acid).  No evidence of infection or malignancy.  Please take the pantoprazole twice a day until your repeat upper endoscopy (EGD).

## 2023-06-18 NOTE — Progress Notes (Addendum)
 Cardiology Office Note:  .   Date:  06/18/2023  ID:  Diane Freeman, DOB 06-08-1945, MRN [id number] PCP: Diane March, DO  [facility] Providers Cardiologist:  None Electrophysiologist:  Diane Jorja Loa, MD {  History of Present Illness: .   Diane Freeman is a 78 y.o. female w/PMHx of traumatic SDH 2015, Chron's, GIB, diverticulosis, GERD, esophageal stricture, HTN, HLD, CKD (III), remote cath (2015) with no obstructive CAD   Admitted 12/03/22,  abdominal discomfort, vomiting, and urinary complaints, found with low grade temp (met sepsis criteria with fever and elevated lactic acid, UTI), admitted and started on ABX, IVF, LLE edema evaluted and neg for DVT UTI.  She developed rapid AFib treated with IV metoprolol > pause and cardiology consulted EP brought on board  New onset AFib with a number of pauses after IV BB, longest 9.2 seconds.  in setting of UTI/sepsis, 101 temp (on arrival) No hx of near syncope or syncope, recommended amiodarone to help maintain SR with rec to f/u in a few months out patient with plans to stop amiodarone then  She saw S. Riddle, NP 12/20/22, doing well, gaining strength back, saw her PMD who by auscultation felt she was in AFib (no EKG) She was in SR at this visit, amiodarone reduced to 200mg  daily with plans to follow up again in 4-6 weeks  I saw her 02/05/23 Doing better EKG with ectopic atrial/low atrial rhythm 55 > amio stopped OAC continued  ER 03/22/23: CP HS Trop neg x2, CXR, labs unremarkable Suspected GI  At this time, notes report self stopped Eliquis  Admitted 06/09/23 w/hematemesis Pt reported she had stopped Eliquis some time prior to her admission Reported 20lb unintentional weight loss Marked hypokalemia to 2.9 replaced Did not need PRBC EGD showed LA Grade D reflux esophagitis with no bleeding and with biopsy taken; also with 9 cm hiatal hernia, erythematous mucosa in the gastric body which was biopsied  Discharged  06/11/23 GI cleared Eliquis in 1-2 days Discharged OFF  Today's visit is scheduled as a 1 mo f/u  ROS:   She is accompanied by her sister Generally tired, no energy Denies any cardiac concerns though No CP, palpitations or cardiac awareness Sees GI in April for follow up EGD/evaluation  Arrhythmia/AAD hx Found 12/2022 during hospitalization with sepsis Amiodarone started 12/2022 >> stopped Nov 2024  Studies Reviewed: Marland Kitchen    EKG done today and reviewed by myself Baseline artifact, SR 69bpm, normal intervals  06/06/23: TTE 1. GLS is borderline. Left ventricular ejection fraction, by estimation,  is 60 to 65%. The left ventricle has normal function. The left ventricle  has no regional wall motion abnormalities. Left ventricular diastolic  parameters are consistent with Grade  I diastolic dysfunction (impaired relaxation). The average left  ventricular global longitudinal strain is -17.0 %.   2. Right ventricular systolic function is normal. The right ventricular  size is normal. There is normal pulmonary artery systolic pressure. The  estimated right ventricular systolic pressure is 26.4 mmHg.   3. Left atrial size was mildly dilated.   4. The mitral valve is grossly normal. No evidence of mitral valve  regurgitation.   5. The aortic valve was not well visualized. Aortic valve regurgitation  is mild.   6. The inferior vena cava is normal in size with greater than 50%  respiratory variability, suggesting right atrial pressure of 3 mmHg.   Comparison(s): No significant change from prior study.   TTE, 12/05/2022  1. Left  ventricular ejection fraction, by estimation, is 55 to 60%. The left ventricle has normal function. The left ventricle has no regional wall motion abnormalities. There is mild left ventricular hypertrophy. Left ventricular diastolic parameters are consistent with Grade II diastolic dysfunction (pseudonormalization).   2. Right ventricular systolic function is normal.  The right ventricular size is normal. There is moderately elevated pulmonary artery systolic pressure.   3. Left atrial size was mild to moderately dilated.   4. The mitral valve is grossly normal. Trivial mitral valve regurgitation.   5. Tricuspid valve regurgitation is moderate.   6. The aortic valve is tricuspid. Aortic valve regurgitation is not visualized.   7. The inferior vena cava is normal in size with greater than 50% respiratory variability, suggesting right atrial pressure of 3 mmHg.    06/13/2013 TTE Study Conclusions  - Left ventricle: The cavity size was normal. Wall thickness    was normal. Systolic function was normal. The estimated    ejection fraction was in the range of 60% to 65%. Wall    motion was normal; there were no regional wall motion    abnormalities. Doppler parameters are consistent with    abnormal left ventricular relaxation (grade 1 diastolic    dysfunction).  - Tricuspid valve: Mild-moderate regurgitation.  - Pulmonary arteries: PA peak pressure: 32mm Hg (S).  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 ? If ASD PFO   06/15/2013: R/LHC Final Conclusions:   1. Patent coronary arteries with minimal nonobstructive CAD 2. Normal intracardiac hemodynamics 3. Normal LV function 4. No evidence of left-to-right intracardiac shunt   Risk Assessment/Calculations:    Physical Exam:   VS:  There were no vitals taken for this visit.   Wt Readings from Last 3 Encounters:  06/14/23 137 lb 6.4 oz (62.3 kg)  06/10/23 132 lb 15 oz (60.3 kg)  06/04/23 133 lb 9.6 oz (60.6 kg)    GEN: Well nourished, well developed in no acute distress, chronically ill appearing NECK: No JVD; No carotid bruits CARDIAC: RRR, no murmurs, rubs, gallops RESPIRATORY: CTA b/l without rales, wheezing or rhonchi  ABDOMEN: Soft, non-tender, non-distended EXTREMITIES: No edema; No deformity     ASSESSMENT AND PLAN: .    paroxysmal AFib CHA2DS2Vasc is 3 Occurred in the environment of a hospitalization with sepsis Post IV betablocker resulted in long pauses Off amiodarone now since Nov 2024  Pt self stopped Eliquis Dec 2024 >> seems just got lost/forgotten about  Her sister today thinks she probably got sick, COVID was going around and she gets weak, forgetful.  She typically checks in daily/they check in on each other), and they think it just fell through the cracks >>>> Hematemesis admission Freeman 2025  Discussed management/monitoring strategies for Afib Recommend ILR we discussed rational for monitoring/anticoagulation of we find AFib again for her Discussed implant procedure, risks/benefits They both agree and would like to proceed.  ADDEND: Dr. Elberta Fortis reviewed and in agreement to proceed with ILR placement Patient scheduled  Francis Dowse, PA-C 06/25/23   HTN Looks good      Dispo: Diane plan for loop implant if Dr. Elberta Fortis is in agreement, have her back in the next 3-4 weeks, for f/u (implant), sooner if needed.  Signed, [name], PA-C

## 2023-06-19 ENCOUNTER — Other Ambulatory Visit: Payer: Self-pay

## 2023-06-19 MED ORDER — PANTOPRAZOLE SODIUM 40 MG PO TBEC: 40.0000 mg | DELAYED_RELEASE_TABLET | Freq: Two times a day (BID) | ORAL | 1 refills | Status: DC

## 2023-06-19 NOTE — Telephone Encounter (Signed)
 Patient's sister calls nurse line requesting refill on Pantoprazole.  They received 15 day [facility] on 06/11/23.  Sister is asking that we send 30 [facility] [place].   Veronda Prude, RN

## 2023-06-20 ENCOUNTER — Encounter: Payer: Self-pay | Admitting: Physician Assistant

## 2023-06-20 ENCOUNTER — Telehealth: Payer: Self-pay | Admitting: Physician Assistant

## 2023-06-20 ENCOUNTER — Ambulatory Visit: Attending: Physician Assistant | Admitting: Physician Assistant

## 2023-06-20 ENCOUNTER — Telehealth: Payer: Self-pay

## 2023-06-20 VITALS — BP 108/66 | HR 69 | Ht 63.0 in | Wt 138.6 lb

## 2023-06-20 DIAGNOSIS — I10 Essential (primary) hypertension: Secondary | ICD-10-CM | POA: Diagnosis not present

## 2023-06-20 DIAGNOSIS — I480 Paroxysmal atrial fibrillation: Secondary | ICD-10-CM | POA: Insufficient documentation

## 2023-06-20 DIAGNOSIS — J020 Streptococcal pharyngitis: Secondary | ICD-10-CM | POA: Diagnosis not present

## 2023-06-20 NOTE — Telephone Encounter (Signed)
 Pt's sister Diane Freeman called to let office know that she and the pt tested positive for Strep after leaving the office. She just wanted to make those that were around them aware. They did get the penicillin shot to get over it faster. Please advise

## 2023-06-20 NOTE — Patient Instructions (Signed)
 Medication Instructions:   Your physician recommends that you continue on your current medications as directed. Please refer to the Current Medication list given to you today.   *If you need a refill on your cardiac medications before your next appointment, please call your pharmacy*   Lab Work: NONE ORDERED  TODAY     If you have labs (blood work) drawn today and your tests are completely normal, you will receive your results only by: MyChart Message (if you have MyChart) OR A paper copy in the mail If you have any lab test that is abnormal or we need to change your treatment, we will call you to review the results.   Testing/Procedures: NONE ORDERED  TODAY     Follow-Up: At [facility], you and your health needs are our priority.  As part of our continuing mission to provide you with exceptional heart care, we have created designated Provider Care Teams.  These Care Teams include your primary Cardiologist (physician) and Advanced Practice Providers (APPs -  Physician Assistants and Nurse Practitioners) who all work together to provide you with the care you need, when you need it.  We recommend signing up for the patient portal called "MyChart".  Sign up information is provided on this After Visit Summary.  MyChart is used to connect with patients for Virtual Visits (Telemedicine).  Patients are able to view lab/test results, encounter notes, upcoming appointments, etc.  Non-urgent messages can be sent to your provider as well.   To learn more about what you can do with MyChart, go to [email or web address].     Your next appointment:    3 -4 week(s) ( CONTACT  CASSIE HALL/ ANGELINE HAMMER FOR EP SCHEDULING ISSUES )   Provider:    Loman Brooklyn, MD or Doreatha Martin, PA-C    Other Instructions       1st Floor: - Lobby - Registration  - Pharmacy  - Lab - Cafe  2nd Floor: - PV Lab - Diagnostic Testing (echo, CT, nuclear med)  3rd Floor: -  Vacant  4th Floor: - TCTS (cardiothoracic surgery) - AFib Clinic - Structural Heart Clinic - Vascular Surgery  - Vascular Ultrasound  5th Floor: - HeartCare Cardiology (general and EP) - Clinical Pharmacy for coumadin, hypertension, lipid, weight-loss medications, and med management appointments    Valet parking services will be available as well.

## 2023-06-20 NOTE — Telephone Encounter (Signed)
 Patients sister Alexia Freestone) calls nurse line requesting MRI results.  Advised imaging is taking 2-3 weeks to come back due to staffing.   Advised we will call once the report comes back.

## 2023-06-26 ENCOUNTER — Other Ambulatory Visit: Payer: Self-pay | Admitting: Gastroenterology

## 2023-06-26 ENCOUNTER — Telehealth: Payer: Self-pay | Admitting: Gastroenterology

## 2023-06-26 NOTE — Telephone Encounter (Signed)
 Unable to find any record of where pt was called from this office today.

## 2023-06-26 NOTE — Telephone Encounter (Signed)
 Inbound call from patient's sister stating she missed a call but unsure from who. Please advise, thank you.

## 2023-07-04 ENCOUNTER — Encounter: Payer: Self-pay | Admitting: Student

## 2023-07-12 ENCOUNTER — Ambulatory Visit

## 2023-07-12 VITALS — Ht 63.0 in | Wt 130.0 lb

## 2023-07-12 DIAGNOSIS — K2100 Gastro-esophageal reflux disease with esophagitis, without bleeding: Secondary | ICD-10-CM

## 2023-07-12 NOTE — Progress Notes (Signed)
 No egg or soy allergy known to patient  No issues known to pt with past sedation with any surgeries or procedures Patient denies ever being told they had issues or difficulty with intubation  No FH of Malignant Hyperthermia Pt is not on diet pills Pt is not on  home 02  Pt is not on blood thinners  Pt denies issues with constipation  Hx of  A fib (seeing a cardiologist) Have any cardiac testing pending--07/24/23, has an appt for "heart monitoring" Pt can ambulate  Pt denies use of chewing tobacco Discussed diabetic I weight loss medication holds Discussed NSAID holds Checked BMI Pt instructed to use Singlecare.com or GoodRx for a price reduction on prep  Patient's chart reviewed by Cathlyn Parsons CNRA prior to previsit and patient appropriate for the [facility].  Pre visit completed and red dot placed by patient's name on their procedure day (on provider's schedule).

## 2023-07-16 ENCOUNTER — Other Ambulatory Visit: Payer: Self-pay | Admitting: Family Medicine

## 2023-07-17 ENCOUNTER — Other Ambulatory Visit: Payer: Self-pay

## 2023-07-17 ENCOUNTER — Inpatient Hospital Stay (HOSPITAL_COMMUNITY)

## 2023-07-17 ENCOUNTER — Encounter (HOSPITAL_COMMUNITY): Payer: Self-pay | Admitting: Emergency Medicine

## 2023-07-17 ENCOUNTER — Emergency Department (HOSPITAL_COMMUNITY)

## 2023-07-17 ENCOUNTER — Inpatient Hospital Stay (HOSPITAL_COMMUNITY)
Admission: EM | Admit: 2023-07-17 | Discharge: 2023-07-24 | DRG: 871 | Disposition: A | Discharge status: Skilled Nursing Facility | Attending: Family Medicine | Admitting: Family Medicine

## 2023-07-17 DIAGNOSIS — R0989 Other specified symptoms and signs involving the circulatory and respiratory systems: Secondary | ICD-10-CM | POA: Diagnosis not present

## 2023-07-17 DIAGNOSIS — A4901 Methicillin susceptible Staphylococcus aureus infection, unspecified site: Secondary | ICD-10-CM | POA: Diagnosis not present

## 2023-07-17 DIAGNOSIS — I82441 Acute embolism and thrombosis of right tibial vein: Secondary | ICD-10-CM | POA: Diagnosis present

## 2023-07-17 DIAGNOSIS — E440 Moderate protein-calorie malnutrition: Secondary | ICD-10-CM | POA: Diagnosis present

## 2023-07-17 DIAGNOSIS — E876 Hypokalemia: Secondary | ICD-10-CM | POA: Diagnosis not present

## 2023-07-17 DIAGNOSIS — A419 Sepsis, unspecified organism: Principal | ICD-10-CM | POA: Diagnosis present

## 2023-07-17 DIAGNOSIS — I82419 Acute embolism and thrombosis of unspecified femoral vein: Secondary | ICD-10-CM | POA: Diagnosis present

## 2023-07-17 DIAGNOSIS — R6520 Severe sepsis without septic shock: Principal | ICD-10-CM

## 2023-07-17 DIAGNOSIS — E8721 Acute metabolic acidosis: Secondary | ICD-10-CM | POA: Diagnosis not present

## 2023-07-17 DIAGNOSIS — E785 Hyperlipidemia, unspecified: Secondary | ICD-10-CM | POA: Diagnosis present

## 2023-07-17 DIAGNOSIS — D649 Anemia, unspecified: Secondary | ICD-10-CM | POA: Diagnosis not present

## 2023-07-17 DIAGNOSIS — M6281 Muscle weakness (generalized): Secondary | ICD-10-CM | POA: Diagnosis not present

## 2023-07-17 DIAGNOSIS — G9341 Metabolic encephalopathy: Secondary | ICD-10-CM | POA: Diagnosis present

## 2023-07-17 DIAGNOSIS — G9340 Encephalopathy, unspecified: Secondary | ICD-10-CM | POA: Diagnosis not present

## 2023-07-17 DIAGNOSIS — K760 Fatty (change of) liver, not elsewhere classified: Secondary | ICD-10-CM | POA: Diagnosis present

## 2023-07-17 DIAGNOSIS — I82451 Acute embolism and thrombosis of right peroneal vein: Secondary | ICD-10-CM | POA: Diagnosis present

## 2023-07-17 DIAGNOSIS — R7401 Elevation of levels of liver transaminase levels: Secondary | ICD-10-CM | POA: Diagnosis not present

## 2023-07-17 DIAGNOSIS — E872 Acidosis, unspecified: Secondary | ICD-10-CM | POA: Diagnosis present

## 2023-07-17 DIAGNOSIS — I110 Hypertensive heart disease with heart failure: Secondary | ICD-10-CM | POA: Diagnosis present

## 2023-07-17 DIAGNOSIS — R918 Other nonspecific abnormal finding of lung field: Secondary | ICD-10-CM | POA: Diagnosis not present

## 2023-07-17 DIAGNOSIS — N390 Urinary tract infection, site not specified: Secondary | ICD-10-CM | POA: Diagnosis present

## 2023-07-17 DIAGNOSIS — R5383 Other fatigue: Secondary | ICD-10-CM | POA: Diagnosis not present

## 2023-07-17 DIAGNOSIS — T679XXA Effect of heat and light, unspecified, initial encounter: Secondary | ICD-10-CM | POA: Diagnosis not present

## 2023-07-17 DIAGNOSIS — A4151 Sepsis due to Escherichia coli [E. coli]: Secondary | ICD-10-CM | POA: Diagnosis not present

## 2023-07-17 DIAGNOSIS — K209 Esophagitis, unspecified without bleeding: Secondary | ICD-10-CM | POA: Diagnosis not present

## 2023-07-17 DIAGNOSIS — F0280 Dementia in other diseases classified elsewhere without behavioral disturbance: Secondary | ICD-10-CM | POA: Diagnosis not present

## 2023-07-17 DIAGNOSIS — B9561 Methicillin susceptible Staphylococcus aureus infection as the cause of diseases classified elsewhere: Secondary | ICD-10-CM | POA: Diagnosis present

## 2023-07-17 DIAGNOSIS — I480 Paroxysmal atrial fibrillation: Secondary | ICD-10-CM | POA: Diagnosis not present

## 2023-07-17 DIAGNOSIS — I82431 Acute embolism and thrombosis of right popliteal vein: Secondary | ICD-10-CM | POA: Diagnosis present

## 2023-07-17 DIAGNOSIS — D696 Thrombocytopenia, unspecified: Secondary | ICD-10-CM | POA: Diagnosis not present

## 2023-07-17 DIAGNOSIS — Z741 Need for assistance with personal care: Secondary | ICD-10-CM | POA: Diagnosis not present

## 2023-07-17 DIAGNOSIS — R1011 Right upper quadrant pain: Secondary | ICD-10-CM | POA: Diagnosis not present

## 2023-07-17 DIAGNOSIS — J189 Pneumonia, unspecified organism: Secondary | ICD-10-CM

## 2023-07-17 DIAGNOSIS — Z7401 Bed confinement status: Secondary | ICD-10-CM | POA: Diagnosis not present

## 2023-07-17 DIAGNOSIS — K2100 Gastro-esophageal reflux disease with esophagitis, without bleeding: Secondary | ICD-10-CM | POA: Diagnosis present

## 2023-07-17 DIAGNOSIS — I5032 Chronic diastolic (congestive) heart failure: Secondary | ICD-10-CM | POA: Diagnosis present

## 2023-07-17 DIAGNOSIS — I5030 Unspecified diastolic (congestive) heart failure: Secondary | ICD-10-CM | POA: Diagnosis not present

## 2023-07-17 DIAGNOSIS — Z6827 Body mass index (BMI) 27.0-27.9, adult: Secondary | ICD-10-CM

## 2023-07-17 DIAGNOSIS — R579 Shock, unspecified: Secondary | ICD-10-CM | POA: Diagnosis present

## 2023-07-17 DIAGNOSIS — D6959 Other secondary thrombocytopenia: Secondary | ICD-10-CM | POA: Diagnosis present

## 2023-07-17 DIAGNOSIS — I959 Hypotension, unspecified: Secondary | ICD-10-CM | POA: Diagnosis not present

## 2023-07-17 DIAGNOSIS — R6521 Severe sepsis with septic shock: Secondary | ICD-10-CM | POA: Diagnosis not present

## 2023-07-17 DIAGNOSIS — I82411 Acute embolism and thrombosis of right femoral vein: Secondary | ICD-10-CM | POA: Diagnosis present

## 2023-07-17 DIAGNOSIS — N3001 Acute cystitis with hematuria: Secondary | ICD-10-CM | POA: Diagnosis not present

## 2023-07-17 DIAGNOSIS — R9431 Abnormal electrocardiogram [ECG] [EKG]: Secondary | ICD-10-CM | POA: Diagnosis present

## 2023-07-17 DIAGNOSIS — N170 Acute kidney failure with tubular necrosis: Secondary | ICD-10-CM | POA: Diagnosis present

## 2023-07-17 DIAGNOSIS — E860 Dehydration: Secondary | ICD-10-CM | POA: Diagnosis present

## 2023-07-17 DIAGNOSIS — D638 Anemia in other chronic diseases classified elsewhere: Secondary | ICD-10-CM | POA: Diagnosis present

## 2023-07-17 DIAGNOSIS — K50118 Crohn's disease of large intestine with other complication: Secondary | ICD-10-CM | POA: Diagnosis present

## 2023-07-17 DIAGNOSIS — L570 Actinic keratosis: Secondary | ICD-10-CM | POA: Diagnosis not present

## 2023-07-17 DIAGNOSIS — R278 Other lack of coordination: Secondary | ICD-10-CM | POA: Diagnosis not present

## 2023-07-17 DIAGNOSIS — R5381 Other malaise: Secondary | ICD-10-CM | POA: Diagnosis not present

## 2023-07-17 DIAGNOSIS — F0390 Unspecified dementia without behavioral disturbance: Secondary | ICD-10-CM | POA: Diagnosis present

## 2023-07-17 DIAGNOSIS — L409 Psoriasis, unspecified: Secondary | ICD-10-CM | POA: Diagnosis not present

## 2023-07-17 DIAGNOSIS — Z79899 Other long term (current) drug therapy: Secondary | ICD-10-CM

## 2023-07-17 DIAGNOSIS — R531 Weakness: Secondary | ICD-10-CM | POA: Diagnosis not present

## 2023-07-17 DIAGNOSIS — N179 Acute kidney failure, unspecified: Secondary | ICD-10-CM | POA: Diagnosis not present

## 2023-07-17 DIAGNOSIS — R4182 Altered mental status, unspecified: Secondary | ICD-10-CM | POA: Diagnosis not present

## 2023-07-17 DIAGNOSIS — M7989 Other specified soft tissue disorders: Secondary | ICD-10-CM | POA: Diagnosis not present

## 2023-07-17 DIAGNOSIS — Z9049 Acquired absence of other specified parts of digestive tract: Secondary | ICD-10-CM

## 2023-07-17 DIAGNOSIS — I82401 Acute embolism and thrombosis of unspecified deep veins of right lower extremity: Secondary | ICD-10-CM | POA: Diagnosis not present

## 2023-07-17 DIAGNOSIS — R112 Nausea with vomiting, unspecified: Secondary | ICD-10-CM | POA: Diagnosis present

## 2023-07-17 DIAGNOSIS — R0689 Other abnormalities of breathing: Secondary | ICD-10-CM | POA: Diagnosis not present

## 2023-07-17 DIAGNOSIS — K920 Hematemesis: Secondary | ICD-10-CM | POA: Diagnosis not present

## 2023-07-17 DIAGNOSIS — R41841 Cognitive communication deficit: Secondary | ICD-10-CM | POA: Diagnosis not present

## 2023-07-17 DIAGNOSIS — Z8673 Personal history of transient ischemic attack (TIA), and cerebral infarction without residual deficits: Secondary | ICD-10-CM

## 2023-07-17 DIAGNOSIS — I700 Atherosclerosis of aorta: Secondary | ICD-10-CM | POA: Diagnosis not present

## 2023-07-17 DIAGNOSIS — I517 Cardiomegaly: Secondary | ICD-10-CM | POA: Diagnosis not present

## 2023-07-17 DIAGNOSIS — Z452 Encounter for adjustment and management of vascular access device: Secondary | ICD-10-CM | POA: Diagnosis not present

## 2023-07-17 DIAGNOSIS — Z800 Family history of malignant neoplasm of digestive organs: Secondary | ICD-10-CM

## 2023-07-17 DIAGNOSIS — R2681 Unsteadiness on feet: Secondary | ICD-10-CM | POA: Diagnosis not present

## 2023-07-17 DIAGNOSIS — R410 Disorientation, unspecified: Secondary | ICD-10-CM | POA: Diagnosis not present

## 2023-07-17 DIAGNOSIS — Z789 Other specified health status: Secondary | ICD-10-CM

## 2023-07-17 DIAGNOSIS — Z9181 History of falling: Secondary | ICD-10-CM

## 2023-07-17 DIAGNOSIS — Z803 Family history of malignant neoplasm of breast: Secondary | ICD-10-CM

## 2023-07-17 DIAGNOSIS — R197 Diarrhea, unspecified: Secondary | ICD-10-CM | POA: Diagnosis not present

## 2023-07-17 DIAGNOSIS — E663 Overweight: Secondary | ICD-10-CM | POA: Diagnosis present

## 2023-07-17 LAB — PROCALCITONIN: Procalcitonin: 23.51 ng/mL

## 2023-07-17 LAB — GLUCOSE, CAPILLARY
Glucose-Capillary: 105 mg/dL — ABNORMAL HIGH (ref 70–99)
Glucose-Capillary: 113 mg/dL — ABNORMAL HIGH (ref 70–99)
Glucose-Capillary: 57 mg/dL — ABNORMAL LOW (ref 70–99)

## 2023-07-17 LAB — I-STAT CHEM 8, ED
BUN: 13 mg/dL (ref 8–23)
Calcium, Ion: 0.95 mmol/L — ABNORMAL LOW (ref 1.15–1.40)
Chloride: 107 mmol/L (ref 98–111)
Creatinine, Ser: 1.7 mg/dL — ABNORMAL HIGH (ref 0.44–1.00)
Glucose, Bld: 69 mg/dL — ABNORMAL LOW (ref 70–99)
HCT: 34 % — ABNORMAL LOW (ref 36.0–46.0)
Hemoglobin: 11.6 g/dL — ABNORMAL LOW (ref 12.0–15.0)
Potassium: 2.6 mmol/L — CL (ref 3.5–5.1)
Sodium: 142 mmol/L (ref 135–145)
TCO2: 16 mmol/L — ABNORMAL LOW (ref 22–32)

## 2023-07-17 LAB — I-STAT CG4 LACTIC ACID, ED
Lactic Acid, Venous: 8.2 mmol/L (ref 0.5–1.9)
Lactic Acid, Venous: 9.6 mmol/L (ref 0.5–1.9)

## 2023-07-17 LAB — I-STAT VENOUS BLOOD GAS, ED
Acid-base deficit: 10 mmol/L — ABNORMAL HIGH (ref 0.0–2.0)
Bicarbonate: 15.5 mmol/L — ABNORMAL LOW (ref 20.0–28.0)
Calcium, Ion: 0.96 mmol/L — ABNORMAL LOW (ref 1.15–1.40)
HCT: 32 % — ABNORMAL LOW (ref 36.0–46.0)
Hemoglobin: 10.9 g/dL — ABNORMAL LOW (ref 12.0–15.0)
O2 Saturation: 77 %
Potassium: 2.4 mmol/L — CL (ref 3.5–5.1)
Sodium: 143 mmol/L (ref 135–145)
TCO2: 16 mmol/L — ABNORMAL LOW (ref 22–32)
pCO2, Ven: 31.1 mmHg — ABNORMAL LOW (ref 44–60)
pH, Ven: 7.306 (ref 7.25–7.43)
pO2, Ven: 45 mmHg (ref 32–45)

## 2023-07-17 LAB — CBC WITH DIFFERENTIAL/PLATELET
Abs Immature Granulocytes: 0.26 10*3/uL — ABNORMAL HIGH (ref 0.00–0.07)
Basophils Absolute: 0 10*3/uL (ref 0.0–0.1)
Basophils Relative: 0 %
Eosinophils Absolute: 0.1 10*3/uL (ref 0.0–0.5)
Eosinophils Relative: 0 %
HCT: 34.9 % — ABNORMAL LOW (ref 36.0–46.0)
Hemoglobin: 11.1 g/dL — ABNORMAL LOW (ref 12.0–15.0)
Immature Granulocytes: 2 %
Lymphocytes Relative: 3 %
Lymphs Abs: 0.4 10*3/uL — ABNORMAL LOW (ref 0.7–4.0)
MCH: 27.1 pg (ref 26.0–34.0)
MCHC: 31.8 g/dL (ref 30.0–36.0)
MCV: 85.3 fL (ref 80.0–100.0)
Monocytes Absolute: 0.3 10*3/uL (ref 0.1–1.0)
Monocytes Relative: 2 %
Neutro Abs: 13.4 10*3/uL — ABNORMAL HIGH (ref 1.7–7.7)
Neutrophils Relative %: 93 %
Platelets: 127 10*3/uL — ABNORMAL LOW (ref 150–400)
RBC: 4.09 MIL/uL (ref 3.87–5.11)
RDW: 16.2 % — ABNORMAL HIGH (ref 11.5–15.5)
WBC: 14.4 10*3/uL — ABNORMAL HIGH (ref 4.0–10.5)
nRBC: 0 % (ref 0.0–0.2)

## 2023-07-17 LAB — COMPREHENSIVE METABOLIC PANEL WITH GFR
ALT: 64 U/L — ABNORMAL HIGH (ref 0–44)
AST: 294 U/L — ABNORMAL HIGH (ref 15–41)
Albumin: 1.8 g/dL — ABNORMAL LOW (ref 3.5–5.0)
Alkaline Phosphatase: 117 U/L (ref 38–126)
Anion gap: 18 — ABNORMAL HIGH (ref 5–15)
BUN: 13 mg/dL (ref 8–23)
CO2: 16 mmol/L — ABNORMAL LOW (ref 22–32)
Calcium: 7.5 mg/dL — ABNORMAL LOW (ref 8.9–10.3)
Chloride: 107 mmol/L (ref 98–111)
Creatinine, Ser: 1.87 mg/dL — ABNORMAL HIGH (ref 0.44–1.00)
GFR, Estimated: 27 mL/min — ABNORMAL LOW (ref >60)
Glucose, Bld: 73 mg/dL (ref 70–99)
Potassium: 2.6 mmol/L — CL (ref 3.5–5.1)
Sodium: 141 mmol/L (ref 135–145)
Total Bilirubin: 3.3 mg/dL — ABNORMAL HIGH (ref 0.0–1.2)
Total Protein: 5.7 g/dL — ABNORMAL LOW (ref 6.5–8.1)

## 2023-07-17 LAB — LIPASE, BLOOD: Lipase: 37 U/L (ref 11–51)

## 2023-07-17 LAB — BASIC METABOLIC PANEL WITH GFR
Anion gap: 15 (ref 5–15)
BUN: 12 mg/dL (ref 8–23)
CO2: 16 mmol/L — ABNORMAL LOW (ref 22–32)
Calcium: 7.6 mg/dL — ABNORMAL LOW (ref 8.9–10.3)
Chloride: 106 mmol/L (ref 98–111)
Creatinine, Ser: 1.64 mg/dL — ABNORMAL HIGH (ref 0.44–1.00)
GFR, Estimated: 32 mL/min — ABNORMAL LOW (ref >60)
Glucose, Bld: 129 mg/dL — ABNORMAL HIGH (ref 70–99)
Potassium: 2.8 mmol/L — ABNORMAL LOW (ref 3.5–5.1)
Sodium: 137 mmol/L (ref 135–145)

## 2023-07-17 LAB — PROTIME-INR
INR: 1.7 — ABNORMAL HIGH (ref 0.8–1.2)
Prothrombin Time: 19.8 s — ABNORMAL HIGH (ref 11.4–15.2)

## 2023-07-17 LAB — TROPONIN I (HIGH SENSITIVITY)
Troponin I (High Sensitivity): 49 ng/L — ABNORMAL HIGH (ref <18)
Troponin I (High Sensitivity): 125 ng/L (ref <18)

## 2023-07-17 LAB — RESP PANEL BY RT-PCR (RSV, FLU A&B, COVID)  RVPGX2
Influenza A by PCR: NEGATIVE
Influenza B by PCR: NEGATIVE
Resp Syncytial Virus by PCR: NEGATIVE
SARS Coronavirus 2 by RT PCR: NEGATIVE

## 2023-07-17 LAB — CBC
HCT: 32.8 % — ABNORMAL LOW (ref 36.0–46.0)
Hemoglobin: 10.5 g/dL — ABNORMAL LOW (ref 12.0–15.0)
MCH: 26.9 pg (ref 26.0–34.0)
MCHC: 32 g/dL (ref 30.0–36.0)
MCV: 84.1 fL (ref 80.0–100.0)
Platelets: 220 10*3/uL (ref 150–400)
RBC: 3.9 MIL/uL (ref 3.87–5.11)
RDW: 16.6 % — ABNORMAL HIGH (ref 11.5–15.5)
WBC: 48.4 10*3/uL — ABNORMAL HIGH (ref 4.0–10.5)
nRBC: 0 % (ref 0.0–0.2)

## 2023-07-17 LAB — MAGNESIUM
Magnesium: 1.4 mg/dL — ABNORMAL LOW (ref 1.7–2.4)
Magnesium: 1.9 mg/dL (ref 1.7–2.4)

## 2023-07-17 LAB — CREATININE, SERUM
Creatinine, Ser: 1.58 mg/dL — ABNORMAL HIGH (ref 0.44–1.00)
GFR, Estimated: 33 mL/min — ABNORMAL LOW

## 2023-07-17 LAB — CK: Total CK: 91 U/L (ref 38–234)

## 2023-07-17 LAB — LACTIC ACID, PLASMA: Lactic Acid, Venous: 6.3 mmol/L (ref 0.5–1.9)

## 2023-07-17 LAB — PHOSPHORUS: Phosphorus: 3.1 mg/dL (ref 2.5–4.6)

## 2023-07-17 LAB — APTT: aPTT: 29 s (ref 24–36)

## 2023-07-17 LAB — MRSA NEXT GEN BY PCR, NASAL: MRSA by PCR Next Gen: NOT DETECTED

## 2023-07-17 MED ORDER — DEXTROSE 50 % IV SOLN: 12.5000 g | INTRAVENOUS | Status: AC

## 2023-07-17 MED ORDER — LACTATED RINGERS IV BOLUS: 1000.0000 mL | Freq: Once | INTRAVENOUS | Status: DC

## 2023-07-17 MED ORDER — SODIUM CHLORIDE 0.9 % IV SOLN
1.0000 g | Freq: Once | INTRAVENOUS | Status: AC
  Administered 2023-07-17: 1 g via INTRAVENOUS
  Filled 2023-07-17: qty 10

## 2023-07-17 MED ORDER — LACTATED RINGERS IV BOLUS
1000.0000 mL | Freq: Once | INTRAVENOUS | Status: AC
  Administered 2023-07-17: 1000 mL via INTRAVENOUS

## 2023-07-17 MED ORDER — PANTOPRAZOLE SODIUM 40 MG PO TBEC
40.0000 mg | DELAYED_RELEASE_TABLET | Freq: Two times a day (BID) | ORAL | Status: DC
  Administered 2023-07-17 – 2023-07-24 (×14): 40 mg via ORAL
  Filled 2023-07-17 (×14): qty 1

## 2023-07-17 MED ORDER — SODIUM CHLORIDE 0.9 % IV BOLUS: 1000.0000 mL | Freq: Once | INTRAVENOUS | Status: DC

## 2023-07-17 MED ORDER — DEXTROSE 50 % IV SOLN
INTRAVENOUS | Status: AC
  Administered 2023-07-17: 12.5 g via INTRAVENOUS
  Filled 2023-07-17: qty 50

## 2023-07-17 MED ORDER — SODIUM CHLORIDE 0.9 % IV SOLN
250.0000 mL | INTRAVENOUS | Status: AC
  Administered 2023-07-17: 250 mL via INTRAVENOUS

## 2023-07-17 MED ORDER — CHLORHEXIDINE GLUCONATE CLOTH 2 % EX PADS: 6.0000 | MEDICATED_PAD | Freq: Every day | CUTANEOUS | Status: DC

## 2023-07-17 MED ORDER — IPRATROPIUM-ALBUTEROL 0.5-2.5 (3) MG/3ML IN SOLN: 3.0000 mL | RESPIRATORY_TRACT | Status: DC | PRN

## 2023-07-17 MED ORDER — MUPIROCIN 2 % EX OINT
1.0000 | TOPICAL_OINTMENT | Freq: Two times a day (BID) | CUTANEOUS | Status: AC
  Administered 2023-07-17 – 2023-07-22 (×10): 1 via NASAL
  Filled 2023-07-17 (×4): qty 22

## 2023-07-17 MED ORDER — ENOXAPARIN SODIUM 60 MG/0.6ML IJ SOSY
1.0000 mg/kg | PREFILLED_SYRINGE | Freq: Two times a day (BID) | INTRAMUSCULAR | Status: DC
Start: 2023-07-18 — End: 2023-07-17

## 2023-07-17 MED ORDER — POTASSIUM CHLORIDE 10 MEQ/100ML IV SOLN
10.0000 meq | INTRAVENOUS | Status: AC
  Administered 2023-07-17: 10 meq via INTRAVENOUS
  Filled 2023-07-17 (×2): qty 100

## 2023-07-17 MED ORDER — SODIUM CHLORIDE 0.9 % IV SOLN
500.0000 mg | INTRAVENOUS | Status: DC
  Filled 2023-07-17: qty 5

## 2023-07-17 MED ORDER — ENOXAPARIN SODIUM 30 MG/0.3ML IJ SOSY
30.0000 mg | PREFILLED_SYRINGE | INTRAMUSCULAR | Status: AC
  Administered 2023-07-17: 30 mg via SUBCUTANEOUS
  Filled 2023-07-17: qty 0.3

## 2023-07-17 MED ORDER — CALCIUM GLUCONATE-NACL 1-0.675 GM/50ML-% IV SOLN
1.0000 g | Freq: Once | INTRAVENOUS | Status: AC
  Administered 2023-07-17: 1000 mg via INTRAVENOUS
  Filled 2023-07-17: qty 50

## 2023-07-17 MED ORDER — POTASSIUM CHLORIDE 10 MEQ/50ML IV SOLN
10.0000 meq | Freq: Once | INTRAVENOUS | Status: AC
  Administered 2023-07-17: 10 meq via INTRAVENOUS
  Filled 2023-07-17: qty 50

## 2023-07-17 MED ORDER — ENOXAPARIN SODIUM 60 MG/0.6ML IJ SOSY: 1.0000 mg/kg | PREFILLED_SYRINGE | INTRAMUSCULAR | Status: DC

## 2023-07-17 MED ORDER — SODIUM CHLORIDE 0.9 % IV SOLN
500.0000 mg | Freq: Once | INTRAVENOUS | Status: AC
  Administered 2023-07-17: 500 mg via INTRAVENOUS
  Filled 2023-07-17: qty 5

## 2023-07-17 MED ORDER — MAGNESIUM SULFATE 2 GM/50ML IV SOLN
2.0000 g | Freq: Once | INTRAVENOUS | Status: AC
  Administered 2023-07-17: 2 g via INTRAVENOUS
  Filled 2023-07-17: qty 50

## 2023-07-17 MED ORDER — SUCRALFATE 1 GM/10ML PO SUSP: 1.0000 g | Freq: Three times a day (TID) | ORAL | Status: DC

## 2023-07-17 MED ORDER — PHENYLEPHRINE HCL-NACL 20-0.9 MG/250ML-% IV SOLN: 0.0000 ug/min | INTRAVENOUS | Status: DC

## 2023-07-17 MED ORDER — ENOXAPARIN SODIUM 30 MG/0.3ML IJ SOSY
30.0000 mg | PREFILLED_SYRINGE | INTRAMUSCULAR | Status: DC
  Administered 2023-07-17: 30 mg via SUBCUTANEOUS
  Filled 2023-07-17: qty 0.3

## 2023-07-17 MED ORDER — NOREPINEPHRINE 4 MG/250ML-% IV SOLN
0.0000 ug/min | INTRAVENOUS | Status: DC
  Administered 2023-07-17: 14 ug/min via INTRAVENOUS
  Administered 2023-07-17: 2 ug/min via INTRAVENOUS
  Administered 2023-07-18: 10 ug/min via INTRAVENOUS
  Administered 2023-07-19: 1 ug/min via INTRAVENOUS
  Filled 2023-07-17 (×4): qty 250

## 2023-07-17 MED ORDER — LACTATED RINGERS IV SOLN: INTRAVENOUS | Status: AC

## 2023-07-17 MED ORDER — VANCOMYCIN HCL IN DEXTROSE 1-5 GM/200ML-% IV SOLN: 1000.0000 mg | INTRAVENOUS | Status: DC

## 2023-07-17 MED ORDER — CHLORHEXIDINE GLUCONATE CLOTH 2 % EX PADS
6.0000 | MEDICATED_PAD | Freq: Every day | CUTANEOUS | Status: DC
  Administered 2023-07-17 – 2023-07-24 (×8): 6 via TOPICAL

## 2023-07-17 MED ORDER — SODIUM CHLORIDE 0.9 % IV SOLN: 1.0000 g | INTRAVENOUS | Status: DC

## 2023-07-17 MED ORDER — VANCOMYCIN HCL 1.25 G IV SOLR
1250.0000 mg | Freq: Once | INTRAVENOUS | Status: AC
  Administered 2023-07-17: 1250 mg via INTRAVENOUS
  Filled 2023-07-17 (×2): qty 25

## 2023-07-17 MED ORDER — NYSTATIN 100000 UNIT/GM EX POWD
Freq: Three times a day (TID) | CUTANEOUS | Status: DC
  Filled 2023-07-17: qty 15

## 2023-07-17 MED ORDER — POTASSIUM CHLORIDE CRYS ER 20 MEQ PO TBCR
40.0000 meq | EXTENDED_RELEASE_TABLET | Freq: Once | ORAL | Status: AC
  Administered 2023-07-17: 40 meq via ORAL
  Filled 2023-07-17: qty 2

## 2023-07-17 MED ORDER — OXIDIZED CELLULOSE EX PADS
1.0000 | MEDICATED_PAD | CUTANEOUS | Status: AC
  Administered 2023-07-17: 1 via TOPICAL
  Filled 2023-07-17: qty 1

## 2023-07-17 NOTE — ED Notes (Signed)
 Lab called, will add on initial troponin.

## 2023-07-17 NOTE — Plan of Care (Signed)
  Problem: Education: °Goal: Knowledge of General Education information will improve °Description: Including pain rating scale, medication(s)/side effects and non-pharmacologic comfort measures °Outcome: Progressing °  °Problem: Health Behavior/Discharge Planning: °Goal: Ability to manage health-related needs will improve °Outcome: Progressing °  °Problem: Clinical Measurements: °Goal: Ability to maintain clinical measurements within normal limits will improve °Outcome: Progressing °Goal: Will remain free from infection °Outcome: Progressing °Goal: Diagnostic test results will improve °Outcome: Progressing °Goal: Respiratory complications will improve °Outcome: Progressing °Goal: Cardiovascular complication will be avoided °Outcome: Progressing °  °Problem: Nutrition: °Goal: Adequate nutrition will be maintained °Outcome: Progressing °  °Problem: Safety: °Goal: Ability to remain free from injury will improve °Outcome: Progressing °  °

## 2023-07-17 NOTE — ED Provider Notes (Signed)
 [facility] AT [facility] Provider Note   CSN: [id number] Arrival date & time: 07/17/23  1144     History  Chief Complaint  Patient presents with   Heat Exposure   Weakness    Diane Freeman is a 78 y.o. female history of CHF, A-fib with RVR, Crohn's presented with dehydration and weakness.  Last night patient states she felt cold from the outside weather and so she turned the heat up.  Patient woke up this morning and immediately felt weak.  Daughter called however patient did not pick up and so welfare check was initiated. EMS states that the indoor temp was 90 degrees and patient was diaphoretic and panting at the time.  Patient had improved with 500 of normal saline.  Patient denies any chest pain or shortness of breath but states that she has had some emesis this morning but denies any abdominal pain or fevers or recent illnesses.  Last echo 06/06/2023: LVEF 60 to 65%, normal pulmonary artery pressure, mild AV regurg  Home Medications Prior to Admission medications   Medication Sig Start Date End Date Taking? Authorizing Provider  mesalamine (APRISO) 0.375 g 24 hr capsule Take 4 capsules (1.5 g total) by mouth daily. You are overdue for an office visit. Please schedule an office visit as soon as possible and for any further refills. Thank you. Patient taking differently: Take 3 capsules by mouth daily. 06/26/23  Yes Armbruster, Willaim Rayas, MD  pantoprazole (PROTONIX) 40 MG tablet TAKE 1 TABLET BY MOUTH TWICE A DAY Patient taking differently: Take 40 mg by mouth daily. 07/17/23  Yes Everhart, Kirstie, DO  nystatin (MYCOSTATIN/NYSTOP) powder Apply 1 Application topically 3 (three) times daily. Patient not taking: Reported on 07/12/2023 06/14/23   Levin Erp, MD  ondansetron (ZOFRAN) 4 MG tablet Take 1 tablet (4 mg total) by mouth every 6 (six) hours as needed for nausea. Patient not taking: Reported on 07/12/2023 06/11/23   Tiffany Kocher, DO   sucralfate (CARAFATE) 1 GM/10ML suspension Take 10 mLs (1 g total) by mouth 4 (four) times daily -  with meals and at bedtime for 7 days. Patient not taking: Reported on 07/12/2023 06/11/23 06/20/23  Tiffany Kocher, DO      Allergies    Patient has no known allergies.    Review of Systems   Review of Systems  Neurological:  Positive for weakness.    Physical Exam Updated Vital Signs BP (!) 80/47   Pulse 86   Temp 98.8 F (37.1 C)   Resp (!) 30   SpO2 93%  Physical Exam Constitutional:      General: She is in acute distress.  HENT:     Mouth/Throat:     Mouth: Mucous membranes are dry.  Eyes:     Extraocular Movements: Extraocular movements intact.     Conjunctiva/sclera: Conjunctivae normal.     Pupils: Pupils are equal, round, and reactive to light.  Cardiovascular:     Rate and Rhythm: Normal rate and regular rhythm.     Heart sounds: Normal heart sounds.     Comments: Thready pulse bilaterally in the radial Pulmonary:     Effort: Pulmonary effort is normal. No respiratory distress.     Breath sounds: Normal breath sounds.  Abdominal:     Palpations: Abdomen is soft.     Tenderness: There is no abdominal tenderness. There is no guarding or rebound.  Musculoskeletal:        General: Normal range of  motion.  Skin:    General: Skin is warm and dry.     Capillary Refill: Capillary refill takes more than 3 seconds.  Neurological:     Mental Status: She is alert and oriented to person, place, and time.  Psychiatric:        Mood and Affect: Mood normal.     ED Results / Procedures / Treatments   Labs (all labs ordered are listed, but only abnormal results are displayed) Labs Reviewed  CBC WITH DIFFERENTIAL/PLATELET - Abnormal; Notable for the following components:      Result Value   WBC 14.4 (*)    Hemoglobin 11.1 (*)    HCT 34.9 (*)    RDW 16.2 (*)    Platelets 127 (*)    Neutro Abs 13.4 (*)    Lymphs Abs 0.4 (*)    Abs Immature Granulocytes 0.26 (*)     All other components within normal limits  COMPREHENSIVE METABOLIC PANEL WITH GFR - Abnormal; Notable for the following components:   Potassium 2.6 (*)    CO2 16 (*)    Creatinine, Ser 1.87 (*)    Calcium 7.5 (*)    Total Protein 5.7 (*)    Albumin 1.8 (*)    AST 294 (*)    ALT 64 (*)    Total Bilirubin 3.3 (*)    GFR, Estimated 27 (*)    Anion gap 18 (*)    All other components within normal limits  PROTIME-INR - Abnormal; Notable for the following components:   Prothrombin Time 19.8 (*)    INR 1.7 (*)    All other components within normal limits  I-STAT CG4 LACTIC ACID, ED - Abnormal; Notable for the following components:   Lactic Acid, Venous 8.2 (*)    All other components within normal limits  I-STAT CHEM 8, ED - Abnormal; Notable for the following components:   Potassium 2.6 (*)    Creatinine, Ser 1.70 (*)    Glucose, Bld 69 (*)    Calcium, Ion 0.95 (*)    TCO2 16 (*)    Hemoglobin 11.6 (*)    HCT 34.0 (*)    All other components within normal limits  RESP PANEL BY RT-PCR (RSV, FLU A&B, COVID)  RVPGX2  CULTURE, BLOOD (ROUTINE X 2)  CULTURE, BLOOD (ROUTINE X 2)  CK  LIPASE, BLOOD  APTT  URINALYSIS, W/ REFLEX TO CULTURE (INFECTION SUSPECTED)  MAGNESIUM  I-STAT CG4 LACTIC ACID, ED  I-STAT VENOUS BLOOD GAS, ED  TROPONIN I (HIGH SENSITIVITY)    EKG None  Radiology No results found.  Procedures .Critical Care  Performed by: Netta Corrigan, PA-C Authorized by: Netta Corrigan, PA-C   Critical care provider statement:    Critical care time (minutes):  80   Critical care time was exclusive of:  Separately billable procedures and treating other patients   Critical care was necessary to treat or prevent imminent or life-threatening deterioration of the following conditions:  Sepsis and dehydration   Critical care was time spent personally by me on the following activities:  Blood draw for specimens, development of treatment plan with patient or surrogate,  evaluation of patient's response to treatment, examination of patient, obtaining history from patient or surrogate, review of old charts, re-evaluation of patient's condition, pulse oximetry, ordering and review of radiographic studies, ordering and review of laboratory studies, ordering and performing treatments and interventions and discussions with consultants   I assumed direction of critical care for this  patient from another provider in my specialty: no     Care discussed with: admitting provider       Medications Ordered in ED Medications  lactated ringers infusion ( Intravenous Infusion Verify 07/17/23 1445)  norepinephrine (LEVOPHED) 4mg  in (0.016 mg/mL) premix infusion (8 mcg/min Intravenous Infusion Verify 07/17/23 1453)  lactated ringers bolus 1,000 mL (has no administration in time range)  potassium chloride SA (KLOR-CON M) CR tablet 40 mEq (has no administration in time range)  potassium chloride 10 mEq in 100 mL IVPB (has no administration in time range)  lactated ringers bolus 1,000 mL (0 mLs Intravenous Stopped 07/17/23 1432)  lactated ringers bolus 1,000 mL (0 mLs Intravenous Stopped 07/17/23 1432)  cefTRIAXone (ROCEPHIN) 1 g in sodium chloride 0.9 % 100 mL IVPB (0 g Intravenous Stopped 07/17/23 1405)  azithromycin (ZITHROMAX) 500 mg in sodium chloride 0.9 % 250 mL IVPB (0 mg Intravenous Stopped 07/17/23 1441)    ED Course/ Medical Decision Making/ A&P                                 Medical Decision Making Amount and/or Complexity of Data Reviewed Labs: ordered. Radiology: ordered.  Risk Prescription drug management.   Diane Freeman 78 y.o. presented today for heat exposure.  Working DDx that I considered at this time includes, but not limited to, heat cramps, heat exhaustion, heat stroke, rhabdomyolysis, dehydration, AKI, electrolyte abnormalities, encephalopathy, sepsis.  R/o DDx: heat cramps, heat exhaustion, heat stroke, rhabdomyolysis, electrolyte  abnormalities, encephalopath: These are considered less likely due to history of present illness, physical exam, labs/imaging findings  Review of prior external notes: 12/31/2022 admission  Unique Tests and My Independent Interpretation:  CBC: Leukocytosis 14.4 CMP: Hypokalemia 2.6, anion gap 18, CO2 16, transaminitis 294/64, total bilirubin 3.3, GFR 27 CK: Unremarkable UA: pending EKG: Sinus 73 bpm, prolonged QT, no signs of ischemia Chest x-ray:pending I-STAT Chem-8: Hypokalemia 2.6, T CO2 16 Lactic acid: 8.2 Magnesium:pending Blood cultures: Pending Magnesium:pending PT/INR: Mildly elevated 19.8/1.7 APTT:Unremarkable Lipase: Unremarkable Respiratory panel: Negative Right upper quadrant ultrasound: Pending VBG: Pending  Social Determinants of Health: none  Discussion with Independent Historian: None  Discussion of Management of Tests:  Myrla Halsted, MD critical care  Risk: High: hospitalization or escalation of hospital-level care  Risk Stratification Score: None  Staffed with Lynelle Doctor, MD  Plan: On exam patient was acute distress.  Patient temperature upon arrival was 98.8 F.  Patient does appear dehydrated as her oral mucosa is dry along with cap refill greater than 3 seconds. will give patient IV fluids and obtain labs.  Patient also did endorse some emesis although it did not have any abdominal tenderness or peritoneal signs and so this could be related to being overheated but will get abdominal labs.  Labs did show a lactic acid of 8.2.  Chest x-ray was ordered as patient was endorsing some infectious symptoms such as nausea vomiting and her SpO2 is 92% which is lower than normal along with some tachypnea that do show some infiltrates and so we will start sepsis workup along with antibiotics.  Patient blood pressure continues to be hypotensive 89 systolic and so we will do another bolus of LR and then start an infusion.  Patient CMP does show hypokalemia of 2.6 along with  AKI with creatinine at 1.87/GFR 27, anion gap 18.  CO2 is 16 as we will get VBG however do suspect a lot of  this is from vomiting like dehydration.  Patient also does have elevated liver enzymes and elevated total bilirubin at 3.3 and so we will get right quadrant ultrasound to see if patient has a stone causing her symptoms.  Patient's blood pressure continues to drop into the 60s despite 2 L of fluids and so we will start Levophed and give another liter of fluid and consult critical care for admission as a despite patient may have pneumonia causing septic shock as her chest x-ray does show some infiltrates in the left side.  I spoke to critical care and patient was accepted and they will come down to see her.        Final Clinical Impression(s) / ED Diagnoses Final diagnoses:  Sepsis with acute organ dysfunction without septic shock, due to unspecified organism, unspecified organ dysfunction type (HCC)  Hypokalemia  AKI (acute kidney injury) (HCC)  Transaminitis    Rx / DC Orders ED Discharge Orders     None         Elex Grimmer 07/17/23 1515    Trish Furl, MD 07/18/23 [phone]

## 2023-07-17 NOTE — Progress Notes (Addendum)
 eLink Physician-Brief Progress Note Patient Name: Diane Freeman DOB: [birth year] MRN: [id number]   Date of Service  07/17/2023  HPI/Events of Note  78 year old female is admitted with sepsis secondary to suspected pneumonia found to have an elevated troponin on trend.  No clear evidence of ACS at this point  eICU Interventions  Repeat troponin with a.m. labs   2204 - acute deep vein thrombosis involving the right common femoral vein, right femoral vein, right popliteal vein, right  posterior tibial veins, and right peroneal veins.  ->  Switch enoxaparin to 1 Mg per KG twice daily  Intervention Category Minor Interventions: Routine modifications to care plan (e.g. PRN medications for pain, fever)  Chezney Huether 07/17/2023, 9:38 PM

## 2023-07-17 NOTE — ED Notes (Signed)
 Nt called CCMD to put pt on monitor

## 2023-07-17 NOTE — Progress Notes (Signed)
 Pharmacy Antibiotic Note  Diane Freeman is a 78 y.o. female admitted on 07/17/2023 with sepsis.  Pharmacy has been consulted for vancomycin dosing. Patient also started on azithromycin and ceftriaxone in the ED.   4/16: WBC 14.4, Scr 1.7, Lactate 9.6, afebrile, HR 102  Plan: Vancomycin 1250 mg IV x1, then 1000 mg IV every 48 hours (eAUC 515, Scr 1.7, IBW) Azithromycin and ceftriaxone per MD Monitor renal function and vancomycin levels as appropriate F/u culture results and clinical progression De-escalate antibiotics as appropriate    Temp (24hrs), Avg:98.3 F (36.8 C), Min:97.8 F (36.6 C), Max:98.8 F (37.1 C)  Recent Labs  Lab 07/17/23 1253 07/17/23 1259 07/17/23 1517  WBC 14.4*  --   --   CREATININE 1.87* 1.70*  --   LATICACIDVEN  --  8.2* 9.6*    Estimated Creatinine Clearance: 22.2 mL/min (A) (by C-G formula based on SCr of 1.7 mg/dL (H)).    No Known Allergies  Antimicrobials this admission: Azithromycin 4/16 >> Ceftriaxone 4/16 >> Vancomycin 4/16 >>  Microbiology results: 4/16 Respiratory Cx: 4/16 Bcx: 4/16 MRSA: 4/16 RVP: negative  Thank you for allowing pharmacy to be a part of this patient's care.  Volney Grumbles, PharmD PGY-1 Acute Care Pharmacy Resident 07/17/2023 4:06 PM

## 2023-07-17 NOTE — Progress Notes (Signed)
 Lower ext venous duplex  has been completed. Refer to [facility] under chart review to view preliminary results. Results given to patient's ER nurse.  07/17/2023  5:02 PM Cyrus Ramsburg, Hollace Lund

## 2023-07-17 NOTE — ED Triage Notes (Addendum)
 Pt BIB by [facility] for generalized weakness, chills, and prolonged heat exposure. Per EMS, pt reported intermittent chills and turned heat up in trailer. Upon EMS arrival, EMS reports indoor temp 90 degrees and pt found diaphoretic and "panting." AMS noted but returning to baseline. Daughter states she was unable to reach her via telephone which prompted the wellness check.   Initial VS: BP 109/49 HR 94  CBG 130

## 2023-07-17 NOTE — H&P (Addendum)
 NAMEADRIENA Freeman, MRN:  [id number], DOB:  Apr 20, 1945, LOS: 0 ADMISSION DATE:  07/17/2023, CONSULTATION DATE:  07/17/2023 REFERRING MD: Denese Finn, PA-C, CHIEF COMPLAINT: Hypotension   History of Present Illness:  A 78 y.o. female patient with hx of D-CHF (06/06/2023, Echo: EF 60%, G1DD), A-fib, Crohn's disease, early dementia, moderate PCM, GERD, HTN, dyslipidemia, anemia of chronic illness, who was discharged 06/11/2023 for UGIB (EGD showed LA Grade D reflux esophagitis with no bleeding and negative biopsy for H-Pylori; 9 cm hiatal hernia, erythematous mucosa in the gastric body). She presented today from trailer by EMS with generalized weakness for one day. Sister called to check on her, but she did not pick up the phone several times, so sister came and found her altered, N/V x2 (yellow fluid), hot, having chills, dehydrated, and trailer temp was 90 F. Last night patient stated she felt cold from the outside weather and so she turned the heat up. EMS stated that the indoor temp was 90 degrees and patient was diaphoretic and panting at the time. Patient had improved with 500 cc of normal saline. Given 2 L LR (3rd liter is starting), started on Levophed due to hypotension. Patient denied CP, SOB, DOE, cough, wheezing, diarrhea, abd pain, rash, and dysuria. Denied smoking, alcohol drinking, and illicit drug use.    Pertinent  Medical History  Had fall and brain bleeding 4 yrs ago, D-CHF (06/06/2023, Echo: EF 60%, G1DD), A-fib (not on AC), Crohn's disease, early dementia, moderate PCM, GERD, HTN, dyslipidemia, anemia of chronic illness, UGIB 06/11/2023 (EGD showed LA Grade D reflux esophagitis with no bleeding and negative biopsy for H-Pylori; 9 cm hiatal hernia, erythematous mucosa in the gastric body)  Significant Hospital Events: Including procedures, antibiotic start and stop dates in addition to other pertinent events   4/16: Rocephin, Zithromax, 3 L LR, Levophed 10 mcg/min, IV Kcl. Will admit  to ICU  Interim History / Subjective:    Objective   Blood pressure (!) 95/49, pulse 86, temperature 98.8 F (37.1 C), resp. rate (!) 26, SpO2 93%.        Intake/Output Summary (Last 24 hours) at 07/17/2023 1521 Last data filed at 07/17/2023 1514 Gross per 24 hour  Intake 658.3 ml  Output --  Net 658.3 ml   There were no vitals filed for this visit.  Examination: General: alert, oriented x2 (president, sister and location. Missed date). Comfortable. On 3.5 L [place], SpO2 96%. Afib with controlled rate HENT: PERL, dry pharynx and oral mucosa. No LNE or thyromegaly. No JVD Lungs: symmetrical air entry bilaterally. No crackles or wheezing Cardiovascular: NL S1/S2. No m/g/r Abdomen: no distension or tenderness Extremities: trace distal edema. Asymmetrical: right >left (sister is not aware of this)  Neuro: nonfocal    Resolved Hospital Problem list     Assessment & Plan:  Septic shock, probably due to LLL PNA, ?UTI -admit to ICU -Rocephin and Zithromax -Urine Ags -Bcx x2 -Resp Cx -UA/Cx -MRSA screening -I/S -Duoneb PRN -IVF -Serial LA and labs -I/O chart -Levophed for MAP >65 mmHg -Plan CVC in the ICU and possible a-line -Venous doppler for both legs  FULL CODE: d/w sister and the patient   HypoK, hypoMg, hypoCa -Replace and monitor  Thrombocytopenia, chronic Anemia of chronic illness -Monitor  AGMA/lactic acidosis due to shock -Serial LA -IVF  Elevated LFTs due to shock -US  liver -Serial LFTs   AKI due to ATN/shock -IVF -Serial BMP  D-CHF (06/06/2023, Echo: EF 60%, G1DD), HTN, dyslipidemia,  A-fib with controlled rate, not AC due to UGIB and risk of falls  Crohn's disease -Resume Mesalamine when more stable  Moderate PCM -MVT -Dietitian consult   GERD and recent UGIB (June 11, 2023: EGD showed LA Grade D reflux esophagitis with no bleeding and negative biopsy for H-Pylori; 9 cm hiatal hernia, erythematous mucosa in the gastric body) -Resume  sucralfate and PPI  Early dementia Acute metabolic encephalopathy: resolved with hydration   -Delirium precautions  Best Practice (right click and "Reselect all SmartList Selections" daily)   Diet/type: NPO w/ oral meds DVT prophylaxis LMWH Pressure ulcer(s): N/A GI prophylaxis: PPI Lines: N/A Foley:  Yes, and it is still needed Code Status:  full code Last date of multidisciplinary goals of care discussion []   Labs   CBC: Recent Labs  Lab 07/17/23 1253 07/17/23 1259 07/17/23 1517  WBC 14.4*  --   --   NEUTROABS 13.4*  --   --   HGB 11.1* 11.6* 10.9*  HCT 34.9* 34.0* 32.0*  MCV 85.3  --   --   PLT 127*  --   --     Basic Metabolic Panel: Recent Labs  Lab 07/17/23 1253 07/17/23 1259 07/17/23 1517  NA 141 142 143  K 2.6* 2.6* 2.4*  CL 107 107  --   CO2 16*  --   --   GLUCOSE 73 69*  --   BUN 13 13  --   CREATININE 1.87* 1.70*  --   CALCIUM 7.5*  --   --    GFR: Estimated Creatinine Clearance: 22.2 mL/min (A) (by C-G formula based on SCr of 1.7 mg/dL (H)). Recent Labs  Lab 07/17/23 1253 07/17/23 1259 07/17/23 1517  WBC 14.4*  --   --   LATICACIDVEN  --  8.2* 9.6*    Liver Function Tests: Recent Labs  Lab 07/17/23 1253  AST 294*  ALT 64*  ALKPHOS 117  BILITOT 3.3*  PROT 5.7*  ALBUMIN 1.8*   Recent Labs  Lab 07/17/23 1253  LIPASE 37   No results for input(s): "AMMONIA" in the last 168 hours.  ABG    Component Value Date/Time   PHART 7.442 06/15/2013 1307   PCO2ART 31.7 (L) 06/15/2013 1307   PO2ART 59.0 (L) 06/15/2013 1307   HCO3 15.5 (L) 07/17/2023 1517   TCO2 16 (L) 07/17/2023 1517   ACIDBASEDEF 10.0 (H) 07/17/2023 1517   O2SAT 77 07/17/2023 1517     Coagulation Profile: Recent Labs  Lab 07/17/23 1334  INR 1.7*    Cardiac Enzymes: Recent Labs  Lab 07/17/23 1253  CKTOTAL 91    HbA1C: Hemoglobin A1C  Date/Time Value Ref Range Status  06/04/2023 10:15 AM 5.3 4.0 - 5.6 % Final  08/19/2014 08:37 AM 5.0  Final   Hgb A1c  MFr Bld  Date/Time Value Ref Range Status  06/12/2013 10:59 PM 6.1 (H) <5.7 % Final    Comment:    (NOTE)                                                                       According to the ADA Clinical Practice Recommendations for 2011, when HbA1c is used as a screening test:  >=6.5%   Diagnostic of Diabetes Mellitus           (  if abnormal result is confirmed) 5.7-6.4%   Increased risk of developing Diabetes Mellitus References:Diagnosis and Classification of Diabetes Mellitus,Diabetes Care,2011,34(Suppl 1):S62-S69 and Standards of Medical Care in         Diabetes - 2011,Diabetes Care,2011,34 (Suppl 1):S11-S61.  11/16/2009 09:44 AM 5.0 %     CBG: No results for input(s): "GLUCAP" in the last 168 hours.  Review of Systems:   Review of Systems  Constitutional:  Positive for chills, diaphoresis, malaise/fatigue and weight loss. Negative for fever.  HENT:  Negative for congestion, nosebleeds, sinus pain and sore throat.   Respiratory:  Negative for cough, hemoptysis, sputum production, shortness of breath, wheezing and stridor.   Cardiovascular:  Positive for leg swelling. Negative for chest pain, palpitations, orthopnea, claudication and PND.  Gastrointestinal:  Positive for nausea and vomiting. Negative for abdominal pain, blood in stool, constipation, diarrhea, heartburn and melena.  Genitourinary:  Positive for dysuria and urgency. Negative for frequency.  Skin:  Negative for itching and rash.     Past Medical History:  She,  has a past medical history of Abdominal pain (12/03/2022), Adrenal nodule (HCC), Anemia, Blood transfusion without reported diagnosis, Cataract, Choledocholithiasis with obstruction, Colitis, Crohn's colitis (HCC) (07/11/2012), Diastolic dysfunction, Diverticulosis, Elevated blood pressure reading (02/21/2021), Fatty liver, GERD (gastroesophageal reflux disease), Hiatal hernia, History of esophageal stricture, History of left heart catheterization, gastritis,  Lower extremity edema (12/03/2022), Palpitations, Pyelonephritis (05/29/2022), Right shoulder pain (12/03/2022), Sepsis (HCC) (12/03/2022), and UTI (urinary tract infection) (05/14/2013).   Surgical History:   Past Surgical History:  Procedure Laterality Date   CARDIAC CATHETERIZATION  05/2013   CATARACT EXTRACTION Bilateral    CHOLECYSTECTOMY  2009   COLONOSCOPY  last 09/03/2017, 2020   ERCP  06/02/2011   Procedure: ENDOSCOPIC RETROGRADE CHOLANGIOPANCREATOGRAPHY (ERCP);  Surgeon: Kenney Peacemaker, MD;  Location: [facility];  Service: Endoscopy;  Laterality: N/A;   ESOPHAGOGASTRODUODENOSCOPY N/A 06/10/2023   Procedure: EGD (ESOPHAGOGASTRODUODENOSCOPY);  Surgeon: Elois Hair, MD;  Location: [facility] ENDOSCOPY;  Service: Gastroenterology;  Laterality: N/A;   HEMOSTASIS CLIP PLACEMENT  06/10/2023   Procedure: CONTROL OF HEMORRHAGE, GI TRACT, ENDOSCOPIC, BY CLIPPING OR OVERSEWING;  Surgeon: Elois Hair, MD;  Location: [facility] ENDOSCOPY;  Service: Gastroenterology;;   LEFT AND RIGHT HEART CATHETERIZATION WITH CORONARY ANGIOGRAM N/A 06/15/2013   Procedure: LEFT AND RIGHT HEART CATHETERIZATION WITH CORONARY ANGIOGRAM- Shunt series;  Surgeon: Arlander Bellman, MD;  Location: [facility] CATH LAB;  Service: Cardiovascular;  Laterality: N/A;   TUBAL LIGATION     UPPER GASTROINTESTINAL ENDOSCOPY       Social History:   reports that she has never smoked. She has never used smokeless tobacco. She reports that she does not drink alcohol and does not use drugs.   Family History:  Her family history includes Bone cancer in her maternal grandmother; Breast cancer in her sister; Colon cancer (age of onset: 85) in her maternal aunt; Stomach cancer (age of onset: 52) in her sister. There is no history of Esophageal cancer, Rectal cancer, Pancreatic cancer, or Colon polyps.   Allergies No Known Allergies   Home Medications  Prior to Admission medications   Medication Sig Start Date End Date Taking? Authorizing  Provider  mesalamine (APRISO) 0.375 g 24 hr capsule Take 4 capsules (1.5 g total) by mouth daily. You are overdue for an office visit. Please schedule an office visit as soon as possible and for any further refills. Thank you. Patient taking differently: Take 3 capsules by mouth daily. 06/26/23  Yes Armbruster, Landon Pinion  P, MD  pantoprazole (PROTONIX) 40 MG tablet TAKE 1 TABLET BY MOUTH TWICE A DAY Patient taking differently: Take 40 mg by mouth daily. 07/17/23  Yes Everhart, Kirstie, DO  nystatin (MYCOSTATIN/NYSTOP) powder Apply 1 Application topically 3 (three) times daily. Patient not taking: Reported on 07/12/2023 06/14/23   Jagadish, Mayuri, MD  ondansetron (ZOFRAN) 4 MG tablet Take 1 tablet (4 mg total) by mouth every 6 (six) hours as needed for nausea. Patient not taking: Reported on 07/12/2023 06/11/23   Lavada Porteous, DO  sucralfate (CARAFATE) 1 GM/10ML suspension Take 10 mLs (1 g total) by mouth 4 (four) times daily -  with meals and at bedtime for 7 days. Patient not taking: Reported on 07/12/2023 06/11/23 06/20/23  Lavada Porteous, DO     Critical care time: 80 min      Arlyne Bering, MD [facility]

## 2023-07-17 NOTE — Procedures (Signed)
 Central Venous Catheter Insertion Procedure Note  Diane Freeman  [id number]  07/22/1945  Date:07/17/23  Time:6:44 PM   Provider Performing:MD Albustami/Shamar Kracke Josephus Nida   Procedure: Insertion of Non-tunneled Central Venous Catheter(36556) with US  guidance (62130)   Indication(s) Medication administration and Difficult access  Consent Risks of the procedure as well as the alternatives and risks of each were explained to the patient and/or caregiver.  Consent for the procedure was obtained and is signed in the bedside chart Consent given by son and daughter, consent signed  Anesthesia Topical only with 1% lidocaine   Timeout Verified patient identification, verified procedure, site/side was marked, verified correct patient position, special equipment/implants available, medications/allergies/relevant history reviewed, required imaging and test results available.  Sterile Technique Maximal sterile technique including full sterile barrier drape, hand hygiene, sterile gown, sterile gloves, mask, hair covering, sterile ultrasound probe cover (if used).  Procedure Description Area of catheter insertion was cleaned with chlorhexidine and draped in sterile fashion.  With real-time ultrasound guidance a CVC was placed into the left internal jugular vein. Nonpulsatile blood flow and easy flushing noted in all ports.  The catheter was sutured in place and sterile dressing applied.  Complications/Tolerance None; patient tolerated the procedure well. Chest X-ray is ordered to verify placement for internal jugular or subclavian cannulation.   Chest x-ray is not ordered for femoral cannulation.  EBL Minimal  Specimen(s) None  Rey Catholic AGACNP-BC   [facility] 07/17/2023, 6:45 PM  Please see [email or web address] for pager details.  From 7A-7P if no response, please call [phone]. After hours, please call ELink [phone].

## 2023-07-18 ENCOUNTER — Other Ambulatory Visit (HOSPITAL_COMMUNITY): Payer: Self-pay

## 2023-07-18 ENCOUNTER — Telehealth (HOSPITAL_COMMUNITY): Payer: Self-pay | Admitting: Pharmacy Technician

## 2023-07-18 DIAGNOSIS — R6521 Severe sepsis with septic shock: Secondary | ICD-10-CM | POA: Diagnosis not present

## 2023-07-18 DIAGNOSIS — I82401 Acute embolism and thrombosis of unspecified deep veins of right lower extremity: Secondary | ICD-10-CM | POA: Diagnosis not present

## 2023-07-18 DIAGNOSIS — A4151 Sepsis due to Escherichia coli [E. coli]: Secondary | ICD-10-CM | POA: Diagnosis not present

## 2023-07-18 LAB — BLOOD CULTURE ID PANEL (REFLEXED) - BCID2

## 2023-07-18 LAB — LACTIC ACID, PLASMA
Lactic Acid, Venous: 2.1 mmol/L (ref 0.5–1.9)
Lactic Acid, Venous: 3.1 mmol/L (ref 0.5–1.9)
Lactic Acid, Venous: 6.1 mmol/L (ref 0.5–1.9)

## 2023-07-18 LAB — BASIC METABOLIC PANEL WITH GFR
Anion gap: 13 (ref 5–15)
BUN: 12 mg/dL (ref 8–23)
CO2: 17 mmol/L — ABNORMAL LOW (ref 22–32)
Calcium: 7.4 mg/dL — ABNORMAL LOW (ref 8.9–10.3)
Chloride: 107 mmol/L (ref 98–111)
Creatinine, Ser: 1.37 mg/dL — ABNORMAL HIGH (ref 0.44–1.00)
GFR, Estimated: 39 mL/min — ABNORMAL LOW (ref >60)
Glucose, Bld: 117 mg/dL — ABNORMAL HIGH (ref 70–99)
Potassium: 3.3 mmol/L — ABNORMAL LOW (ref 3.5–5.1)
Sodium: 137 mmol/L (ref 135–145)

## 2023-07-18 LAB — CBC
HCT: 31.2 % — ABNORMAL LOW (ref 36.0–46.0)
Hemoglobin: 10.2 g/dL — ABNORMAL LOW (ref 12.0–15.0)
MCH: 26.9 pg (ref 26.0–34.0)
MCHC: 32.7 g/dL (ref 30.0–36.0)
MCV: 82.3 fL (ref 80.0–100.0)
Platelets: 133 10*3/uL — ABNORMAL LOW (ref 150–400)
RBC: 3.79 MIL/uL — ABNORMAL LOW (ref 3.87–5.11)
RDW: 16.8 % — ABNORMAL HIGH (ref 11.5–15.5)
WBC: 33 10*3/uL — ABNORMAL HIGH (ref 4.0–10.5)
nRBC: 0 % (ref 0.0–0.2)

## 2023-07-18 LAB — URINALYSIS, W/ REFLEX TO CULTURE (INFECTION SUSPECTED)
Glucose, UA: NEGATIVE mg/dL
Hgb urine dipstick: NEGATIVE
Ketones, ur: NEGATIVE mg/dL
Nitrite: POSITIVE — AB
Protein, ur: 30 mg/dL — AB
Specific Gravity, Urine: 1.018 (ref 1.005–1.030)
WBC, UA: >50 WBC/hpf (ref 0–5)
pH: 5 (ref 5.0–8.0)

## 2023-07-18 LAB — MAGNESIUM: Magnesium: 1.9 mg/dL (ref 1.7–2.4)

## 2023-07-18 LAB — GLUCOSE, CAPILLARY
Glucose-Capillary: 79 mg/dL (ref 70–99)
Glucose-Capillary: 99 mg/dL (ref 70–99)
Glucose-Capillary: 73 mg/dL (ref 70–99)
Glucose-Capillary: 88 mg/dL (ref 70–99)
Glucose-Capillary: 58 mg/dL — ABNORMAL LOW (ref 70–99)
Glucose-Capillary: 87 mg/dL (ref 70–99)
Glucose-Capillary: 105 mg/dL — ABNORMAL HIGH (ref 70–99)

## 2023-07-18 LAB — TROPONIN I (HIGH SENSITIVITY): Troponin I (High Sensitivity): 77 ng/L — ABNORMAL HIGH (ref <18)

## 2023-07-18 LAB — STREP PNEUMONIAE URINARY ANTIGEN: Strep Pneumo Urinary Antigen: NEGATIVE

## 2023-07-18 LAB — PHOSPHORUS: Phosphorus: 2.9 mg/dL (ref 2.5–4.6)

## 2023-07-18 LAB — T4, FREE: Free T4: 2 ng/dL — ABNORMAL HIGH (ref 0.61–1.12)

## 2023-07-18 LAB — HEPARIN LEVEL (UNFRACTIONATED): Heparin Unfractionated: 0.14 [IU]/mL — ABNORMAL LOW (ref 0.30–0.70)

## 2023-07-18 LAB — TSH: TSH: 0.477 u[IU]/mL (ref 0.350–4.500)

## 2023-07-18 MED ORDER — POTASSIUM CHLORIDE CRYS ER 20 MEQ PO TBCR
40.0000 meq | EXTENDED_RELEASE_TABLET | Freq: Once | ORAL | Status: AC
  Administered 2023-07-18: 40 meq via ORAL
  Filled 2023-07-18: qty 2

## 2023-07-18 MED ORDER — SODIUM CHLORIDE 0.9 % IV BOLUS
500.0000 mL | Freq: Once | INTRAVENOUS | Status: AC
  Administered 2023-07-18: 500 mL via INTRAVENOUS

## 2023-07-18 MED ORDER — LACTATED RINGERS IV SOLN: INTRAVENOUS | Status: AC

## 2023-07-18 MED ORDER — SODIUM CHLORIDE 0.9 % IV SOLN
2.0000 g | INTRAVENOUS | Status: DC
  Administered 2023-07-18: 2 g via INTRAVENOUS
  Filled 2023-07-18: qty 20

## 2023-07-18 MED ORDER — HEPARIN (PORCINE) 25000 UT/250ML-% IV SOLN
1500.0000 [IU]/h | INTRAVENOUS | Status: AC
  Administered 2023-07-18: 1000 [IU]/h via INTRAVENOUS
  Administered 2023-07-19: 1350 [IU]/h via INTRAVENOUS
  Administered 2023-07-20 (×2): 1500 [IU]/h via INTRAVENOUS
  Filled 2023-07-18 (×4): qty 250

## 2023-07-18 MED ORDER — SODIUM CHLORIDE 0.9 % IV SOLN
2.0000 g | INTRAVENOUS | Status: AC
Start: 2023-07-19 — End: 2023-07-24
  Administered 2023-07-19 – 2023-07-24 (×6): 2 g via INTRAVENOUS
  Filled 2023-07-18 (×6): qty 20

## 2023-07-18 MED ORDER — HEPARIN BOLUS VIA INFUSION
1500.0000 [IU] | Freq: Once | INTRAVENOUS | Status: AC
  Administered 2023-07-18: 1500 [IU] via INTRAVENOUS
  Filled 2023-07-18: qty 1500

## 2023-07-18 MED ORDER — MAGNESIUM SULFATE 2 GM/50ML IV SOLN
2.0000 g | Freq: Once | INTRAVENOUS | Status: AC
  Administered 2023-07-18: 2 g via INTRAVENOUS
  Filled 2023-07-18: qty 50

## 2023-07-18 MED ORDER — POTASSIUM CHLORIDE 10 MEQ/100ML IV SOLN
10.0000 meq | INTRAVENOUS | Status: DC
Start: 2023-07-18 — End: 2023-07-18

## 2023-07-18 NOTE — Telephone Encounter (Signed)
 Patient Product/process development scientist completed.    The patient is insured through [employer]. Patient has Medicare and is not eligible for a copay card, but may be able to apply for patient assistance or Medicare RX Payment Plan (Patient Must reach out to their plan, if eligible for payment plan), if available.    Ran test claim for Eliquis 5 mg and the current 30 day co-pay is $4.80.   This test claim was processed through [facility]- copay amounts may vary at other pharmacies due to pharmacy/plan contracts, or as the patient moves through the different stages of their insurance plan.     Diane Freeman, CPHT Pharmacy Technician III Certified Patient Advocate [facility] Patient Advocate Team Direct Number: [phone]  Fax: [phone]

## 2023-07-18 NOTE — Progress Notes (Addendum)
 PHARMACY - PHYSICIAN COMMUNICATION CRITICAL VALUE ALERT - BLOOD CULTURE IDENTIFICATION (BCID)  Diane Freeman is an 78 y.o. female who presented to [facility] on 07/17/2023 with a chief complaint of weakness   Name of physician (or Provider) Contacted: Dr. Rito Chess  Current antibiotics: Ceftriaxone 1g IV q24h, Vancomycin, Azithromycin   Changes to prescribed antibiotics recommended:  Increase Ceftriaxone to 2g IV q24h  Results for orders placed or performed during the hospital encounter of 07/17/23  Blood Culture ID Panel (Reflexed) (Collected: 07/17/2023  1:34 PM)  Result Value Ref Range   Enterococcus faecalis NOT DETECTED NOT DETECTED   Enterococcus Faecium NOT DETECTED NOT DETECTED   Listeria monocytogenes NOT DETECTED NOT DETECTED   Staphylococcus species NOT DETECTED NOT DETECTED   Staphylococcus aureus (BCID) NOT DETECTED NOT DETECTED   Staphylococcus epidermidis NOT DETECTED NOT DETECTED   Staphylococcus lugdunensis NOT DETECTED NOT DETECTED   Streptococcus species NOT DETECTED NOT DETECTED   Streptococcus agalactiae NOT DETECTED NOT DETECTED   Streptococcus pneumoniae NOT DETECTED NOT DETECTED   Streptococcus pyogenes NOT DETECTED NOT DETECTED   A.calcoaceticus-baumannii NOT DETECTED NOT DETECTED   Bacteroides fragilis NOT DETECTED NOT DETECTED   Enterobacterales DETECTED (A) NOT DETECTED   Enterobacter cloacae complex NOT DETECTED NOT DETECTED   Escherichia coli DETECTED (A) NOT DETECTED   Klebsiella aerogenes NOT DETECTED NOT DETECTED   Klebsiella oxytoca NOT DETECTED NOT DETECTED   Klebsiella pneumoniae NOT DETECTED NOT DETECTED   Proteus species NOT DETECTED NOT DETECTED   Salmonella species NOT DETECTED NOT DETECTED   Serratia marcescens NOT DETECTED NOT DETECTED   Haemophilus influenzae NOT DETECTED NOT DETECTED   Neisseria meningitidis NOT DETECTED NOT DETECTED   Pseudomonas aeruginosa NOT DETECTED NOT DETECTED   Stenotrophomonas maltophilia NOT DETECTED NOT  DETECTED   Candida albicans NOT DETECTED NOT DETECTED   Candida auris NOT DETECTED NOT DETECTED   Candida glabrata NOT DETECTED NOT DETECTED   Candida krusei NOT DETECTED NOT DETECTED   Candida parapsilosis NOT DETECTED NOT DETECTED   Candida tropicalis NOT DETECTED NOT DETECTED   Cryptococcus neoformans/gattii NOT DETECTED NOT DETECTED   CTX-M ESBL NOT DETECTED NOT DETECTED   Carbapenem resistance IMP NOT DETECTED NOT DETECTED   Carbapenem resistance KPC NOT DETECTED NOT DETECTED   Carbapenem resistance NDM NOT DETECTED NOT DETECTED   Carbapenem resist OXA 48 LIKE NOT DETECTED NOT DETECTED   Carbapenem resistance VIM NOT DETECTED NOT DETECTED    Silvestre Drum, PharmD, BCPS Clinical Pharmacist Phone: [phone]

## 2023-07-18 NOTE — Progress Notes (Signed)
 Pharmacy Electrolyte Replacement  Recent Labs:  Recent Labs    07/18/23 0454  K 3.3*  MG 1.9  PHOS 2.9  CREATININE 1.37*    Low Critical Values (K </= 2.5, Phos </= 1, Mg </= 1) Present: None  MD Contacted: n/a  Plan: Kcl 10 mEq IV x4, Mag 2g IV x1    Diane Freeman, PharmD PGY1 Pharmacy Resident  Please check AMION for all [facility] Pharmacy phone numbers After 10:00 PM, call Main Pharmacy [phone]

## 2023-07-18 NOTE — TOC Initial Note (Signed)
 Transition of Care [facility]) - Initial/Assessment Note    Patient Details  Name: Diane Freeman MRN: [id number] Date of Birth: [birth year]  Transition of [facility]) CM/SW Contact:    Marliss Coots, LCSW Phone Number: 07/18/2023, 11:37 AM  Clinical Narrative:                  11:38 AM CSW introduced self and role to patient at bedside. Patient confirmed she resides at home alone but sister, Alexia Freestone, could provide transportation and home support upon discharge. Patient denied HH/SNF history and stated she has a "hand me down" walker at home. CSW completed TOC consult (advanced directive/primary point of contact person) as patient confirmed that sister, Alexia Freestone, is primary contact person and confirmed number on chart. Patient confirmed niece, Joice Lofts, on chart is secondary point of contact and confirmed contact information on chart.     Barriers to Discharge: Continued Medical Work up   Patient Goals and CMS Choice Patient states their goals for this hospitalization and ongoing recovery are:: to return home          Expected Discharge Plan and Services       Living arrangements for the past 2 months: Mobile Home                                      Prior Living Arrangements/Services Living arrangements for the past 2 months: Mobile Home Lives with:: Self Patient language and need for interpreter reviewed:: Yes Do you feel safe going back to the place where you live?: Yes            Criminal Activity/Legal Involvement Pertinent to Current Situation/Hospitalization: No - Comment as needed  Activities of Daily Living      Permission Sought/Granted Permission sought to share information with : Family Supports Permission granted to share information with : Yes, Verbal Permission Granted  Share Information with NAME: Eino Farber     Permission granted to share info w Relationship: Sister  Permission granted to share info w Contact Information:  [phone]  Emotional Assessment Appearance:: Appears stated age Attitude/Demeanor/Rapport: Engaged Affect (typically observed): Accepting, Adaptable, Pleasant, Appropriate, Calm, Stable Orientation: : Oriented to Self, Oriented to Place, Oriented to  Time, Oriented to Situation Alcohol / Substance Use: Not Applicable Psych Involvement: No (comment)  Admission diagnosis:  Hypokalemia [E87.6] Shock (HCC) [R57.9] Transaminitis [R74.01] AKI (acute kidney injury) (HCC) [N17.9] Sepsis with acute organ dysfunction without septic shock, due to unspecified organism, unspecified organ dysfunction type (HCC) [A41.9, R65.20] Patient Active Problem List   Diagnosis Date Noted   Shock (HCC) 07/17/2023   AKI (acute kidney injury) (HCC) 07/17/2023   Hematemesis 06/09/2023   Other fatigue 12/30/2022   Physical deconditioning 12/06/2022   Paroxysmal atrial fibrillation with RVR (HCC) 12/05/2022   Sepsis with acute organ dysfunction without septic shock (HCC) 12/03/2022   Actinic keratoses 02/17/2018   Hyperlipidemia 01/24/2018   Diastolic CHF (HCC) 06/14/2013   Crohn's disease (HCC) 06/10/2013   Hypokalemia 06/02/2011   ESOPHAGEAL STRICTURE 11/27/2007   Gastroesophageal reflux disease 11/27/2007   PCP:  Para March, DO Pharmacy:   [id number] [place], [place] - [address] [place] [address] Phone: [phone] Fax: [phone]  [facility] [address] [place] [address] Phone: [phone] Fax: [phone]  Social Drivers of Health (SDOH) Social History: SDOH Screenings   Food Insecurity: No Food Insecurity (07/18/2023)  Housing: Low Risk  (07/18/2023)  Transportation Needs: No Transportation Needs (07/18/2023)  Utilities: Not At Risk (07/18/2023)  Alcohol Screen: Low Risk  (12/31/2022)  Depression (PHQ2-9): Medium Risk (06/14/2023)  Financial Resource Strain:  Low Risk  (12/31/2022)  Physical Activity: Insufficiently Active (12/31/2022)  Social Connections: Moderately Isolated (07/18/2023)  Stress: No Stress Concern Present (12/31/2022)  Tobacco Use: Low Risk  (07/12/2023)  Health Literacy: Adequate Health Literacy (12/31/2022)   SDOH Interventions:     Readmission Risk Interventions    06/11/2023   11:24 AM  Readmission Risk Prevention Plan  Post Dischage Appt Complete  Medication Screening Complete  Transportation Screening Complete

## 2023-07-18 NOTE — Progress Notes (Signed)
 PHARMACY - ANTICOAGULATION CONSULT NOTE  Pharmacy Consult for heparin drip Indication: atrial fibrillation and DVT  No Known Allergies  Patient Measurements: Height: 5\' 3"  (160 cm) Weight: 63.3 kg (139 lb 8.8 oz) IBW/kg (Calculated) : 52.4 HEPARIN DW (KG): 63.3  Vital Signs: Temp: 96.9 Freeman (36.1 C) (04/17 0758) Temp Source: Axillary (04/17 0758) BP: 90/60 (04/17 0800) Pulse Rate: 69 (04/17 0800)  Labs: Recent Labs    07/17/23 1253 07/17/23 1259 07/17/23 1334 07/17/23 1517 07/17/23 1923 07/18/23 0454  HGB 11.1* 11.6*  --  10.9* 10.5* 10.2*  HCT 34.9* 34.0*  --  32.0* 32.8* 31.2*  PLT 127*  --   --   --  220 133*  APTT  --   --  29  --   --   --   LABPROT  --   --  19.8*  --   --   --   INR  --   --  1.7*  --   --   --   CREATININE 1.87* 1.70*  --   --  1.58*  1.64* 1.37*  CKTOTAL 91  --   --   --   --   --   TROPONINIHS 49*  --   --   --  125* 77*    Estimated Creatinine Clearance: 29.9 mL/min (A) (by C-G formula based on SCr of 1.37 mg/dL (H)).   Medical History: Past Medical History:  Diagnosis Date   Abdominal pain 12/03/2022   Adrenal nodule (HCC)    Anemia    Blood transfusion without reported diagnosis    Cataract    bil removed   Choledocholithiasis with obstruction    Colitis    Crohn's colitis (HCC) 07/11/2012   Diastolic dysfunction    a. 05/2013 Echo: EF 60-65%, no rwma, GrI DD, mild-mod TR.   Diverticulosis    Elevated blood pressure reading 02/21/2021   Fatty liver    GERD (gastroesophageal reflux disease)    Hiatal hernia    History of esophageal stricture    History of left heart catheterization    a. 05/2013 Cath: Nl cors. Nl R heart filling pressures.  No L -> R shunt.  EF 65%.   Hx of gastritis    Lower extremity edema 12/03/2022   Palpitations    a. 09/2014 Zio: Rare PACs/PVCs.   Pyelonephritis 05/29/2022   Right shoulder pain 12/03/2022   Sepsis (HCC) 12/03/2022   UTI (urinary tract infection) 05/14/2013    Medications:   Scheduled:   Chlorhexidine Gluconate Cloth  6 each Topical Daily   mupirocin ointment  1 Application Nasal BID   nystatin   Topical TID   pantoprazole  40 mg Oral BID   Infusions:   sodium chloride 10 mL/hr at 07/18/23 0600   [START ON 07/19/2023] cefTRIAXone (ROCEPHIN)  IV     norepinephrine (LEVOPHED) Adult infusion 3 mcg/min (07/18/23 0600)   phenylephrine (NEO-SYNEPHRINE) Adult infusion     sodium chloride      Assessment: Diane Freeman presenting with sepsis. PMH significant for Afib, HFpEF. Not on anticoagulation PTA. Pharmacy consulted for heparin management.  Started on enoxaparin for DVT, last dose 4/16 at 2230. Now transitioning to heparin drip.   Hgb 10.2, plts 133. No bleeding on physical exam. Will avoid bolus with recent dose of enoxaparin.   Goal of Therapy:  Heparin level 0.3-0.7 units/ml Monitor platelets by anticoagulation protocol: Yes   Plan:  Start heparin drip at 1000 units/hr Check 8 hour heparin  level Monitor daily heparin level, CBC, and signs/symptoms of bleeding   Diane Freeman B Diane Freeman 07/18/2023,10:56 AM

## 2023-07-18 NOTE — Progress Notes (Signed)
 NAME:  Diane Freeman, MRN:  [id number], DOB:  1946/02/18, LOS: 1 ADMISSION DATE:  07/17/2023, CONSULTATION DATE:  07/17/2023 REFERRING MD:  EDP, CHIEF COMPLAINT: Generalized weakness  History of Present Illness:   This patient was initially admitted and discharged on 06/11/2023 for upper GI bleed.  EGD showed reflux esophagitis with no bleeding and negative biopsy for H. pylori.  She presented today via EMS due to concern for generalized weakness for 1 day.  Her sister reports she went to check up on her because she has not picked up her phone attempts and found her altered and had vomited twice.  Patient was hemodynamically unstable with blood pressure of 95/49 and tachypneic 26, initiated on Levophed 10 and admitted to  the ICU.   Pertinent  Medical History   Past Medical History:  Diagnosis Date   Abdominal pain 12/03/2022   Adrenal nodule (HCC)    Anemia    Blood transfusion without reported diagnosis    Cataract    bil removed   Choledocholithiasis with obstruction    Colitis    Crohn's colitis (HCC) 07/11/2012   Diastolic dysfunction    a. 05/2013 Echo: EF 60-65%, no rwma, GrI DD, mild-mod TR.   Diverticulosis    Elevated blood pressure reading 02/21/2021   Fatty liver    GERD (gastroesophageal reflux disease)    Hiatal hernia    History of esophageal stricture    History of left heart catheterization    a. 05/2013 Cath: Nl cors. Nl R heart filling pressures.  No L -> R shunt.  EF 65%.   Hx of gastritis    Lower extremity edema 12/03/2022   Palpitations    a. 09/2014 Zio: Rare PACs/PVCs.   Pyelonephritis 05/29/2022   Right shoulder pain 12/03/2022   Sepsis (HCC) 12/03/2022   UTI (urinary tract infection) 05/14/2013     Significant Hospital Events: Including procedures, antibiotic start and stop dates in addition to other pertinent events    4/16  Acute deep vein thrombosis involving the right common femoral vein, right femoral vein, right popliteal vein, right  posterior tibial veins, and right peroneal veins .    Interim History / Subjective:  4/16 Admitted   Objective   Blood pressure 90/60, pulse 69, temperature (!) 96.9 F (36.1 C), temperature source Axillary, resp. rate (!) 25, weight 63.3 kg, SpO2 98%. CVP:  [5 mmHg-14 mmHg] 6 mmHg Levo 3     Intake/Output Summary (Last 24 hours) at 07/18/2023 0954 Last data filed at 07/18/2023 0600 Gross per 24 hour  Intake 7219.08 ml  Output 300 ml  Net 6919.08 ml   Filed Weights   07/18/23 0500  Weight: 63.3 kg    Examination: General: Awake laying in bed  HENT: Normocephalic , atraumatic  Lungs: Clear to ascultation  Cardiovascular: RRR Abdomen: Soft and non tender  Extremities: warm and dry  Neuro: A&O X3   Resolved Hospital Problem list     Assessment & Plan:  78 year old female is admitted with sepsis secondary to e coli bacteremia on antibiotics and pressors. No clear evidence of ACS at this point.  #Acute metabolic encephalopathy #Early dementia - Markedly improved - Delirium precautions  #E. coli bacteremia #Lactic acidosis secondary to shock #Cystitis - Still hypotensive, Tachypneic and hypothermic of 96.9 - Discontinue vancomycin and azithromycin - Continue ceftriaxone for 7-day course - Trend white count  #A-fib - On no anticoagulation due to recent upper GI bleed  # Hypotension - Likely in the setting  of septic shock - On Levophed 10, target MAP goal of greater than 65  #Recent UGIB - On home sucralfate and PPI - Resume when appropriate  # Electrolyte derangement, hypokalemia, hypomagnesemia, hypocalcemia - Trend and replete as needed  #History of Crohn's disease - Resume mesalamine when able  Best Practice (right click and "Reselect all SmartList Selections" daily)   Diet/type: Regular consistency (see orders) DVT prophylaxis: not indicated GI prophylaxis: N/A Lines: Arterial Line Foley:  Yes, and it is still needed Code Status:  full  code Last date of multidisciplinary goals of care discussion [07/18/2023]  Labs   CBC: Recent Labs  Lab 07/17/23 1253 07/17/23 1259 07/17/23 1517 07/17/23 1923 07/18/23 0454  WBC 14.4*  --   --  48.4* 33.0*  NEUTROABS 13.4*  --   --   --   --   HGB 11.1* 11.6* 10.9* 10.5* 10.2*  HCT 34.9* 34.0* 32.0* 32.8* 31.2*  MCV 85.3  --   --  84.1 82.3  PLT 127*  --   --  220 133*    Basic Metabolic Panel: Recent Labs  Lab 07/17/23 1253 07/17/23 1259 07/17/23 1517 07/17/23 1923 07/18/23 0454  NA 141 142 143 137 137  K 2.6* 2.6* 2.4* 2.8* 3.3*  CL 107 107  --  106 107  CO2 16*  --   --  16* 17*  GLUCOSE 73 69*  --  129* 117*  BUN 13 13  --  12 12  CREATININE 1.87* 1.70*  --  1.58*  1.64* 1.37*  CALCIUM 7.5*  --   --  7.6* 7.4*  MG 1.4*  --   --  1.9 1.9  PHOS  --   --   --  3.1 2.9   GFR: Estimated Creatinine Clearance: 29.9 mL/min (A) (by C-G formula based on SCr of 1.37 mg/dL (H)). Recent Labs  Lab 07/17/23 1253 07/17/23 1259 07/17/23 1517 07/17/23 1923 07/17/23 2133 07/17/23 2352 07/18/23 0454  PROCALCITON  --   --   --  23.51  --   --   --   WBC 14.4*  --   --  48.4*  --   --  33.0*  LATICACIDVEN  --  8.2* 9.6*  --  6.3* 6.1*  --     Liver Function Tests: Recent Labs  Lab 07/17/23 1253  AST 294*  ALT 64*  ALKPHOS 117  BILITOT 3.3*  PROT 5.7*  ALBUMIN 1.8*   Recent Labs  Lab 07/17/23 1253  LIPASE 37   No results for input(s): "AMMONIA" in the last 168 hours.  ABG    Component Value Date/Time   PHART 7.442 06/15/2013 1307   PCO2ART 31.7 (L) 06/15/2013 1307   PO2ART 59.0 (L) 06/15/2013 1307   HCO3 15.5 (L) 07/17/2023 1517   TCO2 16 (L) 07/17/2023 1517   ACIDBASEDEF 10.0 (H) 07/17/2023 1517   O2SAT 77 07/17/2023 1517     Coagulation Profile: Recent Labs  Lab 07/17/23 1334  INR 1.7*    Cardiac Enzymes: Recent Labs  Lab 07/17/23 1253  CKTOTAL 91    HbA1C: Hemoglobin A1C  Date/Time Value Ref Range Status  06/04/2023 10:15 AM 5.3  4.0 - 5.6 % Final  08/19/2014 08:37 AM 5.0  Final   Hgb A1c MFr Bld  Date/Time Value Ref Range Status  06/12/2013 10:59 PM 6.1 (H) <5.7 % Final    Comment:    (NOTE)  According to the ADA Clinical Practice Recommendations for 2011, when HbA1c is used as a screening test:  >=6.5%   Diagnostic of Diabetes Mellitus           (if abnormal result is confirmed) 5.7-6.4%   Increased risk of developing Diabetes Mellitus References:Diagnosis and Classification of Diabetes Mellitus,Diabetes Care,2011,34(Suppl 1):S62-S69 and Standards of Medical Care in         Diabetes - 2011,Diabetes Care,2011,34 (Suppl 1):S11-S61.  11/16/2009 09:44 AM 5.0 %     CBG: Recent Labs  Lab 07/17/23 1707 07/17/23 1939 07/17/23 2331 07/18/23 0354 07/18/23 0800  GLUCAP 57* 113* 105* 79 99    Review of Systems:     Past Medical History:  She,  has a past medical history of Abdominal pain (12/03/2022), Adrenal nodule (HCC), Anemia, Blood transfusion without reported diagnosis, Cataract, Choledocholithiasis with obstruction, Colitis, Crohn's colitis (HCC) (07/11/2012), Diastolic dysfunction, Diverticulosis, Elevated blood pressure reading (02/21/2021), Fatty liver, GERD (gastroesophageal reflux disease), Hiatal hernia, History of esophageal stricture, History of left heart catheterization, gastritis, Lower extremity edema (12/03/2022), Palpitations, Pyelonephritis (05/29/2022), Right shoulder pain (12/03/2022), Sepsis (HCC) (12/03/2022), and UTI (urinary tract infection) (05/14/2013).   Surgical History:   Past Surgical History:  Procedure Laterality Date   CARDIAC CATHETERIZATION  05/2013   CATARACT EXTRACTION Bilateral    CHOLECYSTECTOMY  2009   COLONOSCOPY  last 09/03/2017, 2020   ERCP  06/02/2011   Procedure: ENDOSCOPIC RETROGRADE CHOLANGIOPANCREATOGRAPHY (ERCP);  Surgeon: Kenney Peacemaker, MD;  Location: [facility];  Service: Endoscopy;   Laterality: N/A;   ESOPHAGOGASTRODUODENOSCOPY N/A 06/10/2023   Procedure: EGD (ESOPHAGOGASTRODUODENOSCOPY);  Surgeon: Elois Hair, MD;  Location: [facility] ENDOSCOPY;  Service: Gastroenterology;  Laterality: N/A;   HEMOSTASIS CLIP PLACEMENT  06/10/2023   Procedure: CONTROL OF HEMORRHAGE, GI TRACT, ENDOSCOPIC, BY CLIPPING OR OVERSEWING;  Surgeon: Elois Hair, MD;  Location: [facility] ENDOSCOPY;  Service: Gastroenterology;;   LEFT AND RIGHT HEART CATHETERIZATION WITH CORONARY ANGIOGRAM N/A 06/15/2013   Procedure: LEFT AND RIGHT HEART CATHETERIZATION WITH CORONARY ANGIOGRAM- Shunt series;  Surgeon: Arlander Bellman, MD;  Location: [facility] CATH LAB;  Service: Cardiovascular;  Laterality: N/A;   TUBAL LIGATION     UPPER GASTROINTESTINAL ENDOSCOPY       Social History:   reports that she has never smoked. She has never used smokeless tobacco. She reports that she does not drink alcohol and does not use drugs.   Family History:  Her family history includes Bone cancer in her maternal grandmother; Breast cancer in her sister; Colon cancer (age of onset: 9) in her maternal aunt; Stomach cancer (age of onset: 63) in her sister. There is no history of Esophageal cancer, Rectal cancer, Pancreatic cancer, or Colon polyps.   Allergies No Known Allergies   Home Medications  Prior to Admission medications   Medication Sig Start Date End Date Taking? Authorizing Provider  mesalamine (APRISO) 0.375 g 24 hr capsule Take 4 capsules (1.5 g total) by mouth daily. You are overdue for an office visit. Please schedule an office visit as soon as possible and for any further refills. Thank you. Patient taking differently: Take 3 capsules by mouth daily. 06/26/23  Yes Armbruster, Lendon Queen, MD  pantoprazole (PROTONIX) 40 MG tablet TAKE 1 TABLET BY MOUTH TWICE A DAY Patient taking differently: Take 40 mg by mouth daily. 07/17/23  Yes Everhart, Kirstie, DO  nystatin (MYCOSTATIN/NYSTOP) powder Apply 1 Application topically 3  (three) times daily. Patient not taking: Reported on 07/12/2023 06/14/23   Genora Kidd, MD  ondansetron (ZOFRAN) 4 MG tablet Take 1 tablet (4 mg total) by mouth every 6 (six) hours as needed for nausea. Patient not taking: Reported on 07/12/2023 06/11/23   Lavada Porteous, DO  sucralfate (CARAFATE) 1 GM/10ML suspension Take 10 mLs (1 g total) by mouth 4 (four) times daily -  with meals and at bedtime for 7 days. Patient not taking: Reported on 07/12/2023 06/11/23 06/20/23  Lavada Porteous, DO     Critical care time: 85 Minutes     Fay Hoop MD [facility] Internal Medicine Program - PGY-1 07/18/2023, 9:54 AM Pager# [phone]

## 2023-07-18 NOTE — Progress Notes (Signed)
 PHARMACY - ANTICOAGULATION CONSULT NOTE  Pharmacy Consult for heparin drip Indication: atrial fibrillation and DVT  No Known Allergies  Patient Measurements: Height: 5\' 3"  (160 cm) Weight: 63.3 kg (139 lb 8.8 oz) IBW/kg (Calculated) : 52.4 HEPARIN DW (KG): 63.3  Vital Signs: Temp: 97.3 F (36.3 C) (04/17 1943) Temp Source: Axillary (04/17 1943) BP: 108/63 (04/17 2000) Pulse Rate: 79 (04/17 2015)  Labs: Recent Labs    07/17/23 1253 07/17/23 1259 07/17/23 1334 07/17/23 1517 07/17/23 1923 07/18/23 0454 07/18/23 1950  HGB 11.1* 11.6*  --  10.9* 10.5* 10.2*  --   HCT 34.9* 34.0*  --  32.0* 32.8* 31.2*  --   PLT 127*  --   --   --  220 133*  --   APTT  --   --  29  --   --   --   --   LABPROT  --   --  19.8*  --   --   --   --   INR  --   --  1.7*  --   --   --   --   HEPARINUNFRC  --   --   --   --   --   --  0.14*  CREATININE 1.87* 1.70*  --   --  1.58*  1.64* 1.37*  --   CKTOTAL 91  --   --   --   --   --   --   TROPONINIHS 49*  --   --   --  125* 77*  --     Estimated Creatinine Clearance: 29.9 mL/min (A) (by C-G formula based on SCr of 1.37 mg/dL (H)).   Medical History: Past Medical History:  Diagnosis Date   Abdominal pain 12/03/2022   Adrenal nodule (HCC)    Anemia    Blood transfusion without reported diagnosis    Cataract    bil removed   Choledocholithiasis with obstruction    Colitis    Crohn's colitis (HCC) 07/11/2012   Diastolic dysfunction    a. 05/2013 Echo: EF 60-65%, no rwma, GrI DD, mild-mod TR.   Diverticulosis    Elevated blood pressure reading 02/21/2021   Fatty liver    GERD (gastroesophageal reflux disease)    Hiatal hernia    History of esophageal stricture    History of left heart catheterization    a. 05/2013 Cath: Nl cors. Nl R heart filling pressures.  No L -> R shunt.  EF 65%.   Hx of gastritis    Lower extremity edema 12/03/2022   Palpitations    a. 09/2014 Zio: Rare PACs/PVCs.   Pyelonephritis 05/29/2022   Right shoulder  pain 12/03/2022   Sepsis (HCC) 12/03/2022   UTI (urinary tract infection) 05/14/2013    Medications:  Scheduled:   Chlorhexidine Gluconate Cloth  6 each Topical Daily   mupirocin ointment  1 Application Nasal BID   nystatin   Topical TID   pantoprazole  40 mg Oral BID   Infusions:   [START ON 07/19/2023] cefTRIAXone (ROCEPHIN)  IV     heparin 1,000 Units/hr (07/18/23 2000)   lactated ringers 100 mL/hr at 07/18/23 2000   norepinephrine (LEVOPHED) Adult infusion Stopped (07/18/23 1744)    Assessment: 78 yo F presenting with sepsis. PMH significant for Afib, HFpEF. Not on anticoagulation PTA. Pharmacy consulted for heparin management with acute DVT.  Initial heparin level is subtherapeutic at 0.14, no bleeding or infusion issues per RN.  Goal of Therapy:  Heparin level 0.3-0.7 units/ml Monitor platelets by anticoagulation protocol: Yes   Plan:  Heparin 1500 units x1 Increase heparin to 1200 units/h Repeat heparin level in 8h  Levin Reamer, PharmD, [place], [facility] Clinical Pharmacist [phone] Please check AMION for all [facility] Pharmacy numbers 07/18/2023

## 2023-07-18 NOTE — Progress Notes (Signed)
 Pt unable to void.  Pt straight-cathed, return of 300 mL of dark brownish/yellowish urine.  Sent in sterile containers for ordered lab studies.

## 2023-07-19 DIAGNOSIS — N3001 Acute cystitis with hematuria: Secondary | ICD-10-CM

## 2023-07-19 DIAGNOSIS — A4151 Sepsis due to Escherichia coli [E. coli]: Secondary | ICD-10-CM | POA: Diagnosis not present

## 2023-07-19 DIAGNOSIS — R6521 Severe sepsis with septic shock: Secondary | ICD-10-CM | POA: Diagnosis not present

## 2023-07-19 DIAGNOSIS — E876 Hypokalemia: Secondary | ICD-10-CM | POA: Diagnosis not present

## 2023-07-19 LAB — CBC
HCT: 30.6 % — ABNORMAL LOW (ref 36.0–46.0)
Hemoglobin: 10 g/dL — ABNORMAL LOW (ref 12.0–15.0)
MCH: 26.8 pg (ref 26.0–34.0)
MCHC: 32.7 g/dL (ref 30.0–36.0)
MCV: 82 fL (ref 80.0–100.0)
Platelets: 119 10*3/uL — ABNORMAL LOW (ref 150–400)
RBC: 3.73 MIL/uL — ABNORMAL LOW (ref 3.87–5.11)
RDW: 16.8 % — ABNORMAL HIGH (ref 11.5–15.5)
WBC: 17.7 10*3/uL — ABNORMAL HIGH (ref 4.0–10.5)
nRBC: 0 % (ref 0.0–0.2)

## 2023-07-19 LAB — BASIC METABOLIC PANEL WITH GFR
Anion gap: 9 (ref 5–15)
BUN: 14 mg/dL (ref 8–23)
CO2: 21 mmol/L — ABNORMAL LOW (ref 22–32)
Calcium: 7.2 mg/dL — ABNORMAL LOW (ref 8.9–10.3)
Chloride: 106 mmol/L (ref 98–111)
Creatinine, Ser: 1.15 mg/dL — ABNORMAL HIGH (ref 0.44–1.00)
GFR, Estimated: 48 mL/min — ABNORMAL LOW (ref >60)
Glucose, Bld: 107 mg/dL — ABNORMAL HIGH (ref 70–99)
Potassium: 3.4 mmol/L — ABNORMAL LOW (ref 3.5–5.1)
Sodium: 136 mmol/L (ref 135–145)

## 2023-07-19 LAB — GLUCOSE, CAPILLARY
Glucose-Capillary: 102 mg/dL — ABNORMAL HIGH (ref 70–99)
Glucose-Capillary: 91 mg/dL (ref 70–99)
Glucose-Capillary: 99 mg/dL (ref 70–99)
Glucose-Capillary: 98 mg/dL (ref 70–99)

## 2023-07-19 LAB — CULTURE, BLOOD (ROUTINE X 2)

## 2023-07-19 LAB — CALCIUM, IONIZED: Calcium, Ionized, Serum: 4.2 mg/dL — ABNORMAL LOW (ref 4.5–5.6)

## 2023-07-19 LAB — MAGNESIUM: Magnesium: 2.2 mg/dL (ref 1.7–2.4)

## 2023-07-19 LAB — HEPARIN LEVEL (UNFRACTIONATED)
Heparin Unfractionated: 0.19 [IU]/mL — ABNORMAL LOW (ref 0.30–0.70)
Heparin Unfractionated: 0.22 [IU]/mL — ABNORMAL LOW (ref 0.30–0.70)

## 2023-07-19 MED ORDER — FUROSEMIDE 10 MG/ML IJ SOLN
40.0000 mg | Freq: Once | INTRAMUSCULAR | Status: AC
  Administered 2023-07-19: 40 mg via INTRAVENOUS
  Filled 2023-07-19: qty 4

## 2023-07-19 MED ORDER — HEPARIN BOLUS VIA INFUSION
2000.0000 [IU] | Freq: Once | INTRAVENOUS | Status: AC
  Administered 2023-07-19: 2000 [IU] via INTRAVENOUS
  Filled 2023-07-19: qty 2000

## 2023-07-19 MED ORDER — SUCRALFATE 1 G PO TABS
1.0000 g | ORAL_TABLET | Freq: Three times a day (TID) | ORAL | Status: DC
  Administered 2023-07-19 – 2023-07-24 (×19): 1 g via ORAL
  Filled 2023-07-19 (×21): qty 1

## 2023-07-19 MED ORDER — HEPARIN BOLUS VIA INFUSION
1000.0000 [IU] | Freq: Once | INTRAVENOUS | Status: AC
  Administered 2023-07-19: 1000 [IU] via INTRAVENOUS
  Filled 2023-07-19: qty 1000

## 2023-07-19 MED ORDER — MESALAMINE ER 250 MG PO CPCR
1500.0000 mg | ORAL_CAPSULE | Freq: Every day | ORAL | Status: DC
  Administered 2023-07-19 – 2023-07-24 (×6): 1500 mg via ORAL
  Filled 2023-07-19 (×6): qty 6

## 2023-07-19 MED ORDER — MIDODRINE HCL 5 MG PO TABS
5.0000 mg | ORAL_TABLET | Freq: Three times a day (TID) | ORAL | Status: DC
  Administered 2023-07-19 – 2023-07-21 (×6): 5 mg via ORAL
  Filled 2023-07-19 (×7): qty 1

## 2023-07-19 MED ORDER — POTASSIUM CHLORIDE CRYS ER 20 MEQ PO TBCR
40.0000 meq | EXTENDED_RELEASE_TABLET | Freq: Once | ORAL | Status: AC
  Administered 2023-07-19: 40 meq via ORAL
  Filled 2023-07-19: qty 2

## 2023-07-19 NOTE — Plan of Care (Signed)
   Problem: Clinical Measurements: Goal: Ability to maintain clinical measurements within normal limits will improve Outcome: Progressing

## 2023-07-19 NOTE — Progress Notes (Signed)
 PHARMACY - ANTICOAGULATION Pharmacy Consult for heparin   Indication: atrial fibrillation and DVT Brief A/P: Heparin  level subtherapeutic Increase Heparin  rate  No Known Allergies  Patient Measurements: Height: 5\' 3"  (160 cm) Weight: 67.6 kg (149 lb 0.5 oz) IBW/kg (Calculated) : 52.4 HEPARIN  DW (KG): 63.3  Vital Signs: Temp: 97.4 F (36.3 C) (04/18 1531) Temp Source: Oral (04/18 1531) BP: 141/68 (04/18 1500) Pulse Rate: 73 (04/18 1500)  Labs: Recent Labs    07/17/23 1253 07/17/23 1259 07/17/23 1334 07/17/23 1517 07/17/23 1923 07/18/23 0454 07/18/23 1950 07/19/23 0444 07/19/23 1513  HGB 11.1*   < >  --    < > 10.5* 10.2*  --  10.0*  --   HCT 34.9*   < >  --    < > 32.8* 31.2*  --  30.6*  --   PLT 127*  --   --   --  220 133*  --  119*  --   APTT  --   --  29  --   --   --   --   --   --   LABPROT  --   --  19.8*  --   --   --   --   --   --   INR  --   --  1.7*  --   --   --   --   --   --   HEPARINUNFRC  --   --   --   --   --   --  0.14* 0.19* 0.22*  CREATININE 1.87*   < >  --   --  1.58*  1.64* 1.37*  --  1.15*  --   CKTOTAL 91  --   --   --   --   --   --   --   --   TROPONINIHS 49*  --   --   --  125* 77*  --   --   --    < > = values in this interval not displayed.    Estimated Creatinine Clearance: 36.6 mL/min (A) (by C-G formula based on SCr of 1.15 mg/dL (H)).  Assessment: 78 y.o. female with new DVT for heparin .  Heparin  level 0.22 - subtherapeutic on heparin  1350 units/hr. Hgb 10, Plts 119. No issues with the infusion or overt bleeding reported per RN; does note some oozing from dressings.  Goal of Therapy:  Heparin  level 0.3-0.7 units/ml Monitor platelets by anticoagulation protocol: Yes   Plan:  Heparin  1000 units IV bolus, then increase heparin   1500 units/hr Check heparin  level in 8 hours.   Armanda Bern, PharmD, BCPS 07/19/2023

## 2023-07-19 NOTE — Progress Notes (Signed)
 NAME:  Diane Freeman, MRN:  [id number], DOB:  1945-05-08, LOS: 2 ADMISSION DATE:  07/17/2023, CONSULTATION DATE:  07/17/2023 REFERRING MD:  EDP, CHIEF COMPLAINT: Generalized weakness  History of Present Illness:   This patient was initially admitted and discharged on 06/11/2023 for upper GI bleed.  EGD showed reflux esophagitis with no bleeding and negative biopsy for H. pylori.  She presented today via EMS due to concern for generalized weakness for 1 day.  Her sister reports she went to check up on her because she has not picked up her phone attempts and found her altered and had vomited twice.  Patient was hemodynamically unstable with blood pressure of 95/49 and tachypneic 26, initiated on Levophed  10 and admitted to  the ICU.   Pertinent  Medical History   Past Medical History:  Diagnosis Date   Abdominal pain 12/03/2022   Adrenal nodule (HCC)    Anemia    Blood transfusion without reported diagnosis    Cataract    bil removed   Choledocholithiasis with obstruction    Colitis    Crohn's colitis (HCC) 07/11/2012   Diastolic dysfunction    a. 05/2013 Echo: EF 60-65%, no rwma, GrI DD, mild-mod TR.   Diverticulosis    Elevated blood pressure reading 02/21/2021   Fatty liver    GERD (gastroesophageal reflux disease)    Hiatal hernia    History of esophageal stricture    History of left heart catheterization    a. 05/2013 Cath: Nl cors. Nl R heart filling pressures.  No L -> R shunt.  EF 65%.   Hx of gastritis    Lower extremity edema 12/03/2022   Palpitations    a. 09/2014 Zio: Rare PACs/PVCs.   Pyelonephritis 05/29/2022   Right shoulder pain 12/03/2022   Sepsis (HCC) 12/03/2022   UTI (urinary tract infection) 05/14/2013     Significant Hospital Events: Including procedures, antibiotic start and stop dates in addition to other pertinent events   4/16 admitted for septic shock & UTI ; Dvt confirmed   Interim History / Subjective:  Off NE this morning with borderline  MAPs. She denies complaints other than transient lightheadedness earlier that has resolved.  Objective   Blood pressure (!) 101/50, pulse 75, temperature (!) 97.5 F (36.4 C), temperature source Oral, resp. rate (!) 23, height 5\' 3"  (1.6 m), weight 67.6 kg, SpO2 97%. CVP:  [0 mmHg-11 mmHg] 9 mmHg Levo 3     Intake/Output Summary (Last 24 hours) at 07/19/2023 0957 Last data filed at 07/19/2023 0800 Gross per 24 hour  Intake 2771.07 ml  Output --  Net 2771.07 ml   Filed Weights   07/18/23 0500 07/18/23 1055 07/19/23 0500  Weight: 63.3 kg 63.3 kg 67.6 kg    Examination: General: elderly woman sitting up in bed eating ice cream in NAD HENT: [place]/AT, eyes anicteric  Lungs: breathing comfortably on RA, CTAB Cardiovascular: S1S2, RRR Abdomen: soft, NT  Extremities: warm, dry, no rashes Neuro: awake, alert, moving all extremities. Answering questions appropriately.   Urine culture pending Blood cultures: mostly sensitive E coli  BUN 14 Cr 1.15 WBC 11.7 H/H 10/30.6 Platelets 119   Resolved Hospital Problem list   hypomagnesemia  Assessment & Plan:  Septic shock due to E Coli UTI & bacteremia -con't ceftriaxone  -vasopressors as needed to maintain MAP >65 -midodrine  added -can d/c CVC later today or tomorrow if she stays off pressors  Thrombocytopenia due to sepsis -monitor   Hypokalemia -repleted, monitor  AKI  due to sepsis, improving -strict I/O -renally dose meds, avoid nephrotoxic meds  Acute metabolic encephalopathy- septic Early dementia -delirium precautions -TOC consulted for possible alternative placement at discharge since she lives alone  A-fib, paroxysmal. Currently in NSR. -heparin  gtt since 4/17 -not currently requiring rate control meds  Recent UGIB - con't PTA BID PPI - adding carafate  TID  History of Crohn's disease - Resume mesalamine  today -needs OP GI follow up  Deconditioning -PT, OT  Monitoring in the ICU today. Most likely stable  to transfer out of the ICU & d/c CVC later this afternoon if her BP remains appropriate.  Best Practice (right click and "Reselect all SmartList Selections" daily)   Diet/type: Regular consistency (see orders) DVT prophylaxis: not indicated GI prophylaxis: PPI Lines: Central line Foley:  Yes, and it is still needed Code Status:  full code Last date of multidisciplinary goals of care discussion [07/18/2023]  Labs   CBC: Recent Labs  Lab 07/17/23 1253 07/17/23 1259 07/17/23 1517 07/17/23 1923 07/18/23 0454 07/19/23 0444  WBC 14.4*  --   --  48.4* 33.0* 17.7*  NEUTROABS 13.4*  --   --   --   --   --   HGB 11.1* 11.6* 10.9* 10.5* 10.2* 10.0*  HCT 34.9* 34.0* 32.0* 32.8* 31.2* 30.6*  MCV 85.3  --   --  84.1 82.3 82.0  PLT 127*  --   --  220 133* 119*    Basic Metabolic Panel: Recent Labs  Lab 07/17/23 1253 07/17/23 1259 07/17/23 1517 07/17/23 1923 07/18/23 0454 07/19/23 0444  NA 141 142 143 137 137 136  K 2.6* 2.6* 2.4* 2.8* 3.3* 3.4*  CL 107 107  --  106 107 106  CO2 16*  --   --  16* 17* 21*  GLUCOSE 73 69*  --  129* 117* 107*  BUN 13 13  --  12 12 14   CREATININE 1.87* 1.70*  --  1.58*  1.64* 1.37* 1.15*  CALCIUM  7.5*  --   --  7.6* 7.4* 7.2*  MG 1.4*  --   --  1.9 1.9 2.2  PHOS  --   --   --  3.1 2.9  --    GFR: Estimated Creatinine Clearance: 36.6 mL/min (A) (by C-G formula based on SCr of 1.15 mg/dL (H)). Recent Labs  Lab 07/17/23 1253 07/17/23 1259 07/17/23 1923 07/17/23 2133 07/17/23 2352 07/18/23 0454 07/18/23 1110 07/18/23 1603 07/19/23 0444  PROCALCITON  --   --  23.51  --   --   --   --   --   --   WBC 14.4*  --  48.4*  --   --  33.0*  --   --  17.7*  LATICACIDVEN  --    < >  --  6.3* 6.1*  --  3.1* 2.1*  --    < > = values in this interval not displayed.    Liver Function Tests: Recent Labs  Lab 07/17/23 1253  AST 294*  ALT 64*  ALKPHOS 117  BILITOT 3.3*  PROT 5.7*  ALBUMIN 1.8*   Recent Labs  Lab 07/17/23 1253  LIPASE 37      Critical care time:       Joesph Mussel, DO 07/19/23 2:51 PM [facility]  For contact information, see Amion. If no response to pager, please call PCCM consult pager. After hours, 7PM- 7AM, please call Elink.

## 2023-07-19 NOTE — Progress Notes (Signed)
 PHARMACY - ANTICOAGULATION Pharmacy Consult for heparin   Indication: atrial fibrillation and DVT Brief A/P: Heparin  level subtherapeutic Increase Heparin  rate  No Known Allergies  Patient Measurements: Height: 5\' 3"  (160 cm) Weight: 67.6 kg (149 lb 0.5 oz) IBW/kg (Calculated) : 52.4 HEPARIN  DW (KG): 63.3  Vital Signs: Temp: 97.4 F (36.3 C) (04/18 0335) Temp Source: Oral (04/18 0335) BP: 125/78 (04/18 0500) Pulse Rate: 73 (04/18 0500)  Labs: Recent Labs    07/17/23 1253 07/17/23 1259 07/17/23 1334 07/17/23 1517 07/17/23 1923 07/18/23 0454 07/18/23 1950 07/19/23 0444  HGB 11.1*   < >  --    < > 10.5* 10.2*  --  10.0*  HCT 34.9*   < >  --    < > 32.8* 31.2*  --  30.6*  PLT 127*  --   --   --  220 133*  --  119*  APTT  --   --  29  --   --   --   --   --   LABPROT  --   --  19.8*  --   --   --   --   --   INR  --   --  1.7*  --   --   --   --   --   HEPARINUNFRC  --   --   --   --   --   --  0.14* 0.19*  CREATININE 1.87*   < >  --   --  1.58*  1.64* 1.37*  --  1.15*  CKTOTAL 91  --   --   --   --   --   --   --   TROPONINIHS 49*  --   --   --  125* 77*  --   --    < > = values in this interval not displayed.    Estimated Creatinine Clearance: 36.6 mL/min (A) (by C-G formula based on SCr of 1.15 mg/dL (H)).  Assessment: 78 y.o. female with new DVT for heparin   Goal of Therapy:  Heparin  level 0.3-0.7 units/ml Monitor platelets by anticoagulation protocol: Yes   Plan:  Heparin  2000 units IV bolus, then increase heparin   1350 units/hr Check heparin  level in 8 hours.   Claudine Cullens, PharmD, BCPS . 07/19/2023

## 2023-07-20 DIAGNOSIS — R6520 Severe sepsis without septic shock: Secondary | ICD-10-CM | POA: Diagnosis not present

## 2023-07-20 DIAGNOSIS — A4151 Sepsis due to Escherichia coli [E. coli]: Secondary | ICD-10-CM

## 2023-07-20 DIAGNOSIS — R579 Shock, unspecified: Secondary | ICD-10-CM | POA: Diagnosis not present

## 2023-07-20 DIAGNOSIS — N179 Acute kidney failure, unspecified: Secondary | ICD-10-CM | POA: Diagnosis not present

## 2023-07-20 LAB — CBC
HCT: 32.3 % — ABNORMAL LOW (ref 36.0–46.0)
Hemoglobin: 10.2 g/dL — ABNORMAL LOW (ref 12.0–15.0)
MCH: 26.6 pg (ref 26.0–34.0)
MCHC: 31.6 g/dL (ref 30.0–36.0)
MCV: 84.1 fL (ref 80.0–100.0)
Platelets: 101 10*3/uL — ABNORMAL LOW (ref 150–400)
RBC: 3.84 MIL/uL — ABNORMAL LOW (ref 3.87–5.11)
RDW: 16.8 % — ABNORMAL HIGH (ref 11.5–15.5)
WBC: 8.8 10*3/uL (ref 4.0–10.5)
nRBC: 0 % (ref 0.0–0.2)

## 2023-07-20 LAB — BASIC METABOLIC PANEL WITH GFR
Anion gap: 7 (ref 5–15)
BUN: 17 mg/dL (ref 8–23)
CO2: 22 mmol/L (ref 22–32)
Calcium: 7.5 mg/dL — ABNORMAL LOW (ref 8.9–10.3)
Chloride: 106 mmol/L (ref 98–111)
Creatinine, Ser: 1.27 mg/dL — ABNORMAL HIGH (ref 0.44–1.00)
GFR, Estimated: 43 mL/min — ABNORMAL LOW (ref >60)
Glucose, Bld: 100 mg/dL — ABNORMAL HIGH (ref 70–99)
Potassium: 3.8 mmol/L (ref 3.5–5.1)
Sodium: 135 mmol/L (ref 135–145)

## 2023-07-20 LAB — HEPARIN LEVEL (UNFRACTIONATED)
Heparin Unfractionated: 0.45 [IU]/mL (ref 0.30–0.70)
Heparin Unfractionated: 0.35 [IU]/mL (ref 0.30–0.70)

## 2023-07-20 LAB — URINE CULTURE: Culture: >=100000 — AB

## 2023-07-20 LAB — MAGNESIUM: Magnesium: 2.2 mg/dL (ref 1.7–2.4)

## 2023-07-20 LAB — LEGIONELLA PNEUMOPHILA SEROGP 1 UR AG: L. pneumophila Serogp 1 Ur Ag: NEGATIVE

## 2023-07-20 MED ORDER — ENSURE ENLIVE PO LIQD
237.0000 mL | Freq: Two times a day (BID) | ORAL | Status: DC
  Administered 2023-07-20 – 2023-07-21 (×3): 237 mL via ORAL

## 2023-07-20 NOTE — Progress Notes (Signed)
 PHARMACY - ANTICOAGULATION Pharmacy Consult for heparin   Indication: atrial fibrillation and DVT Brief A/P: Heparin  level within goal range Continue Heparin  at current rate    No Known Allergies  Patient Measurements: Height: 5\' 3"  (160 cm) Weight: 67.6 kg (149 lb 0.5 oz) IBW/kg (Calculated) : 52.4 HEPARIN  DW (KG): 63.3  Vital Signs: Temp: 97.5 F (36.4 C) (04/18 2008) Temp Source: Oral (04/18 1531) BP: 115/76 (04/18 2258) Pulse Rate: 75 (04/18 2258)  Labs: Recent Labs    07/17/23 1253 07/17/23 1259 07/17/23 1334 07/17/23 1517 07/17/23 1923 07/18/23 0454 07/18/23 1950 07/19/23 0444 07/19/23 1513 07/20/23 0031  HGB 11.1*   < >  --    < > 10.5* 10.2*  --  10.0*  --   --   HCT 34.9*   < >  --    < > 32.8* 31.2*  --  30.6*  --   --   PLT 127*  --   --   --  220 133*  --  119*  --   --   APTT  --   --  29  --   --   --   --   --   --   --   LABPROT  --   --  19.8*  --   --   --   --   --   --   --   INR  --   --  1.7*  --   --   --   --   --   --   --   HEPARINUNFRC  --   --   --   --   --   --    < > 0.19* 0.22* 0.45  CREATININE 1.87*   < >  --   --  1.58*  1.64* 1.37*  --  1.15*  --   --   CKTOTAL 91  --   --   --   --   --   --   --   --   --   TROPONINIHS 49*  --   --   --  125* 77*  --   --   --   --    < > = values in this interval not displayed.    Estimated Creatinine Clearance: 36.6 mL/min (A) (by C-G formula based on SCr of 1.15 mg/dL (H)).  Assessment: 78 y.o. female with new DVT for heparin   Goal of Therapy:  Heparin  level 0.3-0.7 units/ml Monitor platelets by anticoagulation protocol: Yes   Plan:  No change to heparin   Follow-up am labs.   Claudine Cullens, PharmD, BCPS . 07/20/2023

## 2023-07-20 NOTE — Plan of Care (Signed)
 Assumed care at 1900. Patient has been resting comfortably in bed overnight. No complaints of pain but slight SOB. Pt has had BM overnight. Continuous Heparin  gtt. Continuous pulse ox in place. Fall prevention in place.  Problem: Education: Goal: Knowledge of General Education information will improve Description: Including pain rating scale, medication(s)/side effects and non-pharmacologic comfort measures Outcome: Progressing   Problem: Health Behavior/Discharge Planning: Goal: Ability to manage health-related needs will improve Outcome: Progressing   Problem: Clinical Measurements: Goal: Ability to maintain clinical measurements within normal limits will improve Outcome: Progressing Goal: Will remain free from infection Outcome: Progressing Goal: Diagnostic test results will improve Outcome: Progressing Goal: Respiratory complications will improve Outcome: Progressing Goal: Cardiovascular complication will be avoided Outcome: Progressing   Problem: Activity: Goal: Risk for activity intolerance will decrease Outcome: Progressing   Problem: Nutrition: Goal: Adequate nutrition will be maintained Outcome: Progressing   Problem: Coping: Goal: Level of anxiety will decrease Outcome: Progressing   Problem: Elimination: Goal: Will not experience complications related to bowel motility Outcome: Progressing Goal: Will not experience complications related to urinary retention Outcome: Progressing   Problem: Pain Managment: Goal: General experience of comfort will improve and/or be controlled Outcome: Progressing   Problem: Safety: Goal: Ability to remain free from injury will improve Outcome: Progressing   Problem: Skin Integrity: Goal: Risk for impaired skin integrity will decrease Outcome: Progressing

## 2023-07-20 NOTE — Progress Notes (Signed)
 PHARMACY - ANTICOAGULATION CONSULT NOTE  Pharmacy Consult for heparin  drip Indication: atrial fibrillation and DVT  No Known Allergies  Patient Measurements: Height: 5\' 3"  (160 cm) Weight: 70.2 kg (154 lb 12.2 oz) IBW/kg (Calculated) : 52.4 HEPARIN  DW (KG): 63.3  Vital Signs: Temp: 97.3 F (36.3 C) (04/19 0601) BP: 140/78 (04/19 0601) Pulse Rate: 73 (04/19 0601)  Labs: Recent Labs    07/17/23 1253 07/17/23 1259 07/17/23 1334 07/17/23 1517 07/17/23 1923 07/18/23 0454 07/18/23 1950 07/19/23 0444 07/19/23 1513 07/20/23 0031 07/20/23 0709  HGB 11.1*   < >  --    < > 10.5* 10.2*  --  10.0*  --   --  10.2*  HCT 34.9*   < >  --    < > 32.8* 31.2*  --  30.6*  --   --  32.3*  PLT 127*  --   --   --  220 133*  --  119*  --   --  101*  APTT  --   --  29  --   --   --   --   --   --   --   --   LABPROT  --   --  19.8*  --   --   --   --   --   --   --   --   INR  --   --  1.7*  --   --   --   --   --   --   --   --   HEPARINUNFRC  --   --   --   --   --   --    < > 0.19* 0.22* 0.45 0.35  CREATININE 1.87*   < >  --   --  1.58*  1.64* 1.37*  --  1.15*  --   --   --   CKTOTAL 91  --   --   --   --   --   --   --   --   --   --   TROPONINIHS 49*  --   --   --  125* 77*  --   --   --   --   --    < > = values in this interval not displayed.    Estimated Creatinine Clearance: 37.3 mL/min (A) (by C-G formula based on SCr of 1.15 mg/dL (H)).   Medical History: Past Medical History:  Diagnosis Date   Abdominal pain 12/03/2022   Adrenal nodule (HCC)    Anemia    Blood transfusion without reported diagnosis    Cataract    bil removed   Choledocholithiasis with obstruction    Colitis    Crohn's colitis (HCC) 07/11/2012   Diastolic dysfunction    a. 05/2013 Echo: EF 60-65%, no rwma, GrI DD, mild-mod TR.   Diverticulosis    Elevated blood pressure reading 02/21/2021   Fatty liver    GERD (gastroesophageal reflux disease)    Hiatal hernia    History of esophageal stricture     History of left heart catheterization    a. 05/2013 Cath: Nl cors. Nl R heart filling pressures.  No L -> R shunt.  EF 65%.   Hx of gastritis    Lower extremity edema 12/03/2022   Palpitations    a. 09/2014 Zio: Rare PACs/PVCs.   Pyelonephritis 05/29/2022   Right shoulder pain 12/03/2022   Sepsis (HCC) 12/03/2022   UTI (  urinary tract infection) 05/14/2013    Medications:  Scheduled:   Chlorhexidine  Gluconate Cloth  6 each Topical Daily   feeding supplement  237 mL Oral BID BM   mesalamine   1,500 mg Oral Daily   midodrine   5 mg Oral Q8H   mupirocin  ointment  1 Application Nasal BID   nystatin    Topical TID   pantoprazole   40 mg Oral BID   sucralfate   1 g Oral TID WC & HS   Infusions:   cefTRIAXone  (ROCEPHIN )  IV Stopped (07/19/23 1023)   heparin  1,500 Units/hr (07/20/23 0533)    Assessment: 78 yo F presenting with sepsis. PMH significant for Afib, HFpEF. Not on anticoagulation PTA. Pharmacy consulted for heparin  management.  Started on enoxaparin  for DVT, last dose 4/16 at 2230. Now transitioning to heparin  drip.   4/19 AM: Heparin  level remains therapeutic at 0.35 while on 1500 units/hr. Hgb stable, however PLT count decreased from 119 to 101. No issues with infusion and no overt bleeding reported per RN. Ozzing resolved from day prior.   Goal of Therapy:  Heparin  level 0.3-0.7 units/ml Monitor platelets by anticoagulation protocol: Yes   Plan:  Continue heparin  infusion at 1500 units/hr Monitor daily heparin  level, CBC, and signs/symptoms of bleeding  Juleen Oakland, PharmD PGY1 Pharmacy Resident 07/20/2023 8:03 AM

## 2023-07-20 NOTE — Evaluation (Signed)
 Occupational Therapy Evaluation Patient Details Name: Diane Freeman MRN: [id number] DOB: [birth year] Today's Date: 07/20/2023   History of Present Illness   Pt is a 78 y/o F presenting to ED on 4/16 with weakness, chills, prolonged heat exposure, admitted for septic shock. Found to have RLE DVT, started on heparin  4/17. PMH includes CHF, A fib RVR, chrohn's disease     Clinical Impressions Pt reports ind at baseline with ADLs/functional mobility, lives alone but reports her sister may be moving in with her soon. Pt currently needing set up - mod A for ADLs, CGA for bed mobility and CGA for transfers without AD, though pt does reach for countertop/IV pole for balance with ambulation in room. Pt on 2.5L O2 upon arrival, no DOE/SOB noted throughout session, RN notified. Pt presenting with impairments listed below, will follow acutely. Recommend HHOT at d/c.       If plan is discharge home, recommend the following:   A little help with walking and/or transfers;A little help with bathing/dressing/bathroom;Assistance with cooking/housework;Assist for transportation;Help with stairs or ramp for entrance     Functional Status Assessment   Patient has had a recent decline in their functional status and demonstrates the ability to make significant improvements in function in a reasonable and predictable amount of time.     Equipment Recommendations   Tub/shower seat     Recommendations for Other Services   PT consult     Precautions/Restrictions   Precautions Precautions: Fall Precaution/Restrictions Comments: watch O2 Restrictions Weight Bearing Restrictions Per Provider Order: No     Mobility Bed Mobility Overal bed mobility: Needs Assistance Bed Mobility: Supine to Sit     Supine to sit: Contact guard          Transfers Overall transfer level: Needs assistance Equipment used: None Transfers: Sit to/from Stand Sit to Stand: Contact guard assist            General transfer comment: holds onto IV pole and reaches out for external support with ambulation in room      Balance Overall balance assessment: Needs assistance Sitting-balance support: Feet supported Sitting balance-Leahy Scale: Good     Standing balance support: During functional activity Standing balance-Leahy Scale: Fair Standing balance comment: stating standing without AD, needs to hold countertop/IV pole during transfers                           ADL either performed or assessed with clinical judgement   ADL Overall ADL's : Needs assistance/impaired Eating/Feeding: Set up;Sitting   Grooming: Set up;Sitting   Upper Body Bathing: Minimal assistance;Sitting   Lower Body Bathing: Minimal assistance;Sitting/lateral leans;Sit to/from stand   Upper Body Dressing : Contact guard assist   Lower Body Dressing: Moderate assistance;Sitting/lateral leans   Toilet Transfer: Contact guard Designer, fashion/clothing- Clothing Manipulation and Hygiene: Contact guard assist       Functional mobility during ADLs: Contact guard assist       Vision   Vision Assessment?: No apparent visual deficits     Perception Perception: Not tested       Praxis Praxis: Not tested       Pertinent Vitals/Pain Pain Assessment Pain Assessment: No/denies pain     Extremity/Trunk Assessment Upper Extremity Assessment Upper Extremity Assessment: Generalized weakness   Lower Extremity Assessment Lower Extremity Assessment: Defer to PT evaluation   Cervical / Trunk Assessment Cervical / Trunk Assessment: Normal   Communication Communication Communication: No  apparent difficulties   Cognition Arousal: Alert Behavior During Therapy: WFL for tasks assessed/performed Cognition: No apparent impairments                               Following commands: Intact       Cueing  General Comments   Cueing Techniques: Verbal cues  pt on  2.5L O2, unable to obtain O2 reading throughout session, pt denies DOE/SOB   Exercises     Shoulder Instructions      Home Living Family/patient expects to be discharged to:: Private residence Living Arrangements: Alone Available Help at Discharge: Family;Available PRN/intermittently (plans for sister to possibly move in w/ her) Type of Home: Mobile home Home Access: Stairs to enter Entrance Stairs-Number of Steps: 2 Entrance Stairs-Rails: Right;Left Home Layout: One level     Bathroom Shower/Tub: Tub only;Walk-in shower   Bathroom Toilet: Standard     Home Equipment: Agricultural consultant (2 wheels)   Additional Comments: DME from late significant other; does not wear O2 at home      Prior Functioning/Environment Prior Level of Function : Independent/Modified Independent;Driving             Mobility Comments: pt denies use of AD or any recent falls ADLs Comments: ind with ADL/IADL    OT Problem List: Decreased strength;Decreased range of motion;Decreased activity tolerance;Impaired balance (sitting and/or standing);Decreased safety awareness;Cardiopulmonary status limiting activity   OT Treatment/Interventions: Self-care/ADL training;Therapeutic exercise;Energy conservation;DME and/or AE instruction;Therapeutic activities;Patient/family education;Balance training      OT Goals(Current goals can be found in the care plan section)   Acute Rehab OT Goals Patient Stated Goal: none stated OT Goal Formulation: With patient Time For Goal Achievement: 08/03/23 Potential to Achieve Goals: Good ADL Goals Pt Will Perform Upper Body Dressing: with modified independence;sitting Pt Will Perform Lower Body Dressing: with modified independence;sitting/lateral leans;sit to/from stand Pt Will Transfer to Toilet: with modified independence;ambulating;regular height toilet Pt Will Perform Tub/Shower Transfer: Shower transfer;with modified independence;ambulating Additional ADL Goal #1:  pt will tolerate OOB activity x10 min with SpO2 above 90% in prep fro ADLs   OT Frequency:  Min 2X/week    Co-evaluation              AM-PAC OT "6 Clicks" Daily Activity     Outcome Measure Help from another person eating meals?: A Little Help from another person taking care of personal grooming?: A Little Help from another person toileting, which includes using toliet, bedpan, or urinal?: A Little Help from another person bathing (including washing, rinsing, drying)?: A Little Help from another person to put on and taking off regular upper body clothing?: A Little Help from another person to put on and taking off regular lower body clothing?: A Lot 6 Click Score: 17   End of Session Equipment Utilized During Treatment: Gait belt;Oxygen Nurse Communication: Mobility status  Activity Tolerance: Patient tolerated treatment well Patient left: in chair;with call bell/phone within reach;with chair alarm set  OT Visit Diagnosis: Unsteadiness on feet (R26.81);Other abnormalities of gait and mobility (R26.89);Muscle weakness (generalized) (M62.81)                Time: 4098-1191 OT Time Calculation (min): 34 min Charges:  OT General Charges $OT Visit: 1 Visit OT Evaluation $OT Eval Moderate Complexity: 1 Mod OT Treatments $Self Care/Home Management : 8-22 mins  Pau Banh K, OTD, OTR/L SecureChat Preferred Acute Rehab [phone]   Antionette Kirks  07/20/2023, 9:25 AM

## 2023-07-20 NOTE — Hospital Course (Addendum)
 Diane Freeman is a 78 y.o.female with a history of Afib not on AC, CHF, hx brain bleeding, HTN, recent UGIB who was admitted to the [facility] Medicine Teaching Service at [facility] for septic shock iso Ecoli bacteremia and Staph aureus UTI. Her hospital course is detailed below:  Septic Shock Staph Aureus UTI Ecoli Bacteremia  Patient arrived hemodynamically unstable with BP 95/49 and tachypneic to 26. Initially admitted to ICU for vasopressors, then transitioned to PO midodrine  and transferred to floor status when stable. Initial UA concerning for UTI with urine culture demonstrating pan-sensitive staph aureus. Blood culture with largely pan-sensitive Ecoli. Patient initially started on broad spectrum abx, narrowed to CTX alone.   RLE DVT Thrombocytopenia  Acute R leg DVT on duplex. Started on heparin  gtt, transitioned to ***.  Vascular was consulted who reocmmended no acute intervention given asymptomatic, patient will follow with VSS outpatient.   Anemia  Was hospitalized for UGIB in March 2025, underwent EGD at that time which demonstrated LA Grade D reflux esophagitis, no bleeding, negative for H pylori. Patient started on PPI and sucralfate . Hgb remained stable throughout admission ***.   Afib w RVR Known Afib not on AC due to recent GI bleed. Entered afib with RVR during admission, thought to be secondary to infection. Recent echo (March 2025) with EF 60-65 and G1DD. Patient started on heparin  gtt during admission, transitioned to DOAC ***. Patient will follow up with Cardiology outpatient.   AKI Cr elevated to 1.87 on arrival, likely due to sepsis. Baseline around 0.8-1.1. Cr improved throughout admission with return to baseline prior to discharge ***  Other chronic conditions were medically managed with home medications and formulary alternatives as necessary (HLD, HTN)  PCP Follow-up Recommendations:

## 2023-07-20 NOTE — Progress Notes (Signed)
 PROGRESS NOTE    Diane Freeman  ION:[id number] DOB: [birth year] DOA: 07/17/2023 PCP: Sarahann Cumins, DO   Brief Narrative:  This patient is a 78 year old Caucasian female who was initially admitted and discharged on 06/11/2023 for upper GI bleed.  EGD showed reflux esophagitis with no bleeding and negative biopsy for H. pylori.  She presented to the hospital again 07/17/23 via EMS due to concern for generalized weakness for 1 day.  Her sister reports she went to check up on her because she has not picked up her phone attempts and found her altered and had vomited twice.   Patient was hemodynamically unstable with blood pressure of 95/49 and tachypneic 26, initiated on Levophed  10 and admitted to the ICU.  Further evaluation done showed that she was in septic shock in the setting of E. coli bacteremia and Staphylococcus aureus UTI.  She has been placed on IV ceftriaxone  and weaned off of pressors and was transitioned out of the ICU.  Of note she also had paroxysmal atrial fibrillation and has a right leg new DVT and has been placed on a heparin  drip.  PT OT recommending home health.  Platelet count continues to drop slowly will need to be continued monitored carefully as she is on anticoagulation with a heparin  drip.  Of note she was has an AKI which is slowly improving as her baseline is 0.8-1.1.  Anticipating discharge in the next 24 to 48 hours once she is transition to a DOAC and once platelet count is stable.  Assessment and Plan:  Septic Shock in the setting of Staphylococcus Aureus UTI and E Coli Bacteremia: Blood Cx showing that E Coli is Pan-sensitive except IV Cefazolin which is intermediate. Urinalysis done and showed a hazy appearance with moderate bilirubin, amber-colored urine, trace leukocytes, positive nitrites, rare bacteria, 6-10 RBCs per high-powered field, greater than 50 WBCs and urine culture showed Staphylococcus aureus which is pansensitive. Shock is resolved and Vasopressors  discontinued as MAP is >65.  He was placed on midodrine  5 mg p.o. every 8 hours scheduled we will continue today and start weaning and discontinue tomorrow.  WBC went from 33.0 -> 17.7 -> 8.8. LA went from 6.1 -> 3.1 -> 2.1. C/w IV Ceftriaxone  2 g for her bacteremia.  PT OT recommending home health.  Central venous catheter has been discontinued.  Acute New RLE DVT: LE Venous duplex done and showed Findings consistent with acute deep vein thrombosis involving the right  common femoral vein, right femoral vein, right popliteal vein, right  posterior tibial veins, and right peroneal veins. Findings consistent with acute intramuscular thrombosis involving the right gastrocnemius veins. On a Heparin  gtt as above and will transition to oral NOAC. D/w Dr. Edgardo Goodwill of Vascular who recommends no intervention at this time unless symptomatic and patient to be followed in the Vascular Clinic @ Discharge.    Hypokalemia: K+ is now 3.8. CTM and Trend and repeat Mag Level in the AM. Repeat CMP in the aM  AKI due to Sepsis, improving.  Creatinine baseline appears to be around 0.8-1.1.  BUN/Cr Trend: Recent Labs  Lab 07/17/23 1253 07/17/23 1259 07/17/23 1923 07/18/23 0454 07/19/23 0444 07/20/23 0709  BUN 13 13 12 12 14 17   CREATININE 1.87* 1.70* 1.58*  1.64* 1.37* 1.15* 1.27*  -Avoid Nephrotoxic Medications, Contrast Dyes, Hypotension and Dehydration to Ensure Adequate Renal Perfusion and will need to Renally Adjust Meds -Continue to Monitor and Trend Renal Function carefully and repeat CMP in the AM  Acute metabolic encephalopathy- septic Early dementia -C/w Delirium precautions -TOC consulted for possible alternative placement at discharge since she lives alone PT OT recommending home health.   Paroxysmal Atrial Fibrillation: Currently in NSR. Has been on Heparin  gtt since 4/17. Not currently requiring rate control meds.  Likely went into A-fib with RVR in the setting of her infection.  Recent  echocardiogram done a month ago showed an EF of 60 to 65% with grade 1 diastolic dysfunction. CHA2DS2-VASc was at least 4. Will discuss transition of Heparin  gtt to DOAC with patient in the AM and have Cardiology follow up in the outpatient setting  Hepatic Steatosis: Noted   History of Crohn's Disease: C/w Mesalamine  1500 mg po Daily; She will need OP GI follow up (sees [facility])   Generalized Weakness and Physical Deconditioning: PT/OT to Evaluate and Treat and recommending Home Health   Normocytic Anemia: Recent UGIB and was hospitalized last month and D/C'd 06/11/23. At that time EGD showed LA Grade D reflux esophagitis w/ no Bleeding and Negative Bx of H. Pylori but there was patchy mild erythematous mucosa in the gastric body. Hgb/Hct went from 10.2/31.2 -> 10.0/30.6 -> 10.2/32.3. Check Anemia Panel in the AM. CTM for S/Sx of Bleeding as she is anticoagulated with Heparin  gtt; No overt bleeding noted. C/w PPI w/ Pantoprazole  40 mg BID and Sucralfate  1 gram po AC/HS. Repeat CBC in the AM   Thrombocytopenia: In the setting Septic Shock and Bacteremia. Plt Count went from 133 -> 119 -> 101. CTM for S/Sx of Bleeding as she is on a Heparin  gtt; no overt bleeding noted. Repeat CBC in the AM and transition to DOAC if stable   Overweight: Complicates overall prognosis and care -Estimated body mass index is 27.42 kg/m as calculated from the following:   Height as of this encounter: 5\' 3"  (1.6 m).   Weight as of this encounter: 70.2 kg. Weight Loss and Dietary Counseling given   DVT prophylaxis: Place TED hose Start: 07/19/23 1151    Code Status: Full Code Family Communication: No family present at bedside  Disposition Plan:  Level of care: Med-Surg Status is: Inpatient Remains inpatient appropriate because: Needs further clinical improvement and PT/OT recommending HH at the time of D/C   Consultants:  PCCM Transfer D/w Dr. Edgardo Goodwill of Vascular  Procedures:  As delineated as  above  Antimicrobials:  Anti-infectives (From admission, onward)    Start     Dose/Rate Route Frequency Ordered Stop   07/19/23 1000  cefTRIAXone  (ROCEPHIN ) 2 g in sodium chloride  0.9 % 100 mL IVPB        2 g 200 mL/hr over 30 Minutes Intravenous Every 24 hours 07/18/23 1039 07/25/23 0959   07/18/23 2000  vancomycin  (VANCOCIN ) IVPB 1000 mg/200 mL premix  Status:  Discontinued        1,000 mg 200 mL/hr over 60 Minutes Intravenous Every 48 hours 07/17/23 1625 07/18/23 1039   07/18/23 1400  cefTRIAXone  (ROCEPHIN ) 1 g in sodium chloride  0.9 % 100 mL IVPB  Status:  Discontinued        1 g 200 mL/hr over 30 Minutes Intravenous Every 24 hours 07/17/23 1551 07/18/23 0233   07/18/23 1400  azithromycin  (ZITHROMAX ) 500 mg in sodium chloride  0.9 % 250 mL IVPB  Status:  Discontinued        500 mg 250 mL/hr over 60 Minutes Intravenous Every 24 hours 07/17/23 1551 07/18/23 1039   07/18/23 1000  cefTRIAXone  (ROCEPHIN ) 2 g in sodium chloride  0.9 %  100 mL IVPB  Status:  Discontinued        2 g 200 mL/hr over 30 Minutes Intravenous Every 24 hours 07/18/23 0233 07/18/23 1039   07/17/23 1615  Vancomycin  (VANCOCIN ) 1,250 mg in sodium chloride  0.9 % 250 mL IVPB        1,250 mg 166.7 mL/hr over 90 Minutes Intravenous  Once 07/17/23 1602 07/17/23 2258   07/17/23 1315  cefTRIAXone  (ROCEPHIN ) 1 g in sodium chloride  0.9 % 100 mL IVPB        1 g 200 mL/hr over 30 Minutes Intravenous  Once 07/17/23 1308 07/17/23 1405   07/17/23 1315  azithromycin  (ZITHROMAX ) 500 mg in sodium chloride  0.9 % 250 mL IVPB        500 mg 250 mL/hr over 60 Minutes Intravenous  Once 07/17/23 1308 07/17/23 1441       Subjective: Seen and examined at bedside and sitting in the chair feels okay.  No nausea or vomiting.  Denies any lightheadedness or dizziness.  Leg is swollen.  Feels okay.  No other issues or concerns.  Objective: Vitals:   07/19/23 2258 07/20/23 0601 07/20/23 0851 07/20/23 1213  BP: 115/76 (!) 140/78 115/60 113/65   Pulse: 75 73 80 67  Resp:  18 16 17   Temp:  (!) 97.3 F (36.3 C) 97.9 F (36.6 C) 97.8 F (36.6 C)  TempSrc:   Oral Oral  SpO2: 95% 97% 97% 99%  Weight:  70.2 kg    Height:        Intake/Output Summary (Last 24 hours) at 07/20/2023 1430 Last data filed at 07/20/2023 0900 Gross per 24 hour  Intake 398.34 ml  Output --  Net 398.34 ml   Filed Weights   07/18/23 1055 07/19/23 0500 07/20/23 0601  Weight: 63.3 kg 67.6 kg 70.2 kg   Examination: Physical Exam:  Constitutional: WN/WD overweight chronically ill-appearing Caucasian female no acute distress Respiratory: Diminished to auscultation bilaterally, no wheezing, rales, rhonchi or crackles. Normal respiratory effort and patient is not tachypenic. No accessory muscle use.  Unlabored breathing Cardiovascular: RRR, no murmurs / rubs / gallops. S1 and S2 auscultated.  Has some lower extremity edema worse on the right compared to left Abdomen: Soft, non-tender, non-distended. Bowel sounds positive.  GU: Deferred. Musculoskeletal: No clubbing / cyanosis of digits/nails. No joint deformity upper and lower extremities. Skin: No rashes, lesions, ulcers limited skin evaluation. No induration; Warm and dry.  Neurologic: CN 2-12 grossly intact with no focal deficits. Romberg sign and cerebellar reflexes not assessed.  Psychiatric: Normal judgment and insight. Alert and oriented x 3. Normal mood and appropriate affect.   Data Reviewed: I have personally reviewed following labs and imaging studies  CBC: Recent Labs  Lab 07/17/23 1253 07/17/23 1259 07/17/23 1517 07/17/23 1923 07/18/23 0454 07/19/23 0444 07/20/23 0709  WBC 14.4*  --   --  48.4* 33.0* 17.7* 8.8  NEUTROABS 13.4*  --   --   --   --   --   --   HGB 11.1*   < > 10.9* 10.5* 10.2* 10.0* 10.2*  HCT 34.9*   < > 32.0* 32.8* 31.2* 30.6* 32.3*  MCV 85.3  --   --  84.1 82.3 82.0 84.1  PLT 127*  --   --  220 133* 119* 101*   < > = values in this interval not displayed.   Basic  Metabolic Panel: Recent Labs  Lab 07/17/23 1253 07/17/23 1259 07/17/23 1517 07/17/23 1923 07/18/23 0454 07/19/23 0444 07/20/23  0709  NA 141 142 143 137 137 136 135  K 2.6* 2.6* 2.4* 2.8* 3.3* 3.4* 3.8  CL 107 107  --  106 107 106 106  CO2 16*  --   --  16* 17* 21* 22  GLUCOSE 73 69*  --  129* 117* 107* 100*  BUN 13 13  --  12 12 14 17   CREATININE 1.87* 1.70*  --  1.58*  1.64* 1.37* 1.15* 1.27*  CALCIUM  7.5*  --   --  7.6* 7.4* 7.2* 7.5*  MG 1.4*  --   --  1.9 1.9 2.2 2.2  PHOS  --   --   --  3.1 2.9  --   --    GFR: Estimated Creatinine Clearance: 33.7 mL/min (A) (by C-G formula based on SCr of 1.27 mg/dL (H)). Liver Function Tests: Recent Labs  Lab 07/17/23 1253  AST 294*  ALT 64*  ALKPHOS 117  BILITOT 3.3*  PROT 5.7*  ALBUMIN 1.8*   Recent Labs  Lab 07/17/23 1253  LIPASE 37   No results for input(s): "AMMONIA" in the last 168 hours. Coagulation Profile: Recent Labs  Lab 07/17/23 1334  INR 1.7*   Cardiac Enzymes: Recent Labs  Lab 07/17/23 1253  CKTOTAL 91   BNP (last 3 results) No results for input(s): "PROBNP" in the last 8760 hours. HbA1C: No results for input(s): "HGBA1C" in the last 72 hours. CBG: Recent Labs  Lab 07/18/23 2259 07/19/23 0334 07/19/23 0807 07/19/23 1144 07/19/23 1527  GLUCAP 105* 102* 91 99 98   Lipid Profile: No results for input(s): "CHOL", "HDL", "LDLCALC", "TRIG", "CHOLHDL", "LDLDIRECT" in the last 72 hours. Thyroid  Function Tests: Recent Labs    07/18/23 0454  TSH 0.477  FREET4 2.00*   Anemia Panel: No results for input(s): "VITAMINB12", "FOLATE", "FERRITIN", "TIBC", "IRON", "RETICCTPCT" in the last 72 hours. Sepsis Labs: Recent Labs  Lab 07/17/23 1923 07/17/23 2133 07/17/23 2352 07/18/23 1110 07/18/23 1603  PROCALCITON 23.51  --   --   --   --   LATICACIDVEN  --  6.3* 6.1* 3.1* 2.1*    Recent Results (from the past 240 hours)  Resp panel by RT-PCR (RSV, Flu A&B, Covid) Anterior Nasal Swab     Status:  None   Collection Time: 07/17/23 12:09 PM   Specimen: Anterior Nasal Swab  Result Value Ref Range Status   SARS Coronavirus 2 by RT PCR NEGATIVE NEGATIVE Final   Influenza A by PCR NEGATIVE NEGATIVE Final   Influenza B by PCR NEGATIVE NEGATIVE Final    Comment: (NOTE) The Xpert Xpress SARS-CoV-2/FLU/RSV plus assay is intended as an aid in the diagnosis of influenza from Nasopharyngeal swab specimens and should not be used as a sole basis for treatment. Nasal washings and aspirates are unacceptable for Xpert Xpress SARS-CoV-2/FLU/RSV testing.  Fact Sheet for Patients: BloggerCourse.com  Fact Sheet for Healthcare Providers: SeriousBroker.it  This test is not yet approved or cleared by the [place]  FDA and has been authorized for detection and/or diagnosis of SARS-CoV-2 by FDA under an Emergency Use Authorization (EUA). This EUA will remain in effect (meaning this test can be used) for the duration of the COVID-19 declaration under Section 564(b)(1) of the Act, 21 U.S.C. section 360bbb-3(b)(1), unless the authorization is terminated or revoked.     Resp Syncytial Virus by PCR NEGATIVE NEGATIVE Final    Comment: (NOTE) Fact Sheet for Patients: BloggerCourse.com  Fact Sheet for Healthcare Providers: SeriousBroker.it  This test is not yet approved  or cleared by the [place]  FDA and has been authorized for detection and/or diagnosis of SARS-CoV-2 by FDA under an Emergency Use Authorization (EUA). This EUA will remain in effect (meaning this test can be used) for the duration of the COVID-19 declaration under Section 564(b)(1) of the Act, 21 U.S.C. section 360bbb-3(b)(1), unless the authorization is terminated or revoked.  Performed at [facility], [address], [place], [place] [address]   Blood Culture (routine x 2)     Status: Abnormal   Collection Time:  07/17/23  1:34 PM   Specimen: BLOOD  Result Value Ref Range Status   Specimen Description BLOOD SITE NOT SPECIFIED  Final   Special Requests   Final    BOTTLES DRAWN AEROBIC AND ANAEROBIC Blood Culture results may not be optimal due to an inadequate volume of blood received in culture bottles   Culture  Setup Time   Final    GRAM NEGATIVE RODS IN BOTH AEROBIC AND ANAEROBIC BOTTLES CRITICAL RESULT CALLED TO, READ BACK BY AND VERIFIED WITH: PHARMD J LEDFORD 07/18/2023 @ 0148 BY AB Performed at [facility], [address] [place], [place] [address]    Culture ESCHERICHIA COLI (A)  Final   Report Status 07/19/2023 FINAL  Final   Organism ID, Bacteria ESCHERICHIA COLI  Final   Organism ID, Bacteria ESCHERICHIA COLI  Final      Susceptibility   Escherichia coli - KIRBY BAUER*    CEFAZOLIN INTERMEDIATE Intermediate    Escherichia coli - MIC*    AMPICILLIN  8 SENSITIVE Sensitive     CEFEPIME <=0.12 SENSITIVE Sensitive     CEFTAZIDIME <=1 SENSITIVE Sensitive     CEFTRIAXONE  <=0.25 SENSITIVE Sensitive     CIPROFLOXACIN  <=0.25 SENSITIVE Sensitive     GENTAMICIN <=1 SENSITIVE Sensitive     IMIPENEM <=0.25 SENSITIVE Sensitive     TRIMETH/SULFA <=20 SENSITIVE Sensitive     AMPICILLIN /SULBACTAM <=2 SENSITIVE Sensitive     PIP/TAZO <=4 SENSITIVE Sensitive ug/mL    * ESCHERICHIA COLI    ESCHERICHIA COLI  Blood Culture ID Panel (Reflexed)     Status: Abnormal   Collection Time: 07/17/23  1:34 PM  Result Value Ref Range Status   Enterococcus faecalis NOT DETECTED NOT DETECTED Final   Enterococcus Faecium NOT DETECTED NOT DETECTED Final   Listeria monocytogenes NOT DETECTED NOT DETECTED Final   Staphylococcus species NOT DETECTED NOT DETECTED Final   Staphylococcus aureus (BCID) NOT DETECTED NOT DETECTED Final   Staphylococcus epidermidis NOT DETECTED NOT DETECTED Final   Staphylococcus lugdunensis NOT DETECTED NOT DETECTED Final   Streptococcus species NOT DETECTED NOT DETECTED Final    Streptococcus agalactiae NOT DETECTED NOT DETECTED Final   Streptococcus pneumoniae NOT DETECTED NOT DETECTED Final   Streptococcus pyogenes NOT DETECTED NOT DETECTED Final   A.calcoaceticus-baumannii NOT DETECTED NOT DETECTED Final   Bacteroides fragilis NOT DETECTED NOT DETECTED Final   Enterobacterales DETECTED (A) NOT DETECTED Final    Comment: Enterobacterales represent a large order of gram negative bacteria, not a single organism. CRITICAL RESULT CALLED TO, READ BACK BY AND VERIFIED WITH: PHARMD J LEDFORD 07/18/2023 @ 0148 BY AB    Enterobacter cloacae complex NOT DETECTED NOT DETECTED Final   Escherichia coli DETECTED (A) NOT DETECTED Final    Comment: CRITICAL RESULT CALLED TO, READ BACK BY AND VERIFIED WITH: PHARMD J LEDFORD 07/18/2023 @ 0148 BY AB    Klebsiella aerogenes NOT DETECTED NOT DETECTED Final   Klebsiella oxytoca NOT DETECTED NOT  DETECTED Final   Klebsiella pneumoniae NOT DETECTED NOT DETECTED Final   Proteus species NOT DETECTED NOT DETECTED Final   Salmonella species NOT DETECTED NOT DETECTED Final   Serratia marcescens NOT DETECTED NOT DETECTED Final   Haemophilus influenzae NOT DETECTED NOT DETECTED Final   Neisseria meningitidis NOT DETECTED NOT DETECTED Final   Pseudomonas aeruginosa NOT DETECTED NOT DETECTED Final   Stenotrophomonas maltophilia NOT DETECTED NOT DETECTED Final   Candida albicans NOT DETECTED NOT DETECTED Final   Candida auris NOT DETECTED NOT DETECTED Final   Candida glabrata NOT DETECTED NOT DETECTED Final   Candida krusei NOT DETECTED NOT DETECTED Final   Candida parapsilosis NOT DETECTED NOT DETECTED Final   Candida tropicalis NOT DETECTED NOT DETECTED Final   Cryptococcus neoformans/gattii NOT DETECTED NOT DETECTED Final   CTX-M ESBL NOT DETECTED NOT DETECTED Final   Carbapenem resistance IMP NOT DETECTED NOT DETECTED Final   Carbapenem resistance KPC NOT DETECTED NOT DETECTED Final   Carbapenem resistance NDM NOT DETECTED NOT DETECTED  Final   Carbapenem resist OXA 48 LIKE NOT DETECTED NOT DETECTED Final   Carbapenem resistance VIM NOT DETECTED NOT DETECTED Final    Comment: Performed at [facility], [address], [place], [place] [address]  MRSA Next Gen by PCR, Nasal     Status: None   Collection Time: 07/17/23  3:48 PM   Specimen: Nasal Mucosa; Nasal Swab  Result Value Ref Range Status   MRSA by PCR Next Gen NOT DETECTED NOT DETECTED Final    Comment: (NOTE) The GeneXpert MRSA Assay (FDA approved for NASAL specimens only), is one component of a comprehensive MRSA colonization surveillance program. It is not intended to diagnose MRSA infection nor to guide or monitor treatment for MRSA infections. Test performance is not FDA approved in patients less than 52 years old. Performed at [facility], [address], [place], [place] [address]   Urine Culture     Status: Abnormal   Collection Time: 07/18/23  4:08 AM   Specimen: Urine, Random  Result Value Ref Range Status   Specimen Description URINE, RANDOM  Final   Special Requests   Final    NONE Reflexed from [id number] Performed at [facility], [address] [place], [place] [address]    Culture >=100,000 COLONIES/mL STAPHYLOCOCCUS AUREUS (A)  Final   Report Status 07/20/2023 FINAL  Final   Organism ID, Bacteria STAPHYLOCOCCUS AUREUS (A)  Final      Susceptibility   Staphylococcus aureus - MIC*    CIPROFLOXACIN  <=0.5 SENSITIVE Sensitive     GENTAMICIN <=0.5 SENSITIVE Sensitive     NITROFURANTOIN <=16 SENSITIVE Sensitive     OXACILLIN 0.5 SENSITIVE Sensitive     TETRACYCLINE <=1 SENSITIVE Sensitive     VANCOMYCIN  <=0.5 SENSITIVE Sensitive     TRIMETH/SULFA <=10 SENSITIVE Sensitive     RIFAMPIN <=0.5 SENSITIVE Sensitive     Inducible Clindamycin NEGATIVE Sensitive     LINEZOLID 4 SENSITIVE Sensitive     * >=100,000 COLONIES/mL STAPHYLOCOCCUS AUREUS    Radiology Studies: No results found.  Scheduled Meds:  Chlorhexidine  Gluconate  Cloth  6 each Topical Daily   feeding supplement  237 mL Oral BID BM   mesalamine   1,500 mg Oral Daily   midodrine   5 mg Oral Q8H   mupirocin  ointment  1 Application Nasal BID   nystatin    Topical TID   pantoprazole   40 mg Oral BID   sucralfate   1 g  Oral TID WC & HS   Continuous Infusions:  cefTRIAXone  (ROCEPHIN )  IV 2 g (07/20/23 0839)   heparin  1,500 Units/hr (07/20/23 0533)    LOS: 3 days   Aura Leeds, DO [facility] Available via Epic secure chat 7am-7pm After these hours, please refer to coverage provider listed on [email or web address] 07/20/2023, 2:30 PM

## 2023-07-20 NOTE — Evaluation (Signed)
 Physical Therapy Evaluation Patient Details Name: Diane Freeman MRN: [id number] DOB: 16-Jun-1945 Today's Date: 07/20/2023  History of Present Illness  Pt is a 78 y/o F presenting to ED on 4/16 with weakness, chills, prolonged heat exposure, admitted for septic shock. Found to have RLE DVT, started on heparin  4/17. PMH includes CHF, A fib RVR, chrohn's disease  Clinical Impression  Pt admitted with above diagnosis. Independent, driving, denies falls PTA. Lives alone but feels that she can arrange  24/7 supervision at d/c if needed. CGA for transfer and gait today. Safely uses RW for support, demonstrates impaired balance without UE supported.  O2 off when PT entered room, SpO2 95%. SpO2 93% with moderate dyspnea following ambulatory bout in room. BP 128/75. Denies dizziness. Will benefit from HHPT follow up. Pt currently with functional limitations due to the deficits listed below (see PT Problem List). Pt will benefit from acute skilled PT to increase their independence and safety with mobility to allow discharge.           If plan is discharge home, recommend the following: A little help with walking and/or transfers;A little help with bathing/dressing/bathroom;Assistance with cooking/housework;Assist for transportation;Help with stairs or ramp for entrance   Can travel by private vehicle        Equipment Recommendations None recommended by PT  Recommendations for Other Services       Functional Status Assessment Patient has had a recent decline in their functional status and demonstrates the ability to make significant improvements in function in a reasonable and predictable amount of time.     Precautions / Restrictions Precautions Precautions: Fall Precaution/Restrictions Comments: watch O2 Restrictions Weight Bearing Restrictions Per Provider Order: No      Mobility  Bed Mobility               General bed mobility comments: in recliner    Transfers Overall  transfer level: Needs assistance Equipment used: None, Rolling walker (2 wheels) Transfers: Sit to/from Stand Sit to Stand: Contact guard assist           General transfer comment: CGA for safety from recliner without AD, from toilet with RW. Cues for hand placement. Slower rise but stable with RW. Increased sway, reaching for furniture without Ad.    Ambulation/Gait Ambulation/Gait assistance: Supervision Gait Distance (Feet): 20 Feet (x2) Assistive device: Rolling walker (2 wheels) Gait Pattern/deviations: Step-through pattern, Decreased stance time - right Gait velocity: dec Gait velocity interpretation: <1.8 ft/sec, indicate of risk for recurrent falls   General Gait Details: gait a little guarded but stable with RW for support. No buckling or overt LOB noted. Educated on proper AD use with cues for proximity to device and remaining within RW during turns. Good control over all. 2/3 dyspnea, SpO2 93% on RA with questionable waveform.  Stairs            Wheelchair Mobility     Tilt Bed    Modified Rankin (Stroke Patients Only)       Balance Overall balance assessment: Needs assistance Sitting-balance support: Feet supported Sitting balance-Leahy Scale: Good     Standing balance support: During functional activity Standing balance-Leahy Scale: Fair Standing balance comment: Stands without UE support but shows increased sway, more stable with device.                             Pertinent Vitals/Pain Pain Assessment Pain Assessment: No/denies pain    Home Living  Family/patient expects to be discharged to:: Private residence Living Arrangements: Alone Available Help at Discharge: Family;Available PRN/intermittently (plans for sister to possibly move in w/ her) Type of Home: Mobile home Home Access: Stairs to enter Entrance Stairs-Rails: None Entrance Stairs-Number of Steps: 2   Home Layout: One level Home Equipment: Agricultural consultant (2  wheels);Grab bars - tub/shower Additional Comments: DME from late significant other; does not wear O2 at home    Prior Function Prior Level of Function : Independent/Modified Independent;Driving             Mobility Comments: pt denies use of AD or any recent falls ADLs Comments: ind with ADL/IADL     Extremity/Trunk Assessment   Upper Extremity Assessment Upper Extremity Assessment: Defer to OT evaluation    Lower Extremity Assessment Lower Extremity Assessment: Generalized weakness    Cervical / Trunk Assessment Cervical / Trunk Assessment: Normal  Communication   Communication Communication: No apparent difficulties    Cognition Arousal: Alert Behavior During Therapy: WFL for tasks assessed/performed   PT - Cognitive impairments: No apparent impairments                         Following commands: Intact       Cueing Cueing Techniques: Verbal cues     General Comments General comments (skin integrity, edema, etc.): O2 was off when PT entered room, SpO2 95%, HR 80. After ambulating SpO2 93%, moderate dyspnea but resolved with seated rest break. BP 128/75, denies dizziness. No drop in BP upon standing.    Exercises General Exercises - Lower Extremity Ankle Circles/Pumps: AROM, Both, 10 reps, Seated Quad Sets: Strengthening, Both, 10 reps, Seated Gluteal Sets: Strengthening, Both, 10 reps, Seated   Assessment/Plan    PT Assessment Patient needs continued PT services  PT Problem List Decreased strength;Decreased activity tolerance;Decreased mobility;Decreased balance;Decreased knowledge of use of DME;Cardiopulmonary status limiting activity       PT Treatment Interventions DME instruction;Gait training;Stair training;Functional mobility training;Therapeutic activities;Therapeutic exercise;Balance training;Neuromuscular re-education;Patient/family education    PT Goals (Current goals can be found in the Care Plan section)  Acute Rehab PT  Goals Patient Stated Goal: Get well go home PT Goal Formulation: With patient Time For Goal Achievement: 08/02/23 Potential to Achieve Goals: Good    Frequency Min 2X/week     Co-evaluation               AM-PAC PT "6 Clicks" Mobility  Outcome Measure Help needed turning from your back to your side while in a flat bed without using bedrails?: None Help needed moving from lying on your back to sitting on the side of a flat bed without using bedrails?: A Little Help needed moving to and from a bed to a chair (including a wheelchair)?: A Little Help needed standing up from a chair using your arms (e.g., wheelchair or bedside chair)?: A Little Help needed to walk in hospital room?: A Little Help needed climbing 3-5 steps with a railing? : A Little 6 Click Score: 19    End of Session Equipment Utilized During Treatment: Gait belt Activity Tolerance: Patient tolerated treatment well Patient left: in chair;with chair alarm set;with call bell/phone within reach Nurse Communication: Mobility status (VSS) PT Visit Diagnosis: Unsteadiness on feet (R26.81);Other abnormalities of gait and mobility (R26.89);Muscle weakness (generalized) (M62.81)    Time: 5621-3086 PT Time Calculation (min) (ACUTE ONLY): 23 min   Charges:   PT Evaluation $PT Eval Low Complexity: 1 Low PT Treatments $  Therapeutic Activity: 8-22 mins PT General Charges $$ ACUTE PT VISIT: 1 Visit         Jory Ng, PT, DPT [facility]  Rehabilitation Services Physical Therapist Office: [phone] Website: [email or web address]   Alinda Irani 07/20/2023, 10:05 AM

## 2023-07-20 NOTE — Hospital Course (Addendum)
 This patient is a 78 year old Caucasian female who was initially admitted and discharged on 06/11/2023 for upper GI bleed.  EGD showed reflux esophagitis with no bleeding and negative biopsy for H. pylori.  She presented to the hospital again 07/17/23 via EMS due to concern for generalized weakness for 1 day.  Her sister reports she went to check up on her because she has not picked up her phone attempts and found her altered and had vomited twice.   Patient was hemodynamically unstable with blood pressure of 95/49 and tachypneic 26, initiated on Levophed  10 and admitted to the ICU.  Further evaluation done showed that she was in septic shock in the setting of E. coli bacteremia and Staphylococcus aureus UTI.  She has been placed on IV ceftriaxone  and weaned off of pressors and was transitioned out of the ICU.  Of note she also had paroxysmal atrial fibrillation and has a right leg new DVT and has been placed on a heparin  drip.  PT OT recommending home health.  Platelet count continues to drop slowly will need to be continued monitored carefully as she is on anticoagulation with a heparin  drip.  Of note she was has an AKI which is slowly improving as her baseline is 0.8-1.1.  Anticipating discharge in the next 24 to 48 hours once she is transition to a DOAC and once platelet count is stable.  Assessment and Plan:  Septic Shock in the setting of Staphylococcus Aureus UTI and E Coli Bacteremia: Blood Cx showing that E Coli is Pan-sensitive except IV Cefazolin which is intermediate. Urinalysis done and showed a hazy appearance with moderate bilirubin, amber-colored urine, trace leukocytes, positive nitrites, rare bacteria, 6-10 RBCs per high-powered field, greater than 50 WBCs and urine culture showed Staphylococcus aureus which is pansensitive. Shock is resolved and Vasopressors discontinued as MAP is >65.  He was placed on midodrine  5 mg p.o. every 8 hours scheduled we will continue today and start weaning and  discontinue tomorrow.  WBC went from 33.0 -> 17.7 -> 8.8. LA went from 6.1 -> 3.1 -> 2.1. C/w IV Ceftriaxone  2 g for her bacteremia.  PT OT recommending home health.  Central venous catheter has been discontinued.  Acute New RLE DVT: LE Venous duplex done and showed Findings consistent with acute deep vein thrombosis involving the right  common femoral vein, right femoral vein, right popliteal vein, right  posterior tibial veins, and right peroneal veins. Findings consistent with acute intramuscular thrombosis involving the right gastrocnemius veins. On a Heparin  gtt as above and will transition to oral NOAC. D/w Dr. Edgardo Goodwill of Vascular who recommends no intervention at this time unless symptomatic and patient to be followed in the Vascular Clinic @ Discharge.    Hypokalemia: K+ is now 3.8. CTM and Trend and repeat Mag Level in the AM. Repeat CMP in the aM  AKI due to Sepsis, improving.  Creatinine baseline appears to be around 0.8-1.1.  BUN/Cr Trend: Recent Labs  Lab 07/17/23 1253 07/17/23 1259 07/17/23 1923 07/18/23 0454 07/19/23 0444 07/20/23 0709  BUN 13 13 12 12 14 17   CREATININE 1.87* 1.70* 1.58*  1.64* 1.37* 1.15* 1.27*  -Avoid Nephrotoxic Medications, Contrast Dyes, Hypotension and Dehydration to Ensure Adequate Renal Perfusion and will need to Renally Adjust Meds -Continue to Monitor and Trend Renal Function carefully and repeat CMP in the AM   Acute metabolic encephalopathy- septic Early dementia -C/w Delirium precautions -TOC consulted for possible alternative placement at discharge since she lives alone PT OT  recommending home health.   Paroxysmal Atrial Fibrillation: Currently in NSR. Has been on Heparin  gtt since 4/17. Not currently requiring rate control meds.  Likely went into A-fib with RVR in the setting of her infection.  Recent echocardiogram done a month ago showed an EF of 60 to 65% with grade 1 diastolic dysfunction.  Will consider repeating limited echocardiogram.   CHA2DS2-VASc was at least 4. Will discuss transition of Heparin  gtt to DOAC with patient in the AM and have Cardiology follow up in the outpatient setting  Hepatic Steatosis: Noted   History of Crohn's Disease: C/w Mesalamine  1500 mg po Daily; She will need OP GI follow up (sees [facility])   Generalized Weakness and Physical Deconditioning: PT/OT to Evaluate and Treat and recommending Home Health   Normocytic Anemia: Recent UGIB and was hospitalized last month and D/C'd 06/11/23. At that time EGD showed LA Grade D reflux esophagitis w/ no Bleeding and Negative Bx of H. Pylori but there was patchy mild erythematous mucosa in the gastric body. Hgb/Hct went from 10.2/31.2 -> 10.0/30.6 -> 10.2/32.3. Check Anemia Panel in the AM. CTM for S/Sx of Bleeding as she is anticoagulated with Heparin  gtt; No overt bleeding noted. C/w PPI w/ Pantoprazole  40 mg BID and Sucralfate  1 gram po AC/HS. Repeat CBC in the AM   Thrombocytopenia: In the setting Septic Shock and Bacteremia. Plt Count went from 133 -> 119 -> 101. CTM for S/Sx of Bleeding as she is on a Heparin  gtt; no overt bleeding noted. Repeat CBC in the AM and transition to DOAC if stable   Overweight: Complicates overall prognosis and care -Estimated body mass index is 27.42 kg/m as calculated from the following:   Height as of this encounter: 5\' 3"  (1.6 m).   Weight as of this encounter: 70.2 kg. Weight Loss and Dietary Counseling given

## 2023-07-21 ENCOUNTER — Encounter (HOSPITAL_COMMUNITY): Payer: Self-pay | Admitting: Pulmonary Disease

## 2023-07-21 ENCOUNTER — Other Ambulatory Visit: Payer: Self-pay | Admitting: Gastroenterology

## 2023-07-21 DIAGNOSIS — A4151 Sepsis due to Escherichia coli [E. coli]: Secondary | ICD-10-CM | POA: Diagnosis not present

## 2023-07-21 DIAGNOSIS — F0390 Unspecified dementia without behavioral disturbance: Secondary | ICD-10-CM

## 2023-07-21 DIAGNOSIS — A4901 Methicillin susceptible Staphylococcus aureus infection, unspecified site: Secondary | ICD-10-CM | POA: Diagnosis not present

## 2023-07-21 DIAGNOSIS — R6521 Severe sepsis with septic shock: Secondary | ICD-10-CM | POA: Diagnosis present

## 2023-07-21 DIAGNOSIS — Z789 Other specified health status: Secondary | ICD-10-CM

## 2023-07-21 DIAGNOSIS — G9341 Metabolic encephalopathy: Secondary | ICD-10-CM

## 2023-07-21 DIAGNOSIS — D649 Anemia, unspecified: Secondary | ICD-10-CM | POA: Diagnosis present

## 2023-07-21 DIAGNOSIS — I82419 Acute embolism and thrombosis of unspecified femoral vein: Secondary | ICD-10-CM | POA: Diagnosis present

## 2023-07-21 DIAGNOSIS — I82411 Acute embolism and thrombosis of right femoral vein: Secondary | ICD-10-CM | POA: Diagnosis not present

## 2023-07-21 DIAGNOSIS — E440 Moderate protein-calorie malnutrition: Secondary | ICD-10-CM | POA: Diagnosis present

## 2023-07-21 HISTORY — DX: Unspecified dementia, unspecified severity, without behavioral disturbance, psychotic disturbance, mood disturbance, and anxiety: F03.90

## 2023-07-21 LAB — COMPREHENSIVE METABOLIC PANEL WITH GFR
ALT: 36 U/L (ref 0–44)
AST: 70 U/L — ABNORMAL HIGH (ref 15–41)
Albumin: 2.1 g/dL — ABNORMAL LOW (ref 3.5–5.0)
Alkaline Phosphatase: 137 U/L — ABNORMAL HIGH (ref 38–126)
Anion gap: 10 (ref 5–15)
BUN: 16 mg/dL (ref 8–23)
CO2: 22 mmol/L (ref 22–32)
Calcium: 7.6 mg/dL — ABNORMAL LOW (ref 8.9–10.3)
Chloride: 103 mmol/L (ref 98–111)
Creatinine, Ser: 1.21 mg/dL — ABNORMAL HIGH (ref 0.44–1.00)
GFR, Estimated: 46 mL/min — ABNORMAL LOW (ref >60)
Glucose, Bld: 110 mg/dL — ABNORMAL HIGH (ref 70–99)
Potassium: 3.2 mmol/L — ABNORMAL LOW (ref 3.5–5.1)
Sodium: 135 mmol/L (ref 135–145)
Total Bilirubin: 0.9 mg/dL (ref 0.0–1.2)
Total Protein: 6.2 g/dL — ABNORMAL LOW (ref 6.5–8.1)

## 2023-07-21 LAB — IRON AND TIBC
Iron: 78 ug/dL (ref 28–170)
Saturation Ratios: 24 % (ref 10.4–31.8)
TIBC: 329 ug/dL (ref 250–450)
UIBC: 251 ug/dL

## 2023-07-21 LAB — BASIC METABOLIC PANEL WITH GFR
Anion gap: 8 (ref 5–15)
BUN: 17 mg/dL (ref 8–23)
CO2: 22 mmol/L (ref 22–32)
Calcium: 7.8 mg/dL — ABNORMAL LOW (ref 8.9–10.3)
Chloride: 105 mmol/L (ref 98–111)
Creatinine, Ser: 1.27 mg/dL — ABNORMAL HIGH (ref 0.44–1.00)
GFR, Estimated: 43 mL/min — ABNORMAL LOW (ref >60)
Glucose, Bld: 112 mg/dL — ABNORMAL HIGH (ref 70–99)
Potassium: 3.3 mmol/L — ABNORMAL LOW (ref 3.5–5.1)
Sodium: 135 mmol/L (ref 135–145)

## 2023-07-21 LAB — RETICULOCYTES
Immature Retic Fract: 11.2 % (ref 2.3–15.9)
RBC.: 4.04 MIL/uL (ref 3.87–5.11)
Retic Count, Absolute: 42.4 10*3/uL (ref 19.0–186.0)
Retic Ct Pct: 1.1 % (ref 0.4–3.1)

## 2023-07-21 LAB — VITAMIN B12: Vitamin B-12: 516 pg/mL (ref 180–914)

## 2023-07-21 LAB — FOLATE: Folate: 6.1 ng/mL (ref >5.9)

## 2023-07-21 LAB — FERRITIN: Ferritin: 69 ng/mL (ref 11–307)

## 2023-07-21 MED ORDER — APIXABAN 5 MG PO TABS
10.0000 mg | ORAL_TABLET | Freq: Two times a day (BID) | ORAL | Status: DC
  Administered 2023-07-21 – 2023-07-24 (×7): 10 mg via ORAL
  Filled 2023-07-21 (×7): qty 2

## 2023-07-21 MED ORDER — APIXABAN 5 MG PO TABS: 5.0000 mg | ORAL_TABLET | Freq: Two times a day (BID) | ORAL | Status: DC

## 2023-07-21 MED ORDER — POTASSIUM PHOSPHATES 15 MMOLE/5ML IV SOLN
45.0000 mmol | Freq: Once | INTRAVENOUS | Status: AC
  Administered 2023-07-21: 45 mmol via INTRAVENOUS
  Filled 2023-07-21: qty 15

## 2023-07-21 NOTE — Assessment & Plan Note (Signed)
 Likely inflammatory anemia secondary to Crohn's disease.  Given recent upper GI bleed, continue to trend CBC. - Follow-up anemia panel

## 2023-07-21 NOTE — Assessment & Plan Note (Signed)
 Crohn's disease: Continue mesalamine , Carafate 

## 2023-07-21 NOTE — Progress Notes (Signed)
     Daily Progress Note Intern Pager: [phone]  Patient name: Diane Freeman Medical record number: [id number] Date of birth: May 16, 1945 Age: 78 y.o. Gender: female  Primary Care Provider: Sarahann Cumins, DO Consultants: None Code Status: Full  Pt Overview and Major Events to Date:  -Admitted 04/16 to ICU  Assessment and Plan:  Diane Freeman is .78 y.o. female admitted for septic shock secondary to E. Coli bactermia. Her hospital stay was further complicated by acute right leg DVT. Pertinent PMH/PSH includes hx of upper GI bleed, HFpEF, PAF, Crohn's disease, dementia, HTN.  Assessment & Plan Septic shock (HCC), resolved  E. coli Bacteremia Vital signs stable, afebrile. - Continue IV ceftriaxone , expect oral transition 04/21 - DC midodrine  - Monitor Bps, fever curve - Daily CBC, BMP AKI (acute kidney injury) (HCC) Likely prerenal in the setting of shock.  Creatinine trend improving.  Creatinine today pending. -Trend BMP - Replete electrolytes as indicated Paroxysmal atrial fibrillation with RVR (HCC) Heart rate controlled. CHAD-VASC 3. Per last cardiology, no current plan for anticoagulation secondary to previous GI bleed, but was planning for implantable loop recorder. - Continue heparin  as above - Currently on no rate control agents Dementia (HCC)  Acute encephalopathy, resolved. Mental status today, stable. -Delirium precautions Normocytic anemia Likely inflammatory anemia secondary to Crohn's disease.  Given recent upper GI bleed, continue to trend CBC. - Follow-up anemia panel Acute DVT (deep venous thrombosis) (HCC) On heparin  gtt. -Transition to Eliquis   10mg  BID today for 6 more days then 5mg  BID. Chronic health problem Crohn's disease: Continue mesalamine , Carafate    FEN/GI: Regular PPx: Heparin  gtt Dispo: Pending clinical improvement  Subjective:  No acute events overnight.  Patient states she is doing mildly better.  Denies any dysuria or  frequency.  No abdominal pain.  Objective: Temp:  [97.6 F (36.4 C)-97.9 F (36.6 C)] 97.9 F (36.6 C) (04/20 0512) Pulse Rate:  [67-80] 73 (04/20 0512) Resp:  [16-20] 20 (04/20 0512) BP: (113-126)/(60-86) 126/63 (04/20 0512) SpO2:  [95 %-99 %] 97 % (04/20 0512) Weight:  [69.7 kg] 69.7 kg (04/20 0600) Physical Exam: General: NAD, lying comfortably in hospital bed Neuro: A&O Cardiovascular: RRR, no murmurs, no peripheral edema Respiratory: normal WOB on RA, CTAB, no wheezes, ronchi or rales Abdomen: soft, NTTP, no rebound or guarding Extremities: Moving all 4 extremities equally   Laboratory: Most recent CBC Lab Results  Component Value Date   WBC 8.8 07/20/2023   HGB 10.2 (L) 07/20/2023   HCT 32.3 (L) 07/20/2023   MCV 84.1 07/20/2023   PLT 101 (L) 07/20/2023   Most recent BMP    Latest Ref Rng & Units 07/20/2023    7:09 AM  BMP  Glucose 70 - 99 mg/dL 147   BUN 8 - 23 mg/dL 17   Creatinine 8.29 - 1.00 mg/dL 5.62   Sodium 130 - 865 mmol/L 135   Potassium 3.5 - 5.1 mmol/L 3.8   Chloride 98 - 111 mmol/L 106   CO2 22 - 32 mmol/L 22   Calcium  8.9 - 10.3 mg/dL 7.5     Other pertinent labs pending   Imaging/Diagnostic Tests: No new imaging.  Ivin Marrow, MD 07/21/2023, 7:24 AM  PGY-2, [facility] FPTS Intern pager: [phone], text pages welcome Secure chat group [facility] Teaching Service   Some or all of this note was dictated use Dragon software, there may be unintentional errors present.

## 2023-07-21 NOTE — Assessment & Plan Note (Addendum)
 Mental status today, stable. -Delirium precautions

## 2023-07-21 NOTE — Assessment & Plan Note (Addendum)
 Likely prerenal in the setting of shock.  Creatinine trend improving.  Creatinine today pending. -Trend BMP - Replete electrolytes as indicated

## 2023-07-21 NOTE — Assessment & Plan Note (Addendum)
 Vital signs stable, afebrile. - Continue IV ceftriaxone , expect oral transition 04/21 - DC midodrine  - Monitor Bps, fever curve - Daily CBC, BMP

## 2023-07-21 NOTE — Assessment & Plan Note (Addendum)
 On heparin  gtt. -Transition to Eliquis   10mg  BID today for 6 more days then 5mg  BID.

## 2023-07-21 NOTE — Discharge Instructions (Signed)
 Information on my medicine - ELIQUIS  (apixaban )  This medication education was reviewed with me or my healthcare representative as part of my discharge preparation.  Why was Eliquis  prescribed for you? Eliquis  was prescribed to treat blood clots that may have been found in the veins of your legs (deep vein thrombosis) or in your lungs (pulmonary embolism) and to reduce the risk of them occurring again.  What do You need to know about Eliquis  ? The starting dose is 10 mg (two 5 mg tablets) taken TWICE daily for the FIRST SIX (6) DAYS, then on 4/26  the dose is reduced to ONE 5 mg tablet taken TWICE daily.  Eliquis  may be taken with or without food.   Try to take the dose about the same time in the morning and in the evening. If you have difficulty swallowing the tablet whole please discuss with your pharmacist how to take the medication safely.  Take Eliquis  exactly as prescribed and DO NOT stop taking Eliquis  without talking to the doctor who prescribed the medication.  Stopping may increase your risk of developing a new blood clot.  Refill your prescription before you run out.  After discharge, you should have regular check-up appointments with your healthcare provider that is prescribing your Eliquis .    What do you do if you miss a dose? If a dose of ELIQUIS  is not taken at the scheduled time, take it as soon as possible on the same day and twice-daily administration should be resumed. The dose should not be doubled to make up for a missed dose.  Important Safety Information A possible side effect of Eliquis  is bleeding. You should call your healthcare provider right away if you experience any of the following: Bleeding from an injury or your nose that does not stop. Unusual colored urine (red or dark brown) or unusual colored stools (red or black). Unusual bruising for unknown reasons. A serious fall or if you hit your head (even if there is no bleeding).  Some medicines may  interact with Eliquis  and might increase your risk of bleeding or clotting while on Eliquis . To help avoid this, consult your healthcare provider or pharmacist prior to using any new prescription or non-prescription medications, including herbals, vitamins, non-steroidal anti-inflammatory drugs (NSAIDs) and supplements.  This website has more information on Eliquis  (apixaban ): [email or web address] .com/eliquis Romaine Closs

## 2023-07-21 NOTE — Plan of Care (Signed)
  Problem: Education: Goal: Knowledge of General Education information will improve Description: Including pain rating scale, medication(s)/side effects and non-pharmacologic comfort measures Outcome: Progressing   Problem: Health Behavior/Discharge Planning: Goal: Ability to manage health-related needs will improve Outcome: Progressing   Problem: Clinical Measurements: Goal: Ability to maintain clinical measurements within normal limits will improve Outcome: Progressing   Problem: Clinical Measurements: Goal: Will remain free from infection Outcome: Progressing   Problem: Clinical Measurements: Goal: Diagnostic test results will improve Outcome: Progressing   Problem: Clinical Measurements: Goal: Respiratory complications will improve Outcome: Progressing   Problem: Clinical Measurements: Goal: Cardiovascular complication will be avoided Outcome: Progressing   Problem: Skin Integrity: Goal: Risk for impaired skin integrity will decrease Outcome: Progressing   Problem: Safety: Goal: Ability to remain free from injury will improve Outcome: Progressing   Problem: Pain Managment: Goal: General experience of comfort will improve and/or be controlled Outcome: Progressing

## 2023-07-21 NOTE — Assessment & Plan Note (Addendum)
 Heart rate controlled. CHAD-VASC 3. Per last cardiology, no current plan for anticoagulation secondary to previous GI bleed, but was planning for implantable loop recorder. - Continue heparin  as above - Currently on no rate control agents

## 2023-07-21 NOTE — Progress Notes (Signed)
 RNCM provided private duty information to patient's sister for in home care as requested.   Discussed PCS with patient's sister also.  Patient's sister hopeful that patient can go to a SNF at discharge.

## 2023-07-22 DIAGNOSIS — A4151 Sepsis due to Escherichia coli [E. coli]: Secondary | ICD-10-CM | POA: Diagnosis not present

## 2023-07-22 DIAGNOSIS — R6521 Severe sepsis with septic shock: Secondary | ICD-10-CM | POA: Diagnosis not present

## 2023-07-22 DIAGNOSIS — I82411 Acute embolism and thrombosis of right femoral vein: Secondary | ICD-10-CM | POA: Diagnosis not present

## 2023-07-22 DIAGNOSIS — A4901 Methicillin susceptible Staphylococcus aureus infection, unspecified site: Secondary | ICD-10-CM | POA: Diagnosis not present

## 2023-07-22 LAB — BASIC METABOLIC PANEL WITH GFR
Anion gap: 6 (ref 5–15)
BUN: 14 mg/dL (ref 8–23)
CO2: 22 mmol/L (ref 22–32)
Calcium: 7.5 mg/dL — ABNORMAL LOW (ref 8.9–10.3)
Chloride: 107 mmol/L (ref 98–111)
Creatinine, Ser: 1.17 mg/dL — ABNORMAL HIGH (ref 0.44–1.00)
GFR, Estimated: 47 mL/min — ABNORMAL LOW (ref >60)
Glucose, Bld: 127 mg/dL — ABNORMAL HIGH (ref 70–99)
Potassium: 3.5 mmol/L (ref 3.5–5.1)
Sodium: 135 mmol/L (ref 135–145)

## 2023-07-22 LAB — CBC
HCT: 31.5 % — ABNORMAL LOW (ref 36.0–46.0)
Hemoglobin: 10.1 g/dL — ABNORMAL LOW (ref 12.0–15.0)
MCH: 26.9 pg (ref 26.0–34.0)
MCHC: 32.1 g/dL (ref 30.0–36.0)
MCV: 84 fL (ref 80.0–100.0)
Platelets: 75 10*3/uL — ABNORMAL LOW (ref 150–400)
RBC: 3.75 MIL/uL — ABNORMAL LOW (ref 3.87–5.11)
RDW: 16.5 % — ABNORMAL HIGH (ref 11.5–15.5)
WBC: 5.3 10*3/uL (ref 4.0–10.5)
nRBC: 0 % (ref 0.0–0.2)

## 2023-07-22 LAB — PHOSPHORUS
Phosphorus: <1 mg/dL — CL (ref 2.5–4.6)
Phosphorus: 2.7 mg/dL (ref 2.5–4.6)

## 2023-07-22 LAB — MAGNESIUM: Magnesium: 1.8 mg/dL (ref 1.7–2.4)

## 2023-07-22 NOTE — Assessment & Plan Note (Addendum)
 Vital signs stable, remains afebrile. - Continue IV ceftriaxone  - Monitor BPs, fever curve - Daily CBC, BMP

## 2023-07-22 NOTE — Progress Notes (Addendum)
 Physical Therapy Treatment Patient Details Name: Diane Freeman MRN: [id number] DOB: [birth year] Today's Date: 07/22/2023   History of Present Illness Pt is a 78 y/o F presenting to ED on 4/16 with weakness, chills, prolonged heat exposure, admitted for septic shock. Found to have RLE DVT, started on heparin  4/17. PT with symptomatic orthostatic hypotension during PT session 07/22/23. PMH includes CHF, A fib RVR, chrohn's disease, cognitive impairment.    PT Comments  PT received in supine after eating a few bites of her food, pt sister Russ Course present, pt agreeable to therapy session. Pt demonstrates good effort for transfer and gait training in her room but limited due to fatigue and symptomatic orthostatic hypotension this session. Pt needing up to modA boost to stand from lower chair surface heights and from toilet seat height without use of wall rail (pt reports no wall rail at home) and needing minA to perform bed mobility from flat bed without rails. Pt unable to perform gait trial more than 84ft prior to needing to sit abruptly and pt given a chair follow for safety during session due to symptoms. Pt and sister report today she cannot currently arrange constant supervision/physical assist for home, and are agreeable to additional post-acute therapy after DC prior to return home, as she is not able to care for herself at current level of function and lives alone. Patient will benefit from continued inpatient follow up therapy, <3 hours/day. Vital Signs  Patient Position (if appropriate) Orthostatic Vitals  Orthostatic Sitting  BP- Sitting 129/58  Pulse- Sitting 88  Orthostatic Standing at 0 minutes  BP- Standing at 0 minutes 103/67  Pulse- Standing at 0 minutes 91     If plan is discharge home, recommend the following: A little help with bathing/dressing/bathroom;Assistance with cooking/housework;Assist for transportation;Help with stairs or ramp for entrance;A lot of help with walking and/or  transfers (modA to stand from toilet)   Can travel by private vehicle     Yes (may not be able to get into higher SUV type car)  Equipment Recommendations  BSC/3in1;Rolling walker (2 wheels) (recommend BSC or wall rails installed)    Recommendations for Other Services       Precautions / Restrictions Precautions Precautions: Fall;Other (comment) Recall of Precautions/Restrictions: Impaired Precaution/Restrictions Comments: symptomatic orthostatic hypotension Required Braces or Orthoses:  (pt has compression socks in her room, plan to don next session now they they have been found) Restrictions Weight Bearing Restrictions Per Provider Order: No     Mobility  Bed Mobility Overal bed mobility: Needs Assistance Bed Mobility: Supine to Sit     Supine to sit: Min assist     General bed mobility comments: from flat bed without using rails, pt attempted to perform unassisted but falling back toward her L side, needing minA trunk support to recover and then assist with bed pad to advance hips once she rotated her legs over to edge of bed. Pt reports BLE feeling heavier than they typically do and per sister she has increased BLE edema from her baseline.    Transfers Overall transfer level: Needs assistance Equipment used: None, Rolling walker (2 wheels) Transfers: Sit to/from Stand Sit to Stand: Min assist, Mod assist           General transfer comment: From chair and toilet seat without using arm rests, pt needing modA boost to stand, pt attempted with CGA or minA but was unable to achieve upright without heavier lift assist. Pt stand from elevated bed height to RW  with mod safety cues and CGA, minA for stand>sit to recliner at end of session with decreased eccentric control to sit.    Ambulation/Gait Ambulation/Gait assistance: Min assist, Contact guard assist, +2 for safety returning from bathroom Gait Distance (Feet): 20 Feet (25 ft to bathroom, then 37ft, then  28ft) Assistive device: Rolling walker (2 wheels) Gait Pattern/deviations: Step-through pattern, Decreased stance time - right, Trunk flexed Gait velocity: dec     General Gait Details: Pt having difficulty maintaining close proximity to RW. No buckling or overt LOB noted, small shuffled steps. Pt with 2/3 dyspnea, SpO2 93% and above on RA with questionable waveform while holding RW but WFL once pt letting go of RW handle with that hand.   Stairs Stairs:  (pt too fatigued and orthostatic hypotension while standing today so defer)           Wheelchair Mobility     Tilt Bed    Modified Rankin (Stroke Patients Only)       Balance Overall balance assessment: Needs assistance Sitting-balance support: Feet supported Sitting balance-Leahy Scale: Good     Standing balance support: During functional activity Standing balance-Leahy Scale: Poor Standing balance comment: very reliant on RW, can stand with single UE on RW support but needs BUE support for dynamic standing tasks                            Communication Communication Communication: No apparent difficulties  Cognition Arousal: Alert Behavior During Therapy: WFL for tasks assessed/performed   PT - Cognitive impairments: No apparent impairments, Memory, Sequencing, Problem solving, Safety/Judgement                       PT - Cognition Comments: Pt needs multimodal cues to perform transfers from lower surface height without arm rests (toilet/chair). Difficulty recalling safe UE placement, pt does not always use RW at baseline. Following commands: Intact      Cueing Cueing Techniques: Verbal cues  Exercises      General Comments General comments (skin integrity, edema, etc.): BP 129/58 (79) HR 88 bpm sitting in chair; BP 103/67 (77) HR 91 bpm standing, lightheaded/fatigued      Pertinent Vitals/Pain Pain Assessment Pain Assessment: No/denies pain    Home Living                           Prior Function            PT Goals (current goals can now be found in the care plan section) Acute Rehab PT Goals Patient Stated Goal: To get stronger and be able to stand and walk unassisted so I can go home per pt/sister. PT Goal Formulation: With patient Time For Goal Achievement: 08/02/23 Progress towards PT goals: Progressing toward goals    Frequency    Min 2X/week      PT Plan      Co-evaluation              AM-PAC PT "6 Clicks" Mobility   Outcome Measure  Help needed turning from your back to your side while in a flat bed without using bedrails?: None Help needed moving from lying on your back to sitting on the side of a flat bed without using bedrails?: A Little Help needed moving to and from a bed to a chair (including a wheelchair)?: A Little Help needed standing up from a chair  using your arms (e.g., wheelchair or bedside chair)?: A Lot Help needed to walk in hospital room?: A Lot (recommend chair follow) Help needed climbing 3-5 steps with a railing? : Total 6 Click Score: 15    End of Session Equipment Utilized During Treatment: Gait belt Activity Tolerance: Patient limited by fatigue;Treatment limited secondary to medical complications (Comment);Other (comment) (symptomatic drop in BP with standing today) Patient left: in chair;with chair alarm set;with call bell/phone within reach;with family/visitor present;Other (comment) (sister Patty present in room and encouraging her) Nurse Communication: Mobility status;Other (comment) (drop in BP with standing) PT Visit Diagnosis: Unsteadiness on feet (R26.81);Other abnormalities of gait and mobility (R26.89);Muscle weakness (generalized) (M62.81)     Time: 1231-1310 PT Time Calculation (min) (ACUTE ONLY): 39 min  Charges:    $Gait Training: 23-37 mins $Therapeutic Activity: 8-22 mins PT General Charges $$ ACUTE PT VISIT: 1 Visit                     Kemonte Ullman P., PTA Acute Rehabilitation  Services Secure Chat Preferred 9a-5:30pm Office: [phone]    [facility] 07/22/2023, 1:42 PM

## 2023-07-22 NOTE — Progress Notes (Signed)
 Mobility Specialist: Progress Note   07/22/23 1131  Mobility  Activity Transferred to/from [facility]  Level of Assistance Contact guard assist, steadying assist  Assistive Device Front wheel walker  Activity Response Tolerated poorly  Mobility Referral Yes  Mobility visit 1 Mobility  Mobility Specialist Start Time (ACUTE ONLY) 0957  Mobility Specialist Stop Time (ACUTE ONLY) 1015  Mobility Specialist Time Calculation (min) (ACUTE ONLY) 18 min    Pre Mobility: SpO2 97-98% RA  Pt was agreeable to mobility session - received in bed. Removed from 1LO2 [place] - SpO2 97-98% RA. MinA for bed mobility to assist with trunk elevation and scooting hips. Feeling SOB and dizzy at EOB but MS is unable to get a pulse ox so 1LO2 via [place] is replaced on pt. CG for STS. Had a BM in bed. Pt was able to static stand with SV while MS assisted with pericare. Then requested to use BSC, CG for stand pivot - successful BM and void. MS assisted with pericare. Declined further ambulation after that d/t fatigue and dizziness. Returned to bed. MinA to assist BLEs back on bed. Still unable to get a pulse ox reading. Left in supine on 1LO2 with all needs met, call bell in reach.   Deloria Fetch Mobility Specialist Please contact via SecureChat or Rehab office at [phone]

## 2023-07-22 NOTE — Assessment & Plan Note (Addendum)
 Likely prerenal in the setting of shock.  Creatinine trend improving. -Trend BMP - Replete electrolytes as indicated

## 2023-07-22 NOTE — Plan of Care (Signed)

## 2023-07-22 NOTE — Care Management Important Message (Signed)
 Important Message  Patient Details  Name: Diane Freeman MRN: [id number] Date of Birth: Jul 21, 1945   Important Message Given:  Yes - Medicare IM     Wynonia Hedges 07/22/2023, 2:26 PM

## 2023-07-22 NOTE — Progress Notes (Signed)
     Daily Progress Note Intern Pager: [phone]  Patient name: Diane Freeman Medical record number: [id number] Date of birth: 06-Feb-1946 Age: 78 y.o. Gender: female  Primary Care Provider: Sarahann Cumins, DO Consultants: None Code Status: Full  Pt Overview and Major Events to Date:  4/16-admitted to ICU 4/20-transferred from ICU to floor status; care assumed by FMTS  Assessment and Plan: Diane Freeman is a 79 year old female initially admitted to the ICU for septic shock secondary to E. coli bacteremia.  She additionally was found to have acute right leg DVT.  Doing well today.   Assessment & Plan Septic shock (HCC), resolved  E. coli Bacteremia Vital signs stable, remains afebrile. - Continue IV ceftriaxone  - Monitor BPs, fever curve - Daily CBC, BMP AKI (acute kidney injury) (HCC) Likely prerenal in the setting of shock.  Creatinine trend improving. -Trend BMP - Replete electrolytes as indicated Paroxysmal atrial fibrillation with RVR (HCC) Heart rate controlled. CHAD-VASC 3. Per last cardiology, no current plan for anticoagulation secondary to previous GI bleed, but was planning for implantable loop recorder. - Now on Eliquis  10 mg BID (see below) - Currently on no rate control agents Normocytic anemia Likely inflammatory anemia secondary to Crohn's disease.  Given recent upper GI bleed, continue to trend CBC.  Hemoglobin stable this a.m., 10.1. - AM CBC Femoral deep venous thrombosis (DVT) (HCC) Stable. - Now on Eliquis  10mg  BID (4/20 - 4/25) to be transitioned to Eliquis  5 mg BID starting on 4/26. Chronic health problem Crohn's disease: Continue mesalamine  1500 mg daily, Carafate  1 g with meals and bedtime   FEN/GI: Regular diet PPx: Eliquis  10 mg twice daily Dispo:Pending PT recommendations  pending clinical improvement .   Subjective:  Seen this AM sitting up in bed with breakfast tray. She reports she is doing well. Normal BM yesterday, no dysuria, no  belly pain. Hopes to go home soon.  Objective: Temp:  [97.8 F (36.6 C)-97.9 F (36.6 C)] 97.8 F (36.6 C) (04/21 0758) Pulse Rate:  [68-87] 78 (04/21 0758) Resp:  [18-20] 18 (04/21 0758) BP: (109-133)/(60-84) 118/66 (04/21 0758) SpO2:  [95 %-98 %] 96 % (04/21 0758) Physical Exam: General: sitting up in bed eating breakfast, comfortable-appearing, NAD. Cardiovascular: RRR, no murmurs. Respiratory: CTA anteriorly, no wheezing. Normal work of breathing on room air. Abdomen: Soft, nontender, nondistended. Extremities: Moves all equally.  Laboratory: Most recent CBC Lab Results  Component Value Date   WBC 5.3 07/22/2023   HGB 10.1 (L) 07/22/2023   HCT 31.5 (L) 07/22/2023   MCV 84.0 07/22/2023   PLT 75 (L) 07/22/2023   Most recent BMP    Latest Ref Rng & Units 07/22/2023    9:46 AM  BMP  Glucose 70 - 99 mg/dL 147   BUN 8 - 23 mg/dL 14   Creatinine 8.29 - 1.00 mg/dL 5.62   Sodium 130 - 865 mmol/L 135   Potassium 3.5 - 5.1 mmol/L 3.5   Chloride 98 - 111 mmol/L 107   CO2 22 - 32 mmol/L 22   Calcium  8.9 - 10.3 mg/dL 7.5     Omar Bibber, DO 07/22/2023, 12:56 PM  PGY-1, [facility] FPTS Intern pager: [phone], text pages welcome Secure chat group [facility] Teaching Service

## 2023-07-22 NOTE — Assessment & Plan Note (Signed)
 Crohn's disease: Continue mesalamine  1500 mg daily, Carafate  1 g with meals and bedtime

## 2023-07-22 NOTE — [place] FL2 (Signed)
 [place]  MEDICAID FL2 LEVEL OF CARE FORM     IDENTIFICATION  Patient Name: Diane Freeman Birthdate: 1945/07/01 Sex: female Admission Date (Current Location): 07/17/2023  [place] and [place] Number:  Ernesto Heady [id number] T Facility and Address:  [facility], [address], [place], [place] [address]      Provider Number: [id number]  Attending Physician Name and Address:  McDiarmid, Demetra Filter, MD  Relative Name and Phone Number:  Theodora Fish, sister - [phone]    Current Level of Care: Hospital Recommended Level of Care: Skilled Nursing Facility Prior Approval Number:    Date Approved/Denied:   PASRR Number: pending  Discharge Plan: SNF    Current Diagnoses: Patient Active Problem List   Diagnosis Date Noted   Dementia (HCC)  Acute encephalopathy, resolved. 07/21/2023   Normocytic anemia 07/21/2023   Chronic health problem 07/21/2023   Femoral deep venous thrombosis (DVT) (HCC) 07/21/2023   Sepsis due to Escherichia coli with encephalopathy and septic shock (HCC) 07/21/2023   Malnutrition of moderate degree (HCC) 07/21/2023   Staph aureus methacillin-sensitive UTI 07/21/2023   AKI (acute kidney injury) (HCC) 07/17/2023   Esophagitis determined by endoscopy 06/11/2023   Hematemesis 06/09/2023   Other fatigue 12/30/2022   Physical deconditioning 12/06/2022   Paroxysmal atrial fibrillation with RVR (HCC) 12/05/2022   Septic shock (HCC), resolved  E. coli Bacteremia 12/03/2022   Actinic keratoses 02/17/2018   Hyperlipidemia 01/24/2018   Diastolic CHF (HCC) 06/14/2013   Crohn's disease (HCC) 06/10/2013   Hypokalemia 06/02/2011   ESOPHAGEAL STRICTURE 11/27/2007   GERD with esophagitis 11/27/2007    Orientation RESPIRATION BLADDER Height & Weight     Self, Time, Situation, Place  O2 External catheter Weight: 69.7 kg Height:  5\' 3"  (160 cm)  BEHAVIORAL SYMPTOMS/MOOD NEUROLOGICAL BOWEL NUTRITION STATUS      Continent Diet, Supplemental  (See Discharge Summary)  AMBULATORY STATUS COMMUNICATION OF NEEDS Skin   Extensive Assist Verbally Normal                       Personal Care Assistance Level of Assistance  Bathing, Feeding, Dressing Bathing Assistance: Limited assistance Feeding assistance: Limited assistance Dressing Assistance: Limited assistance     Functional Limitations Info  Sight, Hearing, Speech Sight Info: Adequate Hearing Info: Adequate Speech Info: Adequate    SPECIAL CARE FACTORS FREQUENCY  PT (By licensed PT), OT (By licensed OT)     PT Frequency: 5 x per week OT Frequency: 5 x per week            Contractures Contractures Info: Not present    Additional Factors Info  Code Status, Allergies Code Status Info: Full code Allergies Info: NKDA           Current Medications (07/22/2023):  This is the current hospital active medication list Current Facility-Administered Medications  Medication Dose Route Frequency Provider Last Rate Last Admin   apixaban  (ELIQUIS ) tablet 10 mg  10 mg Oral BID Baloch, Mahnoor, MD   10 mg at 07/22/23 1027   Followed by   Cecily Cohen ON 07/27/2023] apixaban  (ELIQUIS ) tablet 5 mg  5 mg Oral BID Baloch, Mahnoor, MD       cefTRIAXone  (ROCEPHIN ) 2 g in sodium chloride  0.9 % 100 mL IVPB  2 g Intravenous Q24H Fay Hoop, MD 200 mL/hr at 07/22/23 0902 2 g at 07/22/23 0902   Chlorhexidine  Gluconate Cloth 2 % PADS 6 each  6 each Topical Daily Albustami, Linna Richard, MD  6 each at 07/22/23 1030   feeding supplement (ENSURE ENLIVE / ENSURE PLUS) liquid 237 mL  237 mL Oral BID BM Patel, Pranav M, MD   237 mL at 07/21/23 1036   ipratropium-albuterol  (DUONEB) 0.5-2.5 (3) MG/3ML nebulizer solution 3 mL  3 mL Nebulization Q4H PRN Arlyne Bering, MD       mesalamine  (PENTASA ) CR capsule 1,500 mg  1,500 mg Oral Daily Jaquita Merl P, DO   1,500 mg at 07/22/23 1610   nystatin  (MYCOSTATIN /NYSTOP ) topical powder   Topical TID Smith, Joshua C, NP   Given at 07/22/23 1636    pantoprazole  (PROTONIX ) EC tablet 40 mg  40 mg Oral BID Albustami, Omar M, MD   40 mg at 07/22/23 9604   sucralfate  (CARAFATE ) tablet 1 g  1 g Oral TID WC & HS Jaquita Merl P, DO   1 g at 07/22/23 1637     Discharge Medications: Please see discharge summary for a list of discharge medications.  Relevant Imaging Results:  Relevant Lab Results:   Additional Information SS# [id number]  Dane Dung, RN

## 2023-07-22 NOTE — Assessment & Plan Note (Addendum)
 Likely inflammatory anemia secondary to Crohn's disease.  Given recent upper GI bleed, continue to trend CBC.  Hemoglobin stable this a.m., 10.1. - AM CBC

## 2023-07-22 NOTE — Assessment & Plan Note (Signed)
 Heart rate controlled. CHAD-VASC 3. Per last cardiology, no current plan for anticoagulation secondary to previous GI bleed, but was planning for implantable loop recorder. - Now on Eliquis  10 mg BID (see below) - Currently on no rate control agents

## 2023-07-22 NOTE — Progress Notes (Signed)
 Mobility Specialist: Progress Note   07/22/23 1531  Mobility  Activity Transferred from chair to bed  Level of Assistance Standby assist, set-up cues, supervision of patient - no hands on  Assistive Device Front wheel walker  Activity Response Tolerated well  Mobility Referral Yes  Mobility visit 1 Mobility  Mobility Specialist Start Time (ACUTE ONLY) 1450  Mobility Specialist Stop Time (ACUTE ONLY) 1503  Mobility Specialist Time Calculation (min) (ACUTE ONLY) 13 min    Pt requesting to return back to bed - received in chair. SV throughout. Very fatigued and SOB just from transfer. SpO2 WFL on RA. Left in supine with all needs met, call bell in reach.   Deloria Fetch Mobility Specialist Please contact via SecureChat or Rehab office at [phone]

## 2023-07-22 NOTE — TOC Progression Note (Signed)
 Transition of Care [facility]) - Progression Note    Patient Details  Name: Diane Freeman MRN: [id number] Date of Birth: 02/27/1946  [facility]) CM/SW Contact  Dane Dung, RN Phone Number: 07/22/2023, 4:42 PM  Clinical Narrative:    CM spoke with attending MD and patient was agreeable to SNF placement.  FL2 completed with pending PASRR.  Patient was faxed out in the hub for bed offers.  MSW will follow up tomorrow to provide bed offers when available.     Barriers to Discharge: Continued Medical Work up  Expected Discharge Plan and Services       Living arrangements for the past 2 months: Mobile Home                                       Social Determinants of Health (SDOH) Interventions SDOH Screenings   Food Insecurity: No Food Insecurity (07/18/2023)  Housing: Low Risk  (07/18/2023)  Transportation Needs: No Transportation Needs (07/18/2023)  Utilities: Not At Risk (07/18/2023)  Alcohol Screen: Low Risk  (12/31/2022)  Depression (PHQ2-9): Medium Risk (06/14/2023)  Financial Resource Strain: Low Risk  (12/31/2022)  Physical Activity: Insufficiently Active (12/31/2022)  Social Connections: Moderately Isolated (07/18/2023)  Stress: No Stress Concern Present (12/31/2022)  Tobacco Use: Low Risk  (07/12/2023)  Health Literacy: Adequate Health Literacy (12/31/2022)    Readmission Risk Interventions    06/11/2023   11:24 AM  Readmission Risk Prevention Plan  Post Dischage Appt Complete  Medication Screening Complete  Transportation Screening Complete

## 2023-07-22 NOTE — Assessment & Plan Note (Addendum)
 Stable. - Now on Eliquis  10mg  BID (4/20 - 4/25) to be transitioned to Eliquis  5 mg BID starting on 4/26.

## 2023-07-23 ENCOUNTER — Encounter (HOSPITAL_COMMUNITY): Payer: Self-pay | Admitting: Pulmonary Disease

## 2023-07-23 DIAGNOSIS — A4901 Methicillin susceptible Staphylococcus aureus infection, unspecified site: Secondary | ICD-10-CM | POA: Diagnosis not present

## 2023-07-23 DIAGNOSIS — E440 Moderate protein-calorie malnutrition: Secondary | ICD-10-CM | POA: Diagnosis not present

## 2023-07-23 DIAGNOSIS — A4151 Sepsis due to Escherichia coli [E. coli]: Secondary | ICD-10-CM | POA: Diagnosis not present

## 2023-07-23 DIAGNOSIS — I480 Paroxysmal atrial fibrillation: Secondary | ICD-10-CM

## 2023-07-23 DIAGNOSIS — I82411 Acute embolism and thrombosis of right femoral vein: Secondary | ICD-10-CM | POA: Diagnosis not present

## 2023-07-23 LAB — BASIC METABOLIC PANEL WITH GFR
Anion gap: 7 (ref 5–15)
BUN: 12 mg/dL (ref 8–23)
CO2: 22 mmol/L (ref 22–32)
Calcium: 7.5 mg/dL — ABNORMAL LOW (ref 8.9–10.3)
Chloride: 105 mmol/L (ref 98–111)
Creatinine, Ser: 1.08 mg/dL — ABNORMAL HIGH (ref 0.44–1.00)
GFR, Estimated: 52 mL/min — ABNORMAL LOW (ref >60)
Glucose, Bld: 92 mg/dL (ref 70–99)
Potassium: 3.5 mmol/L (ref 3.5–5.1)
Sodium: 134 mmol/L — ABNORMAL LOW (ref 135–145)

## 2023-07-23 LAB — CBC
HCT: 29.9 % — ABNORMAL LOW (ref 36.0–46.0)
Hemoglobin: 9.4 g/dL — ABNORMAL LOW (ref 12.0–15.0)
MCH: 26.5 pg (ref 26.0–34.0)
MCHC: 31.4 g/dL (ref 30.0–36.0)
MCV: 84.2 fL (ref 80.0–100.0)
Platelets: 79 10*3/uL — ABNORMAL LOW (ref 150–400)
RBC: 3.55 MIL/uL — ABNORMAL LOW (ref 3.87–5.11)
RDW: 16.4 % — ABNORMAL HIGH (ref 11.5–15.5)
WBC: 5 10*3/uL (ref 4.0–10.5)
nRBC: 0 % (ref 0.0–0.2)

## 2023-07-23 LAB — PHOSPHORUS: Phosphorus: 2.4 mg/dL — ABNORMAL LOW (ref 2.5–4.6)

## 2023-07-23 LAB — MAGNESIUM: Magnesium: 1.9 mg/dL (ref 1.7–2.4)

## 2023-07-23 MED ORDER — POTASSIUM PHOSPHATES 15 MMOLE/5ML IV SOLN
15.0000 mmol | Freq: Once | INTRAVENOUS | Status: AC
  Administered 2023-07-23: 15 mmol via INTRAVENOUS
  Filled 2023-07-23: qty 5

## 2023-07-23 NOTE — Assessment & Plan Note (Addendum)
 Heart rate controlled. CHAD-VASC 3. Per last cardiology, no current plan for anticoagulation secondary to previous GI bleed, but was planning for implantable loop recorder. - Now on Eliquis  10 mg BID (see below) - Currently on no rate control agents

## 2023-07-23 NOTE — NC FL2 (Cosign Needed)
 [place]  MEDICAID FL2 LEVEL OF CARE FORM     IDENTIFICATION  Patient Name: Diane Freeman Birthdate: 1945/12/09 Sex: female Admission Date (Current Location): 07/17/2023  [place] and [place] Number:  Ernesto Heady [id number] T Facility and Address:  [facility], [address] [place], [place] [address]      Provider Number: [id number]  Attending Physician Name and Address:  McDiarmid, Demetra Filter, MD  Relative Name and Phone Number:  Theodora Fish, sister - [phone]    Current Level of Care: Hospital Recommended Level of Care: Skilled Nursing Facility Prior Approval Number:    Date Approved/Denied:   PASRR Number: [id number]  Discharge Plan: SNF    Current Diagnoses: Patient Active Problem List   Diagnosis Date Noted   Dementia [facility])  Acute encephalopathy, resolved. 07/21/2023   Normocytic anemia 07/21/2023   Chronic health problem 07/21/2023   Femoral deep venous thrombosis (DVT) (HCC) 07/21/2023   Sepsis due to Escherichia coli with encephalopathy and septic shock (HCC) 07/21/2023   Malnutrition of moderate degree (HCC) 07/21/2023   Staph aureus methacillin-sensitive UTI 07/21/2023   AKI (acute kidney injury) (HCC) 07/17/2023   Esophagitis determined by endoscopy 06/11/2023   Hematemesis 06/09/2023   Other fatigue 12/30/2022   Physical deconditioning 12/06/2022   Paroxysmal atrial fibrillation with RVR (HCC) 12/05/2022   Septic shock (HCC), resolved  E. coli Bacteremia 12/03/2022   Actinic keratoses 02/17/2018   Hyperlipidemia 01/24/2018   Diastolic CHF (HCC) 06/14/2013   Crohn's disease (HCC) 06/10/2013   Hypokalemia 06/02/2011   ESOPHAGEAL STRICTURE 11/27/2007   GERD with esophagitis 11/27/2007    Orientation RESPIRATION BLADDER Height & Weight     Self, Time, Situation, Place  O2 External catheter Weight: 154 lb 12.2 oz (70.2 kg) Height:  5\' 3"  (160 cm)  BEHAVIORAL SYMPTOMS/MOOD NEUROLOGICAL BOWEL NUTRITION STATUS       Continent Diet, Supplemental (See Discharge Summary)  AMBULATORY STATUS COMMUNICATION OF NEEDS Skin   Extensive Assist Verbally Normal                       Personal Care Assistance Level of Assistance  Bathing, Feeding, Dressing Bathing Assistance: Limited assistance Feeding assistance: Limited assistance Dressing Assistance: Limited assistance     Functional Limitations Info  Sight, Hearing, Speech Sight Info: Adequate Hearing Info: Adequate Speech Info: Adequate    SPECIAL CARE FACTORS FREQUENCY  PT (By licensed PT), OT (By licensed OT)     PT Frequency: 5 x per week OT Frequency: 5 x per week            Contractures Contractures Info: Not present    Additional Factors Info  Code Status, Allergies Code Status Info: Full code Allergies Info: NKDA           Current Medications (07/23/2023):  This is the current hospital active medication list Current Facility-Administered Medications  Medication Dose Route Frequency Provider Last Rate Last Admin   apixaban  (ELIQUIS ) tablet 10 mg  10 mg Oral BID Baloch, Mahnoor, MD   10 mg at 07/23/23 0844   Followed by   Cecily Cohen ON 07/27/2023] apixaban  (ELIQUIS ) tablet 5 mg  5 mg Oral BID Baloch, Mahnoor, MD       cefTRIAXone  (ROCEPHIN ) 2 g in sodium chloride  0.9 % 100 mL IVPB  2 g Intravenous Q24H Fay Hoop, MD 200 mL/hr at 07/23/23 0850 2 g at 07/23/23 0850   Chlorhexidine  Gluconate Cloth 2 % PADS 6 each  6 each Topical Daily Albustami,  Linna Richard, MD   6 each at 07/23/23 0901   feeding supplement (ENSURE ENLIVE / ENSURE PLUS) liquid 237 mL  237 mL Oral BID BM Patel, Pranav M, MD   237 mL at 07/21/23 1036   ipratropium-albuterol  (DUONEB) 0.5-2.5 (3) MG/3ML nebulizer solution 3 mL  3 mL Nebulization Q4H PRN Arlyne Bering, MD       mesalamine  (PENTASA ) CR capsule 1,500 mg  1,500 mg Oral Daily Jaquita Merl P, DO   1,500 mg at 07/23/23 1610   nystatin  (MYCOSTATIN /NYSTOP ) topical powder   Topical TID Smith, Joshua C, NP   Given  at 07/23/23 0844   pantoprazole  (PROTONIX ) EC tablet 40 mg  40 mg Oral BID Albustami, Omar M, MD   40 mg at 07/23/23 0844   potassium PHOSPHATE 15 mmol in dextrose  5 % 250 mL infusion  15 mmol Intravenous Once Baloch, Mahnoor, MD 43 mL/hr at 07/23/23 1453 15 mmol at 07/23/23 1453   sucralfate  (CARAFATE ) tablet 1 g  1 g Oral TID WC & HS Jaquita Merl P, DO   1 g at 07/23/23 1226     Discharge Medications: Please see discharge summary for a list of discharge medications.  Relevant Imaging Results:  Relevant Lab Results:   Additional Information SS# [id number]  Jameson Morrow A [place], LCSW

## 2023-07-23 NOTE — Progress Notes (Signed)
 Mobility Specialist: Progress Note   07/23/23 1627  Mobility  Activity Ambulated with assistance in room;Transferred from bed to chair  Level of Assistance Contact guard assist, steadying assist  Assistive Device Front wheel walker  Distance Ambulated (ft) 10 ft  Activity Response Tolerated well  Mobility Referral Yes  Mobility visit 1 Mobility  Mobility Specialist Start Time (ACUTE ONLY) 1442  Mobility Specialist Stop Time (ACUTE ONLY) 1454  Mobility Specialist Time Calculation (min) (ACUTE ONLY) 12 min    Pt was agreeable to mobility session - received in bed. Cg for bed mobility to assist with trunk elevation. MinG for STS. Cg for ambulation around the bed to chair. Small BM in bed, MS assisted with pericare while pt stood in front of the chair. Left in chair with all needs met, call bell in reach. Chair alarm on.   Deloria Fetch Mobility Specialist Please contact via SecureChat or Rehab office at [phone]

## 2023-07-23 NOTE — Plan of Care (Signed)

## 2023-07-23 NOTE — Assessment & Plan Note (Addendum)
 Likely inflammatory anemia secondary to Crohn's disease.  Given recent upper GI bleed, continue to trend CBC.  Hgb down slightly today to 9.4. Asymptomatic today. - AM CBC

## 2023-07-23 NOTE — Assessment & Plan Note (Addendum)
 Crohn's disease: Continue mesalamine  1500 mg daily, Carafate  1 g with meals and bedtime

## 2023-07-23 NOTE — Assessment & Plan Note (Addendum)
 Stable. - Now on Eliquis  10mg  BID (4/20 - 4/25) to be transitioned to Eliquis  5 mg BID starting on 4/26.

## 2023-07-23 NOTE — Assessment & Plan Note (Addendum)
 Vital signs stable, remains afebrile. - Continue IV ceftriaxone  (4/18 - 4/23) - Monitor BPs, fever curve - Daily CBC, BMP

## 2023-07-23 NOTE — TOC Progression Note (Addendum)
 Transition of Care [facility]) - Progression Note    Patient Details  Name: Diane Freeman MRN: [id number] Date of Birth: [birth year]  Transition of Care [facility]) CM/SW Contact  Takari Lundahl A [place], LCSW Phone Number: 07/23/2023, 2:04 PM  Clinical Narrative:     Update 1520 CSW spoke with pt's sister Russ Course, notified of pending DC tomorrow. [facility] has bed available then. Provider informed CSW probable medical stability then.    TOC will continue to follow.    Update 1412 CSW was contacted by Russ Course, CSW provided bed offers with Medicare.gov ratings. She selected [facility] for SNF. CSW reached out regarding bed availability, waiting for response regarding bed openings.    TOC will continue to follow.   CSW met with pt at bedside to provide bed offers. She requested CSW to reach out to her sister, Russ Course. CSW contacted pt and left voicemail with contact information to reach out to CSW.   TOC will continue to follow.     Barriers to Discharge: Continued Medical Work up  Expected Discharge Plan and Services       Living arrangements for the past 2 months: Mobile Home                                       Social Determinants of Health (SDOH) Interventions SDOH Screenings   Food Insecurity: No Food Insecurity (07/18/2023)  Housing: Low Risk  (07/18/2023)  Transportation Needs: No Transportation Needs (07/18/2023)  Utilities: Not At Risk (07/18/2023)  Alcohol Screen: Low Risk  (12/31/2022)  Depression (PHQ2-9): Medium Risk (06/14/2023)  Financial Resource Strain: Low Risk  (12/31/2022)  Physical Activity: Insufficiently Active (12/31/2022)  Social Connections: Moderately Isolated (07/18/2023)  Stress: No Stress Concern Present (12/31/2022)  Tobacco Use: Low Risk  (07/12/2023)  Health Literacy: Adequate Health Literacy (12/31/2022)    Readmission Risk Interventions    06/11/2023   11:24 AM  Readmission Risk Prevention Plan  Post Dischage Appt Complete  Medication Screening  Complete  Transportation Screening Complete

## 2023-07-23 NOTE — Progress Notes (Signed)
 Occupational Therapy Treatment Patient Details Name: Diane Freeman MRN: [id number] DOB: April 04, 1945 Today's Date: 07/23/2023   History of present illness Pt is a 78 y/o F presenting to ED on 4/16 with weakness, chills, prolonged heat exposure, admitted for septic shock. Found to have RLE DVT, started on heparin  4/17. PT with symptomatic orthostatic hypotension during PT session 07/22/23. PMH includes CHF, A fib RVR, chrohn's disease, cognitive impairment.   OT comments  Patient received in supine and agreeable to OT treatment. BP while supine 106/59 (72). Compression stocking donned while supine with BP 121/68 (85). Patient able to get to EOB with min assist with BP at 135/77 (94). Therapist noticed patient was bleeding at RUE at IV site and appeared IV came out. Patient was assisted back to supine to allow nursing to address. BP while back in supine 122/66 (84). Discharge recommendations changed from [facility] to SNF due to patient would benefit from continued inpatient follow up therapy, <3 hours/day to allow for safe transition home. Acute OT to continue to follow to address established goals to facilitate DC to next venue of care.       If plan is discharge home, recommend the following:  A little help with walking and/or transfers;A little help with bathing/dressing/bathroom;Assistance with cooking/housework;Assist for transportation;Help with stairs or ramp for entrance   Equipment Recommendations  Tub/shower seat    Recommendations for Other Services      Precautions / Restrictions Precautions Precautions: Fall;Other (comment) Recall of Precautions/Restrictions: Impaired Precaution/Restrictions Comments: symptomatic orthostatic hypotension Required Braces or Orthoses:  (compression stockings) Restrictions Weight Bearing Restrictions Per Provider Order: No       Mobility Bed Mobility Overal bed mobility: Needs Assistance Bed Mobility: Supine to Sit, Sit to Supine     Supine to  sit: Min assist Sit to supine: Min assist   General bed mobility comments: assistance with scooting to EOB with bed pad and assistance with BLE to return to supine    Transfers Overall transfer level: Needs assistance                 General transfer comment: not attempted due to bleeding at IV site     Balance Overall balance assessment: Needs assistance Sitting-balance support: Feet supported Sitting balance-Leahy Scale: Good                                     ADL either performed or assessed with clinical judgement   ADL Overall ADL's : Needs assistance/impaired     Grooming: Set up;Sitting               Lower Body Dressing: Maximal assistance;Bed level Lower Body Dressing Details (indicate cue type and reason): to donn compression stocksing and non-skid socks               General ADL Comments: limited self care tasks due to bleeding at IV site    Extremity/Trunk Assessment              Vision       Perception     Praxis     Communication Communication Communication: No apparent difficulties   Cognition Arousal: Alert Behavior During Therapy: WFL for tasks assessed/performed Cognition: No apparent impairments                               Following commands:  Intact        Cueing   Cueing Techniques: Verbal cues  Exercises      Shoulder Instructions       General Comments BP supine without compression stockings 106/59 (72), supine with compression stockings 121/68 (85), seated on EOB with stockings 135/77 (94), back in supine 122/66 (84)    Pertinent Vitals/ Pain       Pain Assessment Pain Assessment: No/denies pain  Home Living                                          Prior Functioning/Environment              Frequency  Min 2X/week        Progress Toward Goals  OT Goals(current goals can now be found in the care plan section)  Progress towards OT goals:  Progressing toward goals  Acute Rehab OT Goals Patient Stated Goal: none stated OT Goal Formulation: With patient Time For Goal Achievement: 08/03/23 Potential to Achieve Goals: Good ADL Goals Pt Will Perform Upper Body Dressing: with modified independence;sitting Pt Will Perform Lower Body Dressing: with modified independence;sitting/lateral leans;sit to/from stand Pt Will Transfer to Toilet: with modified independence;ambulating;regular height toilet Pt Will Perform Tub/Shower Transfer: Shower transfer;with modified independence;ambulating Additional ADL Goal #1: pt will tolerate OOB activity x10 min with SpO2 above 90% in prep fro ADLs  Plan      Co-evaluation                 AM-PAC OT "6 Clicks" Daily Activity     Outcome Measure   Help from another person eating meals?: A Little Help from another person taking care of personal grooming?: A Little Help from another person toileting, which includes using toliet, bedpan, or urinal?: A Little Help from another person bathing (including washing, rinsing, drying)?: A Little Help from another person to put on and taking off regular upper body clothing?: A Little Help from another person to put on and taking off regular lower body clothing?: A Lot 6 Click Score: 17    End of Session Equipment Utilized During Treatment: Oxygen (cmpression stockings)  OT Visit Diagnosis: Unsteadiness on feet (R26.81);Other abnormalities of gait and mobility (R26.89);Muscle weakness (generalized) (M62.81)   Activity Tolerance Patient tolerated treatment well   Patient Left in bed;with call bell/phone within reach;with bed alarm set   Nurse Communication Mobility status;Other (comment) (bleeding at IV site on RUE)        Time: 1610-9604 OT Time Calculation (min): 27 min  Charges: OT General Charges $OT Visit: 1 Visit OT Treatments $Self Care/Home Management : 8-22 mins $Therapeutic Activity: 8-22 mins  Anitra Barn, OTA Acute  Rehabilitation Services  Office [phone]   Jovita Nipper 07/23/2023, 10:31 AM

## 2023-07-23 NOTE — Assessment & Plan Note (Addendum)
 Likely prerenal in the setting of shock.  Creatinine trend continues to improve. Phosphorus repleted. -Trend BMP - Replete electrolytes as indicated

## 2023-07-23 NOTE — Progress Notes (Signed)
     Daily Progress Note Intern Pager: [phone]  Patient name: Diane Freeman Medical record number: [id number] Date of birth: 04-03-1945 Age: 78 y.o. Gender: female  Primary Care Provider: Sarahann Cumins, DO Consultants: None Code Status: Full  Pt Overview and Major Events to Date:  4/16-admitted to ICU 4/20-transferred from ICU to floor status; care assumed by FMTS  Assessment and Plan: Christal Fish is a 78 year old female initially admitted to the ICU for septic shock secondary to E. coli bacteremia.  Also found to have an acute right leg DVT.  Undergoing antibiotic therapy and on Eliquis .  Doing well today.  Awaiting placement at SNF which would benefit her greatly and is medically appropriate. Assessment & Plan Septic shock (HCC), resolved  E. coli Bacteremia Vital signs stable, remains afebrile. - Continue IV ceftriaxone  (4/18 - 4/23) - Monitor BPs, fever curve - Daily CBC, BMP AKI (acute kidney injury) (HCC) Likely prerenal in the setting of shock.  Creatinine trend continues to improve. Phosphorus repleted. -Trend BMP - Replete electrolytes as indicated Paroxysmal atrial fibrillation with RVR (HCC) Heart rate controlled. CHAD-VASC 3. Per last cardiology, no current plan for anticoagulation secondary to previous GI bleed, but was planning for implantable loop recorder. - Now on Eliquis  10 mg BID (see below) - Currently on no rate control agents Normocytic anemia Likely inflammatory anemia secondary to Crohn's disease.  Given recent upper GI bleed, continue to trend CBC.  Hgb down slightly today to 9.4. Asymptomatic today. - AM CBC Femoral deep venous thrombosis (DVT) (HCC) Stable. - Now on Eliquis  10mg  BID (4/20 - 4/25) to be transitioned to Eliquis  5 mg BID starting on 4/26. Chronic health problem Crohn's disease: Continue mesalamine  1500 mg daily, Carafate  1 g with meals and bedtime   FEN/GI: Regular diet PPx: Eliquis  10 mg twice daily Dispo:SNF  pending  placement . Barriers include appropriate SNF placement.   Subjective:  Patient seen this morning sitting up in bed working with physical therapy.  She denies pain.  No other concerns.  Objective: Temp:  [97.7 F (36.5 C)-99.2 F (37.3 C)] 97.7 F (36.5 C) (04/22 0842) Pulse Rate:  [83-88] 88 (04/22 0842) Resp:  [16-18] 16 (04/22 0842) BP: (114-143)/(54-72) 143/72 (04/22 0842) SpO2:  [91 %-98 %] 98 % (04/22 0842) Weight:  [70.2 kg] 70.2 kg (04/22 0500) Physical Exam: General: Sitting up in bed, NAD Cardiovascular: RRR Respiratory: CTA bilaterally, normal work of breathing Abdomen: Soft, nontender Extremities: Moves all equally  Laboratory: Most recent CBC Lab Results  Component Value Date   WBC 5.0 07/23/2023   HGB 9.4 (L) 07/23/2023   HCT 29.9 (L) 07/23/2023   MCV 84.2 07/23/2023   PLT 79 (L) 07/23/2023   Most recent BMP    Latest Ref Rng & Units 07/23/2023    4:26 AM  BMP  Glucose 70 - 99 mg/dL 92   BUN 8 - 23 mg/dL 12   Creatinine 6.29 - 1.00 mg/dL 5.28   Sodium 413 - 244 mmol/L 134   Potassium 3.5 - 5.1 mmol/L 3.5   Chloride 98 - 111 mmol/L 105   CO2 22 - 32 mmol/L 22   Calcium  8.9 - 10.3 mg/dL 7.5      Omar Bibber, DO 07/23/2023, 12:35 PM  PGY-1, [facility] FPTS Intern pager: [phone], text pages welcome Secure chat group [facility] Teaching Service

## 2023-07-24 ENCOUNTER — Ambulatory Visit: Admitting: Student

## 2023-07-24 DIAGNOSIS — I5030 Unspecified diastolic (congestive) heart failure: Secondary | ICD-10-CM | POA: Diagnosis not present

## 2023-07-24 DIAGNOSIS — B354 Tinea corporis: Secondary | ICD-10-CM | POA: Diagnosis not present

## 2023-07-24 DIAGNOSIS — L409 Psoriasis, unspecified: Secondary | ICD-10-CM | POA: Diagnosis not present

## 2023-07-24 DIAGNOSIS — R0600 Dyspnea, unspecified: Secondary | ICD-10-CM | POA: Diagnosis not present

## 2023-07-24 DIAGNOSIS — R41841 Cognitive communication deficit: Secondary | ICD-10-CM | POA: Diagnosis not present

## 2023-07-24 DIAGNOSIS — R1900 Intra-abdominal and pelvic swelling, mass and lump, unspecified site: Secondary | ICD-10-CM | POA: Diagnosis not present

## 2023-07-24 DIAGNOSIS — N390 Urinary tract infection, site not specified: Secondary | ICD-10-CM | POA: Diagnosis not present

## 2023-07-24 DIAGNOSIS — I480 Paroxysmal atrial fibrillation: Secondary | ICD-10-CM | POA: Diagnosis not present

## 2023-07-24 DIAGNOSIS — R0902 Hypoxemia: Secondary | ICD-10-CM | POA: Diagnosis not present

## 2023-07-24 DIAGNOSIS — R9389 Abnormal findings on diagnostic imaging of other specified body structures: Secondary | ICD-10-CM | POA: Diagnosis not present

## 2023-07-24 DIAGNOSIS — R0602 Shortness of breath: Secondary | ICD-10-CM | POA: Diagnosis not present

## 2023-07-24 DIAGNOSIS — K209 Esophagitis, unspecified without bleeding: Secondary | ICD-10-CM

## 2023-07-24 DIAGNOSIS — R410 Disorientation, unspecified: Secondary | ICD-10-CM | POA: Diagnosis not present

## 2023-07-24 DIAGNOSIS — B9561 Methicillin susceptible Staphylococcus aureus infection as the cause of diseases classified elsewhere: Secondary | ICD-10-CM | POA: Diagnosis not present

## 2023-07-24 DIAGNOSIS — M7989 Other specified soft tissue disorders: Secondary | ICD-10-CM | POA: Diagnosis present

## 2023-07-24 DIAGNOSIS — K2100 Gastro-esophageal reflux disease with esophagitis, without bleeding: Secondary | ICD-10-CM | POA: Diagnosis not present

## 2023-07-24 DIAGNOSIS — E440 Moderate protein-calorie malnutrition: Secondary | ICD-10-CM | POA: Diagnosis not present

## 2023-07-24 DIAGNOSIS — A4901 Methicillin susceptible Staphylococcus aureus infection, unspecified site: Secondary | ICD-10-CM | POA: Diagnosis not present

## 2023-07-24 DIAGNOSIS — I4891 Unspecified atrial fibrillation: Secondary | ICD-10-CM | POA: Diagnosis not present

## 2023-07-24 DIAGNOSIS — R609 Edema, unspecified: Secondary | ICD-10-CM | POA: Diagnosis not present

## 2023-07-24 DIAGNOSIS — R0689 Other abnormalities of breathing: Secondary | ICD-10-CM | POA: Diagnosis not present

## 2023-07-24 DIAGNOSIS — R0989 Other specified symptoms and signs involving the circulatory and respiratory systems: Secondary | ICD-10-CM | POA: Diagnosis not present

## 2023-07-24 DIAGNOSIS — R2681 Unsteadiness on feet: Secondary | ICD-10-CM | POA: Diagnosis not present

## 2023-07-24 DIAGNOSIS — R5381 Other malaise: Secondary | ICD-10-CM | POA: Diagnosis not present

## 2023-07-24 DIAGNOSIS — K920 Hematemesis: Secondary | ICD-10-CM | POA: Diagnosis not present

## 2023-07-24 DIAGNOSIS — L570 Actinic keratosis: Secondary | ICD-10-CM | POA: Diagnosis not present

## 2023-07-24 DIAGNOSIS — E785 Hyperlipidemia, unspecified: Secondary | ICD-10-CM | POA: Diagnosis not present

## 2023-07-24 DIAGNOSIS — Z7401 Bed confinement status: Secondary | ICD-10-CM | POA: Diagnosis not present

## 2023-07-24 DIAGNOSIS — R000 Tachycardia, unspecified: Secondary | ICD-10-CM | POA: Diagnosis not present

## 2023-07-24 DIAGNOSIS — R4182 Altered mental status, unspecified: Secondary | ICD-10-CM | POA: Diagnosis not present

## 2023-07-24 DIAGNOSIS — Z7901 Long term (current) use of anticoagulants: Secondary | ICD-10-CM | POA: Diagnosis not present

## 2023-07-24 DIAGNOSIS — R6521 Severe sepsis with septic shock: Secondary | ICD-10-CM | POA: Diagnosis not present

## 2023-07-24 DIAGNOSIS — F0280 Dementia in other diseases classified elsewhere without behavioral disturbance: Secondary | ICD-10-CM | POA: Diagnosis not present

## 2023-07-24 DIAGNOSIS — I82411 Acute embolism and thrombosis of right femoral vein: Secondary | ICD-10-CM | POA: Diagnosis not present

## 2023-07-24 DIAGNOSIS — R079 Chest pain, unspecified: Secondary | ICD-10-CM | POA: Diagnosis not present

## 2023-07-24 DIAGNOSIS — R278 Other lack of coordination: Secondary | ICD-10-CM | POA: Diagnosis not present

## 2023-07-24 DIAGNOSIS — Z743 Need for continuous supervision: Secondary | ICD-10-CM | POA: Diagnosis not present

## 2023-07-24 DIAGNOSIS — A4151 Sepsis due to Escherichia coli [E. coli]: Secondary | ICD-10-CM | POA: Diagnosis not present

## 2023-07-24 DIAGNOSIS — G9340 Encephalopathy, unspecified: Secondary | ICD-10-CM | POA: Diagnosis not present

## 2023-07-24 DIAGNOSIS — J9811 Atelectasis: Secondary | ICD-10-CM | POA: Diagnosis not present

## 2023-07-24 DIAGNOSIS — I82401 Acute embolism and thrombosis of unspecified deep veins of right lower extremity: Secondary | ICD-10-CM | POA: Diagnosis not present

## 2023-07-24 DIAGNOSIS — N179 Acute kidney failure, unspecified: Secondary | ICD-10-CM | POA: Diagnosis not present

## 2023-07-24 DIAGNOSIS — I959 Hypotension, unspecified: Secondary | ICD-10-CM | POA: Diagnosis not present

## 2023-07-24 DIAGNOSIS — R600 Localized edema: Secondary | ICD-10-CM | POA: Diagnosis not present

## 2023-07-24 DIAGNOSIS — D649 Anemia, unspecified: Secondary | ICD-10-CM | POA: Diagnosis not present

## 2023-07-24 DIAGNOSIS — G9341 Metabolic encephalopathy: Secondary | ICD-10-CM | POA: Diagnosis not present

## 2023-07-24 DIAGNOSIS — R140 Abdominal distension (gaseous): Secondary | ICD-10-CM | POA: Diagnosis not present

## 2023-07-24 DIAGNOSIS — Z741 Need for assistance with personal care: Secondary | ICD-10-CM | POA: Diagnosis not present

## 2023-07-24 DIAGNOSIS — F0390 Unspecified dementia without behavioral disturbance: Secondary | ICD-10-CM | POA: Diagnosis not present

## 2023-07-24 DIAGNOSIS — M6281 Muscle weakness (generalized): Secondary | ICD-10-CM | POA: Diagnosis not present

## 2023-07-24 DIAGNOSIS — R5383 Other fatigue: Secondary | ICD-10-CM | POA: Diagnosis not present

## 2023-07-24 LAB — CBC
HCT: 28.1 % — ABNORMAL LOW (ref 36.0–46.0)
Hemoglobin: 9.2 g/dL — ABNORMAL LOW (ref 12.0–15.0)
MCH: 27.3 pg (ref 26.0–34.0)
MCHC: 32.7 g/dL (ref 30.0–36.0)
MCV: 83.4 fL (ref 80.0–100.0)
Platelets: 97 10*3/uL — ABNORMAL LOW (ref 150–400)
RBC: 3.37 MIL/uL — ABNORMAL LOW (ref 3.87–5.11)
RDW: 16.6 % — ABNORMAL HIGH (ref 11.5–15.5)
WBC: 5 10*3/uL (ref 4.0–10.5)
nRBC: 0.4 % — ABNORMAL HIGH (ref 0.0–0.2)

## 2023-07-24 LAB — BASIC METABOLIC PANEL WITH GFR
Anion gap: 9 (ref 5–15)
BUN: 11 mg/dL (ref 8–23)
CO2: 20 mmol/L — ABNORMAL LOW (ref 22–32)
Calcium: 7.3 mg/dL — ABNORMAL LOW (ref 8.9–10.3)
Chloride: 104 mmol/L (ref 98–111)
Creatinine, Ser: 0.97 mg/dL (ref 0.44–1.00)
GFR, Estimated: 59 mL/min — ABNORMAL LOW (ref >60)
Glucose, Bld: 89 mg/dL (ref 70–99)
Potassium: 3.4 mmol/L — ABNORMAL LOW (ref 3.5–5.1)
Sodium: 133 mmol/L — ABNORMAL LOW (ref 135–145)

## 2023-07-24 LAB — PHOSPHORUS: Phosphorus: 2.5 mg/dL (ref 2.5–4.6)

## 2023-07-24 MED ORDER — APIXABAN 5 MG PO TABS: ORAL_TABLET | ORAL | Status: DC

## 2023-07-24 MED ORDER — ENSURE ENLIVE PO LIQD: 237.0000 mL | Freq: Two times a day (BID) | ORAL | Status: DC

## 2023-07-24 MED ORDER — POTASSIUM CHLORIDE CRYS ER 20 MEQ PO TBCR
20.0000 meq | EXTENDED_RELEASE_TABLET | Freq: Two times a day (BID) | ORAL | Status: DC
  Administered 2023-07-24: 20 meq via ORAL
  Filled 2023-07-24: qty 1

## 2023-07-24 NOTE — Progress Notes (Signed)
 Mobility Specialist: Progress Note   07/24/23 0953  Mobility  Activity Ambulated with assistance in hallway;Transferred to/from [facility]  Level of Assistance Contact guard assist, steadying assist  Assistive Device Front wheel walker  Distance Ambulated (ft) 50 ft  Activity Response Tolerated well  Mobility Referral Yes  Mobility visit 1 Mobility  Mobility Specialist Start Time (ACUTE ONLY) [id number]  Mobility Specialist Stop Time (ACUTE ONLY) 0940  Mobility Specialist Time Calculation (min) (ACUTE ONLY) 18 min    Pt was agreeable to mobility session - received in bed. Compression stockings already on. Incontinent of bowels in bed, requested to use BSC. CG for bed mobility to assist trunk elevation. MinA for boost to stand from EOB. SV for stand pivot transfer to [facility]. Small BM and void successful - MS assisted with pericare while pt stood with SV for ~1 min. Still agreeable to ambulation. CG for ambulation. Ambulated a few feet in the hallway and then to the chair. C/o feeling weak. Left in chair with all needs met, call bell in reach. Chair alarm on.   Deloria Fetch Mobility Specialist Please contact via SecureChat or Rehab office at [phone]

## 2023-07-24 NOTE — TOC Transition Note (Signed)
 Transition of Care [facility]) - Discharge Note   Patient Details  Name: Diane Freeman MRN: [id number] Date of Birth: [birth year]  Transition of Care [facility]) CM/SW Contact:  Cereniti Curb A [place], LCSW Phone Number: 07/24/2023, 11:10 AM   Clinical Narrative:     Patient will DC to: [facility]  Anticipated DC date: 07/24/23  Family notified: Theodora Fish  Transport by: Lyna Sandhoff      Per MD patient ready for DC to [facility]. RN, patient, patient's family, and facility notified of DC. Discharge Summary and FL2 sent to facility. RN to call report prior to discharge (Room 30, [phone]). DC packet on chart. Ambulance transport requested for patient.     CSW will sign off for now as social work intervention is no longer needed. Please consult us  again if new needs arise.   Final next level of care: Skilled Nursing Facility Barriers to Discharge: Barriers Resolved   Patient Goals and CMS Choice Patient states their goals for this hospitalization and ongoing recovery are:: to return home          Discharge Placement              Patient chooses bed at: [facility] Patient to be transferred to facility by: [facility] Name of family member notified: Theodora Fish Patient and family notified of of transfer: 07/24/23  Discharge Plan and Services Additional resources added to the After Visit Summary for                                       Social Drivers of Health (SDOH) Interventions SDOH Screenings   Food Insecurity: No Food Insecurity (07/18/2023)  Housing: Low Risk  (07/18/2023)  Transportation Needs: No Transportation Needs (07/18/2023)  Utilities: Not At Risk (07/18/2023)  Alcohol Screen: Low Risk  (12/31/2022)  Depression (PHQ2-9): Medium Risk (06/14/2023)  Financial Resource Strain: Low Risk  (12/31/2022)  Physical Activity: Insufficiently Active (12/31/2022)  Social Connections: Moderately Isolated (07/18/2023)  Stress: No Stress Concern Present  (12/31/2022)  Tobacco Use: Low Risk  (07/12/2023)  Health Literacy: Adequate Health Literacy (12/31/2022)     Readmission Risk Interventions    06/11/2023   11:24 AM  Readmission Risk Prevention Plan  Post Dischage Appt Complete  Medication Screening Complete  Transportation Screening Complete

## 2023-07-24 NOTE — Discharge Summary (Signed)
 Family Medicine Teaching [facility] Discharge Summary  Patient name: Diane Freeman Medical record number: [id number] Date of birth: 1945/04/24 Age: 78 y.o. Gender: female Date of Admission: 07/17/2023  Date of Discharge: 07/24/2023 Admitting Physician: Arlyne Bering, MD  Primary Care Provider: Sarahann Cumins, DO Consultants: None  Indication for Hospitalization: Septic shock  Brief Hospital Course:  Diane Freeman is a 78 y.o.female with a history of Afib not on AC, CHF, hx brain bleeding, HTN, recent UGIB who was admitted to the [facility] Teaching Service at [facility] for septic shock iso Ecoli bacteremia and Staph aureus UTI. Her hospital course is detailed below:  Septic Shock Staph Aureus UTI Ecoli Bacteremia  Patient arrived hemodynamically unstable with BP 95/49 and tachypneic to 26. Initially admitted to ICU for vasopressors, then transitioned to PO midodrine  and transferred to floor status when stable. Initial UA concerning for UTI with urine culture demonstrating pan-sensitive staph aureus. Blood culture with largely pan-sensitive E coli. Patient initially started on broad spectrum abx, narrowed to CTX alone (4/21 - 4/23) for total 7 day course.   RLE DVT Thrombocytopenia  Acute R leg DVT on duplex. Started on heparin  gtt, transitioned to Eliquis  10 mg BID for 6 days, finally transitioned to Eliquis  5 mg BID on 4/26. Vascular was consulted who recommended no acute intervention given asymptomatic, patient will follow with [facility]VS outpatient.   Anemia  Was hospitalized for UGIB in March 2025, underwent EGD at that time which demonstrated LA Grade D reflux esophagitis, no bleeding, negative for H pylori. Patient started on PPI and sucralfate . Hgb remained stable throughout admission.   Afib w RVR Known Afib not on AC due to recent GI bleed. Entered afib with RVR during admission, thought to be secondary to infection. Recent echo (March 2025) with EF 60-65 and G1DD.  Patient started on heparin  gtt during admission 2/2 DVT as above, transitioned to Eliquis . Patient will follow up with Cardiology outpatient.   AKI Cr elevated to 1.87 on arrival, likely prerenal/due to sepsis. Baseline around 0.8-1.1. Cr improved throughout admission with return to baseline prior to discharge.  Other chronic conditions were medically managed with home medications and formulary alternatives as necessary (HLD, HTN)  PCP Follow-up Recommendations: Follow-up with vascular in the outpatient setting Patient with hypophosphatemia on admission; consider follow-up as indicated Repeat CMP to follow up K   Discharge Diagnoses/Problem List:  Principal Problem:   Septic shock (HCC), resolved  E. coli Bacteremia Active Problems:   Paroxysmal atrial fibrillation with RVR (HCC)   AKI (acute kidney injury) (HCC)   Normocytic anemia   Chronic health problem   Esophagitis determined by endoscopy   Femoral deep venous thrombosis (DVT) (HCC)   Sepsis due to Escherichia coli with encephalopathy and septic shock (HCC)   Malnutrition of moderate degree (HCC)   Staph aureus methacillin-sensitive UTI  Disposition: SNF  Discharge Condition: Stable  Discharge Exam: General: Well-appearing, sitting up in bed, NAD CV: RRR, no murmurs Respiratory: CTA anteriorly, normal work of breathing on room air Abdomen: Normoactive bowel sounds, soft, nontender, nondistended Extremities: Moves all equally  Significant Procedures: None  Significant Labs and Imaging:  Recent Labs  Lab 07/23/23 0426 07/24/23 0628  WBC 5.0 5.0  HGB 9.4* 9.2*  HCT 29.9* 28.1*  PLT 79* 97*   Recent Labs  Lab 07/23/23 0426 07/24/23 0628  NA 134* 133*  K 3.5 3.4*  CL 105 104  CO2 22 20*  GLUCOSE 92 89  BUN 12 11  CREATININE 1.08* 0.97  CALCIUM  7.5* 7.3*  MG 1.9  --   PHOS 2.4* 2.5    4/16 CXR: Low lung volumes.  Left lower lobe airspace opacity concerning for pneumonia.  4/16 RUQ US  abdomen: No  acute findings.  Hepatic steatosis.  4/16 CXR: IMPRESSION: 1. New left IJ central line terminating in the right atrium just above the inferior cavoatrial junction. No measurable pneumothorax. 2. Patchy airspace disease in the left lower lobe most likely due to pneumonia or aspiration. 3. Clover shaped density superimposing in the lateral right mid lung, more cephalad in position than on the earlier study from today. This is probably an overlying artifact as no similar abnormality was seen on CTA chest, abdomen and pelvis from 12/03/2022. 4. Aortic atherosclerosis and uncoiling. 5. Mild cardiomegaly.  Results/Tests Pending at Time of Discharge: None  Discharge Medications:  Allergies as of 07/24/2023   No Known Allergies      Medication List     TAKE these medications    apixaban  5 MG Tabs tablet Commonly known as: ELIQUIS  Take 2 tablets (10 mg total) by mouth 2 (two) times daily for 2 days, THEN 1 tablet (5 mg total) 2 (two) times daily. Start taking on: July 24, 2023   feeding supplement Liqd Take 237 mLs by mouth 2 (two) times daily between meals.   mesalamine  0.375 g 24 hr capsule Commonly known as: APRISO  Take 4 capsules (1.5 g total) by mouth daily. Please keep your appointment in July for any further refills. Thank you What changed: additional instructions   pantoprazole  40 MG tablet Commonly known as: PROTONIX  TAKE 1 TABLET BY MOUTH TWICE A DAY What changed: when to take this        Discharge Instructions: Please refer to Patient Instructions section of EMR for full details.  Patient was counseled important signs and symptoms that should prompt return to medical care, changes in medications, dietary instructions, activity restrictions, and follow up appointments.   Follow-Up Appointments:  Contact information for after-discharge care     Destination     [facility]erred [facility] .   Service: Skilled  Nursing Contact information: [address] [place]  [address] [phone]                     Omar Bibber, DO 07/24/2023, 10:18 AM PGY-1, [facility]

## 2023-07-24 NOTE — Progress Notes (Signed)
 AVS packet completed for transport

## 2023-07-24 NOTE — Progress Notes (Signed)
 Mobility Specialist: Progress Note   07/24/23 1208  Mobility  Activity Ambulated with assistance in room  Level of Assistance Standby assist, set-up cues, supervision of patient - no hands on  Assistive Device Front wheel walker  Distance Ambulated (ft) 10 ft  Activity Response Tolerated well  Mobility Referral Yes  Mobility visit 1 Mobility  Mobility Specialist Start Time (ACUTE ONLY) 1116  Mobility Specialist Stop Time (ACUTE ONLY) 1129  Mobility Specialist Time Calculation (min) (ACUTE ONLY) 13 min    Pt requesting to return back to bed - received in chair. Incontinent of bowels in chair, stood w/ SV during pericare assist from MS. Ambulated around the bed to the L side. Feeling very fatigued at EOS. Left in supine with all needs met, call bell in reach.   Deloria Fetch Mobility Specialist Please contact via SecureChat or Rehab office at [phone]

## 2023-07-25 DIAGNOSIS — D649 Anemia, unspecified: Secondary | ICD-10-CM | POA: Diagnosis not present

## 2023-07-25 DIAGNOSIS — I82401 Acute embolism and thrombosis of unspecified deep veins of right lower extremity: Secondary | ICD-10-CM | POA: Diagnosis not present

## 2023-07-25 DIAGNOSIS — I4891 Unspecified atrial fibrillation: Secondary | ICD-10-CM | POA: Diagnosis not present

## 2023-07-25 DIAGNOSIS — R5381 Other malaise: Secondary | ICD-10-CM | POA: Diagnosis not present

## 2023-07-26 ENCOUNTER — Other Ambulatory Visit: Payer: Self-pay

## 2023-07-26 ENCOUNTER — Emergency Department (HOSPITAL_COMMUNITY)

## 2023-07-26 ENCOUNTER — Encounter (HOSPITAL_COMMUNITY): Payer: Self-pay | Admitting: *Deleted

## 2023-07-26 ENCOUNTER — Emergency Department (HOSPITAL_COMMUNITY)
Admission: EM | Admit: 2023-07-26 | Discharge: 2023-07-26 | Disposition: A | Discharge status: Home / Self Care | Attending: Emergency Medicine | Admitting: Emergency Medicine

## 2023-07-26 DIAGNOSIS — F0390 Unspecified dementia without behavioral disturbance: Secondary | ICD-10-CM | POA: Diagnosis not present

## 2023-07-26 DIAGNOSIS — R9389 Abnormal findings on diagnostic imaging of other specified body structures: Secondary | ICD-10-CM | POA: Diagnosis not present

## 2023-07-26 DIAGNOSIS — R600 Localized edema: Secondary | ICD-10-CM | POA: Insufficient documentation

## 2023-07-26 DIAGNOSIS — Z7901 Long term (current) use of anticoagulants: Secondary | ICD-10-CM | POA: Insufficient documentation

## 2023-07-26 DIAGNOSIS — J9811 Atelectasis: Secondary | ICD-10-CM | POA: Diagnosis not present

## 2023-07-26 DIAGNOSIS — R0600 Dyspnea, unspecified: Secondary | ICD-10-CM | POA: Diagnosis not present

## 2023-07-26 DIAGNOSIS — I4891 Unspecified atrial fibrillation: Secondary | ICD-10-CM | POA: Diagnosis not present

## 2023-07-26 DIAGNOSIS — R0602 Shortness of breath: Secondary | ICD-10-CM | POA: Insufficient documentation

## 2023-07-26 DIAGNOSIS — R0989 Other specified symptoms and signs involving the circulatory and respiratory systems: Secondary | ICD-10-CM | POA: Diagnosis not present

## 2023-07-26 DIAGNOSIS — M7989 Other specified soft tissue disorders: Secondary | ICD-10-CM | POA: Diagnosis present

## 2023-07-26 LAB — BASIC METABOLIC PANEL WITH GFR
Anion gap: 6 (ref 5–15)
BUN: 10 mg/dL (ref 8–23)
CO2: 25 mmol/L (ref 22–32)
Calcium: 7.6 mg/dL — ABNORMAL LOW (ref 8.9–10.3)
Chloride: 104 mmol/L (ref 98–111)
Creatinine, Ser: 0.97 mg/dL (ref 0.44–1.00)
GFR, Estimated: 59 mL/min — ABNORMAL LOW (ref >60)
Glucose, Bld: 92 mg/dL (ref 70–99)
Potassium: 3.6 mmol/L (ref 3.5–5.1)
Sodium: 135 mmol/L (ref 135–145)

## 2023-07-26 LAB — TROPONIN I (HIGH SENSITIVITY)
Troponin I (High Sensitivity): 4 ng/L (ref <18)
Troponin I (High Sensitivity): 5 ng/L (ref <18)

## 2023-07-26 LAB — BRAIN NATRIURETIC PEPTIDE: B Natriuretic Peptide: 96.8 pg/mL (ref 0.0–100.0)

## 2023-07-26 LAB — CBC
HCT: 31.4 % — ABNORMAL LOW (ref 36.0–46.0)
Hemoglobin: 9.8 g/dL — ABNORMAL LOW (ref 12.0–15.0)
MCH: 26.8 pg (ref 26.0–34.0)
MCHC: 31.2 g/dL (ref 30.0–36.0)
MCV: 86 fL (ref 80.0–100.0)
Platelets: 133 10*3/uL — ABNORMAL LOW (ref 150–400)
RBC: 3.65 MIL/uL — ABNORMAL LOW (ref 3.87–5.11)
RDW: 17.2 % — ABNORMAL HIGH (ref 11.5–15.5)
WBC: 5.5 10*3/uL (ref 4.0–10.5)
nRBC: 0 % (ref 0.0–0.2)

## 2023-07-26 LAB — MAGNESIUM: Magnesium: 1.7 mg/dL (ref 1.7–2.4)

## 2023-07-26 NOTE — ED Notes (Signed)
 Update given to pt sister. This RN called snf but no answer

## 2023-07-26 NOTE — ED Provider Notes (Signed)
 [place] EMERGENCY DEPARTMENT AT [facility] Provider Note   CSN: [id number] Arrival date & time: 07/26/23  4098     History  Chief Complaint  Patient presents with   Shortness of Breath    Diane Freeman is a 78 y.o. female.   Shortness of Breath    Patient is a resident at a nursing facility.  She presented to the ED for evaluation of shortness of breath.  Per the EMS report staff at the facility also noted she had increasing leg swelling.  Patient has history of dementia.  She is alert and awake and provides a history but is not able to clearly tell me why they sent her to the emergency room.  She denies any chest pain now.  She has not noticed any leg swelling.  She denies any fevers chills or coughing.  She states she feels fine now.  Home Medications Prior to Admission medications   Medication Sig Start Date End Date Taking? Authorizing Provider  apixaban  (ELIQUIS ) 5 MG TABS tablet Take 2 tablets (10 mg total) by mouth 2 (two) times daily for 2 days, THEN 1 tablet (5 mg total) 2 (two) times daily. 07/24/23 08/25/23 Yes Mabe, Doroteo Gasmen, MD  hydrocortisone  1 % lotion Apply 1 Application topically 2 (two) times daily.   Yes [provider]  mesalamine  (APRISO ) 0.375 g 24 hr capsule Take 4 capsules (1.5 g total) by mouth daily. Please keep your appointment in July for any further refills. Thank you Patient taking differently: Take 1.5 g by mouth daily. 07/21/23  Yes Armbruster, Lendon Queen, MD  pantoprazole  (PROTONIX ) 40 MG tablet TAKE 1 TABLET BY MOUTH TWICE A DAY 07/17/23  Yes Everhart, Kirstie, DO  nystatin  cream (MYCOSTATIN ) Apply 1 Application topically 2 (two) times daily. Apply to breast and abdomen Patient not taking: Reported on 07/26/2023    [provider]  terbinafine (LAMISIL) 250 MG tablet Take 250 mg by mouth daily. Patient not taking: Reported on 07/26/2023    [provider]      Allergies    Patient has no known allergies.     Review of Systems   Review of Systems  Respiratory:  Positive for shortness of breath.     Physical Exam Updated Vital Signs BP 136/66   Pulse 90   Temp 97.9 F (36.6 C) (Oral)   Resp 18   Ht 1.6 m (5\' 3" )   Wt 71.9 kg   SpO2 93%   BMI 28.08 kg/m  Physical Exam Vitals and nursing note reviewed.  Constitutional:      Appearance: She is well-developed. She is not ill-appearing or diaphoretic.  HENT:     Head: Normocephalic and atraumatic.     Right Ear: External ear normal.     Left Ear: External ear normal.  Eyes:     General: No scleral icterus.       Right eye: No discharge.        Left eye: No discharge.     Conjunctiva/sclera: Conjunctivae normal.  Neck:     Trachea: No tracheal deviation.  Cardiovascular:     Rate and Rhythm: Normal rate and regular rhythm.  Pulmonary:     Effort: Pulmonary effort is normal. No respiratory distress.     Breath sounds: Normal breath sounds. No stridor. No wheezing or rales.  Abdominal:     General: Bowel sounds are normal. There is no distension.     Palpations: Abdomen is soft.  Tenderness: There is no abdominal tenderness. There is no guarding or rebound.  Musculoskeletal:        General: No tenderness or deformity.     Cervical back: Neck supple.     Right lower leg: Edema present.     Left lower leg: Edema present.  Skin:    General: Skin is warm and dry.     Findings: No rash.  Neurological:     General: No focal deficit present.     Mental Status: She is alert.     Cranial Nerves: No cranial nerve deficit, dysarthria or facial asymmetry.     Sensory: No sensory deficit.     Motor: No abnormal muscle tone or seizure activity.     Coordination: Coordination normal.  Psychiatric:        Mood and Affect: Mood normal.     ED Results / Procedures / Treatments   Labs (all labs ordered are listed, but only abnormal results are displayed) Labs Reviewed  BASIC METABOLIC PANEL WITH GFR - Abnormal; Notable for the  following components:      Result Value   Calcium  7.6 (*)    GFR, Estimated 59 (*)    All other components within normal limits  CBC - Abnormal; Notable for the following components:   RBC 3.65 (*)    Hemoglobin 9.8 (*)    HCT 31.4 (*)    RDW 17.2 (*)    Platelets 133 (*)    All other components within normal limits  MAGNESIUM   BRAIN NATRIURETIC PEPTIDE  TROPONIN I (HIGH SENSITIVITY)  TROPONIN I (HIGH SENSITIVITY)    EKG EKG Interpretation Date/Time:  Friday July 26 2023 10:51:09 EDT Ventricular Rate:  78 PR Interval:  212 QRS Duration:  76 QT Interval:  428 QTC Calculation: 487 R Axis:   -16  Text Interpretation: Atrial fibrillation Possible Lateral infarct , age undetermined Abnormal ECG When compared with ECG of 17-Jul-2023 16:01, No significant change since last tracing Confirmed by Trish Furl [phone]) on 07/26/2023 10:54:48 AM  Radiology No results found.  Procedures Procedures    Medications Ordered in ED Medications - No data to display  ED Course/ Medical Decision Making/ A&P Clinical Course as of 07/28/23 1603  Fri Jul 26, 2023  1143 Vital signs reviewed.  Patient is on oxygen at this time otherwise vitals are stable [JK]  1245 Brain natriuretic peptide (order ONLY if patient c/o SOB) BNP normal [JK]  1245 CBC(!) [JK]  1245 CBC(!) Hemoglobin stable compared to previous [JK]  1245 Magnesium  [JK]  1245 Troponin I (High Sensitivity) Troponin normal.  Magnesium  normal [JK]  1245 Chest x-ray shows low lung volumes with streaky atelectasis [JK]  1344 Patient does not have an oxygen requirement at this time [JK]  1455 Serial troponins normal. [JK]    Clinical Course User Index [JK] Trish Furl, MD                                 Medical Decision Making Amount and/or Complexity of Data Reviewed Labs: ordered. Decision-making details documented in ED Course. Radiology: ordered.  Pt presented to the ED with complaints of edema, possible dyspnea.  Pt  denied sx at presentation to ED.  Initially placed on o2 but taken off while in the ED.  Maintained stable o2 saturations, no need for supplemental 02  PT with peripheral edema but wearing compression stockings.  Suspect chronic.  No wheezing crackes, on exam.  No signs of pna.  Doubt CHF, copd exacerbation, acs.  Labs and xray reassuring.  Stable for discharge, outpt follow up        Final Clinical Impression(s) / ED Diagnoses Final diagnoses:  Peripheral edema    Rx / DC Orders ED Discharge Orders     None         Trish Furl, MD 07/28/23 1605

## 2023-07-26 NOTE — Discharge Instructions (Signed)
 The test today did not show any signs of heart failure or pneumonia.  Your oxygen level was normal here.  Continue your current medications and follow-up with your doctor to be rechecked

## 2023-07-26 NOTE — ED Triage Notes (Signed)
 Patient presents to ED via [facility] from [facility] , c/o sob onset yest with bilateral lower ext. Edema. Per ems patient had hx. Of dementia and is at her baseline.

## 2023-07-26 NOTE — ED Notes (Signed)
 Recalled [facility] stated they came had wrong person listed & couldn't find pt- made correction with pt correct contact name & information not ETA

## 2023-07-29 ENCOUNTER — Telehealth: Payer: Self-pay | Admitting: Gastroenterology

## 2023-07-29 DIAGNOSIS — R5381 Other malaise: Secondary | ICD-10-CM | POA: Diagnosis not present

## 2023-07-29 DIAGNOSIS — N390 Urinary tract infection, site not specified: Secondary | ICD-10-CM | POA: Diagnosis not present

## 2023-07-29 DIAGNOSIS — I82401 Acute embolism and thrombosis of unspecified deep veins of right lower extremity: Secondary | ICD-10-CM | POA: Diagnosis not present

## 2023-07-29 DIAGNOSIS — B354 Tinea corporis: Secondary | ICD-10-CM | POA: Diagnosis not present

## 2023-07-29 NOTE — Telephone Encounter (Signed)
Okay got it, thanks

## 2023-07-29 NOTE — Telephone Encounter (Signed)
 Patient currently in rehab following in-patient care for sepsis.  Rescheduled follow-up EGD 09/20/23

## 2023-07-29 NOTE — Telephone Encounter (Signed)
 Okay, thanks for letting me know.  Can you clarify when her initial EGD was actually scheduled for, was it for this week?

## 2023-07-29 NOTE — Telephone Encounter (Signed)
 Originally scheduled 5/05.

## 2023-07-29 NOTE — Telephone Encounter (Signed)
 Patient care partner requesting a call back on [phone] to discuss upcoming procedure.

## 2023-07-31 ENCOUNTER — Inpatient Hospital Stay (HOSPITAL_COMMUNITY)
Admission: EM | Admit: 2023-07-31 | Discharge: 2023-08-07 | DRG: 176 | Disposition: A | Discharge status: Skilled Nursing Facility | Source: Skilled Nursing Facility | Attending: Family Medicine | Admitting: Family Medicine

## 2023-07-31 ENCOUNTER — Inpatient Hospital Stay (HOSPITAL_COMMUNITY)

## 2023-07-31 ENCOUNTER — Emergency Department (HOSPITAL_COMMUNITY)

## 2023-07-31 ENCOUNTER — Other Ambulatory Visit: Payer: Self-pay

## 2023-07-31 DIAGNOSIS — L304 Erythema intertrigo: Secondary | ICD-10-CM | POA: Diagnosis present

## 2023-07-31 DIAGNOSIS — E876 Hypokalemia: Secondary | ICD-10-CM | POA: Diagnosis not present

## 2023-07-31 DIAGNOSIS — K449 Diaphragmatic hernia without obstruction or gangrene: Secondary | ICD-10-CM | POA: Diagnosis present

## 2023-07-31 DIAGNOSIS — I5032 Chronic diastolic (congestive) heart failure: Secondary | ICD-10-CM | POA: Diagnosis present

## 2023-07-31 DIAGNOSIS — Z79899 Other long term (current) drug therapy: Secondary | ICD-10-CM

## 2023-07-31 DIAGNOSIS — Z803 Family history of malignant neoplasm of breast: Secondary | ICD-10-CM | POA: Diagnosis not present

## 2023-07-31 DIAGNOSIS — R161 Splenomegaly, not elsewhere classified: Secondary | ICD-10-CM | POA: Diagnosis not present

## 2023-07-31 DIAGNOSIS — I2699 Other pulmonary embolism without acute cor pulmonale: Principal | ICD-10-CM

## 2023-07-31 DIAGNOSIS — F0390 Unspecified dementia without behavioral disturbance: Secondary | ICD-10-CM | POA: Diagnosis present

## 2023-07-31 DIAGNOSIS — D696 Thrombocytopenia, unspecified: Secondary | ICD-10-CM | POA: Diagnosis present

## 2023-07-31 DIAGNOSIS — R6521 Severe sepsis with septic shock: Secondary | ICD-10-CM | POA: Diagnosis not present

## 2023-07-31 DIAGNOSIS — K209 Esophagitis, unspecified without bleeding: Secondary | ICD-10-CM | POA: Diagnosis not present

## 2023-07-31 DIAGNOSIS — E785 Hyperlipidemia, unspecified: Secondary | ICD-10-CM | POA: Diagnosis present

## 2023-07-31 DIAGNOSIS — Z7901 Long term (current) use of anticoagulants: Secondary | ICD-10-CM | POA: Diagnosis not present

## 2023-07-31 DIAGNOSIS — K838 Other specified diseases of biliary tract: Secondary | ICD-10-CM | POA: Diagnosis not present

## 2023-07-31 DIAGNOSIS — Z7189 Other specified counseling: Secondary | ICD-10-CM | POA: Diagnosis not present

## 2023-07-31 DIAGNOSIS — Z8719 Personal history of other diseases of the digestive system: Secondary | ICD-10-CM

## 2023-07-31 DIAGNOSIS — I5030 Unspecified diastolic (congestive) heart failure: Secondary | ICD-10-CM | POA: Diagnosis not present

## 2023-07-31 DIAGNOSIS — Z8673 Personal history of transient ischemic attack (TIA), and cerebral infarction without residual deficits: Secondary | ICD-10-CM

## 2023-07-31 DIAGNOSIS — Z86718 Personal history of other venous thrombosis and embolism: Secondary | ICD-10-CM | POA: Diagnosis not present

## 2023-07-31 DIAGNOSIS — I82461 Acute embolism and thrombosis of right calf muscular vein: Secondary | ICD-10-CM | POA: Diagnosis present

## 2023-07-31 DIAGNOSIS — N281 Cyst of kidney, acquired: Secondary | ICD-10-CM | POA: Diagnosis not present

## 2023-07-31 DIAGNOSIS — D649 Anemia, unspecified: Secondary | ICD-10-CM | POA: Diagnosis present

## 2023-07-31 DIAGNOSIS — Z800 Family history of malignant neoplasm of digestive organs: Secondary | ICD-10-CM

## 2023-07-31 DIAGNOSIS — J9811 Atelectasis: Secondary | ICD-10-CM | POA: Diagnosis not present

## 2023-07-31 DIAGNOSIS — K2100 Gastro-esophageal reflux disease with esophagitis, without bleeding: Secondary | ICD-10-CM | POA: Diagnosis not present

## 2023-07-31 DIAGNOSIS — I5033 Acute on chronic diastolic (congestive) heart failure: Secondary | ICD-10-CM

## 2023-07-31 DIAGNOSIS — I2694 Multiple subsegmental pulmonary emboli without acute cor pulmonale: Secondary | ICD-10-CM

## 2023-07-31 DIAGNOSIS — J439 Emphysema, unspecified: Secondary | ICD-10-CM | POA: Diagnosis not present

## 2023-07-31 DIAGNOSIS — K8689 Other specified diseases of pancreas: Secondary | ICD-10-CM | POA: Diagnosis not present

## 2023-07-31 DIAGNOSIS — K8050 Calculus of bile duct without cholangitis or cholecystitis without obstruction: Secondary | ICD-10-CM | POA: Diagnosis not present

## 2023-07-31 DIAGNOSIS — K760 Fatty (change of) liver, not elsewhere classified: Secondary | ICD-10-CM | POA: Diagnosis present

## 2023-07-31 DIAGNOSIS — Z789 Other specified health status: Secondary | ICD-10-CM

## 2023-07-31 DIAGNOSIS — K5090 Crohn's disease, unspecified, without complications: Secondary | ICD-10-CM | POA: Diagnosis not present

## 2023-07-31 DIAGNOSIS — R0609 Other forms of dyspnea: Secondary | ICD-10-CM

## 2023-07-31 DIAGNOSIS — R188 Other ascites: Secondary | ICD-10-CM

## 2023-07-31 DIAGNOSIS — K219 Gastro-esophageal reflux disease without esophagitis: Secondary | ICD-10-CM | POA: Diagnosis not present

## 2023-07-31 DIAGNOSIS — R0789 Other chest pain: Secondary | ICD-10-CM | POA: Diagnosis not present

## 2023-07-31 DIAGNOSIS — I110 Hypertensive heart disease with heart failure: Secondary | ICD-10-CM | POA: Diagnosis present

## 2023-07-31 DIAGNOSIS — Z7401 Bed confinement status: Secondary | ICD-10-CM | POA: Diagnosis not present

## 2023-07-31 DIAGNOSIS — J90 Pleural effusion, not elsewhere classified: Secondary | ICD-10-CM | POA: Diagnosis not present

## 2023-07-31 DIAGNOSIS — I480 Paroxysmal atrial fibrillation: Secondary | ICD-10-CM | POA: Diagnosis present

## 2023-07-31 DIAGNOSIS — R932 Abnormal findings on diagnostic imaging of liver and biliary tract: Secondary | ICD-10-CM | POA: Diagnosis not present

## 2023-07-31 DIAGNOSIS — K5730 Diverticulosis of large intestine without perforation or abscess without bleeding: Secondary | ICD-10-CM | POA: Diagnosis not present

## 2023-07-31 DIAGNOSIS — Z9049 Acquired absence of other specified parts of digestive tract: Secondary | ICD-10-CM

## 2023-07-31 DIAGNOSIS — N179 Acute kidney failure, unspecified: Secondary | ICD-10-CM | POA: Diagnosis not present

## 2023-07-31 DIAGNOSIS — Z87898 Personal history of other specified conditions: Secondary | ICD-10-CM

## 2023-07-31 DIAGNOSIS — E440 Moderate protein-calorie malnutrition: Secondary | ICD-10-CM | POA: Diagnosis not present

## 2023-07-31 DIAGNOSIS — Z66 Do not resuscitate: Secondary | ICD-10-CM | POA: Diagnosis present

## 2023-07-31 DIAGNOSIS — R531 Weakness: Secondary | ICD-10-CM | POA: Diagnosis not present

## 2023-07-31 DIAGNOSIS — K5010 Crohn's disease of large intestine without complications: Secondary | ICD-10-CM | POA: Diagnosis not present

## 2023-07-31 DIAGNOSIS — I878 Other specified disorders of veins: Secondary | ICD-10-CM | POA: Diagnosis present

## 2023-07-31 DIAGNOSIS — R079 Chest pain, unspecified: Secondary | ICD-10-CM | POA: Diagnosis not present

## 2023-07-31 DIAGNOSIS — G9341 Metabolic encephalopathy: Secondary | ICD-10-CM | POA: Diagnosis not present

## 2023-07-31 DIAGNOSIS — R918 Other nonspecific abnormal finding of lung field: Secondary | ICD-10-CM | POA: Diagnosis not present

## 2023-07-31 HISTORY — PX: IR PARACENTESIS: IMG2679

## 2023-07-31 LAB — CBC
HCT: 27.8 % — ABNORMAL LOW (ref 36.0–46.0)
HCT: 29.7 % — ABNORMAL LOW (ref 36.0–46.0)
Hemoglobin: 8.6 g/dL — ABNORMAL LOW (ref 12.0–15.0)
Hemoglobin: 9 g/dL — ABNORMAL LOW (ref 12.0–15.0)
MCH: 26.4 pg (ref 26.0–34.0)
MCH: 26.5 pg (ref 26.0–34.0)
MCHC: 30.3 g/dL (ref 30.0–36.0)
MCHC: 30.9 g/dL (ref 30.0–36.0)
MCV: 85.8 fL (ref 80.0–100.0)
MCV: 87.1 fL (ref 80.0–100.0)
Platelets: 170 10*3/uL (ref 150–400)
Platelets: 184 10*3/uL (ref 150–400)
RBC: 3.24 MIL/uL — ABNORMAL LOW (ref 3.87–5.11)
RBC: 3.41 MIL/uL — ABNORMAL LOW (ref 3.87–5.11)
RDW: 17.2 % — ABNORMAL HIGH (ref 11.5–15.5)
RDW: 17.4 % — ABNORMAL HIGH (ref 11.5–15.5)
WBC: 5.1 10*3/uL (ref 4.0–10.5)
WBC: 5.3 10*3/uL (ref 4.0–10.5)
nRBC: 0 % (ref 0.0–0.2)
nRBC: 0 % (ref 0.0–0.2)

## 2023-07-31 LAB — TROPONIN I (HIGH SENSITIVITY)
Troponin I (High Sensitivity): 4 ng/L (ref <18)
Troponin I (High Sensitivity): 6 ng/L (ref <18)

## 2023-07-31 LAB — URINALYSIS, ROUTINE W REFLEX MICROSCOPIC
Bilirubin Urine: NEGATIVE
Glucose, UA: NEGATIVE mg/dL
Hgb urine dipstick: NEGATIVE
Ketones, ur: NEGATIVE mg/dL
Leukocytes,Ua: NEGATIVE
Nitrite: NEGATIVE
Protein, ur: NEGATIVE mg/dL
Specific Gravity, Urine: 1.028 (ref 1.005–1.030)
pH: 5 (ref 5.0–8.0)

## 2023-07-31 LAB — COMPREHENSIVE METABOLIC PANEL WITH GFR
ALT: 12 U/L (ref 0–44)
ALT: 14 U/L (ref 0–44)
AST: 25 U/L (ref 15–41)
AST: 25 U/L (ref 15–41)
Albumin: 1.8 g/dL — ABNORMAL LOW (ref 3.5–5.0)
Albumin: 1.7 g/dL — ABNORMAL LOW (ref 3.5–5.0)
Alkaline Phosphatase: 73 U/L (ref 38–126)
Alkaline Phosphatase: 74 U/L (ref 38–126)
Anion gap: 7 (ref 5–15)
Anion gap: 9 (ref 5–15)
BUN: 13 mg/dL (ref 8–23)
BUN: 12 mg/dL (ref 8–23)
CO2: 23 mmol/L (ref 22–32)
CO2: 21 mmol/L — ABNORMAL LOW (ref 22–32)
Calcium: 7.8 mg/dL — ABNORMAL LOW (ref 8.9–10.3)
Calcium: 7.7 mg/dL — ABNORMAL LOW (ref 8.9–10.3)
Chloride: 108 mmol/L (ref 98–111)
Chloride: 107 mmol/L (ref 98–111)
Creatinine, Ser: 0.99 mg/dL (ref 0.44–1.00)
Creatinine, Ser: 0.99 mg/dL (ref 0.44–1.00)
GFR, Estimated: 58 mL/min — ABNORMAL LOW (ref >60)
GFR, Estimated: 58 mL/min — ABNORMAL LOW (ref >60)
Glucose, Bld: 97 mg/dL (ref 70–99)
Glucose, Bld: 90 mg/dL (ref 70–99)
Potassium: 3.2 mmol/L — ABNORMAL LOW (ref 3.5–5.1)
Potassium: 3.4 mmol/L — ABNORMAL LOW (ref 3.5–5.1)
Sodium: 138 mmol/L (ref 135–145)
Sodium: 137 mmol/L (ref 135–145)
Total Bilirubin: 0.7 mg/dL (ref 0.0–1.2)
Total Bilirubin: 0.8 mg/dL (ref 0.0–1.2)
Total Protein: 5.9 g/dL — ABNORMAL LOW (ref 6.5–8.1)
Total Protein: 5.9 g/dL — ABNORMAL LOW (ref 6.5–8.1)

## 2023-07-31 LAB — BRAIN NATRIURETIC PEPTIDE: B Natriuretic Peptide: 121 pg/mL — ABNORMAL HIGH (ref 0.0–100.0)

## 2023-07-31 LAB — BODY FLUID CELL COUNT WITH DIFFERENTIAL
Eos, Fluid: 0 %
Lymphs, Fluid: 35 %
Monocyte-Macrophage-Serous Fluid: 22 % — ABNORMAL LOW (ref 50–90)
Neutrophil Count, Fluid: 43 % — ABNORMAL HIGH (ref 0–25)
Total Nucleated Cell Count, Fluid: 138 uL (ref 0–1000)

## 2023-07-31 LAB — LIPASE, BLOOD: Lipase: 50 U/L (ref 11–51)

## 2023-07-31 LAB — ALBUMIN, PLEURAL OR PERITONEAL FLUID: Albumin, Fluid: <1.5 g/dL

## 2023-07-31 LAB — ECHOCARDIOGRAM COMPLETE
Area-P 1/2: 3.21 cm2
Height: 63 in
S' Lateral: 3.1 cm
Weight: 2536.17 [oz_av]

## 2023-07-31 LAB — PROTEIN, PLEURAL OR PERITONEAL FLUID: Total protein, fluid: <3 g/dL

## 2023-07-31 LAB — APTT: aPTT: 86 s — ABNORMAL HIGH (ref 24–36)

## 2023-07-31 LAB — HEPARIN LEVEL (UNFRACTIONATED): Heparin Unfractionated: >1.1 [IU]/mL — ABNORMAL HIGH (ref 0.30–0.70)

## 2023-07-31 MED ORDER — GADOBUTROL 1 MMOL/ML IV SOLN
7.0000 mL | Freq: Once | INTRAVENOUS | Status: AC | PRN
  Administered 2023-07-31: 7 mL via INTRAVENOUS

## 2023-07-31 MED ORDER — FUROSEMIDE 10 MG/ML IJ SOLN
20.0000 mg | Freq: Once | INTRAMUSCULAR | Status: AC
  Administered 2023-07-31: 20 mg via INTRAVENOUS
  Filled 2023-07-31: qty 2

## 2023-07-31 MED ORDER — PANTOPRAZOLE SODIUM 40 MG PO TBEC
40.0000 mg | DELAYED_RELEASE_TABLET | Freq: Two times a day (BID) | ORAL | Status: DC
  Administered 2023-07-31 – 2023-08-07 (×15): 40 mg via ORAL
  Filled 2023-07-31 (×6): qty 1
  Filled 2023-07-31: qty 2
  Filled 2023-07-31 (×8): qty 1

## 2023-07-31 MED ORDER — IOHEXOL 350 MG/ML SOLN
75.0000 mL | Freq: Once | INTRAVENOUS | Status: AC | PRN
  Administered 2023-07-31: 75 mL via INTRAVENOUS

## 2023-07-31 MED ORDER — LIDOCAINE HCL 1 % IJ SOLN
20.0000 mL | Freq: Once | INTRAMUSCULAR | Status: AC
  Administered 2023-07-31: 10 mL

## 2023-07-31 MED ORDER — ALBUMIN HUMAN 25 % IV SOLN
50.0000 g | Freq: Four times a day (QID) | INTRAVENOUS | Status: AC
  Administered 2023-07-31: 50 g via INTRAVENOUS
  Filled 2023-07-31: qty 200

## 2023-07-31 MED ORDER — FENTANYL CITRATE PF 50 MCG/ML IJ SOSY
50.0000 ug | PREFILLED_SYRINGE | Freq: Once | INTRAMUSCULAR | Status: AC
  Administered 2023-07-31: 50 ug via INTRAVENOUS
  Filled 2023-07-31: qty 1

## 2023-07-31 MED ORDER — LIDOCAINE HCL 1 % IJ SOLN
INTRAMUSCULAR | Status: AC
Start: 2023-07-31 — End: ?
  Filled 2023-07-31: qty 20

## 2023-07-31 MED ORDER — NYSTATIN 100000 UNIT/GM EX CREA
TOPICAL_CREAM | Freq: Two times a day (BID) | CUTANEOUS | Status: DC
  Filled 2023-07-31: qty 30

## 2023-07-31 MED ORDER — MESALAMINE 1.2 G PO TBEC
1.2000 g | DELAYED_RELEASE_TABLET | Freq: Every day | ORAL | Status: DC
  Administered 2023-08-01 – 2023-08-02 (×2): 1.2 g via ORAL
  Filled 2023-07-31 (×2): qty 1

## 2023-07-31 MED ORDER — MESALAMINE ER 0.375 G PO CP24: 1.5000 g | ORAL_CAPSULE | Freq: Every day | ORAL | Status: DC

## 2023-07-31 MED ORDER — POTASSIUM CHLORIDE CRYS ER 20 MEQ PO TBCR
40.0000 meq | EXTENDED_RELEASE_TABLET | Freq: Once | ORAL | Status: AC
  Administered 2023-07-31: 40 meq via ORAL
  Filled 2023-07-31: qty 2

## 2023-07-31 MED ORDER — HEPARIN (PORCINE) 25000 UT/250ML-% IV SOLN
900.0000 [IU]/h | INTRAVENOUS | Status: DC
  Administered 2023-07-31 – 2023-08-01 (×2): 1100 [IU]/h via INTRAVENOUS
  Filled 2023-07-31 (×2): qty 250

## 2023-07-31 NOTE — Assessment & Plan Note (Addendum)
 Bilateral 1-2+ pitting edema.  Bilateral crackles in bases.  Not on GDMT nor diuretics at home. Last echo in Mar 2025 with EF 60-65%, G1DD. Body wall anasarca and mesenteric congestion on CT A&P. -- Repeat echo ordered  - 20 mg IV Lasix  ordered  -- BNP ordered  -- AM CBC, CMP

## 2023-07-31 NOTE — ED Triage Notes (Signed)
 Pt BIB [facility] from [facility] d/t CP and SOB that started today. Was given NTG x2 with no relief. Presents with bilateral pitting edema.

## 2023-07-31 NOTE — Assessment & Plan Note (Addendum)
 Crohn's disease: Continue mesalamine  1500 mg daily. GI consulted appreciate recommendations. Paroxysmal atrial fibrillation: Heart rate controlled on admission. Does not take rate control agents. Holding home eliquis  in setting of heparin  drip for PE.

## 2023-07-31 NOTE — Assessment & Plan Note (Addendum)
 VSS, normal work of breathing this morning on 1 L via [place], patient denies difficulty breathing or shortness of breath.  Bilateral pulmonary emboli on CTPE, overall small clot burden. Patient on Eliquis  at home.  Doubt this is a true Eliquis  failure; patient did have a fairly large DVT and it is possible that the PE was caused by break-up of this clot. Could also have been present for some time as she did not have scan during last hospitalization. - Heparin  per pharmacy, hold home Eliquis   - O2 supplementation as needed, goal O2 >92  - Continuous cardiac monitoring - Daily APTT, heparin  level  - AM CBC, CMP

## 2023-07-31 NOTE — Assessment & Plan Note (Addendum)
 K 3.4 this AM after repletion with 40 mEq tablet. - AM CMP - Replete as indicated

## 2023-07-31 NOTE — ED Notes (Signed)
 Reached out to the provider that ordered pt MRI to make sure pt could come off heparin  drip for MRI. Provider said it could be paused for MRI.

## 2023-07-31 NOTE — Assessment & Plan Note (Addendum)
 K 3.2 on arrival. EKG similar to prior. - AM CMP - Replete as indicated - 40mEq K ordered

## 2023-07-31 NOTE — Assessment & Plan Note (Signed)
 Bilateral pulmonary emboli on CTPE, overall small clot burden. Patient on Eliquis  at home. EKG without ischemic findings. Trops negative.  - Admit to [facility] with attending Dr Alverna John - Heparin  per pharmacy, hold home Eliquis   - O2 supplementation as needed, goal O2 >92  - Continuous cardiac monitoring - Vitals per floor  - Daily APTT, heparin  level  - AM CBC, CMP

## 2023-07-31 NOTE — Assessment & Plan Note (Addendum)
 Hx of cholecystectomy. New ascites and CBD dilatation found on imaging. No cirrhosis on imaging. Normal LFTs and bilibubin on arrival. Lipase WNL. Body wall anasarca and mesenteric congestive changes on imaging.  -- GI consulted, appreciate recommendations -- probably needs ERCP, family will likely need to consent -- AM CMP

## 2023-07-31 NOTE — Assessment & Plan Note (Addendum)
 Hemoglobin 8.6 on arrival (baseline 9-10). History of GI bleed in March 2025, found to have LA Grade D reflux esophagitis.  Unfortunately, now requiring heparin  for PE.  Will monitor closely in setting of anticoagulation. -- Appreciate GI recs  -- Cont home protonix  40 BID -- AM CBC

## 2023-07-31 NOTE — Assessment & Plan Note (Signed)
 Hemoglobin 8.6 on arrival, 9.0 on recheck (baseline 9-10). History of GI bleed in March 2025, found to have LA Grade D reflux esophagitis.  Unfortunately, now requiring heparin  for PE.  Will monitor closely in setting of anticoagulation.  No evidence of active bleeding. -- Appreciate GI recs  -- Cont home protonix  40 BID -- AM CBC

## 2023-07-31 NOTE — Progress Notes (Addendum)
 PHARMACY - ANTICOAGULATION CONSULT NOTE  Pharmacy Consult for IV heparin  Indication: pulmonary embolus  No Known Allergies  Patient Measurements: Height: 5\' 3"  (160 cm) Weight: 71.9 kg (158 lb 8.2 oz) IBW/kg (Calculated) : 52.4 HEPARIN  DW (KG): 67.4  Vital Signs: Temp: 97.8 F (36.6 C) (04/30 0020) Temp Source: Oral (04/30 0020) BP: 118/97 (04/30 0230) Pulse Rate: 83 (04/30 0230)  Labs: Recent Labs    07/31/23 0034  HGB 8.6*  HCT 27.8*  PLT 184  CREATININE 0.99  TROPONINIHS 4    Estimated Creatinine Clearance: 43.8 mL/min (by C-G formula based on SCr of 0.99 mg/dL).   Medical History: Past Medical History:  Diagnosis Date   Abdominal pain 12/03/2022   Adrenal nodule (HCC)    Anemia    Blood transfusion without reported diagnosis    Cataract    bil removed   Choledocholithiasis with obstruction    Colitis    Crohn's colitis (HCC) 07/11/2012   Crohn's disease (HCC) 06/10/2013   Diastolic dysfunction    a. 05/2013 Echo: EF 60-65%, no rwma, GrI DD, mild-mod TR.   Diverticulosis    Elevated blood pressure reading 02/21/2021   ESOPHAGEAL STRICTURE 11/27/2007   Qualifier: History of   By: Nelson-Smith CMA (AAMA), [name]         Fatty liver    GERD with esophagitis 11/27/2007   EGD showed LA Grade D reflux esophagitis 06/2023        Hematemesis 06/09/2023   Hiatal hernia    History of esophageal stricture    History of left heart catheterization    a. 05/2013 Cath: Nl cors. Nl R heart filling pressures.  No L -> R shunt.  EF 65%.   Hx of gastritis    Hyperlipidemia 01/24/2018   Lower extremity edema 12/03/2022   Palpitations    a. 09/2014 Zio: Rare PACs/PVCs.   Pyelonephritis 05/29/2022   Right shoulder pain 12/03/2022   Sepsis (HCC) 12/03/2022   UTI (urinary tract infection) 05/14/2013   Assessment: Diane Freeman is a 78 y.o. year old female admitted on 07/31/2023 with concern for new PE. Recently started on eliquis  for new DVT (treatment dose) on 4/20  and has been getting doses while in facility. Pharmacy consulted to dose heparin .  Goal of Therapy:  aPTT 66-102 seconds Monitor platelets by anticoagulation protocol: Yes   Plan:  Heparin  infusion at 1100 units/hr (no bolus) 8 aPTT  Daily aPTT, heparin  level, CBC, and monitoring for bleeding F/u plans for anticoagulation   Thank you for allowing pharmacy to participate in this patient's care.  Fonda Hymen, PharmD Emergency Medicine Clinical Pharmacist 07/31/2023,2:39 AM

## 2023-07-31 NOTE — Consult Note (Addendum)
 Consultation Note   Referring Provider:  [facility] PCP: Sarahann Cumins, DO Primary Gastroenterologist::  Alvester Johnson, MD  Reason for Consultation: Choledocholithiasis DOA: 07/31/2023         Hospital Day: 1   ASSESSMENT    78 y.o. year old female with a medical history including but not limited to Crohn's disease, DVT, diastolic dysfunction, PAF  and GERD / esophageal stenosis . Admitted yesterday with PE, edema,  and abdominal pain   SHOB ( multifactorial) Anasarca Ascites, ? 2/2 to heart failure.  Hx of Grade I DD on Echo in March 2025 Ascites ? 2/2 to volume overload. No history of cirrhosis and liver non-cirrhotic on CT scan. However, she does have splenomegaly    Bilateral pulmonary emboli Started on Iv Heparin   Abdominal pain  Choledocholithiasis  Normal LFTs MRCP showing a 9 x 9 x 30 mm stone in the distal common bile duct with resultant extrahepatic bile duct dilation measuring up to 1.1 cm. Afebrile. WBC is normal. No evidence for cholangitis. Abdominal pain may be 2/2 to ascites.   Long standing Crohn's colitis.  No active disease on last colonoscopy in 2022.  Maintained on Apriso   GERD  Severe esophagitis on EGD in March 2025 Moderate to large hiatal hernia 1/4 of stomach and ascitic fluid tracking up from abdomen on imaging. Was or is scheduled to have follow EGD soon  AFIB On Eliquis  at home though she says she doesn't take ANY medications.   Chronic [place] anemia No iron deficient on recent iron studies  Chronic thrombocytopenia Mild splenomegaly  See PMH for additional history  Principal Problem:   Pulmonary emboli (HCC) Active Problems:   Hypokalemia   Diastolic CHF (HCC)   Normocytic anemia   Chronic health problem   Dilated bile duct  Ascites     PLAN:   --Continue Pantoprazole  40 mg PO BID --Anti-reflux precautions  --Echo pending --Will need diuresis at some point, renal  function normal.  --She will likely need ERCP when more medically stable. When the time comes we may have to consent family (she has some confusion). --May need diagnostic tap of ascitic fluid at some point.  --At time of ERCP we can reevaluate the severe esophagitis found in March --Continue home Apriso   Addendum:  Ordered LVP with IV albumin. Ascites possibly 2/2 to heart failure but need to be sure since not liver related since she has splenomegaly. Given her complaints of abdominal pain need to evaluate for SBP. She is getting IV heparin  so ? Whether paracentesis can be done  HPI   We saw Diane Freeman during her March admission for hematemesis.  She underwent EGD findings of severe reflux esophagitis and a 9 cm hiatal hernia.  Treated with twice daily PPI.  She is a release was scheduled to have a follow-up EGD 7. She was admitted [facility] from [facility] for chest pain and SHOB. She describes generalized lower abdominal pain over the last 1-2 weeks. Pain intermittent, unrelated to eating or BMs. Has formed stool without blood. Says she doesn't take anything for her Crohn's disease but Apriso  on home med list  Workup thus far:  Lipase, LFT normal except for albumin of 1.8. Troponin normal.  CT AP showing interval  development of CBD dilation with possible density along CBD, moderate ascites. MRCP demonstrates a 9 x 9 x 30 mm stone in the distal common bile duct with resultant extrahepatic bile duct dilation measuring up to 1.1 cm. No intrahepatic bile duct dilation. Surgically absent Gallbladder. There is moderate to large ascites. Mild splenomegaly.   Previous GI Studies   Most recent endoscopic evaluation  06/10/2023 EGD for hematemesis - LA Grade D reflux esophagitis with no bleeding. Biopsied with resultant excessive bleeding from biopsy site requiring hemostatic gel application. Hemostasis achieved. - 9 cm hiatal hernia. - Erythematous mucosa in the gastric body. Biopsied. - Normal examined  duodenum.  A. STOMACH, BIOPSY:  -  Predominantly oxyntic type mucosa with no significant pathology.  -  No Helicobacter pylori organisms identified on HE stained slide.   B. ESOPHAGUS, BIOPSY:  -  Squamous mucosa with erosive esophagitis (no columnar mucosa is  present the biopsy specimen).    May 2022 Colonoscopy  Pertinent Findings:  - The examined portion of the ileum was normal. - Pseudopolyps in the sigmoid colon, in the descending colon and in the transverse colon. - Diverticulosis in the transverse colon and in the left colon. Biopsies taken throughout colon for surveillance Surgical [P], right colon biopsies ( h/o colitis ) - BENIGN COLONIC MUCOSA. - NO ACTIVE INFLAMMATION. - NO DYSPLASIA OR MALIGNANCY. 2. Surgical [P], colon, transverse ( h/o colitis ) - BENIGN COLONIC MUCOSA. - NO ACTIVE INFLAMMATION. - NO DYSPLASIA OR MALIGNANCY. 3. Surgical [P], left colon biopsies ( h/o colitis ) - BENIGN COLONIC MUCOSA. - NO ACTIVE INFLAMMATION. - NO DYSPLASIA OR MALIGNANCY.    Labs and Imaging:  Recent Labs    07/31/23 0034 07/31/23 0401  WBC 5.3 5.1  HGB 8.6* 9.0*  HCT 27.8* 29.7*  MCV 85.8 87.1  PLT 184 170   No results for input(s): "FOLATE", "VITAMINB12", "FERRITIN", "TIBC", "IRONPCTSAT" in the last 72 hours. Recent Labs    07/31/23 0034 07/31/23 0401  NA 138 137  K 3.2* 3.4*  CL 108 107  CO2 23 21*  GLUCOSE 97 90  BUN 13 12  CREATININE 0.99 0.99  CALCIUM  7.8* 7.7*   Recent Labs    07/31/23 0034 07/31/23 0401  PROT 5.9* 5.9*  ALBUMIN 1.8* 1.7*  AST 25 25  ALT 12 14  ALKPHOS 73 74  BILITOT 0.7 0.8   No results for input(s): "INR" in the last 72 hours. No results for input(s): "AFPTUMOR" in the last 72 hours.  MR 3D Recon At Scanner CLINICAL DATA:  RUQ abdominal pain, biliary disease suspected, no prior imaging.  EXAM: MRI ABDOMEN WITHOUT AND WITH CONTRAST (INCLUDING MRCP)  TECHNIQUE: Multiplanar multisequence MR imaging of the abdomen was  performed both before and after the administration of intravenous contrast. Heavily T2-weighted images of the biliary and pancreatic ducts were obtained, and three-dimensional MRCP images were rendered by post processing.  CONTRAST:  7mL GADAVIST GADOBUTROL 1 MMOL/ML IV SOLN  COMPARISON:  CT scan abdomen and pelvis from earlier the same day.  FINDINGS: Lower chest: Unremarkable MR appearance to the lung bases. No pleural effusion. No pericardial effusion. Normal heart size. Note is made of moderate sized left posteromedial diaphragmatic hernia containing ascitic fluid.  Hepatobiliary: The liver is normal in size. Noncirrhotic configuration. No discrete focal mass seen. The gallbladder is surgically absent. No intrahepatic bile duct dilation. There is dilated extrahepatic bile duct measuring up to 1.1 cm in diameter and containing a 9 x 9 x 30 mm  stone in the distal common bile duct.  Pancreas: No mass, inflammatory changes or other parenchymal abnormality identified. No main pancreatic duct dilation.  Spleen: Mildly enlarged in size measuring 8.2 x 14.3 cm. There are multiple, scattered, sub 5 mm cysts throughout the spleen.  Adrenals/Urinary Tract: Unremarkable adrenal glands. There multiple simple cortical cysts and sinus cysts in bilateral kidneys with largest cortical cyst in the right kidney interpolar region measuring up to 4.3 x 5.5 cm. No hydroureteronephrosis on either side.  Stomach/Bowel: There is moderate hiatal hernia containing proximal portion of the stomach. Visualized portions within the abdomen are unremarkable. No disproportionate dilation of bowel loops. Scattered colonic diverticula noted without diverticulitis.  Vascular/Lymphatic: No pathologically enlarged lymph nodes identified. No abdominal aortic aneurysm demonstrated. There is moderate to large ascites.  Other:  None.  Musculoskeletal: No suspicious bone lesions identified.  IMPRESSION: 1.  There is a 9 x 9 x 30 mm stone in the distal common bile duct. There is resultant extrahepatic bile duct dilation measuring up to 1.1 cm. No intrahepatic bile duct dilation. Surgically absent gallbladder. 2. There is moderate to large ascites. 3. Mild splenomegaly. 4. Multiple other nonacute observations, as described above.  Electronically Signed   By: Beula Brunswick M.D.   On: 07/31/2023 08:27 MR ABDOMEN MRCP W WO CONTAST CLINICAL DATA:  RUQ abdominal pain, biliary disease suspected, no prior imaging.  EXAM: MRI ABDOMEN WITHOUT AND WITH CONTRAST (INCLUDING MRCP)  TECHNIQUE: Multiplanar multisequence MR imaging of the abdomen was performed both before and after the administration of intravenous contrast. Heavily T2-weighted images of the biliary and pancreatic ducts were obtained, and three-dimensional MRCP images were rendered by post processing.  CONTRAST:  7mL GADAVIST GADOBUTROL 1 MMOL/ML IV SOLN  COMPARISON:  CT scan abdomen and pelvis from earlier the same day.  FINDINGS: Lower chest: Unremarkable MR appearance to the lung bases. No pleural effusion. No pericardial effusion. Normal heart size. Note is made of moderate sized left posteromedial diaphragmatic hernia containing ascitic fluid.  Hepatobiliary: The liver is normal in size. Noncirrhotic configuration. No discrete focal mass seen. The gallbladder is surgically absent. No intrahepatic bile duct dilation. There is dilated extrahepatic bile duct measuring up to 1.1 cm in diameter and containing a 9 x 9 x 30 mm stone in the distal common bile duct.  Pancreas: No mass, inflammatory changes or other parenchymal abnormality identified. No main pancreatic duct dilation.  Spleen: Mildly enlarged in size measuring 8.2 x 14.3 cm. There are multiple, scattered, sub 5 mm cysts throughout the spleen.  Adrenals/Urinary Tract: Unremarkable adrenal glands. There multiple simple cortical cysts and sinus cysts in bilateral  kidneys with largest cortical cyst in the right kidney interpolar region measuring up to 4.3 x 5.5 cm. No hydroureteronephrosis on either side.  Stomach/Bowel: There is moderate hiatal hernia containing proximal portion of the stomach. Visualized portions within the abdomen are unremarkable. No disproportionate dilation of bowel loops. Scattered colonic diverticula noted without diverticulitis.  Vascular/Lymphatic: No pathologically enlarged lymph nodes identified. No abdominal aortic aneurysm demonstrated. There is moderate to large ascites.  Other:  None.  Musculoskeletal: No suspicious bone lesions identified.  IMPRESSION: 1. There is a 9 x 9 x 30 mm stone in the distal common bile duct. There is resultant extrahepatic bile duct dilation measuring up to 1.1 cm. No intrahepatic bile duct dilation. Surgically absent gallbladder. 2. There is moderate to large ascites. 3. Mild splenomegaly. 4. Multiple other nonacute observations, as described above.  Electronically Signed   By:  Beula Brunswick M.D.   On: 07/31/2023 08:27 CT Angio Chest Pulmonary Embolism (PE) W or WO Contrast CLINICAL DATA:  Chest pain and suspected pulmonary embolism.  EXAM: CT ANGIOGRAPHY CHEST WITH CONTRAST  TECHNIQUE: Multidetector CT imaging of the chest was performed using the standard protocol during bolus administration of intravenous contrast. Multiplanar CT image reconstructions and MIPs were obtained to evaluate the vascular anatomy.  RADIATION DOSE REDUCTION: This exam was performed according to the departmental dose-optimization program which includes automated exposure control, adjustment of the mA and/or kV according to patient size and/or use of iterative reconstruction technique.  CONTRAST:  75mL OMNIPAQUE  IOHEXOL  350 MG/ML SOLN  COMPARISON:  Portable chest earlier today, portable chest 07/26/2023 and 07/17/2023, AP and lateral chest 03/22/2023, and CTA chest, abdomen and pelvis  12/03/2022.  FINDINGS: Cardiovascular: The cardiac size is normal. There is no pericardial effusion. The pulmonary veins are nondistended.  Small amount of scattered three-vessel coronary artery atherosclerosis noted plus calcific plaque in the left main coronary artery.  There is no evidence of arterial dilatation, no findings of acute right heart strain.  Arterial opacification is diagnostic. There is a segmental and subsegmental arterial thrombus posteriorly in the right upper lobe on 5: 54-58, and thrombus in the right upper lobe main artery on 5:60, all of which is nonoccluding.  In the right lower lobe there is a nonoccluding thrombus in the main lobar artery and in the anterior basal segmental artery, and in posteromedial basal subsegmental arteries on 5: 68-77.  On the left, there is a small thrombus at the main pulmonary artery bifurcation and a small linear thrombus extending a short distance into lower lobe main artery, all nonocclusive.  No other arterial emboli are detected. The overall clot burden appears small.  Mediastinum/Nodes: Moderate-to-large sized hiatal hernia containing about 1/4 of the stomach in addition to moderate ascitic fluid tracking up from the abdomen.  Fluid reflux is seen in the esophagus nearly to the thoracic inlet. Aspiration precautions are recommended unless already being done. No esophageal thickening is seen.  There are no enlarged intrathoracic or axillary lymph nodes or thyroid  nodules.  Lungs/Pleura: There are bilateral symmetric minimal layering pleural effusions, new.  Right hemidiaphragm is increasingly elevated with increased overlying atelectasis in right upper lobe base.  There are posterior linear atelectatic changes in basal lower lobes. Asymmetric perifissural atelectasis on the right.  There is a low inspiration on exam. Patchy mosaicism in the lungs is seen probably due to low lung volumes, alternatively small  airways disease. There is no consolidation or pneumothorax.  There is minimal paraseptal emphysematous change in the upper apices.  Upper Abdomen: Moderate to large volume upper abdominal ascites tracks into the hiatal hernia sac.  Status post cholecystectomy. There is trace pneumobilia lobe. No biliary dilatation.  The there are mesenteric congestive changes and body wall anasarca in the abdomen.  No other significant upper abdominal findings. Abdominal aortic atherosclerosis.  CT abdomen and pelvis with contrast is reported separately.  Musculoskeletal: Osteopenia and degenerative change thoracic spine. No acute or significant osseous findings. No chest wall mass is seen.  Review of the MIP images confirms the above findings.  IMPRESSION: 1. Bilateral nonoccluding pulmonary emboli as described above. Overall clot burden appears small. No findings of acute right heart strain. 2. Low inspiration with patchy mosaicism in the lungs probably due to low lung volumes, alternatively small airways disease. 3. Minimal layering pleural effusions. 4. Moderate-to-large sized hiatal hernia containing about 1/4 of the stomach  in addition to moderate ascitic fluid tracking up from the abdomen. 5. Fluid reflux is seen in the esophagus nearly to the thoracic inlet. Aspiration precautions are recommended unless already being done. 6. Moderate to large volume upper abdominal ascites, mesenteric congestive changes and body wall anasarca. CT abdomen and pelvis with contrast is reported separately. 7. Aortic and coronary artery atherosclerosis. 8. Osteopenia and degenerative change. 9. Critical Value/emergent results were called by telephone at the time of interpretation on 07/31/2023 at 1:55 am to provider [facility] , who verbally acknowledged these results.  Aortic Atherosclerosis (ICD10-I70.0) and Emphysema (ICD10-J43.9).  Electronically Signed   By: Denman Fischer M.D.   On:  07/31/2023 02:02 CT ABDOMEN PELVIS W CONTRAST CLINICAL DATA:  Abdominal pain, acute, nonlocalized  EXAM: CT ABDOMEN AND PELVIS WITH CONTRAST  TECHNIQUE: Multidetector CT imaging of the abdomen and pelvis was performed using the standard protocol following bolus administration of intravenous contrast.  RADIATION DOSE REDUCTION: This exam was performed according to the departmental dose-optimization program which includes automated exposure control, adjustment of the mA and/or kV according to patient size and/or use of iterative reconstruction technique.  CONTRAST:  75mL OMNIPAQUE  IOHEXOL  350 MG/ML SOLN  COMPARISON:  CT renal 05/29/2022, CT abdomen pelvis 06/18/2013  FINDINGS: Lower chest: Please see separately dictated CT angiography chest 07/31/2023.  Hepatobiliary: No focal liver abnormality. Status post cholecystectomy. No intrahepatic biliary ductal dilatation. Interval development of common bile duct dilatation measuring up to 9 mm with query associated density along the distal common bile duct (7:70, 3:34).  Pancreas: Diffusely atrophic. No focal lesion. Otherwise normal pancreatic contour. No surrounding inflammatory changes. No main pancreatic ductal dilatation.  Spleen: Normal in size without focal abnormality.  Adrenals/Urinary Tract:  No adrenal nodule bilaterally.  Bilateral kidneys enhance symmetrically. Fluid density lesion of the right kidney likely represents a simple renal cyst. Simple renal cysts, in the absence of clinically indicated signs/symptoms, require no independent follow-up.  No hydronephrosis. No hydroureter.  The urinary bladder is unremarkable.  Stomach/Bowel: Large hiatal hernia. Stomach is within normal limits. No evidence of bowel wall thickening or dilatation. The appendix is not definitely identified with no inflammatory changes in the right lower quadrant to suggest acute appendicitis.  Vascular/Lymphatic: No abdominal aorta  or iliac aneurysm. Severe atherosclerotic plaque of the aorta and its branches. No abdominal, pelvic, or inguinal lymphadenopathy.  Reproductive: Uterus and bilateral adnexa are unremarkable.  Other: Moderate volume intraperitoneal free fluid. No intraperitoneal free gas. No organized fluid collection.  Musculoskeletal:  Subcutaneus soft tissue edema. No abdominal wall hernia or abnormality.  No suspicious lytic or blastic osseous lesions. No acute displaced fracture. Multilevel degenerative changes of the spine.  IMPRESSION: 1. Interval development of common bile duct dilatation measuring up to 9 mm with query associated density along the distal common bile duct. Correlate with liver function tests and if clinically indicated consider MRI for further evaluation. 2. Status post cholecystectomy. 3. Moderate volume intraperitoneal free fluid. 4. Colonic diverticulosis with no acute diverticulitis. 5.  Aortic Atherosclerosis (ICD10-I70.0). 6. Please see separately dictated CT angiography chest 07/31/2023.  Electronically Signed   By: Morgane  Naveau M.D.   On: 07/31/2023 01:34 DG Chest Port 1 View CLINICAL DATA:  Chest pain  EXAM: PORTABLE CHEST 1 VIEW  COMPARISON:  None Available.  FINDINGS: Lung volumes are small and there is bibasilar atelectasis. Lungs otherwise clear. No pneumothorax or pleural effusion. Cardiac size within limits. Moderate hiatal hernia. Pulmonary vascularity is normal. No acute.  IMPRESSION: 1. Pulmonary  hypoinflation. 2. Moderate hiatal hernia.  Electronically Signed   By: Worthy Heads M.D.   On: 07/31/2023 01:02    Past Medical History:  Diagnosis Date   Abdominal pain 12/03/2022   Adrenal nodule (HCC)    Anemia    Blood transfusion without reported diagnosis    Cataract    bil removed   Choledocholithiasis with obstruction    Colitis    Crohn's colitis (HCC) 07/11/2012   Crohn's disease (HCC) 06/10/2013   Diastolic  dysfunction    a. 05/2013 Echo: EF 60-65%, no rwma, GrI DD, mild-mod TR.   Diverticulosis    Elevated blood pressure reading 02/21/2021   ESOPHAGEAL STRICTURE 11/27/2007   Qualifier: History of   By: Nelson-Smith CMA (AAMA), [name]         Fatty liver    GERD with esophagitis 11/27/2007   EGD showed LA Grade D reflux esophagitis 06/2023        Hematemesis 06/09/2023   Hiatal hernia    History of esophageal stricture    History of left heart catheterization    a. 05/2013 Cath: Nl cors. Nl R heart filling pressures.  No L -> R shunt.  EF 65%.   Hx of gastritis    Hyperlipidemia 01/24/2018   Lower extremity edema 12/03/2022   Palpitations    a. 09/2014 [facility]: Rare PACs/PVCs.   Pyelonephritis 05/29/2022   Right shoulder pain 12/03/2022   Sepsis (HCC) 12/03/2022   UTI (urinary tract infection) 05/14/2013    Past Surgical History:  Procedure Laterality Date   CARDIAC CATHETERIZATION  05/2013   CATARACT EXTRACTION Bilateral    CHOLECYSTECTOMY  2009   COLONOSCOPY  last 09/03/2017, 2020   ERCP  06/02/2011   Procedure: ENDOSCOPIC RETROGRADE CHOLANGIOPANCREATOGRAPHY (ERCP);  Surgeon: Kenney Peacemaker, MD;  Location: [facility];  Service: Endoscopy;  Laterality: N/A;   ESOPHAGOGASTRODUODENOSCOPY N/A 06/10/2023   Procedure: EGD (ESOPHAGOGASTRODUODENOSCOPY);  Surgeon: Elois Hair, MD;  Location: [facility] ENDOSCOPY;  Service: Gastroenterology;  Laterality: N/A;   HEMOSTASIS CLIP PLACEMENT  06/10/2023   Procedure: CONTROL OF HEMORRHAGE, GI TRACT, ENDOSCOPIC, BY CLIPPING OR OVERSEWING;  Surgeon: Elois Hair, MD;  Location: [facility] ENDOSCOPY;  Service: Gastroenterology;;   LEFT AND RIGHT HEART CATHETERIZATION WITH CORONARY ANGIOGRAM N/A 06/15/2013   Procedure: LEFT AND RIGHT HEART CATHETERIZATION WITH CORONARY ANGIOGRAM- Shunt series;  Surgeon: Arlander Bellman, MD;  Location: [facility] CATH LAB;  Service: Cardiovascular;  Laterality: N/A;   TUBAL LIGATION     UPPER GASTROINTESTINAL ENDOSCOPY       Family History  Problem Relation Age of Onset   Bone cancer Maternal Grandmother    Colon cancer Maternal Aunt 58   Stomach cancer Sister 70       decsd   Breast cancer Sister    Esophageal cancer Neg Hx    Rectal cancer Neg Hx    Pancreatic cancer Neg Hx    Colon polyps Neg Hx     Prior to Admission medications   Medication Sig Start Date End Date Taking? Authorizing Provider  apixaban  (ELIQUIS ) 5 MG TABS tablet Take 2 tablets (10 mg total) by mouth 2 (two) times daily for 2 days, THEN 1 tablet (5 mg total) 2 (two) times daily. 07/24/23 08/25/23 Yes Mabe, Doroteo Gasmen, MD  Cholecalciferol (VITAMIN D3 SUPER STRENGTH) 50 MCG (2000 UT) TABS Take 2,000 Units by mouth every morning.   Yes [provider]  ferrous sulfate  325 (65 FE) MG tablet Take 325 mg by mouth daily with breakfast.  Yes [provider]  furosemide  (LASIX ) 40 MG tablet Take 40 mg by mouth in the morning. For 5 days   Yes [provider]  mesalamine  (APRISO ) 0.375 g 24 hr capsule Take 4 capsules (1.5 g total) by mouth daily. Please keep your appointment in July for any further refills. Thank you Patient taking differently: Take 1.5 g by mouth daily. 07/21/23  Yes Armbruster, Lendon Queen, MD  nystatin  cream (MYCOSTATIN ) Apply 1 Application topically 2 (two) times daily. Apply to breast and abdomen   Yes [provider]  pantoprazole  (PROTONIX ) 40 MG tablet TAKE 1 TABLET BY MOUTH TWICE A DAY 07/17/23  Yes Everhart, Kirstie, DO  terbinafine (LAMISIL) 250 MG tablet Take 250 mg by mouth daily.   Yes [provider]  folic acid  (FOLVITE ) 400 MCG tablet Take 400 mcg by mouth every morning. Patient not taking: Reported on 07/31/2023    [provider]  hydrocortisone  1 % lotion Apply 1 Application topically 2 (two) times daily. Patient not taking: Reported on 07/31/2023    [provider]    Current Facility-Administered Medications  Medication Dose Route Frequency Provider  Last Rate Last Admin   heparin  ADULT infusion 100 units/mL (25000 units/250mL)  1,100 Units/hr Intravenous Continuous Carey Chapman, MD   Paused at 07/31/23 0636   mesalamine  (APRISO ) 24 hr capsule 1.5 g  1.5 g Oral Daily Carey Chapman, MD       pantoprazole  (PROTONIX ) EC tablet 40 mg  40 mg Oral BID Carey Chapman, MD       Current Outpatient Medications  Medication Sig Dispense Refill   apixaban  (ELIQUIS ) 5 MG TABS tablet Take 2 tablets (10 mg total) by mouth 2 (two) times daily for 2 days, THEN 1 tablet (5 mg total) 2 (two) times daily.     Cholecalciferol (VITAMIN D3 SUPER STRENGTH) 50 MCG (2000 UT) TABS Take 2,000 Units by mouth every morning.     ferrous sulfate  325 (65 FE) MG tablet Take 325 mg by mouth daily with breakfast.     furosemide  (LASIX ) 40 MG tablet Take 40 mg by mouth in the morning. For 5 days     mesalamine  (APRISO ) 0.375 g 24 hr capsule Take 4 capsules (1.5 g total) by mouth daily. Please keep your appointment in July for any further refills. Thank you (Patient taking differently: Take 1.5 g by mouth daily.) 360 capsule 0   nystatin  cream (MYCOSTATIN ) Apply 1 Application topically 2 (two) times daily. Apply to breast and abdomen     pantoprazole  (PROTONIX ) 40 MG tablet TAKE 1 TABLET BY MOUTH TWICE A DAY 180 tablet 1   terbinafine (LAMISIL) 250 MG tablet Take 250 mg by mouth daily.     folic acid  (FOLVITE ) 400 MCG tablet Take 400 mcg by mouth every morning. (Patient not taking: Reported on 07/31/2023)     hydrocortisone  1 % lotion Apply 1 Application topically 2 (two) times daily. (Patient not taking: Reported on 07/31/2023)      Allergies as of 07/31/2023   (No Known Allergies)    Social History   Socioeconomic History   Marital status: Divorced    Spouse name: Not on file   Number of children: 2   Years of education: Not on file   Highest education level: Not on file  Occupational History   Occupation: retired  Tobacco Use   Smoking status: Never   Smokeless tobacco:  Never  Vaping Use   Vaping status: Never Used  Substance and Sexual  Activity   Alcohol use: No    Alcohol/week: 0.0 standard drinks of alcohol   Drug use: No   Sexual activity: Not on file  Other Topics Concern   Not on file  Social History Narrative   Not on file   Social Drivers of Health   Financial Resource Strain: Low Risk  (12/31/2022)   Overall Financial Resource Strain (CARDIA)    Difficulty of Paying Living Expenses: Not hard at all  Food Insecurity: No Food Insecurity (07/18/2023)   Hunger Vital Sign    Worried About Running Out of Food in the Last Year: Never true    Ran Out of Food in the Last Year: Never true  Transportation Needs: No Transportation Needs (07/18/2023)   PRAPARE - Administrator, Civil Service (Medical): No    Lack of Transportation (Non-Medical): No  Physical Activity: Insufficiently Active (12/31/2022)   Exercise Vital Sign    Days of Exercise per Week: 3 days    Minutes of Exercise per Session: 30 min  Stress: No Stress Concern Present (12/31/2022)   [employer] of Occupational Health - Occupational Stress Questionnaire    Feeling of Stress : Not at all  Social Connections: Moderately Isolated (07/18/2023)   Social Connection and Isolation Panel [NHANES]    Frequency of Communication with Friends and Family: More than three times a week    Frequency of Social Gatherings with Friends and Family: More than three times a week    Attends Religious Services: 1 to 4 times per year    Active Member of [employer] or Organizations: No    Attends Banker Meetings: Never    Marital Status: Divorced  Catering manager Violence: Not At Risk (07/18/2023)   Humiliation, Afraid, Rape, and Kick questionnaire    Fear of Current or Ex-Partner: No    Emotionally Abused: No    Physically Abused: No    Sexually Abused: No     Code Status   Code Status: Full Code  Review of Systems: All systems reviewed and negative except where noted in  HPI.  Physical Exam: Vital signs in last 24 hours: Temp:  [97.5 F (36.4 C)-97.8 F (36.6 C)] 97.7 F (36.5 C) (04/30 0749) Pulse Rate:  [77-104] 91 (04/30 0749) Resp:  [18-32] 18 (04/30 0749) BP: (99-134)/(57-97) 99/81 (04/30 0749) SpO2:  [72 %-100 %] 94 % (04/30 0749) Weight:  [71.9 kg] 71.9 kg (04/30 0200)    General:  Pleasant female in NAD Psych:  Cooperative. Normal mood and affect Eyes: Pupils equal Ears:  Normal auditory acuity Nose: No deformity, discharge or lesions Neck:  Supple, no masses felt Lungs:  Occasional expiratory wheeze. Decreased breath sounds bilaterally .  Heart:  Regular rate, regular rhythm.  Abdomen:  Soft, moderately tender, active bowel sounds Rectal :  Deferred Msk: Symmetrical without gross deformities.  Neurologic:  Alert, some confusion. Etremities : Bilateral lower extremity pitting edema Skin:  Intact without significant lesions.    Intake/Output from previous day: No intake/output data recorded. Intake/Output this shift:  No intake/output data recorded.   Mai Schwalbe, NP-C   07/31/2023, 8:31 AM

## 2023-07-31 NOTE — Hospital Course (Addendum)
 Diane Freeman is a 78 y.o.female with a history of paroxysmal A-fib, DVT on Eliquis , diastolic CHF, Crohn's disease, UGI, dementia  who was admitted to the [facility] Medicine Teaching Service at [facility] for pulmonary embolism and ascites. Her hospital course is detailed below:  Pulmonary emboli Bilateral pulmonary emboli on CTPE, overall small clot burden. EKG without ischemic findings. Trops negative. Patient started on heparin  per pharmacy, home eliquis  held initially. Echo showed LVEF 60-65% and grade 1 diastolic dysfunction, unchanged from prior echo in March 2025. BNP slightly elevated.  She was started on heparin  and her respiratory status did improve quickly.  By the time of discharge she was off of supplemental oxygen.  Patient did have large DVT on last admission and was discharged on Eliquis ; question if this is related to DVT clot break-up, or if this PE has been present for some time (patient did not have CT PE on last admission).  CBD dilatation  Ascites  CBD dilatation up to 9 mm with density along distal CBD, moderate-to-large volume upper abdominal ascites, anasarca, and mesenteric congestion seen on CT A&P.  GI consulted and recommended ERCP in the outpatient setting. Paracentesis performed and 2.7 L removed with symptomatic improvement. Abdominal US  obtained to evaluate for possible re accumulation of fluid, and this showed scattered ascites. Repeat US  3 days later with moderate ascites, though decreased compared to prior US . Patient remained comfortable with stable mild distension, not requiring any additional tap prior to discharge.  Normocytic Anemia  Hemoglobin 8.6 on arrival (baseline 9-10). History of GI bleed in March 2025, found to have LA Grade D reflux esophagitis.  She did not have any evidence of active bleeding throughout her admission.  Other chronic conditions were medically managed with home medications and formulary alternatives as necessary (Afib, Crohn's  disease)  PCP Follow-up Recommendations: Ensure good adherence to Eliquis  regimen Follow-up ERCP in the outpatient setting with GI

## 2023-07-31 NOTE — ED Notes (Signed)
 Patient transported to IR

## 2023-07-31 NOTE — Assessment & Plan Note (Addendum)
 Hx of cholecystectomy. New ascites and CBD dilatation found on imaging. No cirrhosis on imaging. Normal LFTs and bilibubin on arrival. Lipase wnl. Body wall anasarca and mesenteric congestive changes on imaging.  -- Appreciate GI recs  -- AM CMP

## 2023-07-31 NOTE — Progress Notes (Signed)
 PHARMACY - ANTICOAGULATION CONSULT NOTE  Pharmacy Consult for IV heparin  Indication: pulmonary embolus  No Known Allergies  Patient Measurements: Height: 5\' 3"  (160 cm) Weight: 71.9 kg (158 lb 8.2 oz) IBW/kg (Calculated) : 52.4 HEPARIN  DW (KG): 67.4  Vital Signs: Temp: 97.4 F (36.3 C) (04/30 1132) Temp Source: Oral (04/30 1132) BP: 124/64 (04/30 1554) Pulse Rate: 79 (04/30 0930)  Labs: Recent Labs    07/31/23 0034 07/31/23 0337 07/31/23 0401 07/31/23 1540  HGB 8.6*  --  9.0*  --   HCT 27.8*  --  29.7*  --   PLT 184  --  170  --   APTT  --   --   --  86*  HEPARINUNFRC  --   --   --  >1.10*  CREATININE 0.99  --  0.99  --   TROPONINIHS 4 6  --   --     Estimated Creatinine Clearance: 43.8 mL/min (by C-G formula based on SCr of 0.99 mg/dL).   Medical History: Past Medical History:  Diagnosis Date   Abdominal pain 12/03/2022   Adrenal nodule (HCC)    Anemia    Blood transfusion without reported diagnosis    Cataract    bil removed   Choledocholithiasis with obstruction    Colitis    Crohn's colitis (HCC) 07/11/2012   Crohn's disease (HCC) 06/10/2013   Diastolic dysfunction    a. 05/2013 Echo: EF 60-65%, no rwma, GrI DD, mild-mod TR.   Diverticulosis    Elevated blood pressure reading 02/21/2021   ESOPHAGEAL STRICTURE 11/27/2007   Qualifier: History of   By: Nelson-Smith CMA (AAMA), [name]         Fatty liver    GERD with esophagitis 11/27/2007   EGD showed LA Grade D reflux esophagitis 06/2023        Hematemesis 06/09/2023   Hiatal hernia    History of esophageal stricture    History of left heart catheterization    a. 05/2013 Cath: Nl cors. Nl R heart filling pressures.  No L -> R shunt.  EF 65%.   Hx of gastritis    Hyperlipidemia 01/24/2018   Lower extremity edema 12/03/2022   Palpitations    a. 09/2014 Zio: Rare PACs/PVCs.   Pyelonephritis 05/29/2022   Right shoulder pain 12/03/2022   Sepsis (HCC) 12/03/2022   UTI (urinary tract infection)  05/14/2013   Assessment: Diane Freeman is a 78 y.o. year old female admitted on 07/31/2023 with concern for new PE. Recently started on eliquis  for new DVT (treatment dose) on 4/20 and has been getting doses while in facility. Pharmacy consulted to dose heparin .  PTT came back therapeutic this PM. HL still elevated due to apixaban .   Goal of Therapy:  aPTT 66-102 seconds Monitor platelets by anticoagulation protocol: Yes   Plan:  Cont heparin  infusion at 1100 units/hr (no bolus) aPTT in AM Daily aPTT, heparin  level, CBC, and monitoring for bleeding F/u plans for anticoagulation   Ivery Marking, PharmD, BCIDP, AAHIVP, CPP Infectious Disease Pharmacist 07/31/2023 5:00 PM

## 2023-07-31 NOTE — H&P (Addendum)
 Hospital Admission History and Physical Service Pager: [phone]  Patient name: Diane Freeman Medical record number: [id number] Date of Birth: Nov 21, 1945 Age: 78 y.o. Gender: female  Primary Care Provider: Sarahann Cumins, DO Consultants: GI Code Status: Patient currently lacks Medical Capacity. FULL CODE based on last admission. Will touch base with her family when able to confirm. No ACP documents on file.  Preferred Emergency Contact: Sister Contact Information     Name Relation Home Work Mobile   Jessup,Patty Sister [phone]  [phone]   Malena Scull Niece   [phone]      Other Contacts   None on File     Chief Complaint: SOB and chest/abdominal pain   Assessment and Plan: Diane Freeman is a 78 y.o. female presenting with SOB and chest/abdominal pain. Differential for presentation of this includes:   SOB PE: Bilateral pulmonary emboli seen on CTPE.  CHF: Bilateral 1-2+ pitting edema with basilar crackles on arrival.  Last echo in March 2025 with EF 60 to 65%, G1DD. Anasarca and mesenteric congestion on CT A&P.  ACS: EKG without ischemic findings. Troponin wnl.  Infection: Patient afebrile without leukocytosis.  Ascites CHF: Bilateral 1-2+ pitting edema with basilar crackles on arrival.  Last echo in March 2025 with EF 60-65%, G1DD. Liver dysfunction: CBD dilatation on CT A&P, no cirrhosis noted.  Normal LFTs and bilirubin on arrival.   Infection: Patient afebrile without leukocytosis. Assessment & Plan Pulmonary emboli (HCC) Bilateral pulmonary emboli on CTPE, overall small clot burden. Patient on Eliquis  at home. EKG without ischemic findings. Trops negative.  - Admit to Progressive with attending Dr Rumball - Heparin  per pharmacy, hold home Eliquis   - O2 supplementation as needed, goal O2 >92  - Continuous cardiac monitoring - Vitals per floor  - Daily APTT, heparin  level  - AM CBC, CMP Dilated bile duct  Ascites Hx of cholecystectomy.  New ascites and CBD dilatation found on imaging. No cirrhosis on imaging. Normal LFTs and bilibubin on arrival. Lipase wnl. Body wall anasarca and mesenteric congestive changes on imaging.  -- Appreciate GI recs  -- AM CMP  Hypokalemia K 3.2 on arrival. EKG similar to prior. - AM CMP - Replete as indicated - K ordered  Normocytic anemia Hemoglobin 8.6 on arrival (baseline 9-10). History of GI bleed in March 2025, found to have LA Grade D reflux esophagitis.  Unfortunately, now requiring heparin  for PE.  Will monitor closely in setting of anticoagulation. -- Appreciate GI recs  -- Cont home protonix  40 BID -- AM CBC Diastolic CHF (HCC) Bilateral 1-2+ pitting edema.  Bilateral crackles in bases.  Not on GDMT nor diuretics at home. Last echo in Mar 2025 with EF 60-65%, G1DD. Body wall anasarca and mesenteric congestion on CT A&P. -- Repeat echo ordered  - 20 mg IV Lasix  ordered  -- BNP ordered  -- AM CBC, CMP Chronic health problem Crohn's disease: Continue mesalamine  1500 mg daily. GI consulted appreciate recommendations. Paroxysmal atrial fibrillation: Heart rate controlled on admission. Does not take rate control agents. Holding home eliquis  in setting of heparin  drip for PE.  FEN/GI: Regular diet with aspiration precautions for reflux seen on imaging  VTE Prophylaxis: Heparin  per pharmacy   Disposition: Progressive  History of Present Illness:  Diane Freeman is a 78 y.o. female presenting with shortness of breath and abdominal pain.   Patient does not know why she is in the hospital or what took place prior. Per ED Triage note, patient with CP  and SOB starting earlier today, was given NTG x 3 at SNF without relief. Patient does endorse current abdominal pain and fullness as well as shortness of breath. No current chest pain. No blood in urine. No bloody or black stools. Endorses bruising easily.   In the ED, patient with intermittent tachypnea. Imaging revealed bilateral  pulmonary emboli, CBD dilatation, and new ascites. EKG without ischemic changes, troponin unremarkable. ED consulted GI.   Review Of Systems: Per HPI   Pertinent Past Medical History: Afib CHF Hx brain bleed HTN Crohns disease GERD Dementia  Recent UGIB, LA Grade D reflux esophagitis (Mar 2025) Recent septic shock iso Ecoli bacteremia and Staph Aureus UTI (4/16-4/23/25) Remainder reviewed in history tab.   Pertinent Past Surgical History: Cholecystectomy 2009 ERCP 2013 L/R Heart Cath 2015 EGD with clip placement 06/10/2023 Remainder reviewed in history tab.   Pertinent Social History: Tobacco use: Never Alcohol use: None Other Substance use: None [facility] at [facility]   Pertinent Family History: Sister: Breast cancer Sister: Stomach cancer Maternal grandmother: Bone cancer Remainder reviewed in history tab.   Important Outpatient Medications: Eliquis  5 BID Protonix  40 BID Mesalamine  1.5 daily  Remainder reviewed in medication history.   Objective: BP (!) 118/97   Pulse 83   Temp 97.8 F (36.6 C) (Oral)   Resp (!) 28   Ht 5\' 3"  (1.6 m)   Wt 71.9 kg   SpO2 100%   BMI 28.08 kg/m  Exam: General: No acute distress. Resting comfortably in room. CV: Normal S1/S2. No extra heart sounds. Warm and well-perfused. Pulm: Tachypneic. Course breath sounds throughout with some bibasilar crackles. No accessory muscle use.  Abd: Distended. Tender to palpation, no rebound or guarding.  Skin/Ext:  Warm, dry. BLE 1-2+ pitting edema to level of knee. 2+ bilateral DP pulses.  Neuro: Alert and oriented x4. PEERL. EEOMI. Facial sensation intact bilaterally. Facial expressions symmetric. Hearing intact bilaterally. Regular speech. Turns head against resistance bilaterally. Tongue midline. Sensation intact of upper and lower extremities bilaterally. 5/5 strength of upper and lower extremities.   Labs:  CBC BMET  Recent Labs  Lab 07/31/23 0034  WBC 5.3  HGB 8.6*  HCT 27.8*   PLT 184   Recent Labs  Lab 07/31/23 0034  NA 138  K 3.2*  CL 108  CO2 23  BUN 13  CREATININE 0.99  GLUCOSE 97  CALCIUM  7.8*    Lipase     Component Value Date/Time   LIPASE 50 07/31/2023 0034   Troponin: 4 > 6  EKG: Qtc 601. No acute ischemic findings.   Imaging Studies Performed:  CXR: Pulmonary hypoinflation  CT A&P:  - CBD dilatation up to 9 mm with density along distal CBD.  - Moderate volume intraperitoneal free fluids.   CTPE:   - Bilateral nonoccluding pulmonary emboli with overall small clot burden. No right heart strain.  - Minimal layering pleural effusions - Moderate-to-large hiatal hernia - Moderate-to-large volume upper abdominal ascites.  - Mesenteric congestive changes and body wall anasarca  - Fluid reflux in esophagus nearly to thoracic inlet   Carey Chapman, MD PGY-1, [facility] Family Medicine  FPTS Intern pager: [phone], text pages welcome Secure chat group [facility] Teaching Service   Upper Level Addendum: I have seen and evaluated this patient along with Dr. Fernand Howard and reviewed the above note, making necessary revisions as appropriate. I agree with the medical decision making and physical exam as noted above. Lavada Porteous, DO PGY-2 [facility] Family Medicine  Residency

## 2023-07-31 NOTE — Care Plan (Signed)
 FMTS Brief Progress Note  S: Patient seen this morning lying in bed.  She denies shortness of breath or difficulty breathing.  She denies pain.  She just ate breakfast and reports she probably ate too much causing her belly to be distended.  When asked about her CODE STATUS she confirms that she does want to be DNR.  She thinks she has discussed this with her son in the past, but she is not sure.  She does ask that we call her sister Theodora Fish to discuss any questions about her health or CODE STATUS.   O: BP (!) 111/48   Pulse 79   Temp 97.7 F (36.5 C) (Oral)   Resp (!) 23   Ht 5\' 3"  (1.6 m)   Wt 71.9 kg   SpO2 100%   BMI 28.08 kg/m   General: Frail-appearing, sitting up in bed, pleasant, conversant, no acute distress. HEENT: normocephalic, EOM grossly intact. Cardio: Regular rate, regular rhythm, no murmurs on exam. Pulm: Clear, no wheezing, no crackles. No increased work of breathing.  On 1L via [place]. Abdominal: bowel sounds present. distended, nontender to palpation.  Extremities: 2+ pitting edema bilaterally.  Moves all extremities equally. Neuro: Alert and oriented x3, speech normal in content, no facial asymmetry. Psych:  Cognition and judgment appear intact. Alert, communicative, and cooperative.  Assessment & Plan Pulmonary emboli (HCC) VSS, normal work of breathing this morning on 1 L via , patient denies difficulty breathing or shortness of breath.  Bilateral pulmonary emboli on CTPE, overall small clot burden. Patient on Eliquis  at home.  Doubt this is a true Eliquis  failure; patient did have a fairly large DVT and it is possible that the PE was caused by break-up of this clot. Could also have been present for some time as she did not have scan during last hospitalization. - Heparin  per pharmacy, hold home Eliquis   - O2 supplementation as needed, goal O2 >92  - Continuous cardiac monitoring - Daily APTT, heparin  level  - AM CBC, CMP Dilated bile duct  Ascites Hx of  cholecystectomy. New ascites and CBD dilatation found on imaging. No cirrhosis on imaging. Normal LFTs and bilibubin on arrival. Lipase WNL. Body wall anasarca and mesenteric congestive changes on imaging.  -- GI consulted, appreciate recommendations -- probably needs ERCP, family will likely need to consent -- AM CMP  Hypokalemia K 3.4 this AM after repletion with 40 mEq tablet. - AM CMP - Replete as indicated  Normocytic anemia Hemoglobin 8.6 on arrival, 9.0 on recheck (baseline 9-10). History of GI bleed in March 2025, found to have LA Grade D reflux esophagitis.  Unfortunately, now requiring heparin  for PE.  Will monitor closely in setting of anticoagulation.  No evidence of active bleeding. -- Appreciate GI recs  -- Cont home protonix  40 BID -- AM CBC Diastolic CHF (HCC) Bilateral 2+ pitting edema.  Bilateral crackles in bases.  Not on GDMT nor diuretics at home. Last echo in Mar 2025 with EF 60-65%, G1DD. Body wall anasarca and mesenteric congestion on CT A&P.  S/p 20 mg IV Lasix .  BNP 121.0. -- Echo pending -- AM CBC, CMP Chronic health problem Crohn's disease: Continue mesalamine  1500 mg daily. GI consulted appreciate recommendations. Paroxysmal atrial fibrillation: Heart rate controlled on admission. Does not take rate control agents. Holding home eliquis  in setting of heparin  drip for PE. Choledocholithiasis    CODE STATUS discussion: Plan to reach out to patient's sister as requested to discuss CODE STATUS.  Patient does  not have medical capacity at this time due to diagnosis of dementia.  Omar Bibber, DO 07/31/2023, 11:53 AM PGY-1, [facility] Please page [phone] with questions.

## 2023-07-31 NOTE — Assessment & Plan Note (Addendum)
 Bilateral 2+ pitting edema.  Bilateral crackles in bases.  Not on GDMT nor diuretics at home. Last echo in Mar 2025 with EF 60-65%, G1DD. Body wall anasarca and mesenteric congestion on CT A&P.  S/p 20 mg IV Lasix .  BNP 121.0. -- Echo pending -- AM CBC, CMP

## 2023-07-31 NOTE — ED Provider Notes (Signed)
 [facility]-EMERGENCY DEPT [facility] Emergency Department Provider Note MRN:  [id number]  Arrival date & time: 07/31/23     Chief Complaint   Chest Pain   History of Present Illness   Diane Freeman is a 78 y.o. year-old female with a history of chron's disease, presenting to the ED with chief complaint of chest pain.  Report of chest pain acute onset this evening.  Patient says chest pain but points to her upper abdomen as the source of her pain.  Also having right flank pain.  Moderate to severe.  Recent admission for UTI as well as a DVT.  Review of Systems  A thorough review of systems was obtained and all systems are negative except as noted in the HPI and PMH.   Patient's Health History    Past Medical History:  Diagnosis Date   Abdominal pain 12/03/2022   Adrenal nodule (HCC)    Anemia    Blood transfusion without reported diagnosis    Cataract    bil removed   Choledocholithiasis with obstruction    Colitis    Crohn's colitis (HCC) 07/11/2012   Crohn's disease (HCC) 06/10/2013   Diastolic dysfunction    a. 05/2013 Echo: EF 60-65%, no rwma, GrI DD, mild-mod TR.   Diverticulosis    Elevated blood pressure reading 02/21/2021   ESOPHAGEAL STRICTURE 11/27/2007   Qualifier: History of   By: Nelson-Smith CMA (AAMA), [name]         Fatty liver    GERD with esophagitis 11/27/2007   EGD showed LA Grade D reflux esophagitis 06/2023        Hematemesis 06/09/2023   Hiatal hernia    History of esophageal stricture    History of left heart catheterization    a. 05/2013 Cath: Nl cors. Nl R heart filling pressures.  No L -> R shunt.  EF 65%.   Hx of gastritis    Hyperlipidemia 01/24/2018   Lower extremity edema 12/03/2022   Palpitations    a. 09/2014 Zio: Rare PACs/PVCs.   Pyelonephritis 05/29/2022   Right shoulder pain 12/03/2022   Sepsis (HCC) 12/03/2022   UTI (urinary tract infection) 05/14/2013    Past Surgical History:  Procedure Laterality Date   CARDIAC  CATHETERIZATION  05/2013   CATARACT EXTRACTION Bilateral    CHOLECYSTECTOMY  2009   COLONOSCOPY  last 09/03/2017, 2020   ERCP  06/02/2011   Procedure: ENDOSCOPIC RETROGRADE CHOLANGIOPANCREATOGRAPHY (ERCP);  Surgeon: Kenney Peacemaker, MD;  Location: [facility];  Service: Endoscopy;  Laterality: N/A;   ESOPHAGOGASTRODUODENOSCOPY N/A 06/10/2023   Procedure: EGD (ESOPHAGOGASTRODUODENOSCOPY);  Surgeon: Elois Hair, MD;  Location: [facility] ENDOSCOPY;  Service: Gastroenterology;  Laterality: N/A;   HEMOSTASIS CLIP PLACEMENT  06/10/2023   Procedure: CONTROL OF HEMORRHAGE, GI TRACT, ENDOSCOPIC, BY CLIPPING OR OVERSEWING;  Surgeon: Elois Hair, MD;  Location: [facility] ENDOSCOPY;  Service: Gastroenterology;;   LEFT AND RIGHT HEART CATHETERIZATION WITH CORONARY ANGIOGRAM N/A 06/15/2013   Procedure: LEFT AND RIGHT HEART CATHETERIZATION WITH CORONARY ANGIOGRAM- Shunt series;  Surgeon: Arlander Bellman, MD;  Location: [facility] CATH LAB;  Service: Cardiovascular;  Laterality: N/A;   TUBAL LIGATION     UPPER GASTROINTESTINAL ENDOSCOPY      Family History  Problem Relation Age of Onset   Bone cancer Maternal Grandmother    Colon cancer Maternal Aunt 42   Stomach cancer Sister 74       decsd   Breast cancer Sister    Esophageal cancer Neg Hx  Rectal cancer Neg Hx    Pancreatic cancer Neg Hx    Colon polyps Neg Hx     Social History   Socioeconomic History   Marital status: Divorced    Spouse name: Not on file   Number of children: 2   Years of education: Not on file   Highest education level: Not on file  Occupational History   Occupation: retired  Tobacco Use   Smoking status: Never   Smokeless tobacco: Never  Vaping Use   Vaping status: Never Used  Substance and Sexual Activity   Alcohol use: No    Alcohol/week: 0.0 standard drinks of alcohol   Drug use: No   Sexual activity: Not on file  Other Topics Concern   Not on file  Social History Narrative   Not on file   Social Drivers of  Health   Financial Resource Strain: Low Risk  (12/31/2022)   Overall Financial Resource Strain (CARDIA)    Difficulty of Paying Living Expenses: Not hard at all  Food Insecurity: No Food Insecurity (07/18/2023)   Hunger Vital Sign    Worried About Running Out of Food in the Last Year: Never true    Ran Out of Food in the Last Year: Never true  Transportation Needs: No Transportation Needs (07/18/2023)   PRAPARE - Administrator, Civil Service (Medical): No    Lack of Transportation (Non-Medical): No  Physical Activity: Insufficiently Active (12/31/2022)   Exercise Vital Sign    Days of Exercise per Week: 3 days    Minutes of Exercise per Session: 30 min  Stress: No Stress Concern Present (12/31/2022)   [employer] of Occupational Health - Occupational Stress Questionnaire    Feeling of Stress : Not at all  Social Connections: Moderately Isolated (07/18/2023)   Social Connection and Isolation Panel [NHANES]    Frequency of Communication with Friends and Family: More than three times a week    Frequency of Social Gatherings with Friends and Family: More than three times a week    Attends Religious Services: 1 to 4 times per year    Active Member of [employer] or Organizations: No    Attends Banker Meetings: Never    Marital Status: Divorced  Catering manager Violence: Not At Risk (07/18/2023)   Humiliation, Afraid, Rape, and Kick questionnaire    Fear of Current or Ex-Partner: No    Emotionally Abused: No    Physically Abused: No    Sexually Abused: No     Physical Exam   Vitals:   07/31/23 0020 07/31/23 0120  BP: (!) 117/57 118/65  Pulse: 83 (!) 104  Resp: (!) 29 (!) 32  Temp: 97.8 F (36.6 C)   SpO2: 95% (!) 72%    CONSTITUTIONAL: Well-appearing, NAD NEURO/PSYCH:  Alert and oriented x 3, no focal deficits EYES:  eyes equal and reactive ENT/NECK:  no LAD, no JVD CARDIO: Regular rate, well-perfused, normal S1 and S2 PULM:  CTAB no wheezing or  rhonchi GI/GU:  non-distended, non-tender MSK/SPINE:  No gross deformities, no edema SKIN:  no rash, atraumatic   *Additional and/or pertinent findings included in MDM below  Diagnostic and Interventional Summary    EKG Interpretation Date/Time:  Wednesday July 31 2023 01:20:36 EDT Ventricular Rate:  88 PR Interval:    QRS Duration:  73 QT Interval:  496 QTC Calculation: 601 R Axis:   -8  Text Interpretation: Sinus rhythm Low voltage, precordial leads Prolonged QT interval Confirmed by  Gwenetta Lennert [phone]) on 07/31/2023 1:25:39 AM       Labs Reviewed  CBC - Abnormal; Notable for the following components:      Result Value   RBC 3.24 (*)    Hemoglobin 8.6 (*)    HCT 27.8 (*)    RDW 17.2 (*)    All other components within normal limits  COMPREHENSIVE METABOLIC PANEL WITH GFR - Abnormal; Notable for the following components:   Potassium 3.2 (*)    Calcium  7.8 (*)    Total Protein 5.9 (*)    Albumin 1.8 (*)    GFR, Estimated 58 (*)    All other components within normal limits  LIPASE, BLOOD  URINALYSIS, ROUTINE W REFLEX MICROSCOPIC  TROPONIN I (HIGH SENSITIVITY)  TROPONIN I (HIGH SENSITIVITY)    CT ABDOMEN PELVIS W CONTRAST  Final Result    DG Chest Port 1 View  Final Result    CT Angio Chest Pulmonary Embolism (PE) W or WO Contrast    (Results Pending)    Medications  fentaNYL  (SUBLIMAZE ) injection 50 mcg (50 mcg Intravenous Given 07/31/23 0136)  iohexol  (OMNIPAQUE ) 350 MG/ML injection 75 mL (75 mLs Intravenous Contrast Given 07/31/23 0119)     Procedures  /  Critical Care .Critical Care  Performed by: Edson Graces, MD Authorized by: Edson Graces, MD   Critical care provider statement:    Critical care time (minutes):  45   Critical care was necessary to treat or prevent imminent or life-threatening deterioration of the following conditions: Acute pulmonary emboli.   Critical care was time spent personally by me on the following activities:   Development of treatment plan with patient or surrogate, discussions with consultants, evaluation of patient's response to treatment, examination of patient, ordering and review of laboratory studies, ordering and review of radiographic studies, ordering and performing treatments and interventions, pulse oximetry, re-evaluation of patient's condition and review of old charts   ED Course and Medical Decision Making  Initial Impression and Ddx Report of chest pain but seems to mostly be upper abdominal pain with tenderness to palpation, also having right flank pain that is causing patient obvious discomfort.  Differential diagnosis includes atypical presentation of ACS, pancreatitis, cholecystitis, pyelonephritis, kidney stone, PE, perforated viscus.  Past medical/surgical history that increases complexity of ED encounter: Recent DVT, recent sepsis  Interpretation of Diagnostics I personally reviewed the EKG and my interpretation is as follows: Sinus rhythm  No significant blood count or electrolyte disturbance.  Patient Reassessment and Ultimate Disposition/Management     CT imaging reveals multiple pulmonary emboli, no heart strain.  Also new ascites within the abdomen and enlarged common bile duct.  Will request hospitalist admission.  Patient management required discussion with the following services or consulting groups:  Hospitalist Service  Complexity of Problems Addressed Acute illness or injury that poses threat of life of bodily function  Additional Data Reviewed and Analyzed Further history obtained from: Recent discharge summary  Additional Factors Impacting ED Encounter Risk Consideration of hospitalization  Merrick Abe. Harless Lien, MD [facility] Emergency Medicine [facility] [email or web address]  Final Clinical Impressions(s) / ED Diagnoses     ICD-10-CM   1. Other acute pulmonary embolism without acute cor pulmonale (HCC)  I26.99       ED Discharge  Orders     None        Discharge Instructions Discussed with and Provided to Patient:   Discharge Instructions   None  Edson Graces, MD 07/31/23 [phone]

## 2023-08-01 DIAGNOSIS — K8050 Calculus of bile duct without cholangitis or cholecystitis without obstruction: Secondary | ICD-10-CM | POA: Diagnosis not present

## 2023-08-01 DIAGNOSIS — R188 Other ascites: Secondary | ICD-10-CM

## 2023-08-01 DIAGNOSIS — K838 Other specified diseases of biliary tract: Secondary | ICD-10-CM | POA: Diagnosis not present

## 2023-08-01 DIAGNOSIS — Z7189 Other specified counseling: Secondary | ICD-10-CM

## 2023-08-01 DIAGNOSIS — I2699 Other pulmonary embolism without acute cor pulmonale: Secondary | ICD-10-CM

## 2023-08-01 DIAGNOSIS — I2694 Multiple subsegmental pulmonary emboli without acute cor pulmonale: Secondary | ICD-10-CM | POA: Diagnosis not present

## 2023-08-01 LAB — COMPREHENSIVE METABOLIC PANEL WITH GFR
ALT: 11 U/L (ref 0–44)
AST: 20 U/L (ref 15–41)
Albumin: 2.1 g/dL — ABNORMAL LOW (ref 3.5–5.0)
Alkaline Phosphatase: 55 U/L (ref 38–126)
Anion gap: 8 (ref 5–15)
BUN: 11 mg/dL (ref 8–23)
CO2: 23 mmol/L (ref 22–32)
Calcium: 7.9 mg/dL — ABNORMAL LOW (ref 8.9–10.3)
Chloride: 108 mmol/L (ref 98–111)
Creatinine, Ser: 0.96 mg/dL (ref 0.44–1.00)
GFR, Estimated: >60 mL/min (ref >60)
Glucose, Bld: 80 mg/dL (ref 70–99)
Potassium: 3.8 mmol/L (ref 3.5–5.1)
Sodium: 139 mmol/L (ref 135–145)
Total Bilirubin: 1.1 mg/dL (ref 0.0–1.2)
Total Protein: 5.2 g/dL — ABNORMAL LOW (ref 6.5–8.1)

## 2023-08-01 LAB — CBC
HCT: 23.5 % — ABNORMAL LOW (ref 36.0–46.0)
HCT: 27.4 % — ABNORMAL LOW (ref 36.0–46.0)
Hemoglobin: 7.4 g/dL — ABNORMAL LOW (ref 12.0–15.0)
Hemoglobin: 8.5 g/dL — ABNORMAL LOW (ref 12.0–15.0)
MCH: 26.8 pg (ref 26.0–34.0)
MCH: 26.6 pg (ref 26.0–34.0)
MCHC: 31.5 g/dL (ref 30.0–36.0)
MCHC: 31 g/dL (ref 30.0–36.0)
MCV: 85.1 fL (ref 80.0–100.0)
MCV: 85.9 fL (ref 80.0–100.0)
Platelets: 147 10*3/uL — ABNORMAL LOW (ref 150–400)
Platelets: 151 10*3/uL (ref 150–400)
RBC: 2.76 MIL/uL — ABNORMAL LOW (ref 3.87–5.11)
RBC: 3.19 MIL/uL — ABNORMAL LOW (ref 3.87–5.11)
RDW: 17.2 % — ABNORMAL HIGH (ref 11.5–15.5)
RDW: 17.3 % — ABNORMAL HIGH (ref 11.5–15.5)
WBC: 2.5 10*3/uL — ABNORMAL LOW (ref 4.0–10.5)
WBC: 3.7 10*3/uL — ABNORMAL LOW (ref 4.0–10.5)
nRBC: 0 % (ref 0.0–0.2)
nRBC: 0 % (ref 0.0–0.2)

## 2023-08-01 LAB — APTT
aPTT: 133 s — ABNORMAL HIGH (ref 24–36)
aPTT: 75 s — ABNORMAL HIGH (ref 24–36)
aPTT: 89 s — ABNORMAL HIGH (ref 24–36)

## 2023-08-01 LAB — HEPARIN LEVEL (UNFRACTIONATED): Heparin Unfractionated: 1 [IU]/mL — ABNORMAL HIGH (ref 0.30–0.70)

## 2023-08-01 LAB — PATHOLOGIST SMEAR REVIEW

## 2023-08-01 MED ORDER — APIXABAN 5 MG PO TABS: 5.0000 mg | ORAL_TABLET | Freq: Two times a day (BID) | ORAL | Status: DC

## 2023-08-01 MED ORDER — HEPARIN (PORCINE) 25000 UT/250ML-% IV SOLN
950.0000 [IU]/h | INTRAVENOUS | Status: DC
  Administered 2023-08-02: 850 [IU]/h via INTRAVENOUS
  Administered 2023-08-03: 900 [IU]/h via INTRAVENOUS
  Filled 2023-08-01 (×2): qty 250

## 2023-08-01 NOTE — Assessment & Plan Note (Addendum)
 Resolved this morning, K 3.8. - AM CMP to trend - Replete as indicated

## 2023-08-01 NOTE — Assessment & Plan Note (Addendum)
 Very well-appearing this morning, with no increased work of breathing.  Wearing nasal cannula but no oxygen flowing at the time.  VSS overnight. Low suspicion for failure of Eliquis  - will not plan to load Eliquis  prior to restarting. - Heparin  per pharmacy, transition to home Eliquis  5 mg BID when indicated - O2 supplementation as needed, goal O2 >92  - Continuous cardiac monitoring - Daily APTT, heparin  level  - AM CBC, CMP

## 2023-08-01 NOTE — Plan of Care (Signed)
 Called and spoke with sister to discuss outpatient follow-up for ERCP.  Sister reports that due to limited resources including transportation and family support, she would rather have the ERCP done inpatient.  Discussed that this will cause the patient to lose her SNF bed and will take several days to complete the procedure due to discontinuing the IV heparin .    Sister voiced understanding and still wished to pursue inpatient management.  Will let GI consult service know.   Clem Currier, DO [facility], PGY-2 08/01/23 2:01 PM

## 2023-08-01 NOTE — Progress Notes (Signed)
 PHARMACY - ANTICOAGULATION CONSULT NOTE  Pharmacy Consult for IV heparin  Indication: pulmonary embolus  No Known Allergies  Patient Measurements: Height: 5\' 3"  (160 cm) Weight: 66 kg (145 lb 8.1 oz) IBW/kg (Calculated) : 52.4 HEPARIN  DW (KG): 67.4  Vital Signs: Temp: 97.9 F (36.6 C) (05/01 2042) Temp Source: Oral (05/01 2042) BP: 112/56 (05/01 2042) Pulse Rate: 81 (05/01 2042)  Labs: Recent Labs    07/31/23 0034 07/31/23 1610 07/31/23 0401 07/31/23 0401 07/31/23 1540 08/01/23 0220 08/01/23 1201 08/01/23 1307 08/01/23 2102  HGB 8.6*  --  9.0*  --   --  7.4* 8.5*  --   --   HCT 27.8*  --  29.7*  --   --  23.5* 27.4*  --   --   PLT 184  --  170  --   --  147* 151  --   --   APTT  --   --   --    < > 86* 133*  --  75* 89*  HEPARINUNFRC  --   --   --   --  >1.10* 1.00*  --   --   --   CREATININE 0.99  --  0.99  --   --  0.96  --   --   --   TROPONINIHS 4 6  --   --   --   --   --   --   --    < > = values in this interval not displayed.    Estimated Creatinine Clearance: 43.4 mL/min (by C-G formula based on SCr of 0.96 mg/dL).  Assessment: 55 yof admitted with chest and abdominal pain and SOB found to have bilateral PE with small clot burden on CTA chest. Of note, patient had recent new DVT (07/17/23) and was started on apixaban  which was given at [facility]. Suspect this is not an apixaban  failure given recent DVT. She had a GIB and LA grade D reflux esophagitis in 06/2023. Pharmacy consulted to begin IV heparin .  Given recent apixaban  use, will monitor anticoagulation using aPTT until aPTT and heparin  levels correlate.   aPTT is therapeutic at 75 sec on 900 units/hr. No overt bleeding noted, Hgb down to 7.4 and up to 8.5 on recheck, platelets are low normal.  5/1 PM: aPTT (89 sec) slightly above goal on 900 units/hr.  New heparin  order placed at 1021 but hasn't been charted as running - confirmed with evening RN that it is still at 900 units/hr.     Goal of Therapy:   Heparin  level 0.3-0.5 units/ml aPTT 66-85 seconds Monitor platelets by anticoagulation protocol: Yes   Plan:  Decrease heparin  infusion to 850 units/hr (no bolus) 8 hour aPTT Daily aPTT, heparin  level, CBC Monitor for s/sx of bleeding F/u ability to resume apixaban    Thank you for involving pharmacy in this patient's care.  Cecillia Cogan, PharmD Clinical Pharmacist 08/01/2023  9:38 PM

## 2023-08-01 NOTE — Plan of Care (Signed)

## 2023-08-01 NOTE — Progress Notes (Signed)
 Daily Progress Note  DOA: 07/31/2023 Hospital Day: 2  Chief Complaint: Common bile duct stone  ASSESSMENT    78 y.o. year old female  with a medical history including but not limited to Crohn's disease, DVT, diastolic dysfunction, PAF  and GERD / esophageal stenosis . Admitted yesterday with PE, edema,  and abdominal pain   SHOB ( multifactorial) Anasarca Ascites, query if has cirrhosis ( though not seen on imaging Hx of Grade I DD / normal EF on Echo yesterday.   Abdominal pain  No history of cirrhosis and liver non-cirrhotic on CT scan but still concerned ascites could be 2/2 to cirrhosis with splenomegaly and ascitic fluid studies.  No portal hypertensive changes on recent EGD though with severe esophagitis small esophageal varices may have been missed. Status post 2.7 L paracentesis with improvement in abdominal pain .  No evidence for SBP . Fluid albumin  < 1.5. Unable to calculate SAAG. Total protein < 3.  Abdominal pain resolved after paracentesis. Anasarca maybe related to hypoalbuminemia.   Bilateral pulmonary emboli on Iv Heparin    Chronic [place] anemia Baseline 10-11.  Hgb has drifted this admission ( earlier today it was 7.4 but on recheck was 8.6) without overt GI blood loss    Choledocholithiasis  Normal LFTs MRCP showing a 9 x 9 x 30 mm stone in the distal common bile duct with resultant extrahepatic bile duct dilation measuring up to 1.1 cm. Afebrile. WBC is normal. No evidence for cholangitis.    Long standing Crohn's colitis.  No active disease on last colonoscopy in 2022.  Maintained on Apriso    GERD  Severe esophagitis on EGD in March 2025 Moderate to large hiatal hernia 1/4 of stomach and ascitic fluid tracking up from abdomen on imaging. Was,  or is scheduled to have follow EGD soon for follow up on esophagitis   AFIB On Eliquis  at home   Chronic [place] anemia No iron deficient on recent iron studies   Chronic thrombocytopenia Mild splenomegaly    PLAN   --Changing diet to 2 gram Na restricted.  --Will continue to evaluate for underlying liver disease --At some point she will need an ERCP for choledocholithiasis.  At that time we can reevaluate the severe esophagitis found on EGD in March -- Continue home Apriso  -- Continue pantoprazole  40 mg p.o. twice daily -- Monitor H/H.   Subjective   Feels okay. No abdominal pain    Objective     Recent Labs    07/31/23 0401 08/01/23 0220 08/01/23 1201  WBC 5.1 2.5* 3.7*  HGB 9.0* 7.4* 8.5*  HCT 29.7* 23.5* 27.4*  MCV 87.1 85.1 85.9  PLT 170 147* 151   No results for input(s): "FOLATE", "VITAMINB12", "FERRITIN", "TIBC", "IRONPCTSAT" in the last 72 hours. Recent Labs    07/31/23 0034 07/31/23 0401 08/01/23 0220  NA 138 137 139  K 3.2* 3.4* 3.8  CL 108 107 108  CO2 23 21* 23  GLUCOSE 97 90 80  BUN 13 12 11   CREATININE 0.99 0.99 0.96  CALCIUM  7.8* 7.7* 7.9*   Recent Labs    07/31/23 0034 07/31/23 0401 08/01/23 0220  PROT 5.9* 5.9* 5.2*  ALBUMIN  1.8* 1.7* 2.1*  AST 25 25 20   ALT 12 14 11   ALKPHOS 73 74 55  BILITOT 0.7 0.8 1.1   No results for input(s): "INR" in the last 72 hours. No results for input(s): "AFPTUMOR" in the last 72 hours.   No results for input(s): "ANA"  in the last 72 hours.  Imaging:  ECHOCARDIOGRAM COMPLETE    ECHOCARDIOGRAM REPORT       Patient Name:   Diane Freeman Date of Exam: 07/31/2023 Medical Rec #:  [id number]         Height:       63.0 in Accession #:    [id number]        Weight:       158.5 lb Date of Birth:  1945-11-10          BSA:          1.752 m Patient Age:    78 years          BP:           104/57 mmHg Patient Gender: F                 HR:           76 bpm. Exam Location:  Inpatient  Procedure: 2D Echo, Cardiac Doppler and Color Doppler (Both Spectral and Color            Flow Doppler were utilized during procedure).  Indications:    Dysnpea   History:        Patient has prior history of Echocardiogram  examinations, most                 recent 12/05/2022. CHF; Signs/Symptoms:Dyspnea.   Sonographer:    Janette Medley Referring Phys: [id number] Jarome Merritt RUMBALL  IMPRESSIONS   1. Left ventricular ejection fraction, by estimation, is 60 to 65%. The left ventricle has normal function. The left ventricle has no regional wall motion abnormalities. Left ventricular diastolic parameters are consistent with Grade I diastolic  dysfunction (impaired relaxation).  2. Right ventricular systolic function is normal. The right ventricular size is normal.  3. The mitral valve is normal in structure. Trivial mitral valve regurgitation. No evidence of mitral stenosis.  4. The aortic valve is normal in structure. Aortic valve regurgitation is mild. No aortic stenosis is present.  5. The inferior vena cava is normal in size with greater than 50% respiratory variability, suggesting right atrial pressure of 3 mmHg.  FINDINGS  Left Ventricle: Left ventricular ejection fraction, by estimation, is 60 to 65%. The left ventricle has normal function. The left ventricle has no regional wall motion abnormalities. The left ventricular internal cavity size was normal in size. There is  no left ventricular hypertrophy. Left ventricular diastolic parameters are consistent with Grade I diastolic dysfunction (impaired relaxation). Indeterminate filling pressures.  Right Ventricle: The right ventricular size is normal. No increase in right ventricular wall thickness. Right ventricular systolic function is normal.  Left Atrium: Left atrial size was normal in size.  Right Atrium: Right atrial size was normal in size.  Pericardium: There is no evidence of pericardial effusion.  Mitral Valve: The mitral valve is normal in structure. Trivial mitral valve regurgitation. No evidence of mitral valve stenosis.  Tricuspid Valve: The tricuspid valve is normal in structure. Tricuspid valve regurgitation is trivial. No evidence of tricuspid  stenosis.  Aortic Valve: The aortic valve is normal in structure. Aortic valve regurgitation is mild. No aortic stenosis is present.  Pulmonic Valve: The pulmonic valve was normal in structure. Pulmonic valve regurgitation is not visualized. No evidence of pulmonic stenosis.  Aorta: The aortic root is normal in size and structure.  Venous: The inferior vena cava is normal in size with greater than 50% respiratory variability, suggesting right atrial  pressure of 3 mmHg.  IAS/Shunts: No atrial level shunt detected by color flow Doppler.    LEFT VENTRICLE PLAX 2D LVIDd:         4.20 cm   Diastology LVIDs:         3.10 cm   LV e' medial:    6.42 cm/s LV PW:         0.70 cm   LV E/e' medial:  13.7 LV IVS:        0.90 cm   LV e' lateral:   6.42 cm/s LVOT diam:     2.00 cm   LV E/e' lateral: 13.7 LV SV:         51 LV SV Index:   29 LVOT Area:     3.14 cm    RIGHT VENTRICLE RV S prime:     9.03 cm/s TAPSE (M-mode): 1.8 cm  LEFT ATRIUM             Index        RIGHT ATRIUM          Index LA Vol (A2C):   19.6 ml 11.19 ml/m  RA Area:     6.40 cm LA Vol (A4C):   35.4 ml 20.21 ml/m  RA Volume:   7.87 ml  4.49 ml/m LA Biplane Vol: 28.6 ml 16.33 ml/m  AORTIC VALVE LVOT Vmax:   83.90 cm/s LVOT Vmean:  54.100 cm/s LVOT VTI:    0.163 m   AORTA Ao Root diam: 2.70 cm Ao Asc diam:  3.20 cm  MITRAL VALVE               TRICUSPID VALVE MV Area (PHT): 3.21 cm    TR Peak grad:   20.2 mmHg MV Decel Time: 236 msec    TR Vmax:        225.00 cm/s MV E velocity: 87.90 cm/s MV A velocity: 99.40 cm/s  SHUNTS MV E/A ratio:  0.88        Systemic VTI:  0.16 m                            Systemic Diam: 2.00 cm  Maudine Sos MD Electronically signed by Maudine Sos MD Signature Date/Time: 07/31/2023/6:34:26 PM      Final   IR Paracentesis INDICATION: Abdominal pain. Abdominal distention. Ascites noted on imaging. Request for diagnostic and therapeutic  paracentesis.  EXAM: ULTRASOUND GUIDED RIGHT LOWER QUADRANT PARACENTESIS  MEDICATIONS: 1% plain lidocaine , 6 mL  COMPLICATIONS: None immediate.  PROCEDURE: Informed written consent was obtained from the patient after a discussion of the risks, benefits and alternatives to treatment. A timeout was performed prior to the initiation of the procedure.  Initial ultrasound scanning demonstrates a large amount of ascites within the right lower abdominal quadrant. The right lower abdomen was prepped and draped in the usual sterile fashion. 1% lidocaine  was used for local anesthesia.  Following this, a 19 gauge, 7-cm, Yueh catheter was introduced. An ultrasound image was saved for documentation purposes. The paracentesis was performed. The catheter was removed and a dressing was applied. The patient tolerated the procedure well without immediate post procedural complication.  FINDINGS: A total of approximately 3.7 L of hazy, pale yellow fluid was removed. Samples were sent to the laboratory as requested by the clinical team.  IMPRESSION: Successful ultrasound-guided paracentesis yielding 3.7 liters of peritoneal fluid.  PLAN: If the patient eventually requires >/=2 paracenteses in a 30  day period, candidacy for formal evaluation by the [facility] will be assessed.  Procedure performed by Prudence Brown PA-C and supervised by Dr. Creasie Doctor  Electronically Signed   By: Creasie Doctor M.D.   On: 07/31/2023 14:37 MR 3D Recon At Scanner CLINICAL DATA:  RUQ abdominal pain, biliary disease suspected, no prior imaging.  EXAM: MRI ABDOMEN WITHOUT AND WITH CONTRAST (INCLUDING MRCP)  TECHNIQUE: Multiplanar multisequence MR imaging of the abdomen was performed both before and after the administration of intravenous contrast. Heavily T2-weighted images of the biliary and pancreatic ducts were obtained, and three-dimensional  MRCP images were rendered by post processing.  CONTRAST:  7mL GADAVIST  GADOBUTROL  1 MMOL/ML IV SOLN  COMPARISON:  CT scan abdomen and pelvis from earlier the same day.  FINDINGS: Lower chest: Unremarkable MR appearance to the lung bases. No pleural effusion. No pericardial effusion. Normal heart size. Note is made of moderate sized left posteromedial diaphragmatic hernia containing ascitic fluid.  Hepatobiliary: The liver is normal in size. Noncirrhotic configuration. No discrete focal mass seen. The gallbladder is surgically absent. No intrahepatic bile duct dilation. There is dilated extrahepatic bile duct measuring up to 1.1 cm in diameter and containing a 9 x 9 x 30 mm stone in the distal common bile duct.  Pancreas: No mass, inflammatory changes or other parenchymal abnormality identified. No main pancreatic duct dilation.  Spleen: Mildly enlarged in size measuring 8.2 x 14.3 cm. There are multiple, scattered, sub 5 mm cysts throughout the spleen.  Adrenals/Urinary Tract: Unremarkable adrenal glands. There multiple simple cortical cysts and sinus cysts in bilateral kidneys with largest cortical cyst in the right kidney interpolar region measuring up to 4.3 x 5.5 cm. No hydroureteronephrosis on either side.  Stomach/Bowel: There is moderate hiatal hernia containing proximal portion of the stomach. Visualized portions within the abdomen are unremarkable. No disproportionate dilation of bowel loops. Scattered colonic diverticula noted without diverticulitis.  Vascular/Lymphatic: No pathologically enlarged lymph nodes identified. No abdominal aortic aneurysm demonstrated. There is moderate to large ascites.  Other:  None.  Musculoskeletal: No suspicious bone lesions identified.  IMPRESSION: 1. There is a 9 x 9 x 30 mm stone in the distal common bile duct. There is resultant extrahepatic bile duct dilation measuring up to 1.1 cm. No intrahepatic bile duct dilation.  Surgically absent gallbladder. 2. There is moderate to large ascites. 3. Mild splenomegaly. 4. Multiple other nonacute observations, as described above.  Electronically Signed   By: Beula Brunswick M.D.   On: 07/31/2023 08:27 MR ABDOMEN MRCP W WO CONTAST CLINICAL DATA:  RUQ abdominal pain, biliary disease suspected, no prior imaging.  EXAM: MRI ABDOMEN WITHOUT AND WITH CONTRAST (INCLUDING MRCP)  TECHNIQUE: Multiplanar multisequence MR imaging of the abdomen was performed both before and after the administration of intravenous contrast. Heavily T2-weighted images of the biliary and pancreatic ducts were obtained, and three-dimensional MRCP images were rendered by post processing.  CONTRAST:  7mL GADAVIST  GADOBUTROL  1 MMOL/ML IV SOLN  COMPARISON:  CT scan abdomen and pelvis from earlier the same day.  FINDINGS: Lower chest: Unremarkable MR appearance to the lung bases. No pleural effusion. No pericardial effusion. Normal heart size. Note is made of moderate sized left posteromedial diaphragmatic hernia containing ascitic fluid.  Hepatobiliary: The liver is normal in size. Noncirrhotic configuration. No discrete focal mass seen. The gallbladder is surgically absent. No intrahepatic bile duct dilation. There is dilated extrahepatic bile duct measuring up to 1.1 cm in diameter and containing a  9 x 9 x 30 mm stone in the distal common bile duct.  Pancreas: No mass, inflammatory changes or other parenchymal abnormality identified. No main pancreatic duct dilation.  Spleen: Mildly enlarged in size measuring 8.2 x 14.3 cm. There are multiple, scattered, sub 5 mm cysts throughout the spleen.  Adrenals/Urinary Tract: Unremarkable adrenal glands. There multiple simple cortical cysts and sinus cysts in bilateral kidneys with largest cortical cyst in the right kidney interpolar region measuring up to 4.3 x 5.5 cm. No hydroureteronephrosis on either side.  Stomach/Bowel: There is  moderate hiatal hernia containing proximal portion of the stomach. Visualized portions within the abdomen are unremarkable. No disproportionate dilation of bowel loops. Scattered colonic diverticula noted without diverticulitis.  Vascular/Lymphatic: No pathologically enlarged lymph nodes identified. No abdominal aortic aneurysm demonstrated. There is moderate to large ascites.  Other:  None.  Musculoskeletal: No suspicious bone lesions identified.  IMPRESSION: 1. There is a 9 x 9 x 30 mm stone in the distal common bile duct. There is resultant extrahepatic bile duct dilation measuring up to 1.1 cm. No intrahepatic bile duct dilation. Surgically absent gallbladder. 2. There is moderate to large ascites. 3. Mild splenomegaly. 4. Multiple other nonacute observations, as described above.  Electronically Signed   By: Beula Brunswick M.D.   On: 07/31/2023 08:27 CT Angio Chest Pulmonary Embolism (PE) W or WO Contrast CLINICAL DATA:  Chest pain and suspected pulmonary embolism.  EXAM: CT ANGIOGRAPHY CHEST WITH CONTRAST  TECHNIQUE: Multidetector CT imaging of the chest was performed using the standard protocol during bolus administration of intravenous contrast. Multiplanar CT image reconstructions and MIPs were obtained to evaluate the vascular anatomy.  RADIATION DOSE REDUCTION: This exam was performed according to the departmental dose-optimization program which includes automated exposure control, adjustment of the mA and/or kV according to patient size and/or use of iterative reconstruction technique.  CONTRAST:  75mL OMNIPAQUE  IOHEXOL  350 MG/ML SOLN  COMPARISON:  Portable chest earlier today, portable chest 07/26/2023 and 07/17/2023, AP and lateral chest 03/22/2023, and CTA chest, abdomen and pelvis 12/03/2022.  FINDINGS: Cardiovascular: The cardiac size is normal. There is no pericardial effusion. The pulmonary veins are nondistended.  Small amount of scattered  three-vessel coronary artery atherosclerosis noted plus calcific plaque in the left main coronary artery.  There is no evidence of arterial dilatation, no findings of acute right heart strain.  Arterial opacification is diagnostic. There is a segmental and subsegmental arterial thrombus posteriorly in the right upper lobe on 5: 54-58, and thrombus in the right upper lobe main artery on 5:60, all of which is nonoccluding.  In the right lower lobe there is a nonoccluding thrombus in the main lobar artery and in the anterior basal segmental artery, and in posteromedial basal subsegmental arteries on 5: 68-77.  On the left, there is a small thrombus at the main pulmonary artery bifurcation and a small linear thrombus extending a short distance into lower lobe main artery, all nonocclusive.  No other arterial emboli are detected. The overall clot burden appears small.  Mediastinum/Nodes: Moderate-to-large sized hiatal hernia containing about 1/4 of the stomach in addition to moderate ascitic fluid tracking up from the abdomen.  Fluid reflux is seen in the esophagus nearly to the thoracic inlet. Aspiration precautions are recommended unless already being done. No esophageal thickening is seen.  There are no enlarged intrathoracic or axillary lymph nodes or thyroid  nodules.  Lungs/Pleura: There are bilateral symmetric minimal layering pleural effusions, new.  Right hemidiaphragm is increasingly elevated with increased  overlying atelectasis in right upper lobe base.  There are posterior linear atelectatic changes in basal lower lobes. Asymmetric perifissural atelectasis on the right.  There is a low inspiration on exam. Patchy mosaicism in the lungs is seen probably due to low lung volumes, alternatively small airways disease. There is no consolidation or pneumothorax.  There is minimal paraseptal emphysematous change in the upper apices.  Upper Abdomen: Moderate to large  volume upper abdominal ascites tracks into the hiatal hernia sac.  Status post cholecystectomy. There is trace pneumobilia lobe. No biliary dilatation.  The there are mesenteric congestive changes and body wall anasarca in the abdomen.  No other significant upper abdominal findings. Abdominal aortic atherosclerosis.  CT abdomen and pelvis with contrast is reported separately.  Musculoskeletal: Osteopenia and degenerative change thoracic spine. No acute or significant osseous findings. No chest wall mass is seen.  Review of the MIP images confirms the above findings.  IMPRESSION: 1. Bilateral nonoccluding pulmonary emboli as described above. Overall clot burden appears small. No findings of acute right heart strain. 2. Low inspiration with patchy mosaicism in the lungs probably due to low lung volumes, alternatively small airways disease. 3. Minimal layering pleural effusions. 4. Moderate-to-large sized hiatal hernia containing about 1/4 of the stomach in addition to moderate ascitic fluid tracking up from the abdomen. 5. Fluid reflux is seen in the esophagus nearly to the thoracic inlet. Aspiration precautions are recommended unless already being done. 6. Moderate to large volume upper abdominal ascites, mesenteric congestive changes and body wall anasarca. CT abdomen and pelvis with contrast is reported separately. 7. Aortic and coronary artery atherosclerosis. 8. Osteopenia and degenerative change. 9. Critical Value/emergent results were called by telephone at the time of interpretation on 07/31/2023 at 1:55 am to provider [facility] , who verbally acknowledged these results.  Aortic Atherosclerosis (ICD10-I70.0) and Emphysema (ICD10-J43.9).  Electronically Signed   By: Denman Fischer M.D.   On: 07/31/2023 02:02 CT ABDOMEN PELVIS W CONTRAST CLINICAL DATA:  Abdominal pain, acute, nonlocalized  EXAM: CT ABDOMEN AND PELVIS WITH CONTRAST  TECHNIQUE: Multidetector  CT imaging of the abdomen and pelvis was performed using the standard protocol following bolus administration of intravenous contrast.  RADIATION DOSE REDUCTION: This exam was performed according to the departmental dose-optimization program which includes automated exposure control, adjustment of the mA and/or kV according to patient size and/or use of iterative reconstruction technique.  CONTRAST:  75mL OMNIPAQUE  IOHEXOL  350 MG/ML SOLN  COMPARISON:  CT renal 05/29/2022, CT abdomen pelvis 06/18/2013  FINDINGS: Lower chest: Please see separately dictated CT angiography chest 07/31/2023.  Hepatobiliary: No focal liver abnormality. Status post cholecystectomy. No intrahepatic biliary ductal dilatation. Interval development of common bile duct dilatation measuring up to 9 mm with query associated density along the distal common bile duct (7:70, 3:34).  Pancreas: Diffusely atrophic. No focal lesion. Otherwise normal pancreatic contour. No surrounding inflammatory changes. No main pancreatic ductal dilatation.  Spleen: Normal in size without focal abnormality.  Adrenals/Urinary Tract:  No adrenal nodule bilaterally.  Bilateral kidneys enhance symmetrically. Fluid density lesion of the right kidney likely represents a simple renal cyst. Simple renal cysts, in the absence of clinically indicated signs/symptoms, require no independent follow-up.  No hydronephrosis. No hydroureter.  The urinary bladder is unremarkable.  Stomach/Bowel: Large hiatal hernia. Stomach is within normal limits. No evidence of bowel wall thickening or dilatation. The appendix is not definitely identified with no inflammatory changes in the right lower quadrant to suggest acute appendicitis.  Vascular/Lymphatic: No abdominal  aorta or iliac aneurysm. Severe atherosclerotic plaque of the aorta and its branches. No abdominal, pelvic, or inguinal lymphadenopathy.  Reproductive: Uterus and bilateral  adnexa are unremarkable.  Other: Moderate volume intraperitoneal free fluid. No intraperitoneal free gas. No organized fluid collection.  Musculoskeletal:  Subcutaneus soft tissue edema. No abdominal wall hernia or abnormality.  No suspicious lytic or blastic osseous lesions. No acute displaced fracture. Multilevel degenerative changes of the spine.  IMPRESSION: 1. Interval development of common bile duct dilatation measuring up to 9 mm with query associated density along the distal common bile duct. Correlate with liver function tests and if clinically indicated consider MRI for further evaluation. 2. Status post cholecystectomy. 3. Moderate volume intraperitoneal free fluid. 4. Colonic diverticulosis with no acute diverticulitis. 5.  Aortic Atherosclerosis (ICD10-I70.0). 6. Please see separately dictated CT angiography chest 07/31/2023.  Electronically Signed   By: Morgane  Naveau M.D.   On: 07/31/2023 01:34 DG Chest Port 1 View CLINICAL DATA:  Chest pain  EXAM: PORTABLE CHEST 1 VIEW  COMPARISON:  None Available.  FINDINGS: Lung volumes are small and there is bibasilar atelectasis. Lungs otherwise clear. No pneumothorax or pleural effusion. Cardiac size within limits. Moderate hiatal hernia. Pulmonary vascularity is normal. No acute.  IMPRESSION: 1. Pulmonary hypoinflation. 2. Moderate hiatal hernia.  Electronically Signed   By: Worthy Heads M.D.   On: 07/31/2023 01:02     Scheduled inpatient medications:   mesalamine   1.2 g Oral Q breakfast   nystatin  cream   Topical BID   pantoprazole   40 mg Oral BID   Continuous inpatient infusions:   heparin      PRN inpatient medications:   Vital signs in last 24 hours: Temp:  [97.4 F (36.3 C)-98 F (36.7 C)] 97.7 F (36.5 C) (05/01 0716) Pulse Rate:  [73-82] 80 (05/01 1048) Resp:  [18-23] 22 (05/01 1048) BP: (104-124)/(52-64) 104/61 (05/01 1048) SpO2:  [98 %-100 %] 100 % (05/01 1048) Weight:  [66 kg] 66  kg (05/01 0402) Last BM Date : 07/31/23  Intake/Output Summary (Last 24 hours) at 08/01/2023 1456 Last data filed at 08/01/2023 1333 Gross per 24 hour  Intake 487 ml  Output 150 ml  Net 337 ml    Intake/Output from previous day: 04/30 0701 - 05/01 0700 In: 207 [P.O.:207] Out: 150 [Urine:150] Intake/Output this shift: Total I/O In: 280 [P.O.:280] Out: -    Physical Exam:  General: Alert female in NAD Heart:  Regular rate and rhythm.  Pulmonary: Normal respiratory effort Abdomen: Soft, nondistended, nontender. Normal bowel sounds. Extremities:  pitting edema of BLE  Neurologic: Alert and oriented Psych: Pleasant. Cooperative    LOS: 1 day   Diane Freeman ,NP 08/01/2023, 2:56 PM

## 2023-08-01 NOTE — Assessment & Plan Note (Addendum)
 Hgb 9 > 7.4 this AM.  Suspect could be related to fluid shifts following significant paracentesis fluid removal yesterday; however patient does have history of GI bleeding and is on heparin  for PE.  On exam today no evidence of active bleeding. Will continue to monitor closely in the setting of anticoagulation. -- Appreciate GI recs  -- Cont home protonix  40 BID -- f/u PM CBC to trend Hgb -- AM CBC -- transfuse <7

## 2023-08-01 NOTE — TOC Initial Note (Addendum)
 Transition of Care [facility]) - Initial/Assessment Note    Patient Details  Name: Diane Freeman MRN: [id number] Date of Birth: [birth year]  Transition of Care [facility]) CM/SW Contact:    Arron Big, LCSWA Phone Number: 08/01/2023, 10:59 AM  Clinical Narrative:   CSW met patient at bedside and introduced self/role. Patient was unsure of what facility she admitted to hospital from. Per teaching service, patient is from [facility] CSW spoke with [facility] w/ admissions, she is going to reach out to family regarding a bed hold so that patient can dc back to [place]. If not, CSW will need to send a new referral and await acceptance regarding bed availability.   CSW confirmed [place]. Will need to call Kristen at [phone] after 12pm 5/3 to arrange, contingent on medical stability).   11:28 AM Per Starling Eck, patient cannot do a bed hold at this time. Will need to touch base with facility regarding bed availability Monday. Facility only does readmits over the weekend.   TOC will continue to follow.                  Expected Discharge Plan: Skilled Nursing Facility Barriers to Discharge: Continued Medical Work up, Other (must enter comment) (Facility to see if family will do bed hold, or will need to do another workup)   Patient Goals and CMS Choice Patient states their goals for this hospitalization and ongoing recovery are:: To return to SNF          Expected Discharge Plan and Services In-house Referral: Clinical Social Work     Living arrangements for the past 2 months: Skilled Holiday representative                                      Prior Living Arrangements/Services Living arrangements for the past 2 months: Skilled Nursing Facility Lives with:: Facility Resident Patient language and need for interpreter reviewed:: Yes Do you feel safe going back to the place where you live?: Yes      Need for Family  Participation in Patient Care: Yes (Comment) Care giver support system in place?: No (comment) Current home services: DME (walker, cane,) Criminal Activity/Legal Involvement Pertinent to Current Situation/Hospitalization: No - Comment as needed  Activities of Daily Living   ADL Screening (condition at time of admission) Independently performs ADLs?: Yes (appropriate for developmental age) Is the patient deaf or have difficulty hearing?: No Does the patient have difficulty seeing, even when wearing glasses/contacts?: No Does the patient have difficulty concentrating, remembering, or making decisions?: Yes  Permission Sought/Granted Permission sought to share information with : Facility Medical sales representative, Family Supports Permission granted to share information with : Yes, Verbal Permission Granted  Share Information with NAME: Theodora Fish  Permission granted to share info w AGENCY: SNFs  Permission granted to share info w Relationship: Sister  Permission granted to share info w Contact Information: [phone]  Emotional Assessment Appearance:: Appears stated age Attitude/Demeanor/Rapport: Engaged Affect (typically observed): Accepting, Stable, Pleasant Orientation: : Oriented to Self, Oriented to  Time Alcohol / Substance Use: Not Applicable Psych Involvement: No (comment)  Admission diagnosis:  Pulmonary emboli (HCC) [I26.99] Other acute pulmonary embolism without acute cor pulmonale (HCC) [I26.99] Patient Active Problem List   Diagnosis Date Noted   Goals of care, counseling/discussion 08/01/2023   Pulmonary emboli (HCC) 07/31/2023  Dilated bile duct  Ascites 07/31/2023   Choledocholithiasis 07/31/2023   Dementia (HCC)  Acute encephalopathy, resolved. 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 coli Bacteremia 12/03/2022   Actinic keratoses 02/17/2018   Hyperlipidemia 01/24/2018   Diastolic CHF (HCC) 06/14/2013   Crohn's disease (HCC) 06/10/2013   Hypokalemia 06/02/2011   ESOPHAGEAL STRICTURE 11/27/2007   GERD with esophagitis 11/27/2007   PCP:  Sarahann Cumins, DO Pharmacy:   [facility] [address] [place] [address] Phone: [phone] Fax: [phone]  [id number] - [place], [place] - [address] [place] [address] Phone: [phone] Fax: [phone]     Social Drivers of Health (SDOH) Social History: SDOH Screenings   Food Insecurity: No Food Insecurity (07/31/2023)  Housing: Low Risk  (07/31/2023)  Transportation Needs: No Transportation Needs (07/31/2023)  Utilities: Not At Risk (07/31/2023)  Alcohol Screen: Low Risk  (12/31/2022)  Depression (PHQ2-9): Medium Risk (06/14/2023)  Financial Resource Strain: Low Risk  (12/31/2022)  Physical Activity: Insufficiently Active (12/31/2022)  Social Connections: Moderately Isolated (07/31/2023)  Stress: No Stress Concern Present (12/31/2022)  Tobacco Use: Low Risk  (07/26/2023)  Health Literacy: Adequate Health Literacy (12/31/2022)   SDOH Interventions:     Readmission Risk Interventions    06/11/2023   11:24 AM  Readmission Risk Prevention Plan  Post Dischage Appt Complete  Medication Screening Complete  Transportation Screening Complete

## 2023-08-01 NOTE — Plan of Care (Signed)

## 2023-08-01 NOTE — Assessment & Plan Note (Signed)
 I spoke with patient regarding her wish for DNR status and she is very clear that this is what she wants.  At the time of our discussion she is alert and oriented, understanding of her medical situation, and can explain back to me the complexities of her care. In addition I called the patient's sister, Theodora Fish, per the patient's wishes to have this discussion with her as well.  Patient's sister is agreeable to DNR status. - CODE STATUS updated to DNR at this time.

## 2023-08-01 NOTE — Progress Notes (Signed)
 Mobility Specialist Progress Note:   08/01/23 1030  Mobility  Activity Ambulated with assistance in hallway  Level of Assistance Contact guard assist, steadying assist  Assistive Device Other (Comment) (IV pole)  Distance Ambulated (ft) 200 ft  Activity Response Tolerated well  Mobility Referral Yes  Mobility visit 1 Mobility  Mobility Specialist Start Time (ACUTE ONLY) 1030  Mobility Specialist Stop Time (ACUTE ONLY) 1045  Mobility Specialist Time Calculation (min) (ACUTE ONLY) 15 min   Pt agreeable to mobility session. Required contact assist for safety throughout ambulation pushing IV pole. VSS on RA, when reliable pleth available. Pt c/o generalized weakness, otherwise asx. Left sitting in chair with all needs met.   Oneda Big Mobility Specialist Please contact via SecureChat or  Rehab office at [phone]

## 2023-08-01 NOTE — Progress Notes (Signed)
 PHARMACY - ANTICOAGULATION  Pharmacy Consult for heparin  Indication: pulmonary embolus Brief A/P: aPTT supratherapeutic Decrease Heparin  rate  No Known Allergies  Patient Measurements: Height: 5\' 3"  (160 cm) Weight: 66 kg (145 lb 8.1 oz) IBW/kg (Calculated) : 52.4 HEPARIN  DW (KG): 67.4  Vital Signs: Temp: 97.6 F (36.4 C) (05/01 0402) Temp Source: Oral (05/01 0402) BP: 106/55 (05/01 0402) Pulse Rate: 82 (05/01 0402)  Labs: Recent Labs    07/31/23 0034 07/31/23 0337 07/31/23 0401 07/31/23 1540 08/01/23 0220  HGB 8.6*  --  9.0*  --  7.4*  HCT 27.8*  --  29.7*  --  23.5*  PLT 184  --  170  --  147*  APTT  --   --   --  86* 133*  HEPARINUNFRC  --   --   --  >1.10* 1.00*  CREATININE 0.99  --  0.99  --  0.96  TROPONINIHS 4 6  --   --   --     Estimated Creatinine Clearance: 43.4 mL/min (by C-G formula based on SCr of 0.96 mg/dL).  Assessment: 78 y.o. female with PE for heparin   Goal of Therapy:  aPTT 66-102 seconds Monitor platelets by anticoagulation protocol: Yes   Plan:  Decrease Heparin  900 units/hr Check aPTT in 8 hours   Claudine Cullens, PharmD, BCPS  08/01/2023,4:37 AM

## 2023-08-01 NOTE — Assessment & Plan Note (Addendum)
 Paracentesis yesterday with 2.7 L off; fluid studies without evidence of SBP.  Distention much improved on exam this morning, and patient denies any discomfort.  Will need additional workup to determine the cause of this new onset ascites. -- GI consulted, appreciate recommendations -- probably needs ERCP, possibly in the outpt setting -- AM CMP

## 2023-08-01 NOTE — Progress Notes (Addendum)
 Daily Progress Note Intern Pager: [phone]  Patient name: Diane Freeman Medical record number: [id number] Date of birth: 1945/09/08 Age: 78 y.o. Gender: female  Primary Care Provider: Sarahann Cumins, DO Consultants: GI Code Status: DNR - pt has capacity when she expressed this wish 4/30 and 5/1, and sister Diane Freeman contacted as collateral and also agrees with DNR status in accordance with pt wishes  Pt Overview and Major Events to Date:  4/30 - admitted; paracentesis w 3.7 L out  Assessment and Plan: Diane Freeman is a 78 y.o. female with PMH dementia, GERD esophagitis, Crohn's disease on mesalamine , recent DVT on Eliquis , HFpEF, HLD, PAF, h/o recent sepsis and Ecoli bacteremia with unknown source who presented for new onset SOB and was found to have PE.  Also with new onset ascites being followed by GI. Looking very well this AM. Assessment & Plan Pulmonary emboli (HCC) Very well-appearing this morning, with no increased work of breathing.  Wearing nasal cannula but no oxygen flowing at the time.  VSS overnight. Low suspicion for failure of Eliquis  - will not plan to load Eliquis  prior to restarting. - Heparin  per pharmacy, transition to home Eliquis  5 mg BID when indicated - O2 supplementation as needed, goal O2 >92  - Continuous cardiac monitoring - Daily APTT, heparin  level  - AM CBC, CMP Dilated bile duct  Ascites Paracentesis yesterday with 2.7 L off; fluid studies without evidence of SBP.  Distention much improved on exam this morning, and patient denies any discomfort.  Will need additional workup to determine the cause of this new onset ascites. -- GI consulted, appreciate recommendations -- probably needs ERCP, possibly in the outpt setting -- AM CMP  Hypokalemia Resolved this morning, K 3.8. - AM CMP to trend - Replete as indicated  Normocytic anemia Hgb 9 > 7.4 this AM.  Suspect could be related to fluid shifts following significant paracentesis fluid  removal yesterday; however patient does have history of GI bleeding and is on heparin  for PE.  On exam today no evidence of active bleeding. Will continue to monitor closely in the setting of anticoagulation. -- Appreciate GI recs  -- Cont home protonix  40 BID -- f/u PM CBC to trend Hgb -- AM CBC -- transfuse <7 Diastolic CHF (HCC) Bilateral 2+ pitting edema stable this AM.  Bilateral crackles in bases.  Not on GDMT nor diuretics at home. Last echo in Mar 2025 with EF 60-65%, G1DD; repeat echo here unchanged.  Body wall anasarca and mesenteric congestion on CT A&P.  May need additional diuretics for ascites, appreciate GI recommendations. -- AM CBC, CMP Goals of care, counseling/discussion I spoke with patient regarding her wish for DNR status and she is very clear that this is what she wants.  At the time of our discussion she is alert and oriented, understanding of her medical situation, and can explain back to me the complexities of her care. In addition I called the patient's sister, Diane Freeman, per the patient's wishes to have this discussion with her as well.  Patient's sister is agreeable to DNR status. - CODE STATUS updated to DNR at this time. Chronic health problem Crohn's disease: Continue mesalamine  1.2 g daily Paroxysmal atrial fibrillation: Heart rate controlled on admission. Does not take rate control agents. Holding home eliquis  in setting of heparin  drip for PE.   FEN/GI: Regular diet PPx: Eliquis  5 mg BID Dispo:SNF pending clinical improvement . Barriers include further GI workup for ascites.  Subjective:  Patient seen this morning at bedside about to get out of bed with RN assist to go to the bathroom.  She is alert and oriented, pleasant.  Reports her breathing is good and denies abdominal pain, nausea, vomiting.  Reports good appetite.  No other concerns.  Objective: Temp:  [97.4 F (36.3 C)-98 F (36.7 C)] 97.7 F (36.5 C) (05/01 0716) Pulse Rate:  [73-82] 80  (05/01 1048) Resp:  [18-23] 22 (05/01 1048) BP: (102-124)/(52-64) 104/61 (05/01 1048) SpO2:  [98 %-100 %] 100 % (05/01 1048) Weight:  [66 kg] 66 kg (05/01 0402) Physical Exam: General: Well-appearing, conversant, NAD Cardiovascular: RRR Respiratory: Normal work of breathing on room air ([place] in place, no supplemental oxygen running at this time).  Crackles in bilateral lung bases. Abdomen: Normoactive bowel sounds, soft, nontender.  Mildly distended but improved from yesterday. Extremities: 2+ pitting edema in BLE.  Moves all extremities equally.  Laboratory: Most recent CBC Lab Results  Component Value Date   WBC 3.7 (L) 08/01/2023   HGB 8.5 (L) 08/01/2023   HCT 27.4 (L) 08/01/2023   MCV 85.9 08/01/2023   PLT 151 08/01/2023   Most recent BMP    Latest Ref Rng & Units 08/01/2023    2:20 AM  BMP  Glucose 70 - 99 mg/dL 80   BUN 8 - 23 mg/dL 11   Creatinine 1.61 - 1.00 mg/dL 0.96   Sodium 045 - 409 mmol/L 139   Potassium 3.5 - 5.1 mmol/L 3.8   Chloride 98 - 111 mmol/L 108   CO2 22 - 32 mmol/L 23   Calcium  8.9 - 10.3 mg/dL 7.9    APTT 811  9/14 Echo: LVEF 60-65%. Grade 1 diastolic dysfunction.  (Unchanged from prior echo in March 2025)   Omar Bibber, DO 08/01/2023, 12:31 PM  PGY-1, [facility] FPTS Intern pager: [phone], text pages welcome Secure chat group [facility] Teaching Service

## 2023-08-01 NOTE — Assessment & Plan Note (Addendum)
 Bilateral 2+ pitting edema stable this AM.  Bilateral crackles in bases.  Not on GDMT nor diuretics at home. Last echo in Mar 2025 with EF 60-65%, G1DD; repeat echo here unchanged.  Body wall anasarca and mesenteric congestion on CT A&P.  May need additional diuretics for ascites, appreciate GI recommendations. -- AM CBC, CMP

## 2023-08-01 NOTE — Assessment & Plan Note (Addendum)
 Crohn's disease: Continue mesalamine  1.2 g daily Paroxysmal atrial fibrillation: Heart rate controlled on admission. Does not take rate control agents. Holding home eliquis  in setting of heparin  drip for PE.

## 2023-08-01 NOTE — Progress Notes (Signed)
 PHARMACY - ANTICOAGULATION CONSULT NOTE  Pharmacy Consult for IV heparin  Indication: pulmonary embolus  No Known Allergies  Patient Measurements: Height: 5\' 3"  (160 cm) Weight: 66 kg (145 lb 8.1 oz) IBW/kg (Calculated) : 52.4 HEPARIN  DW (KG): 67.4  Vital Signs: Temp: 97.7 F (36.5 C) (05/01 0716) Temp Source: Oral (05/01 0716) BP: 104/61 (05/01 1048) Pulse Rate: 80 (05/01 1048)  Labs: Recent Labs    07/31/23 0034 07/31/23 0337 07/31/23 0401 07/31/23 1540 08/01/23 0220 08/01/23 1201 08/01/23 1307  HGB 8.6*  --  9.0*  --  7.4* 8.5*  --   HCT 27.8*  --  29.7*  --  23.5* 27.4*  --   PLT 184  --  170  --  147* 151  --   APTT  --   --   --  86* 133*  --  75*  HEPARINUNFRC  --   --   --  >1.10* 1.00*  --   --   CREATININE 0.99  --  0.99  --  0.96  --   --   TROPONINIHS 4 6  --   --   --   --   --     Estimated Creatinine Clearance: 43.4 mL/min (by C-G formula based on SCr of 0.96 mg/dL).  Assessment: 4 yof admitted with chest and abdominal pain and SOB found to have bilateral PE with small clot burden on CTA chest. Of note, patient had recent new DVT (07/17/23) and was started on apixaban  which was given at [facility]. Suspect this is not an apixaban  failure given recent DVT. She had a GIB and LA grade D reflux esophagitis in 06/2023. Pharmacy consulted to begin IV heparin .  Given recent apixaban  use, will monitor anticoagulation using aPTT until aPTT and heparin  levels correlate.   aPTT is therapeutic at 75 sec on 900 units/hr. No overt bleeding noted, Hgb down to 7.4 and up to 8.5 on recheck, platelets are low normal.  Goal of Therapy:  Heparin  level 0.3-0.5 units/ml aPTT 66-85 seconds Monitor platelets by anticoagulation protocol: Yes   Plan:  Continue heparin  infusion at 900 units/hr (no bolus) 2100 confirm aPTT Daily aPTT, heparin  level, CBC Monitor for s/sx of bleeding F/u ability to resume apixaban    Thank you for involving pharmacy in this patient's  care.  Caroline Cinnamon, PharmD, BCPS Clinical Pharmacist Clinical phone for 08/01/2023 is [phone] 08/01/2023 2:21 PM

## 2023-08-02 ENCOUNTER — Inpatient Hospital Stay (HOSPITAL_COMMUNITY)

## 2023-08-02 ENCOUNTER — Encounter (HOSPITAL_COMMUNITY): Payer: Self-pay | Admitting: Family Medicine

## 2023-08-02 DIAGNOSIS — I2699 Other pulmonary embolism without acute cor pulmonale: Secondary | ICD-10-CM | POA: Diagnosis not present

## 2023-08-02 DIAGNOSIS — K8050 Calculus of bile duct without cholangitis or cholecystitis without obstruction: Secondary | ICD-10-CM | POA: Diagnosis not present

## 2023-08-02 DIAGNOSIS — I2694 Multiple subsegmental pulmonary emboli without acute cor pulmonale: Secondary | ICD-10-CM | POA: Diagnosis not present

## 2023-08-02 DIAGNOSIS — R188 Other ascites: Secondary | ICD-10-CM | POA: Diagnosis not present

## 2023-08-02 LAB — CBC
HCT: 29.4 % — ABNORMAL LOW (ref 36.0–46.0)
Hemoglobin: 9.1 g/dL — ABNORMAL LOW (ref 12.0–15.0)
MCH: 26.7 pg (ref 26.0–34.0)
MCHC: 31 g/dL (ref 30.0–36.0)
MCV: 86.2 fL (ref 80.0–100.0)
Platelets: 160 10*3/uL (ref 150–400)
RBC: 3.41 MIL/uL — ABNORMAL LOW (ref 3.87–5.11)
RDW: 17.3 % — ABNORMAL HIGH (ref 11.5–15.5)
WBC: 4 10*3/uL (ref 4.0–10.5)
nRBC: 0 % (ref 0.0–0.2)

## 2023-08-02 LAB — COMPREHENSIVE METABOLIC PANEL WITH GFR
ALT: 13 U/L (ref 0–44)
AST: 28 U/L (ref 15–41)
Albumin: 2.2 g/dL — ABNORMAL LOW (ref 3.5–5.0)
Alkaline Phosphatase: 62 U/L (ref 38–126)
Anion gap: 10 (ref 5–15)
BUN: 10 mg/dL (ref 8–23)
CO2: 20 mmol/L — ABNORMAL LOW (ref 22–32)
Calcium: 7.9 mg/dL — ABNORMAL LOW (ref 8.9–10.3)
Chloride: 105 mmol/L (ref 98–111)
Creatinine, Ser: 1.04 mg/dL — ABNORMAL HIGH (ref 0.44–1.00)
GFR, Estimated: 55 mL/min — ABNORMAL LOW (ref >60)
Glucose, Bld: 95 mg/dL (ref 70–99)
Potassium: 3.7 mmol/L (ref 3.5–5.1)
Sodium: 135 mmol/L (ref 135–145)
Total Bilirubin: 0.7 mg/dL (ref 0.0–1.2)
Total Protein: 5.6 g/dL — ABNORMAL LOW (ref 6.5–8.1)

## 2023-08-02 LAB — HEPARIN LEVEL (UNFRACTIONATED): Heparin Unfractionated: 0.43 [IU]/mL (ref 0.30–0.70)

## 2023-08-02 LAB — APTT: aPTT: 71 s — ABNORMAL HIGH (ref 24–36)

## 2023-08-02 MED ORDER — SPIRONOLACTONE 25 MG PO TABS
50.0000 mg | ORAL_TABLET | Freq: Every day | ORAL | Status: DC
  Administered 2023-08-03 – 2023-08-07 (×5): 50 mg via ORAL
  Filled 2023-08-02 (×5): qty 2

## 2023-08-02 MED ORDER — FUROSEMIDE 20 MG PO TABS
20.0000 mg | ORAL_TABLET | Freq: Every day | ORAL | Status: DC
  Administered 2023-08-03 – 2023-08-07 (×5): 20 mg via ORAL
  Filled 2023-08-02 (×5): qty 1

## 2023-08-02 MED ORDER — SPIRONOLACTONE 25 MG PO TABS
100.0000 mg | ORAL_TABLET | Freq: Every day | ORAL | Status: DC
  Administered 2023-08-02: 100 mg via ORAL
  Filled 2023-08-02: qty 4

## 2023-08-02 MED ORDER — FUROSEMIDE 40 MG PO TABS
40.0000 mg | ORAL_TABLET | Freq: Every day | ORAL | Status: DC
  Administered 2023-08-02: 40 mg via ORAL
  Filled 2023-08-02: qty 1

## 2023-08-02 MED ORDER — MESALAMINE 1.2 G PO TBEC
2.4000 g | DELAYED_RELEASE_TABLET | Freq: Every day | ORAL | Status: DC
  Administered 2023-08-03 – 2023-08-07 (×5): 2.4 g via ORAL
  Filled 2023-08-02 (×6): qty 2

## 2023-08-02 NOTE — Progress Notes (Addendum)
 Daily Progress Note  DOA: 07/31/2023 Hospital Day: 3   Chief Complaint: Common bile duct stone  ASSESSMENT    Brief narrative 78 y.o. year old female with a medical history including but not limited to Crohn's disease, DVT, diastolic dysfunction, PAF and GERD / esophageal stenosis . Admitted 4/30 with PE, anasarca, abdominal pain and incidental CBD stone,  See 4/30 GI consult not    Anasarca Ascites, query if has cirrhosis ( though not seen on imaging Hx of Grade I DD / normal EF on Echo yesterday.   Abdominal pain  No history of cirrhosis and liver non-cirrhotic on CT scan.  Does not appear to have worsening heart failure. Anasarca maybe related in part to hypoalbuminemia.  No portal hypertensive changes on recent EGD (though with severe esophagitis small esophageal varices may have been missed). However, would still consider underlying cirrhosis given splenomegaly . Status post 3.7 L paracentesis with resolution of abdominal pain .  No evidence for SBP .     Bilateral pulmonary emboli on Iv Heparin    Chronic [place] anemia Baseline 10-11.  Hgb has drifted this admission ( earlier today it was 7.4 but on recheck was 8.6) without overt GI blood loss     Choledocholithiasis  Normal LFTs MRCP showing a 9 x 9 x 30 mm stone in the distal common bile duct with resultant extrahepatic bile duct dilation measuring up to 1.1 cm. Afebrile. WBC is normal. No evidence for cholangitis.    Long standing Crohn's colitis.  No active disease on last colonoscopy in 2022.  Maintained on Apriso    GERD  Severe esophagitis on EGD in March 2025 Moderate to large hiatal hernia 1/4 of stomach and ascitic fluid tracking up from abdomen on imaging. Was,  or is scheduled to have follow EGD soon for follow up on esophagitis   AFIB On Eliquis  at home   Chronic [place] anemia No iron deficient on recent iron studies   Chronic thrombocytopenia Mild splenomegaly   Principal Problem:   Pulmonary emboli  (HCC) Active Problems:   Diastolic CHF (HCC)   Normocytic anemia   Chronic health problem   Dilated bile duct  Ascites   Choledocholithiasis   Goals of care, counseling/discussion   PLAN   --Continue 2 gram Na restricted.  --Will start Lasix  40 mg and Aldactone  50 mg daily. Monitor renal function daily while hospitalized.  --Will continue to evaluate for underlying liver disease -- Continue home Apriso  -- Continue pantoprazole  40 mg p.o. twice daily -- Monitor H/H.  --No plans for ERCP for bile duct stone removal. There is no evidence for biliary obstruction and she is at high risk for procedure with multiple medical problems and acute PE requiring anticoagulation   Subjective   Feels okay.  No abdominal pain.  A little sleepy today  Objective   GI Studies:   07/31/23 3.7 L paracentesis Fluid albumin  < 1.5. Unable to calculate SAAG. Total protein < 3. Path review: Inflammatory and mesothelial cells. Culture negative at 2 days  Recent Labs    08/01/23 0220 08/01/23 1201 08/02/23 0959  WBC 2.5* 3.7* 4.0  HGB 7.4* 8.5* 9.1*  HCT 23.5* 27.4* 29.4*  MCV 85.1 85.9 86.2  PLT 147* 151 160   No results for input(s): "FOLATE", "VITAMINB12", "FERRITIN", "TIBC", "IRONPCTSAT" in the last 72 hours. Recent Labs    07/31/23 0401 08/01/23 0220 08/02/23 0959  NA 137 139 135  K 3.4* 3.8 3.7  CL 107 108 105  CO2 21* 23 20*  GLUCOSE 90 80 95  BUN 12 11 10   CREATININE 0.99 0.96 1.04*  CALCIUM  7.7* 7.9* 7.9*   Recent Labs    07/31/23 0401 08/01/23 0220 08/02/23 0959  PROT 5.9* 5.2* 5.6*  ALBUMIN  1.7* 2.1* 2.2*  AST 25 20 28   ALT 14 11 13   ALKPHOS 74 55 62  BILITOT 0.8 1.1 0.7   No results for input(s): "INR" in the last 72 hours. No results for input(s): "AFPTUMOR" in the last 72 hours.   No results for input(s): "ANA" in the last 72 hours.  Imaging:  ECHOCARDIOGRAM COMPLETE    ECHOCARDIOGRAM REPORT       Patient Name:   ELANORA RAMSOUR Date of Exam:  07/31/2023 Medical Rec #:  [id number]         Height:       63.0 in Accession #:    [id number]        Weight:       158.5 lb Date of Birth:  1945/11/03          BSA:          1.752 m Patient Age:    78 years          BP:           104/57 mmHg Patient Gender: F                 HR:           76 bpm. Exam Location:  Inpatient  Procedure: 2D Echo, Cardiac Doppler and Color Doppler (Both Spectral and Color            Flow Doppler were utilized during procedure).  Indications:    Dysnpea   History:        Patient has prior history of Echocardiogram examinations, most                 recent 12/05/2022. CHF; Signs/Symptoms:Dyspnea.   Sonographer:    Janette Medley Referring Phys: [id number] Jarome Merritt RUMBALL  IMPRESSIONS   1. Left ventricular ejection fraction, by estimation, is 60 to 65%. The left ventricle has normal function. The left ventricle has no regional wall motion abnormalities. Left ventricular diastolic parameters are consistent with Grade I diastolic  dysfunction (impaired relaxation).  2. Right ventricular systolic function is normal. The right ventricular size is normal.  3. The mitral valve is normal in structure. Trivial mitral valve regurgitation. No evidence of mitral stenosis.  4. The aortic valve is normal in structure. Aortic valve regurgitation is mild. No aortic stenosis is present.  5. The inferior vena cava is normal in size with greater than 50% respiratory variability, suggesting right atrial pressure of 3 mmHg.  FINDINGS  Left Ventricle: Left ventricular ejection fraction, by estimation, is 60 to 65%. The left ventricle has normal function. The left ventricle has no regional wall motion abnormalities. The left ventricular internal cavity size was normal in size. There is  no left ventricular hypertrophy. Left ventricular diastolic parameters are consistent with Grade I diastolic dysfunction (impaired relaxation). Indeterminate filling pressures.  Right Ventricle: The right  ventricular size is normal. No increase in right ventricular wall thickness. Right ventricular systolic function is normal.  Left Atrium: Left atrial size was normal in size.  Right Atrium: Right atrial size was normal in size.  Pericardium: There is no evidence of pericardial effusion.  Mitral Valve: The mitral valve is normal in structure. Trivial mitral valve regurgitation. No  evidence of mitral valve stenosis.  Tricuspid Valve: The tricuspid valve is normal in structure. Tricuspid valve regurgitation is trivial. No evidence of tricuspid stenosis.  Aortic Valve: The aortic valve is normal in structure. Aortic valve regurgitation is mild. No aortic stenosis is present.  Pulmonic Valve: The pulmonic valve was normal in structure. Pulmonic valve regurgitation is not visualized. No evidence of pulmonic stenosis.  Aorta: The aortic root is normal in size and structure.  Venous: The inferior vena cava is normal in size with greater than 50% respiratory variability, suggesting right atrial pressure of 3 mmHg.  IAS/Shunts: No atrial level shunt detected by color flow Doppler.    LEFT VENTRICLE PLAX 2D LVIDd:         4.20 cm   Diastology LVIDs:         3.10 cm   LV e' medial:    6.42 cm/s LV PW:         0.70 cm   LV E/e' medial:  13.7 LV IVS:        0.90 cm   LV e' lateral:   6.42 cm/s LVOT diam:     2.00 cm   LV E/e' lateral: 13.7 LV SV:         51 LV SV Index:   29 LVOT Area:     3.14 cm    RIGHT VENTRICLE RV S prime:     9.03 cm/s TAPSE (M-mode): 1.8 cm  LEFT ATRIUM             Index        RIGHT ATRIUM          Index LA Vol (A2C):   19.6 ml 11.19 ml/m  RA Area:     6.40 cm LA Vol (A4C):   35.4 ml 20.21 ml/m  RA Volume:   7.87 ml  4.49 ml/m LA Biplane Vol: 28.6 ml 16.33 ml/m  AORTIC VALVE LVOT Vmax:   83.90 cm/s LVOT Vmean:  54.100 cm/s LVOT VTI:    0.163 m   AORTA Ao Root diam: 2.70 cm Ao Asc diam:  3.20 cm  MITRAL VALVE               TRICUSPID VALVE MV Area  (PHT): 3.21 cm    TR Peak grad:   20.2 mmHg MV Decel Time: 236 msec    TR Vmax:        225.00 cm/s MV E velocity: 87.90 cm/s MV A velocity: 99.40 cm/s  SHUNTS MV E/A ratio:  0.88        Systemic VTI:  0.16 m                            Systemic Diam: 2.00 cm  Maudine Sos MD Electronically signed by Maudine Sos MD Signature Date/Time: 07/31/2023/6:34:26 PM      Final   IR Paracentesis INDICATION: Abdominal pain. Abdominal distention. Ascites noted on imaging. Request for diagnostic and therapeutic paracentesis.  EXAM: ULTRASOUND GUIDED RIGHT LOWER QUADRANT PARACENTESIS  MEDICATIONS: 1% plain lidocaine , 6 mL  COMPLICATIONS: None immediate.  PROCEDURE: Informed written consent was obtained from the patient after a discussion of the risks, benefits and alternatives to treatment. A timeout was performed prior to the initiation of the procedure.  Initial ultrasound scanning demonstrates a large amount of ascites within the right lower abdominal quadrant. The right lower abdomen was prepped and draped in the usual sterile fashion. 1% lidocaine  was used  for local anesthesia.  Following this, a 19 gauge, 7-cm, Yueh catheter was introduced. An ultrasound image was saved for documentation purposes. The paracentesis was performed. The catheter was removed and a dressing was applied. The patient tolerated the procedure well without immediate post procedural complication.  FINDINGS: A total of approximately 3.7 L of hazy, pale yellow fluid was removed. Samples were sent to the laboratory as requested by the clinical team.  IMPRESSION: Successful ultrasound-guided paracentesis yielding 3.7 liters of peritoneal fluid.  PLAN: If the patient eventually requires >/=2 paracenteses in a 30 day period, candidacy for formal evaluation by the [facility] will be assessed.  Procedure performed by Prudence Brown PA-C and  supervised by Dr. Creasie Doctor  Electronically Signed   By: Creasie Doctor M.D.   On: 07/31/2023 14:37 MR 3D Recon At Scanner CLINICAL DATA:  RUQ abdominal pain, biliary disease suspected, no prior imaging.  EXAM: MRI ABDOMEN WITHOUT AND WITH CONTRAST (INCLUDING MRCP)  TECHNIQUE: Multiplanar multisequence MR imaging of the abdomen was performed both before and after the administration of intravenous contrast. Heavily T2-weighted images of the biliary and pancreatic ducts were obtained, and three-dimensional MRCP images were rendered by post processing.  CONTRAST:  7mL GADAVIST  GADOBUTROL  1 MMOL/ML IV SOLN  COMPARISON:  CT scan abdomen and pelvis from earlier the same day.  FINDINGS: Lower chest: Unremarkable MR appearance to the lung bases. No pleural effusion. No pericardial effusion. Normal heart size. Note is made of moderate sized left posteromedial diaphragmatic hernia containing ascitic fluid.  Hepatobiliary: The liver is normal in size. Noncirrhotic configuration. No discrete focal mass seen. The gallbladder is surgically absent. No intrahepatic bile duct dilation. There is dilated extrahepatic bile duct measuring up to 1.1 cm in diameter and containing a 9 x 9 x 30 mm stone in the distal common bile duct.  Pancreas: No mass, inflammatory changes or other parenchymal abnormality identified. No main pancreatic duct dilation.  Spleen: Mildly enlarged in size measuring 8.2 x 14.3 cm. There are multiple, scattered, sub 5 mm cysts throughout the spleen.  Adrenals/Urinary Tract: Unremarkable adrenal glands. There multiple simple cortical cysts and sinus cysts in bilateral kidneys with largest cortical cyst in the right kidney interpolar region measuring up to 4.3 x 5.5 cm. No hydroureteronephrosis on either side.  Stomach/Bowel: There is moderate hiatal hernia containing proximal portion of the stomach. Visualized portions within the abdomen are unremarkable. No  disproportionate dilation of bowel loops. Scattered colonic diverticula noted without diverticulitis.  Vascular/Lymphatic: No pathologically enlarged lymph nodes identified. No abdominal aortic aneurysm demonstrated. There is moderate to large ascites.  Other:  None.  Musculoskeletal: No suspicious bone lesions identified.  IMPRESSION: 1. There is a 9 x 9 x 30 mm stone in the distal common bile duct. There is resultant extrahepatic bile duct dilation measuring up to 1.1 cm. No intrahepatic bile duct dilation. Surgically absent gallbladder. 2. There is moderate to large ascites. 3. Mild splenomegaly. 4. Multiple other nonacute observations, as described above.  Electronically Signed   By: Beula Brunswick M.D.   On: 07/31/2023 08:27 MR ABDOMEN MRCP W WO CONTAST CLINICAL DATA:  RUQ abdominal pain, biliary disease suspected, no prior imaging.  EXAM: MRI ABDOMEN WITHOUT AND WITH CONTRAST (INCLUDING MRCP)  TECHNIQUE: Multiplanar multisequence MR imaging of the abdomen was performed both before and after the administration of intravenous contrast. Heavily T2-weighted images of the biliary and pancreatic ducts were obtained, and three-dimensional MRCP images were rendered by post processing.  CONTRAST:  7mL GADAVIST  GADOBUTROL  1 MMOL/ML IV SOLN  COMPARISON:  CT scan abdomen and pelvis from earlier the same day.  FINDINGS: Lower chest: Unremarkable MR appearance to the lung bases. No pleural effusion. No pericardial effusion. Normal heart size. Note is made of moderate sized left posteromedial diaphragmatic hernia containing ascitic fluid.  Hepatobiliary: The liver is normal in size. Noncirrhotic configuration. No discrete focal mass seen. The gallbladder is surgically absent. No intrahepatic bile duct dilation. There is dilated extrahepatic bile duct measuring up to 1.1 cm in diameter and containing a 9 x 9 x 30 mm stone in the distal common bile duct.  Pancreas: No mass,  inflammatory changes or other parenchymal abnormality identified. No main pancreatic duct dilation.  Spleen: Mildly enlarged in size measuring 8.2 x 14.3 cm. There are multiple, scattered, sub 5 mm cysts throughout the spleen.  Adrenals/Urinary Tract: Unremarkable adrenal glands. There multiple simple cortical cysts and sinus cysts in bilateral kidneys with largest cortical cyst in the right kidney interpolar region measuring up to 4.3 x 5.5 cm. No hydroureteronephrosis on either side.  Stomach/Bowel: There is moderate hiatal hernia containing proximal portion of the stomach. Visualized portions within the abdomen are unremarkable. No disproportionate dilation of bowel loops. Scattered colonic diverticula noted without diverticulitis.  Vascular/Lymphatic: No pathologically enlarged lymph nodes identified. No abdominal aortic aneurysm demonstrated. There is moderate to large ascites.  Other:  None.  Musculoskeletal: No suspicious bone lesions identified.  IMPRESSION: 1. There is a 9 x 9 x 30 mm stone in the distal common bile duct. There is resultant extrahepatic bile duct dilation measuring up to 1.1 cm. No intrahepatic bile duct dilation. Surgically absent gallbladder. 2. There is moderate to large ascites. 3. Mild splenomegaly. 4. Multiple other nonacute observations, as described above.  Electronically Signed   By: Beula Brunswick M.D.   On: 07/31/2023 08:27 CT Angio Chest Pulmonary Embolism (PE) W or WO Contrast CLINICAL DATA:  Chest pain and suspected pulmonary embolism.  EXAM: CT ANGIOGRAPHY CHEST WITH CONTRAST  TECHNIQUE: Multidetector CT imaging of the chest was performed using the standard protocol during bolus administration of intravenous contrast. Multiplanar CT image reconstructions and MIPs were obtained to evaluate the vascular anatomy.  RADIATION DOSE REDUCTION: This exam was performed according to the departmental dose-optimization program which  includes automated exposure control, adjustment of the mA and/or kV according to patient size and/or use of iterative reconstruction technique.  CONTRAST:  75mL OMNIPAQUE  IOHEXOL  350 MG/ML SOLN  COMPARISON:  Portable chest earlier today, portable chest 07/26/2023 and 07/17/2023, AP and lateral chest 03/22/2023, and CTA chest, abdomen and pelvis 12/03/2022.  FINDINGS: Cardiovascular: The cardiac size is normal. There is no pericardial effusion. The pulmonary veins are nondistended.  Small amount of scattered three-vessel coronary artery atherosclerosis noted plus calcific plaque in the left main coronary artery.  There is no evidence of arterial dilatation, no findings of acute right heart strain.  Arterial opacification is diagnostic. There is a segmental and subsegmental arterial thrombus posteriorly in the right upper lobe on 5: 54-58, and thrombus in the right upper lobe main artery on 5:60, all of which is nonoccluding.  In the right lower lobe there is a nonoccluding thrombus in the main lobar artery and in the anterior basal segmental artery, and in posteromedial basal subsegmental arteries on 5: 68-77.  On the left, there is a small thrombus at the main pulmonary artery bifurcation and a small linear thrombus extending a short distance into lower lobe main artery,  all nonocclusive.  No other arterial emboli are detected. The overall clot burden appears small.  Mediastinum/Nodes: Moderate-to-large sized hiatal hernia containing about 1/4 of the stomach in addition to moderate ascitic fluid tracking up from the abdomen.  Fluid reflux is seen in the esophagus nearly to the thoracic inlet. Aspiration precautions are recommended unless already being done. No esophageal thickening is seen.  There are no enlarged intrathoracic or axillary lymph nodes or thyroid  nodules.  Lungs/Pleura: There are bilateral symmetric minimal layering pleural effusions, new.  Right  hemidiaphragm is increasingly elevated with increased overlying atelectasis in right upper lobe base.  There are posterior linear atelectatic changes in basal lower lobes. Asymmetric perifissural atelectasis on the right.  There is a low inspiration on exam. Patchy mosaicism in the lungs is seen probably due to low lung volumes, alternatively small airways disease. There is no consolidation or pneumothorax.  There is minimal paraseptal emphysematous change in the upper apices.  Upper Abdomen: Moderate to large volume upper abdominal ascites tracks into the hiatal hernia sac.  Status post cholecystectomy. There is trace pneumobilia lobe. No biliary dilatation.  The there are mesenteric congestive changes and body wall anasarca in the abdomen.  No other significant upper abdominal findings. Abdominal aortic atherosclerosis.  CT abdomen and pelvis with contrast is reported separately.  Musculoskeletal: Osteopenia and degenerative change thoracic spine. No acute or significant osseous findings. No chest wall mass is seen.  Review of the MIP images confirms the above findings.  IMPRESSION: 1. Bilateral nonoccluding pulmonary emboli as described above. Overall clot burden appears small. No findings of acute right heart strain. 2. Low inspiration with patchy mosaicism in the lungs probably due to low lung volumes, alternatively small airways disease. 3. Minimal layering pleural effusions. 4. Moderate-to-large sized hiatal hernia containing about 1/4 of the stomach in addition to moderate ascitic fluid tracking up from the abdomen. 5. Fluid reflux is seen in the esophagus nearly to the thoracic inlet. Aspiration precautions are recommended unless already being done. 6. Moderate to large volume upper abdominal ascites, mesenteric congestive changes and body wall anasarca. CT abdomen and pelvis with contrast is reported separately. 7. Aortic and coronary artery  atherosclerosis. 8. Osteopenia and degenerative change. 9. Critical Value/emergent results were called by telephone at the time of interpretation on 07/31/2023 at 1:55 am to provider [facility] , who verbally acknowledged these results.  Aortic Atherosclerosis (ICD10-I70.0) and Emphysema (ICD10-J43.9).  Electronically Signed   By: Denman Fischer M.D.   On: 07/31/2023 02:02 CT ABDOMEN PELVIS W CONTRAST CLINICAL DATA:  Abdominal pain, acute, nonlocalized  EXAM: CT ABDOMEN AND PELVIS WITH CONTRAST  TECHNIQUE: Multidetector CT imaging of the abdomen and pelvis was performed using the standard protocol following bolus administration of intravenous contrast.  RADIATION DOSE REDUCTION: This exam was performed according to the departmental dose-optimization program which includes automated exposure control, adjustment of the mA and/or kV according to patient size and/or use of iterative reconstruction technique.  CONTRAST:  75mL OMNIPAQUE  IOHEXOL  350 MG/ML SOLN  COMPARISON:  CT renal 05/29/2022, CT abdomen pelvis 06/18/2013  FINDINGS: Lower chest: Please see separately dictated CT angiography chest 07/31/2023.  Hepatobiliary: No focal liver abnormality. Status post cholecystectomy. No intrahepatic biliary ductal dilatation. Interval development of common bile duct dilatation measuring up to 9 mm with query associated density along the distal common bile duct (7:70, 3:34).  Pancreas: Diffusely atrophic. No focal lesion. Otherwise normal pancreatic contour. No surrounding inflammatory changes. No main pancreatic ductal dilatation.  Spleen: Normal in  size without focal abnormality.  Adrenals/Urinary Tract:  No adrenal nodule bilaterally.  Bilateral kidneys enhance symmetrically. Fluid density lesion of the right kidney likely represents a simple renal cyst. Simple renal cysts, in the absence of clinically indicated signs/symptoms, require no independent follow-up.  No  hydronephrosis. No hydroureter.  The urinary bladder is unremarkable.  Stomach/Bowel: Large hiatal hernia. Stomach is within normal limits. No evidence of bowel wall thickening or dilatation. The appendix is not definitely identified with no inflammatory changes in the right lower quadrant to suggest acute appendicitis.  Vascular/Lymphatic: No abdominal aorta or iliac aneurysm. Severe atherosclerotic plaque of the aorta and its branches. No abdominal, pelvic, or inguinal lymphadenopathy.  Reproductive: Uterus and bilateral adnexa are unremarkable.  Other: Moderate volume intraperitoneal free fluid. No intraperitoneal free gas. No organized fluid collection.  Musculoskeletal:  Subcutaneus soft tissue edema. No abdominal wall hernia or abnormality.  No suspicious lytic or blastic osseous lesions. No acute displaced fracture. Multilevel degenerative changes of the spine.  IMPRESSION: 1. Interval development of common bile duct dilatation measuring up to 9 mm with query associated density along the distal common bile duct. Correlate with liver function tests and if clinically indicated consider MRI for further evaluation. 2. Status post cholecystectomy. 3. Moderate volume intraperitoneal free fluid. 4. Colonic diverticulosis with no acute diverticulitis. 5.  Aortic Atherosclerosis (ICD10-I70.0). 6. Please see separately dictated CT angiography chest 07/31/2023.  Electronically Signed   By: Morgane  Naveau M.D.   On: 07/31/2023 01:34 DG Chest Port 1 View CLINICAL DATA:  Chest pain  EXAM: PORTABLE CHEST 1 VIEW  COMPARISON:  None Available.  FINDINGS: Lung volumes are small and there is bibasilar atelectasis. Lungs otherwise clear. No pneumothorax or pleural effusion. Cardiac size within limits. Moderate hiatal hernia. Pulmonary vascularity is normal. No acute.  IMPRESSION: 1. Pulmonary hypoinflation. 2. Moderate hiatal hernia.  Electronically Signed   By: Worthy Heads M.D.   On: 07/31/2023 01:02     Scheduled inpatient medications:   mesalamine   1.2 g Oral Q breakfast   nystatin  cream   Topical BID   pantoprazole   40 mg Oral BID   Continuous inpatient infusions:   heparin  850 Units/hr (08/02/23 0913)   PRN inpatient medications:   Vital signs in last 24 hours: Temp:  [97.4 F (36.3 C)-97.9 F (36.6 C)] 97.7 F (36.5 C) (05/02 1136) Pulse Rate:  [70-81] 70 (05/02 1136) Resp:  [17-24] 19 (05/02 1136) BP: (109-125)/(56-65) 111/62 (05/02 1136) SpO2:  [94 %-97 %] 97 % (05/02 0714) Weight:  [65.5 kg] 65.5 kg (05/02 0512) Last BM Date : 08/02/23  Intake/Output Summary (Last 24 hours) at 08/02/2023 1409 Last data filed at 08/02/2023 1234 Gross per 24 hour  Intake 870.67 ml  Output --  Net 870.67 ml    Intake/Output from previous day: 05/01 0701 - 05/02 0700 In: 723.7 [P.O.:650; I.V.:73.7] Out: -  Intake/Output this shift: Total I/O In: 427 [P.O.:427] Out: -    Physical Exam:  General: Alert female in NAD Heart:  Regular rate   Pulmonary: Normal respiratory effort Abdomen: Soft, mildly distended, nontender. Normal bowel sounds. Extremities: BLE pitting edema Neurologic: Alert and oriented Psych: Pleasant. [employer]operative     LOS: 2 days   Mai Schwalbe ,NP 08/02/2023, 2:09 PM

## 2023-08-02 NOTE — Assessment & Plan Note (Addendum)
 Very well-appearing this morning, with no increased work of breathing, on RA. VSS overnight. Low suspicion for failure of Eliquis  - will not plan to load Eliquis  prior to restarting. - Heparin  per pharmacy, fine to hold in the short term for ERCP as below - transition to home Eliquis  5 mg BID when indicated - Continuous cardiac monitoring - Daily APTT, heparin  level  - AM CBC, CMP

## 2023-08-02 NOTE — Progress Notes (Signed)
 PHARMACY - ANTICOAGULATION CONSULT NOTE  Pharmacy Consult for IV heparin  Indication: pulmonary embolus and DVT  No Known Allergies  Patient Measurements: Height: 5\' 3"  (160 cm) Weight: 65.5 kg (144 lb 6.4 oz) IBW/kg (Calculated) : 52.4 HEPARIN  DW (KG): 67.4  Vital Signs: Temp: 97.7 F (36.5 C) (05/02 1136) Temp Source: Oral (05/02 1136) BP: 111/62 (05/02 1136) Pulse Rate: 70 (05/02 1136)  Labs: Recent Labs    07/31/23 0034 07/31/23 0337 07/31/23 0401 07/31/23 0401 07/31/23 1540 08/01/23 0220 08/01/23 1201 08/01/23 1307 08/01/23 2102 08/02/23 0959  HGB 8.6*  --  9.0*  --   --  7.4* 8.5*  --   --  9.1*  HCT 27.8*  --  29.7*  --   --  23.5* 27.4*  --   --  29.4*  PLT 184  --  170  --   --  147* 151  --   --  160  APTT  --   --   --    < > 86* 133*  --  75* 89* 71*  HEPARINUNFRC  --   --   --   --  >1.10* 1.00*  --   --   --  0.43  CREATININE 0.99  --  0.99  --   --  0.96  --   --   --  1.04*  TROPONINIHS 4 6  --   --   --   --   --   --   --   --    < > = values in this interval not displayed.    Estimated Creatinine Clearance: 39.9 mL/min (A) (by C-G formula based on SCr of 1.04 mg/dL (H)).  Assessment: 67 yof admitted with chest and abdominal pain and SOB found to have bilateral PE with small clot burden on CTA chest. Of note, patient had recent new DVT (07/17/23) and was started on apixaban  which was given at [facility]. Suspect this is not an apixaban  failure given recent DVT. She had a GIB and LA grade D reflux esophagitis in 06/2023. Pharmacy consulted to begin IV heparin .  aPTT is therapeutic at 71 sec on 900 units/hr. Heparin  level is also therapeutic at 0.43. No overt bleeding noted, Hgb stable 8-9s, platelets are normal. aPTT and heparin  levels are correlating so will monitor heparin  level going forward.  Goal of Therapy:  Heparin  level 0.3-0.5 units/ml aPTT 66-85 seconds Monitor platelets by anticoagulation protocol: Yes   Plan:  Continue heparin  infusion at 850  units/hr (no bolus) Daily heparin  level, CBC Monitor for s/sx of bleeding F/u ability to resume apixaban    Thank you for involving pharmacy in this patient's care.  Caroline Cinnamon, PharmD, BCPS Clinical Pharmacist Clinical phone for 08/02/2023 is [phone] 08/02/2023 2:03 PM '

## 2023-08-02 NOTE — Progress Notes (Signed)
 Daily Progress Note Intern Pager: [phone]  Patient name: Diane Freeman Medical record number: [id number] Date of birth: 10/25/1945 Age: 78 y.o. Gender: female  Primary Care Provider: Sarahann Cumins, DO Consultants: GI Code Status: DNR  Pt Overview and Major Events to Date:  4/30 - admitted; paracentesis w 3.7 L out   Assessment and Plan: Diane Freeman is a 78 y.o. female with PMH dementia, GERD esophagitis, Crohn's disease on mesalamine , recent DVT on Eliquis , HFpEF, HLD, PAF, h/o recent sepsis and Ecoli bacteremia with unknown source who presented for new onset SOB and was found to have PE.  Also with new onset ascites being followed by GI, planning for ERCP after heparin  washout. Assessment & Plan Pulmonary emboli (HCC) Very well-appearing this morning, with no increased work of breathing, on RA. VSS overnight. Low suspicion for failure of Eliquis  - will not plan to load Eliquis  prior to restarting. - Heparin  per pharmacy, fine to hold in the short term for ERCP as below - transition to home Eliquis  5 mg BID when indicated - Continuous cardiac monitoring - Daily APTT, heparin  level  - AM CBC, CMP Dilated bile duct  Ascites Bili somewhat firmer and more distended this morning on exam - suspect fluid being reaccumulating.  Our team spoke with sister yesterday who has primary decision maker for this patient given her dementia; she desired further inpatient workup including ERCP, as it would be very difficult for the patient to obtain this in the outpatient setting.  We will discuss with GI and are happy to facilitate on our end -- Patient will need heparin  washout prior to this. --Will obtain ultrasound to evaluate ascites; patient may need more fluid taken off -- GI consulted, appreciate recommendations -- probably needs ERCP, family would prefer to have this done in the inpatient setting -- AM CMP  Normocytic anemia Hgb stable this AM, 9.1. Will continue to monitor  closely in the setting of anticoagulation. -- Appreciate GI recs  -- Cont home protonix  40 BID -- AM CBC -- transfuse <7 Diastolic CHF (HCC) Bilateral pitting edema somewhat improved this AM. Not on GDMT nor diuretics at home. Last echo in Mar 2025 with EF 60-65%, G1DD; repeat echo here unchanged.  Body wall anasarca and mesenteric congestion on CT A&P.  May need additional diuretics for ascites, appreciate GI recommendations. -- AM CBC, CMP Chronic health problem Crohn's disease: Continue mesalamine  1.2 g daily Paroxysmal atrial fibrillation: Heart rate controlled on admission. Does not take rate control agents. Holding home eliquis  in setting of heparin  drip for PE.   FEN/GI: Heart healthy diet PPx: Heparin  Dispo:SNF  pending further workup with ERCP . Barriers include will lose her bed at SNF if she stays longer, will need re-placement.   Subjective:  Patient seen this morning sitting up in bed.  She has no complaints.  Reports she thinks she ate too much breakfast as her belly feels "tight."  She is agreeable with staying in the hospital for ERCP.  Objective: Temp:  [97.4 F (36.3 C)-97.9 F (36.6 C)] 97.7 F (36.5 C) (05/02 1136) Pulse Rate:  [70-81] 70 (05/02 1136) Resp:  [17-24] 19 (05/02 1136) BP: (109-125)/(56-65) 111/62 (05/02 1136) SpO2:  [94 %-97 %] 97 % (05/02 0714) Weight:  [65.5 kg] 65.5 kg (05/02 0512) Physical Exam: General: Well-appearing, sitting up in bed, NAD. Cardiovascular: RRR Respiratory: CTA anteriorly, normal work of breathing on room air. Abdomen: Normoactive bowel sounds, soft, nontender, somewhat distended. Extremities: Bilateral 1+ pitting  edema, improved from my exam yesterday.  Moves all equally.  Laboratory: Most recent CBC Lab Results  Component Value Date   WBC 4.0 08/02/2023   HGB 9.1 (L) 08/02/2023   HCT 29.4 (L) 08/02/2023   MCV 86.2 08/02/2023   PLT 160 08/02/2023   Most recent BMP    Latest Ref Rng & Units 08/02/2023    9:59 AM   BMP  Glucose 70 - 99 mg/dL 95   BUN 8 - 23 mg/dL 10   Creatinine 8.11 - 1.00 mg/dL 9.14   Sodium 782 - 956 mmol/L 135   Potassium 3.5 - 5.1 mmol/L 3.7   Chloride 98 - 111 mmol/L 105   CO2 22 - 32 mmol/L 20   Calcium  8.9 - 10.3 mg/dL 7.9     Omar Bibber, DO 08/02/2023, 12:46 PM  PGY-1, [facility] FPTS Intern pager: [phone], text pages welcome Secure chat group [facility] Teaching Service

## 2023-08-02 NOTE — Evaluation (Signed)
 Physical Therapy Evaluation Patient Details Name: Diane Freeman MRN: [id number] DOB: Mar 02, 1946 Today's Date: 08/02/2023  History of Present Illness  Diane Freeman is a 78 y.o. female admitted 07/31/23 for SOB and chest/abdominal pain. CTPE demonstrated B PE, overall small clot burden. New ascites and CBD dilatation found on imaging. Pt underwent paracentesis which removed 3.7 L. PMH of dementia, GERD esophagitis, Crohn's disease on mesalamine , recent DVT on Eliquis , HFpEF, HLD, PAF, h/o recent sepsis and Ecoli bacteremia with unknown source.   Clinical Impression  Pt admitted with above diagnosis. PTA, pt was at [facility] and doesn't recall her time there. She is a  questionable historian secondary to her hx of dementia. Pt was previously independent with functional mobility, ADLs/IADLs, and driving. She was living alone in a mobile home with 3 STE and BHR. Pt currently with functional limitations due to the deficits listed below (see PT Problem List). She required minA to stand from recliner chair, modA to stand from commode, and CGA during gait using RW. Pt desaturated during functional mobility on RA to 82% SpO2. Donned 4L O2 via [place] with SpO2 briefly >88%, RN notified as pleth reading was unreliable. Educated pt on incentive spirometer and instructed her to complete 10 reps every hour. Pt will benefit from acute skilled PT to increase her independence and safety with mobility to allow discharge home with HHPT and increased family support.    If plan is discharge home, recommend the following: A little help with bathing/dressing/bathroom;Assistance with cooking/housework;Assist for transportation;Help with stairs or ramp for entrance;A lot of help with walking and/or transfers   Can travel by private vehicle   Yes    Equipment Recommendations None recommended by PT (Pt already has DME)  Recommendations for Other Services       Functional Status Assessment Patient has had a recent  decline in their functional status and demonstrates the ability to make significant improvements in function in a reasonable and predictable amount of time.     Precautions / Restrictions Precautions Precautions: Fall Recall of Precautions/Restrictions: Impaired Restrictions Weight Bearing Restrictions Per Provider Order: No      Mobility  Bed Mobility               General bed mobility comments: Not assessed. Pt greeted seated in recliner chair and returned there at end of session.    Transfers Overall transfer level: Needs assistance Equipment used: None Transfers: Sit to/from Stand Sit to Stand: Contact guard assist, Mod assist           General transfer comment: Pt stood from recliner chair by pushing up with BUE support and minA to power up. Pt stood from commode with modA to power up. She took incresaed time to achieve upright. Good eccentric control with sitting.    Ambulation/Gait Ambulation/Gait assistance: Contact guard assist Gait Distance (Feet): 125 Feet (x2, standing rest in between bouts) Assistive device: None, Rolling walker (2 wheels) Gait Pattern/deviations: Step-through pattern, Decreased stride length Gait velocity: reduced Gait velocity interpretation: <1.8 ft/sec, indicate of risk for recurrent falls   General Gait Details: Pt ambulated within the room without AD. She was reaching for furniture for support while heading towards the bathroom, HHA provided. Introduced RW and pt demonstrated good sequencing of the AD. She maintained body inside RW at all times. PT facilitated standing rest breaks as pt appeared to fatigue. (Pt with 2/3 dyspnea, SpO2 93% and above on RA with questionable waveform while holding RW but [facility] once pt  letting go of RW handle with that hand.)  Stairs            Wheelchair Mobility     Tilt Bed    Modified Rankin (Stroke Patients Only)       Balance Overall balance assessment: Needs  assistance Sitting-balance support: Feet supported, No upper extremity supported Sitting balance-Leahy Scale: Good Sitting balance - Comments: Pt scooted on recliner chair with BUE support. She addressed pericare on commode, reaching outside her BOS without LOB.   Standing balance support: During functional activity, No upper extremity supported, Bilateral upper extremity supported Standing balance-Leahy Scale: Fair Standing balance comment: Pt was unsteady ambulating within the room without an AD. She was able to pull down/up her panties when using the bathroom and wash her hands at sink without LOB. Pt's stability improved with introduction of RW.                             Pertinent Vitals/Pain Pain Assessment Pain Assessment: No/denies pain    Home Living Family/patient expects to be discharged to:: Private residence Living Arrangements: Alone Available Help at Discharge: Family;Available PRN/intermittently (Pt reports she "ain't never by myself I don't think." Her sister has talked about moving in with her.) Type of Home: Mobile home Home Access: Stairs to enter Entrance Stairs-Rails: Right;Left;Can reach both Entrance Stairs-Number of Steps: 3   Home Layout: One level Home Equipment: Agricultural consultant (2 wheels);Cane - single point;Toilet riser      Prior Function Prior Level of Function : Independent/Modified Independent;Driving;Patient poor historian/Family not available             Mobility Comments: Ambulates without AD. Denies falls. ADLs Comments: Indep with ADL/IADL     Extremity/Trunk Assessment   Upper Extremity Assessment Upper Extremity Assessment: Defer to OT evaluation    Lower Extremity Assessment Lower Extremity Assessment: Overall WFL for tasks assessed    Cervical / Trunk Assessment Cervical / Trunk Assessment: Normal  Communication   Communication Communication: No apparent difficulties    Cognition Arousal: Alert Behavior  During Therapy: WFL for tasks assessed/performed   PT - Cognitive impairments: History of cognitive impairments, Orientation, Safety/Judgement, Awareness (Dementia)   Orientation impairments: Time, Situation (Pt reported the month as April and year as 2024. Doesn't remeber how she ended up here in the hospital or her time at [facility].)                   PT - Cognition Comments: Pt is a questionable historian. She demonstrated poor awareness of her fatigue setting in. PT facilitate standing rest breaks and cued PLB technqiue throughout mobility. Following commands: Intact       Cueing Cueing Techniques: Verbal cues     General Comments General comments (skin integrity, edema, etc.): Pt greeted on RA. Her pleth reading was difficult to obtain. During gait pulse oximeter determined pt's SpO2 80-83%. Re-assessed once seated and pt maintained in low 80s. Donned  which was already set at 4L. Pt had brief periods where SpO2 was 89-90s. RN notified.    Exercises Other Exercises Other Exercises: Incentive Spirometer x10 reps. (Educated pt on proper technique. She demonstrated ability to reach consistently. Instructed pt to complete 10 reps every hour aiming for 1,286mL.)   Assessment/Plan    PT Assessment Patient needs continued PT services  PT Problem List Cardiopulmonary status limiting activity;Decreased activity tolerance;Decreased balance;Decreased mobility;Decreased knowledge of use of DME;Decreased safety awareness  PT Treatment Interventions DME instruction;Gait training;Stair training;Functional mobility training;Therapeutic activities;Therapeutic exercise;Balance training;Neuromuscular re-education;Patient/family education    PT Goals (Current goals can be found in the Care Plan section)  Acute Rehab PT Goals Patient Stated Goal: Return Home PT Goal Formulation: With patient Time For Goal Achievement: 08/16/23 Potential to Achieve Goals: Good     Frequency Min 2X/week     Co-evaluation               AM-PAC PT "6 Clicks" Mobility  Outcome Measure Help needed turning from your back to your side while in a flat bed without using bedrails?: A Little Help needed moving from lying on your back to sitting on the side of a flat bed without using bedrails?: A Little Help needed moving to and from a bed to a chair (including a wheelchair)?: A Little Help needed standing up from a chair using your arms (e.g., wheelchair or bedside chair)?: A Lot Help needed to walk in hospital room?: A Little Help needed climbing 3-5 steps with a railing? : A Lot 6 Click Score: 16    End of Session Equipment Utilized During Treatment: Gait belt;Oxygen Activity Tolerance: Patient tolerated treatment well Patient left: in chair;with call bell/phone within reach;with chair alarm set Nurse Communication: Mobility status;Other (comment) (desaturation and need for new SpO2 monitor) PT Visit Diagnosis: Unsteadiness on feet (R26.81);Other abnormalities of gait and mobility (R26.89);Difficulty in walking, not elsewhere classified (R26.2)    Time: 1308-6578 PT Time Calculation (min) (ACUTE ONLY): 33 min   Charges:   PT Evaluation $PT Eval Moderate Complexity: 1 Mod   PT General Charges $$ ACUTE PT VISIT: 1 Visit         Glenford Lanes, PT, DPT Acute Rehabilitation Services Office: [phone] Secure Chat Preferred  Riva Chester 08/02/2023, 12:04 PM

## 2023-08-02 NOTE — Assessment & Plan Note (Addendum)
 Bilateral pitting edema somewhat improved this AM. Not on GDMT nor diuretics at home. Last echo in Mar 2025 with EF 60-65%, G1DD; repeat echo here unchanged.  Body wall anasarca and mesenteric congestion on CT A&P.  May need additional diuretics for ascites, appreciate GI recommendations. -- AM CBC, CMP

## 2023-08-02 NOTE — Evaluation (Signed)
 Occupational Therapy Evaluation Patient Details Name: Diane Freeman MRN: [id number] DOB: 16-Jul-1945 Today's Date: 08/02/2023   History of Present Illness   Diane Freeman is a 78 y.o. female admitted 07/31/23 for SOB and chest/abdominal pain. CTPE demonstrated B PE, overall small clot burden. New ascites and CBD dilatation found on imaging. Pt underwent paracentesis which removed 3.7 L. PMH of dementia, GERD esophagitis, Crohn's disease on mesalamine , recent DVT on Eliquis , HFpEF, HLD, PAF, h/o recent sepsis and Ecoli bacteremia with unknown source.     Clinical Impressions Pt was using a RW with CGA when admitted in April. Prior to that admission, she was living alone and independent. Pt has been at [facility] for rehab. She is a poor historian and disoriented to time and situation. Pt presents with generalized weakness, impaired cognition with poor awareness of safety and deficits and decreased balance which improved greatly with use of RW. She needs CGA from recliner to stand and moderate assistance from lower height of toilet. Pt requires set up to CGA for ADLs from a moderate height surface. SpO2 noted to drop to 83% on RA with hallway ambulation. Pt reports fatigue, needs cues for standing rest break. Replaced 4L upon return to chair,  with inconsistent pleth, but greater than 88%. RN made aware. Patient will benefit from continued inpatient follow up therapy, <3 hours/day.     If plan is discharge home, recommend the following:   A little help with walking and/or transfers;A little help with bathing/dressing/bathroom;Assistance with cooking/housework;Assist for transportation;Help with stairs or ramp for entrance;Direct supervision/assist for financial management;Direct supervision/assist for medications management     Functional Status Assessment   Patient has had a recent decline in their functional status and/or demonstrates limited ability to make significant  improvements in function in a reasonable and predictable amount of time     Equipment Recommendations   Other (comment) (defer to next venue)     Recommendations for Other Services         Precautions/Restrictions   Precautions Precautions: Fall Recall of Precautions/Restrictions: Impaired Restrictions Weight Bearing Restrictions Per Provider Order: No     Mobility Bed Mobility               General bed mobility comments: in chair    Transfers Overall transfer level: Needs assistance Equipment used: None Transfers: Sit to/from Stand Sit to Stand: Contact guard assist, Mod assist           General transfer comment: CGA from recliner using arms of chair to push up, immediately reaching for stability in standing, moderate assist to stand from lower toilet      Balance Overall balance assessment: Needs assistance   Sitting balance-Leahy Scale: Good     Standing balance support: During functional activity, No upper extremity supported, Bilateral upper extremity supported Standing balance-Leahy Scale: Fair Standing balance comment: fair static, poor dynamic                           ADL either performed or assessed with clinical judgement   ADL Overall ADL's : Needs assistance/impaired Eating/Feeding: Independent;Sitting   Grooming: Wash/dry hands;Standing;Contact guard assist   Upper Body Bathing: Supervision/ safety;Sitting   Lower Body Bathing: Contact guard assist;Sit to/from stand   Upper Body Dressing : Minimal assistance;Standing   Lower Body Dressing: Set up;Sitting/lateral leans   Toilet Transfer: Ambulation;Moderate assistance;Regular Toilet   Toileting- Clothing Manipulation and Hygiene: Minimal assistance;Sit to/from stand  Functional mobility during ADLs: Contact guard assist;Rolling walker (2 wheels);Minimal assistance       Vision Ability to See in Adequate Light: 0 Adequate Patient Visual Report: No  change from baseline Vision Assessment?: No apparent visual deficits     Perception         Praxis         Pertinent Vitals/Pain Pain Assessment Pain Assessment: No/denies pain     Extremity/Trunk Assessment Upper Extremity Assessment Upper Extremity Assessment: Overall WFL for tasks assessed   Lower Extremity Assessment Lower Extremity Assessment: Defer to PT evaluation   Cervical / Trunk Assessment Cervical / Trunk Assessment: Kyphotic   Communication Communication Communication: No apparent difficulties   Cognition Arousal: Alert Behavior During Therapy: WFL for tasks assessed/performed Cognition: No family/caregiver present to determine baseline             OT - Cognition Comments: pt disoriented to time and situation, decreased historian, history of dementia                 Following commands: Intact       Cueing  General Comments   Cueing Techniques: Verbal cues  Pt greeted on RA. Her pleth reading was difficult to obtain. During gait pulse oximeter determined pt's SpO2 80-83%. Re-assessed once seated and pt maintained in low 80s. [place]ned [place] which was already set at 4L. Pt had brief periods where SpO2 was 89-90s. RN notified.   Exercises     Shoulder Instructions      Home Living Family/patient expects to be discharged to:: Skilled nursing facility Living Arrangements: Alone Available Help at Discharge: Family;Available PRN/intermittently (Pt reports she "ain't never by myself I don't think." Her sister has talked about moving in with her.) Type of Home: Mobile home Home Access: Stairs to enter Entrance Stairs-Number of Steps: 3 Entrance Stairs-Rails: Right;Left;Can reach both Home Layout: One level     Bathroom Shower/Tub: Tub only;Walk-in shower         Home Equipment: Agricultural consultant (2 wheels);Cane - single point;Toilet riser   Additional Comments: pt has been at [facility] since admission in April, she does not have 24  hour care at home per previous chart entry      Prior Functioning/Environment Prior Level of Function : Independent/Modified Independent             Mobility Comments: was walking with RW last admission, reports prior to that she was independent ADLs Comments: pt was needing set up to [facility] for ADLs last admission, prior was independent and living alone    OT Problem List: Decreased strength;Decreased activity tolerance;Impaired balance (sitting and/or standing);Decreased safety awareness;Cardiopulmonary status limiting activity;Decreased cognition;Other (comment) (decreased activity tolerance)   OT Treatment/Interventions: Self-care/ADL training;DME and/or AE instruction      OT Goals(Current goals can be found in the care plan section)   Acute Rehab OT Goals OT Goal Formulation: With patient Time For Goal Achievement: 08/16/23 Potential to Achieve Goals: Good ADL Goals Pt Will Perform Grooming: with supervision;standing Pt Will Perform Lower Body Bathing: with supervision;sit to/from stand Pt Will Perform Lower Body Dressing: with supervision;sit to/from stand Pt Will Transfer to Toilet: with supervision;ambulating;bedside commode (over toilet) Pt Will Perform Toileting - Clothing Manipulation and hygiene: with supervision;sit to/from stand   OT Frequency:  Min 1X/week    Co-evaluation              AM-PAC OT "6 Clicks" Daily Activity     Outcome Measure Help from another person eating  meals?: None Help from another person taking care of personal grooming?: A Little Help from another person toileting, which includes using toliet, bedpan, or urinal?: A Little Help from another person bathing (including washing, rinsing, drying)?: A Little Help from another person to put on and taking off regular upper body clothing?: A Little Help from another person to put on and taking off regular lower body clothing?: A Little 6 Click Score: 19   End of Session Equipment  Utilized During Treatment: Rolling walker (2 wheels);Oxygen;Gait belt (4L at end of session)  Activity Tolerance: Patient tolerated treatment well Patient left: in chair;with call bell/phone within reach  OT Visit Diagnosis: Unsteadiness on feet (R26.81);Other abnormalities of gait and mobility (R26.89);Muscle weakness (generalized) (M62.81);Other symptoms and signs involving cognitive function                Time: 1610-9604 OT Time Calculation (min): 25 min Charges:  OT General Charges $OT Visit: 1 Visit OT Evaluation $OT Eval Moderate Complexity: 1 Mod  Avanell Leigh, OTR/L Acute Rehabilitation Services Office: [phone]   Jonette Nestle 08/02/2023, 1:18 PM

## 2023-08-02 NOTE — Assessment & Plan Note (Deleted)
***    Resolved this morning, K 3.8. - AM CMP to trend - Replete as indicated

## 2023-08-02 NOTE — Assessment & Plan Note (Addendum)
 Crohn's disease: Continue mesalamine  1.2 g daily Paroxysmal atrial fibrillation: Heart rate controlled on admission. Does not take rate control agents. Holding home eliquis  in setting of heparin  drip for PE.

## 2023-08-02 NOTE — Assessment & Plan Note (Addendum)
 Hgb stable this AM, 9.1. Will continue to monitor closely in the setting of anticoagulation. -- Appreciate GI recs  -- Cont home protonix  40 BID -- AM CBC -- transfuse <7

## 2023-08-02 NOTE — Progress Notes (Signed)
 Mobility Specialist Progress Note:   08/02/23 0930  Mobility  Activity Ambulated with assistance in hallway  Level of Assistance Contact guard assist, steadying assist  Assistive Device Other (Comment) (IV pole)  Distance Ambulated (ft) 125 ft  Activity Response Tolerated well  Mobility Referral Yes  Mobility visit 1 Mobility  Mobility Specialist Start Time (ACUTE ONLY) 0930  Mobility Specialist Stop Time (ACUTE ONLY) 0940  Mobility Specialist Time Calculation (min) (ACUTE ONLY) 10 min   Pt agreeable to mobility session. Required contact assist for ambulation with IV pole. Pt c/o BLE weakness, back in chair with all needs met.   Oneda Big Mobility Specialist Please contact via SecureChat or  Rehab office at [phone]

## 2023-08-02 NOTE — Assessment & Plan Note (Addendum)
 Bili somewhat firmer and more distended this morning on exam - suspect fluid being reaccumulating.  Our team spoke with sister yesterday who has primary decision maker for this patient given her dementia; she desired further inpatient workup including ERCP, as it would be very difficult for the patient to obtain this in the outpatient setting.  We will discuss with GI and are happy to facilitate on our end -- Patient will need heparin  washout prior to this. --Will obtain ultrasound to evaluate ascites; patient may need more fluid taken off -- GI consulted, appreciate recommendations -- probably needs ERCP, family would prefer to have this done in the inpatient setting -- AM CMP

## 2023-08-03 DIAGNOSIS — I2694 Multiple subsegmental pulmonary emboli without acute cor pulmonale: Secondary | ICD-10-CM | POA: Diagnosis not present

## 2023-08-03 DIAGNOSIS — R188 Other ascites: Secondary | ICD-10-CM | POA: Diagnosis not present

## 2023-08-03 DIAGNOSIS — K838 Other specified diseases of biliary tract: Secondary | ICD-10-CM | POA: Diagnosis not present

## 2023-08-03 LAB — CBC
HCT: 26.4 % — ABNORMAL LOW (ref 36.0–46.0)
HCT: 26.7 % — ABNORMAL LOW (ref 36.0–46.0)
Hemoglobin: 8.3 g/dL — ABNORMAL LOW (ref 12.0–15.0)
Hemoglobin: 8.5 g/dL — ABNORMAL LOW (ref 12.0–15.0)
MCH: 26.4 pg (ref 26.0–34.0)
MCH: 27 pg (ref 26.0–34.0)
MCHC: 31.4 g/dL (ref 30.0–36.0)
MCHC: 31.8 g/dL (ref 30.0–36.0)
MCV: 84.1 fL (ref 80.0–100.0)
MCV: 84.8 fL (ref 80.0–100.0)
Platelets: 138 10*3/uL — ABNORMAL LOW (ref 150–400)
Platelets: 144 10*3/uL — ABNORMAL LOW (ref 150–400)
RBC: 3.14 MIL/uL — ABNORMAL LOW (ref 3.87–5.11)
RBC: 3.15 MIL/uL — ABNORMAL LOW (ref 3.87–5.11)
RDW: 17.1 % — ABNORMAL HIGH (ref 11.5–15.5)
RDW: 17.2 % — ABNORMAL HIGH (ref 11.5–15.5)
WBC: 2.9 10*3/uL — ABNORMAL LOW (ref 4.0–10.5)
WBC: 4.1 10*3/uL (ref 4.0–10.5)
nRBC: 0 % (ref 0.0–0.2)
nRBC: 0 % (ref 0.0–0.2)

## 2023-08-03 LAB — BODY FLUID CULTURE W GRAM STAIN
Culture: NO GROWTH
Gram Stain: NONE SEEN

## 2023-08-03 LAB — BASIC METABOLIC PANEL WITH GFR
Anion gap: 9 (ref 5–15)
BUN: 11 mg/dL (ref 8–23)
CO2: 22 mmol/L (ref 22–32)
Calcium: 7.8 mg/dL — ABNORMAL LOW (ref 8.9–10.3)
Chloride: 103 mmol/L (ref 98–111)
Creatinine, Ser: 1.18 mg/dL — ABNORMAL HIGH (ref 0.44–1.00)
GFR, Estimated: 47 mL/min — ABNORMAL LOW (ref >60)
Glucose, Bld: 91 mg/dL (ref 70–99)
Potassium: 3.6 mmol/L (ref 3.5–5.1)
Sodium: 134 mmol/L — ABNORMAL LOW (ref 135–145)

## 2023-08-03 LAB — HEPARIN LEVEL (UNFRACTIONATED)
Heparin Unfractionated: 0.26 [IU]/mL — ABNORMAL LOW (ref 0.30–0.70)
Heparin Unfractionated: 0.26 [IU]/mL — ABNORMAL LOW (ref 0.30–0.70)

## 2023-08-03 NOTE — Progress Notes (Signed)
 PHARMACY - ANTICOAGULATION CONSULT NOTE  Pharmacy Consult for IV heparin  Indication: pulmonary embolus and DVT  No Known Allergies  Patient Measurements: Height: 5\' 3"  (160 cm) Weight: 64.7 kg (142 lb 10.2 oz) IBW/kg (Calculated) : 52.4 HEPARIN  DW (KG): 67.4  Vital Signs: Temp: 97.7 F (36.5 C) (05/03 1640) Temp Source: Oral (05/03 1640) BP: 104/52 (05/03 1640) Pulse Rate: 78 (05/03 1640)  Labs: Recent Labs    08/01/23 0220 08/01/23 1201 08/01/23 1307 08/01/23 2102 08/02/23 0959 08/03/23 0245 08/03/23 1100 08/03/23 1547  HGB 7.4*   < >  --   --  9.1* 8.3*  --  8.5*  HCT 23.5*   < >  --   --  29.4* 26.4*  --  26.7*  PLT 147*   < >  --   --  160 138*  --  144*  APTT 133*  --  75* 89* 71*  --   --   --   HEPARINUNFRC 1.00*  --   --   --  0.43 0.26*  --  0.26*  CREATININE 0.96  --   --   --  1.04*  --  1.18*  --    < > = values in this interval not displayed.    Estimated Creatinine Clearance: 35 mL/min (A) (by C-G formula based on SCr of 1.18 mg/dL (H)).  Assessment: 78 yo F admitted with chest and abdominal pain and SOB found to have bilateral PE with small clot burden on CTA chest. Of note, patient had recent new DVT (07/17/23) and was started on apixaban  which was given at [facility]. Suspect this is not an apixaban  failure given recent DVT. She had a GIB and LA grade D reflux esophagitis in 06/2023. Pharmacy consulted to begin IV heparin .  Heparin  level 0.26 remains subtherapeutic with heparin  running at 900 units/hr. Hgb (8.3) and PLTs (138) are stable. Per RN, no report of pauses, issues with the line, or signs of bleeding.    Goal of Therapy:  Heparin  level 0.3-0.5 units/ml aPTT 66-85 seconds Monitor platelets by anticoagulation protocol: Yes   Plan:  Increase heparin  infusion to 950 units/hr Daily heparin  level, CBC Monitor for s/sx of bleeding F/u ability to resume apixaban    Thank you for involving pharmacy in this patient's care.  [employer],  Pharm.D., BCPS Clinical Pharmacist  **Pharmacist phone directory can be found on [email or web address] listed under [facility].  08/03/2023 6:00 PM

## 2023-08-03 NOTE — Assessment & Plan Note (Addendum)
 Patient switched to DNR status this admission.  Plan to continue goals of care discussion.  Consider consulting palliative care.

## 2023-08-03 NOTE — Progress Notes (Addendum)
 Mobility Specialist Progress Note:   08/03/23 0950  Mobility  Activity Ambulated with assistance in hallway  Level of Assistance Contact guard assist, steadying assist  Assistive Device Other (Comment) (IV pole)  Distance Ambulated (ft) 125 ft  Activity Response Tolerated well  Mobility Referral Yes  Mobility visit 1 Mobility  Mobility Specialist Start Time (ACUTE ONLY) 0950  Mobility Specialist Stop Time (ACUTE ONLY) 1005  Mobility Specialist Time Calculation (min) (ACUTE ONLY) 15 min   Pt agreeable to mobility session. Required contact assist with ambulation with IV pole use. Attempted ambulation on RA, pt desat to 82%. 4LO2 required to maintain SpO2 WFL (92%). Pt back in chair with all needs met, left on RA with SpO2 96%. Son present.    Diane Freeman Mobility Specialist Please contact via SecureChat or  Rehab office at [phone]

## 2023-08-03 NOTE — Assessment & Plan Note (Addendum)
 Bilateral pitting edema somewhat improved this AM. Not on GDMT nor diuretics at home. Last echo in Mar 2025 with EF 60-65%, G1DD; repeat echo here unchanged.  Body wall anasarca and mesenteric congestion on CT A&P.  May need additional diuretics for ascites, appreciate GI recommendations. -- AM CBC, CMP

## 2023-08-03 NOTE — Assessment & Plan Note (Addendum)
 Hgb 9.1->8.3, on heparin  drip.  -- Appreciate GI recs  -- Cont home protonix  40 BID -- AM CBC -- transfuse <7

## 2023-08-03 NOTE — Assessment & Plan Note (Addendum)
 Crohn's disease: Continue mesalamine  1.2 g daily Paroxysmal atrial fibrillation: Heart rate controlled on admission. Does not take rate control agents. Holding home eliquis  in setting of heparin  drip for PE.

## 2023-08-03 NOTE — Assessment & Plan Note (Addendum)
 GI deferring ERCP to outpatient now. Ascites appears stable.  --Will obtain ultrasound to evaluate ascites; patient may need more fluid taken off -- GI consulted, appreciate recommendations -- probably needs ERCP, family would prefer to have this done in the inpatient setting -- AM CMP

## 2023-08-03 NOTE — Assessment & Plan Note (Addendum)
 Continued on room air and walking down the hallway with some oxygen need then (not documented). VSS overnight. Low suspicion for failure of Eliquis  - will not plan to load Eliquis  prior to restarting. - Heparin  per pharmacy - transition to home Eliquis  5 mg BID when indicated - Continuous cardiac monitoring - Daily APTT, heparin  level  - AM CBC, CMP

## 2023-08-03 NOTE — Progress Notes (Signed)
 Daily Progress Note Intern Pager: [phone]  Patient name: Diane Freeman Medical record number: [id number] Date of birth: February 21, 1946 Age: 78 y.o. Gender: female  Primary Care Provider: Sarahann Cumins, DO Consultants: GI Code Status: DNR  Pt Overview and Major Events to Date:  4/30 - admitted; paracentesis w 3.7 L out   Assessment and Plan: Diane Freeman is a 78 y.o. female with PMH dementia, GERD esophagitis, Crohn's disease on mesalamine , recent DVT on Eliquis , HFpEF, HLD, PAF, h/o recent sepsis and Ecoli bacteremia with unknown source who presented for new onset SOB and was found to have PE.  Also with new onset ascites being followed by GI, planning for ERCP after heparin  washout. Assessment & Plan Pulmonary emboli (HCC) Continued on room air and walking down the hallway with some oxygen need then (not documented). VSS overnight. Low suspicion for failure of Eliquis  - will not plan to load Eliquis  prior to restarting. - Heparin  per pharmacy - transition to home Eliquis  5 mg BID when indicated - Continuous cardiac monitoring - Daily APTT, heparin  level  - AM CBC, CMP Dilated bile duct  Ascites GI deferring ERCP to outpatient now. Ascites appears stable.  --Will obtain ultrasound to evaluate ascites; patient may need more fluid taken off -- GI consulted, appreciate recommendations -- probably needs ERCP, family would prefer to have this done in the inpatient setting -- AM CMP  Normocytic anemia Hgb 9.1->8.3, on heparin  drip.  -- Appreciate GI recs  -- Cont home protonix  40 BID -- AM CBC -- transfuse <7 Diastolic CHF (HCC) Bilateral pitting edema somewhat improved this AM. Not on GDMT nor diuretics at home. Last echo in Mar 2025 with EF 60-65%, G1DD; repeat echo here unchanged.  Body wall anasarca and mesenteric congestion on CT A&P.  May need additional diuretics for ascites, appreciate GI recommendations. -- AM CBC, CMP Chronic health problem Crohn's disease:  Continue mesalamine  1.2 g daily Paroxysmal atrial fibrillation: Heart rate controlled on admission. Does not take rate control agents. Holding home eliquis  in setting of heparin  drip for PE. Goals of care, counseling/discussion Patient switched to DNR status this admission.  Plan to continue goals of care discussion.  Consider consulting palliative care.    FEN/GI: Heart healthy diet PPx: Heparin  drip Dispo:SNF  early next week . Barrier includes continued bleeding on anticoagulation.   Subjective:  Reports feeling better today and at her baseline.   Objective: Temp:  [97.3 F (36.3 C)-98.4 F (36.9 C)] 98.2 F (36.8 C) (05/03 1145) Pulse Rate:  [61-88] 75 (05/03 1145) Resp:  [14-24] 23 (05/03 1145) BP: (97-124)/(57-69) 108/66 (05/03 1145) SpO2:  [92 %-97 %] 94 % (05/03 1145) Weight:  [142 lb 10.2 oz (64.7 kg)] 142 lb 10.2 oz (64.7 kg) (05/03 0326) Physical Exam: General: Calm and cooperative, sitting in chair.  Not in acute distress Cardiovascular: Normal rate and rhythm.  Normal heart sounds. Respiratory: Normal effort at rest.  Lungs clear to auscultation bilaterally. Abdomen: Minimal ascites.  No tenderness to palpation.  Bowel sounds present. Extremities: Bilateral 1+ lower extremity edema.  Significant bilateral lower extremity venous stasis.  Laboratory: Most recent CBC Lab Results  Component Value Date   WBC 2.9 (L) 08/03/2023   HGB 8.3 (L) 08/03/2023   HCT 26.4 (L) 08/03/2023   MCV 84.1 08/03/2023   PLT 138 (L) 08/03/2023   Most recent BMP    Latest Ref Rng & Units 08/03/2023   11:00 AM  BMP  Glucose 70 - 99  mg/dL 91   BUN 8 - 23 mg/dL 11   Creatinine 9.14 - 1.00 mg/dL 7.82   Sodium 956 - 213 mmol/L 134   Potassium 3.5 - 5.1 mmol/L 3.6   Chloride 98 - 111 mmol/L 103   CO2 22 - 32 mmol/L 22   Calcium  8.9 - 10.3 mg/dL 7.8     Verdell Given, MD 08/03/2023, 3:29 PM  PGY-1, [facility] FPTS Intern pager: [phone], text pages welcome Secure  chat group [facility] Teaching Service

## 2023-08-03 NOTE — Progress Notes (Signed)
 Chart checking  for renal function  Got dose of lasix  40 mg and aldactone  100 mg  yesterday with mild bump in Cr from 0.96 to 1.04.   Diuretic doses since decreased to lasix  20 and aldactone  50 mg ( got these today). Will check am BMET

## 2023-08-03 NOTE — Plan of Care (Signed)
 Pt informed of medications and what they are for. Pt understands

## 2023-08-03 NOTE — Progress Notes (Signed)
 PHARMACY - ANTICOAGULATION CONSULT NOTE  Pharmacy Consult for IV heparin  Indication: pulmonary embolus and DVT  No Known Allergies  Patient Measurements: Height: 5\' 3"  (160 cm) Weight: 64.7 kg (142 lb 10.2 oz) IBW/kg (Calculated) : 52.4 HEPARIN  DW (KG): 67.4  Vital Signs: Temp: 97.3 F (36.3 C) (05/03 0326) Temp Source: Oral (05/03 0326) BP: 97/69 (05/03 0326) Pulse Rate: 88 (05/03 0326)  Labs: Recent Labs    08/01/23 0220 08/01/23 1201 08/01/23 1307 08/01/23 2102 08/02/23 0959 08/03/23 0245  HGB 7.4* 8.5*  --   --  9.1* 8.3*  HCT 23.5* 27.4*  --   --  29.4* 26.4*  PLT 147* 151  --   --  160 138*  APTT 133*  --  75* 89* 71*  --   HEPARINUNFRC 1.00*  --   --   --  0.43 0.26*  CREATININE 0.96  --   --   --  1.04*  --     Estimated Creatinine Clearance: 39.7 mL/min (A) (by C-G formula based on SCr of 1.04 mg/dL (H)).  Assessment: 29 yof admitted with chest and abdominal pain and SOB found to have bilateral PE with small clot burden on CTA chest. Of note, patient had recent new DVT (07/17/23) and was started on apixaban  which was given at [facility]. Suspect this is not an apixaban  failure given recent DVT. She had a GIB and LA grade D reflux esophagitis in 06/2023. Pharmacy consulted to begin IV heparin .  Heparin  level 0.26 is subtherapeutic with heparin  running at 850 units/hr. Hgb (8.3) and PLTs (138) are stable. Per RN, no report of pauses, issues with the line, or signs of bleeding.    Goal of Therapy:  Heparin  level 0.3-0.5 units/ml aPTT 66-85 seconds Monitor platelets by anticoagulation protocol: Yes   Plan:  Increase heparin  infusion to 900 units/hr Check 8 hour heparin  level Daily heparin  level, CBC Monitor for s/sx of bleeding F/u ability to resume apixaban    Thank you for involving pharmacy in this patient's care.  Valarie Garner, PharmD PGY1 Pharmacy Resident  Please check AMION for all [facility] phone numbers After 10:00 PM, call Main Pharmacy  [phone] 08/03/2023 7:22 AM

## 2023-08-04 DIAGNOSIS — K838 Other specified diseases of biliary tract: Secondary | ICD-10-CM | POA: Diagnosis not present

## 2023-08-04 DIAGNOSIS — I2694 Multiple subsegmental pulmonary emboli without acute cor pulmonale: Secondary | ICD-10-CM | POA: Diagnosis not present

## 2023-08-04 LAB — CBC
HCT: 25.3 % — ABNORMAL LOW (ref 36.0–46.0)
Hemoglobin: 8.2 g/dL — ABNORMAL LOW (ref 12.0–15.0)
MCH: 27 pg (ref 26.0–34.0)
MCHC: 32.4 g/dL (ref 30.0–36.0)
MCV: 83.2 fL (ref 80.0–100.0)
Platelets: 134 10*3/uL — ABNORMAL LOW (ref 150–400)
RBC: 3.04 MIL/uL — ABNORMAL LOW (ref 3.87–5.11)
RDW: 17 % — ABNORMAL HIGH (ref 11.5–15.5)
WBC: 2.9 10*3/uL — ABNORMAL LOW (ref 4.0–10.5)
nRBC: 0 % (ref 0.0–0.2)

## 2023-08-04 LAB — PROTIME-INR
INR: 1.5 — ABNORMAL HIGH (ref 0.8–1.2)
Prothrombin Time: 18 s — ABNORMAL HIGH (ref 11.4–15.2)

## 2023-08-04 LAB — COMPREHENSIVE METABOLIC PANEL WITH GFR
ALT: 10 U/L (ref 0–44)
AST: 18 U/L (ref 15–41)
Albumin: 1.9 g/dL — ABNORMAL LOW (ref 3.5–5.0)
Alkaline Phosphatase: 56 U/L (ref 38–126)
Anion gap: 7 (ref 5–15)
BUN: 11 mg/dL (ref 8–23)
CO2: 24 mmol/L (ref 22–32)
Calcium: 7.5 mg/dL — ABNORMAL LOW (ref 8.9–10.3)
Chloride: 103 mmol/L (ref 98–111)
Creatinine, Ser: 1.18 mg/dL — ABNORMAL HIGH (ref 0.44–1.00)
GFR, Estimated: 47 mL/min — ABNORMAL LOW (ref >60)
Glucose, Bld: 89 mg/dL (ref 70–99)
Potassium: 3.1 mmol/L — ABNORMAL LOW (ref 3.5–5.1)
Sodium: 134 mmol/L — ABNORMAL LOW (ref 135–145)
Total Bilirubin: 0.9 mg/dL (ref 0.0–1.2)
Total Protein: 5.3 g/dL — ABNORMAL LOW (ref 6.5–8.1)

## 2023-08-04 LAB — HEPARIN LEVEL (UNFRACTIONATED): Heparin Unfractionated: 0.25 [IU]/mL — ABNORMAL LOW (ref 0.30–0.70)

## 2023-08-04 MED ORDER — APIXABAN 5 MG PO TABS
5.0000 mg | ORAL_TABLET | Freq: Two times a day (BID) | ORAL | Status: DC
  Administered 2023-08-04 – 2023-08-07 (×7): 5 mg via ORAL
  Filled 2023-08-04 (×7): qty 1

## 2023-08-04 MED ORDER — POTASSIUM CHLORIDE CRYS ER 20 MEQ PO TBCR
40.0000 meq | EXTENDED_RELEASE_TABLET | Freq: Once | ORAL | Status: AC
  Administered 2023-08-04: 40 meq via ORAL
  Filled 2023-08-04: qty 2

## 2023-08-04 NOTE — Progress Notes (Signed)
 Creatinine stable overnight at 1.18 but up from baseline. Will continue low dose diuretics for now ( Lasix  20 mg / aldactone  50 mg).  Low K+ of 3.1 noted. Will repeat BMET in am. May consider increasing aldactone  if persistently hypokalemic.

## 2023-08-04 NOTE — Assessment & Plan Note (Addendum)
 Unclear cause. GI deferring ERCP to outpatient now. Ascites continues to be improved after paracentesis. -- GI consulted, appreciate recommendations -- probably needs ERCP, family would prefer to have this done in the inpatient setting -- AM CMP  -- Lasix  20mg  daily -- Spironolactone  50mg  daily

## 2023-08-04 NOTE — Assessment & Plan Note (Addendum)
 Hemodynamically stable, on room air. Platelets stable. - Heparin  per pharmacy - Discussed with pharmacy transition to oral DOAC given possible Eliquis  failure - Continuous cardiac monitoring - Daily APTT, heparin  level  - AM CBC, CMP

## 2023-08-04 NOTE — Assessment & Plan Note (Signed)
 Crohn's disease: Continue mesalamine  1.2 g daily Paroxysmal atrial fibrillation: Heart rate controlled on admission. Does not take rate control agents. Holding home eliquis  in setting of heparin  drip for PE.

## 2023-08-04 NOTE — Progress Notes (Signed)
 Daily Progress Note Intern Pager: [phone]  Patient name: Diane Freeman Medical record number: [id number] Date of birth: 1946-01-22 Age: 78 y.o. Gender: female  Primary Care Provider: Sarahann Cumins, DO Consultants: GI Code Status: DNR  Pt Overview and Major Events to Date:  4/30 - admitted; paracentesis w 3.7 L out   Assessment and Plan: [name] is a 78 y.o. female with PMH dementia, GERD esophagitis, Crohn's disease on mesalamine , recent DVT on Eliquis , HFpEF, HLD, PAF, h/o recent sepsis 2/2 Ecoli bacteremia with unknown source who presented for new onset SOB and was found to have PE. Additionally found to have new ascites on admission with unclear cause, undergoing work-up. Assessment & Plan Pulmonary emboli (HCC) Hemodynamically stable, on room air. Platelets stable. - Heparin  per pharmacy - Discussed with pharmacy transition to oral DOAC given possible Eliquis  failure - Continuous cardiac monitoring - Daily APTT, heparin  level  - AM CBC, CMP Dilated bile duct  Ascites Unclear cause. GI deferring ERCP to outpatient now. Ascites continues to be improved after paracentesis. -- GI consulted, appreciate recommendations -- probably needs ERCP, family would prefer to have this done in the inpatient setting -- AM CMP  -- Lasix  20mg  daily -- Spironolactone  50mg  daily Normocytic anemia Hgb 8.2, on heparin  drip.  -- Appreciate GI recs  -- Cont home protonix  40 BID -- AM CBC -- transfuse <7 Diastolic CHF (HCC) Likely mildly volume up; however responding well to Lasix . No acute exacerbation this admission. -- AM CBC, CMP -- Continue diuretics as above -- Spironolactone  as above -- Strict I/O Goals of care, counseling/discussion Patient switched to DNR status this admission.  Plan to continue goals of care discussion.  Consider consulting palliative care.  Chronic health problem Crohn's disease: Continue mesalamine  1.2 g daily Paroxysmal atrial  fibrillation: Heart rate controlled on admission. Does not take rate control agents. Holding home eliquis  in setting of heparin  drip for PE.   FEN/GI: Heart healthy diet PPx: Heparin  gtt Dispo: SNF pending, stable hemoglobin  Subjective:  No acute events overnight.  Patient denies any abdominal pain or difficulty breathing.  Objective: Temp:  [97.3 F (36.3 C)-98.2 F (36.8 C)] 97.7 F (36.5 C) (05/03 2356) Pulse Rate:  [75-88] 86 (05/03 2356) Resp:  [20-27] 22 (05/03 2356) BP: (97-113)/(52-87) 109/87 (05/03 2356) SpO2:  [94 %-98 %] 95 % (05/03 2356) Weight:  [64.7 kg] 64.7 kg (05/03 0326) Physical Exam: General: NAD, uncomfortably in hospital bed Neuro: A&O Cardiovascular: RRR, no murmurs, 1+ peripheral edema Respiratory: normal WOB on RA, CTAB, no wheezes, ronchi or rales Abdomen: soft, NTTP, no rebound or guarding Extremities: Moving all 4 extremities equally   Laboratory: Most recent CBC Lab Results  Component Value Date   WBC 4.1 08/03/2023   HGB 8.5 (L) 08/03/2023   HCT 26.7 (L) 08/03/2023   MCV 84.8 08/03/2023   PLT 144 (L) 08/03/2023   Most recent BMP    Latest Ref Rng & Units 08/03/2023   11:00 AM  BMP  Glucose 70 - 99 mg/dL 91   BUN 8 - 23 mg/dL 11   Creatinine 1.47 - 1.00 mg/dL 8.29   Sodium 562 - 130 mmol/L 134   Potassium 3.5 - 5.1 mmol/L 3.6   Chloride 98 - 111 mmol/L 103   CO2 22 - 32 mmol/L 22   Calcium  8.9 - 10.3 mg/dL 7.8     Imaging/Diagnostic Tests: US  Ascites - Scattered ascites greatest in the left lower quadrant.   Ivin Marrow,  MD 08/04/2023, 12:23 AM  PGY-2, [facility] FPTS Intern pager: [phone], text pages welcome Secure chat group [facility] Teaching Service

## 2023-08-04 NOTE — Assessment & Plan Note (Signed)
 Patient switched to DNR status this admission.  Plan to continue goals of care discussion.  Consider consulting palliative care.

## 2023-08-04 NOTE — Plan of Care (Signed)
  Problem: Education: Goal: Knowledge of General Education information will improve Description: Including pain rating scale, medication(s)/side effects and non-pharmacologic comfort measures Outcome: Progressing   Problem: Health Behavior/Discharge Planning: Goal: Ability to manage health-related needs will improve Outcome: Progressing   Problem: Clinical Measurements: Goal: Respiratory complications will improve Outcome: Progressing   Problem: Clinical Measurements: Goal: Cardiovascular complication will be avoided Outcome: Progressing   

## 2023-08-04 NOTE — Progress Notes (Signed)
 Mobility Specialist Progress Note:   08/04/23 1105  Mobility  Activity Transferred from bed to chair  Level of Assistance Contact guard assist, steadying assist  Assistive Device None  Distance Ambulated (ft) 5 ft  Activity Response Tolerated well  Mobility Referral Yes  Mobility visit 1 Mobility  Mobility Specialist Start Time (ACUTE ONLY) 1105  Mobility Specialist Stop Time (ACUTE ONLY) 1116  Mobility Specialist Time Calculation (min) (ACUTE ONLY) 11 min   Pt agreeable to mobility session, however politely declines ambulation at this time, d/t fatigue. Pt able to stand and transfer to chair with only minG assist and no AD. Left with all needs met.  Oneda Big Mobility Specialist Please contact via SecureChat or  Rehab office at [phone]

## 2023-08-04 NOTE — Care Plan (Signed)
 Called and spoke to patient's sister, Ms. Diane Freeman this morning regarding potential placement at discharge.  Patient greatly desires to go home, though her sister has significant concerns about this.  Sister reports that at home Ms. Diane Freeman does not take any of her medicines or feed herself, and on past discharges when family has looked in on patient she has been in bad shape.  Sister is concerned that she will only deteriorate rapidly if sent home rather than to SNF.  Sister desires long-term care for the patient.  There is possibility in the future that Ms. Diane Freeman may be able to move in with her son, Mr. Diane Freeman, however these arrangements are not yet in place and would not be a solution at this moment.

## 2023-08-04 NOTE — Plan of Care (Signed)
  Problem: Health Behavior/Discharge Planning: Goal: Ability to manage health-related needs will improve Outcome: Progressing   Problem: Clinical Measurements: Goal: Will remain free from infection Outcome: Progressing Goal: Diagnostic test results will improve Outcome: Progressing Goal: Cardiovascular complication will be avoided Outcome: Progressing   

## 2023-08-04 NOTE — Assessment & Plan Note (Addendum)
 Likely mildly volume up; however responding well to Lasix . No acute exacerbation this admission. -- AM CBC, CMP -- Continue diuretics as above -- Spironolactone  as above -- Strict I/O

## 2023-08-04 NOTE — Assessment & Plan Note (Addendum)
 Hgb 8.2, on heparin  drip.  -- Appreciate GI recs  -- Cont home protonix  40 BID -- AM CBC -- transfuse <7

## 2023-08-05 ENCOUNTER — Encounter: Admitting: Gastroenterology

## 2023-08-05 ENCOUNTER — Inpatient Hospital Stay (HOSPITAL_COMMUNITY)

## 2023-08-05 DIAGNOSIS — R161 Splenomegaly, not elsewhere classified: Secondary | ICD-10-CM | POA: Diagnosis not present

## 2023-08-05 DIAGNOSIS — I2694 Multiple subsegmental pulmonary emboli without acute cor pulmonale: Secondary | ICD-10-CM | POA: Diagnosis not present

## 2023-08-05 DIAGNOSIS — K838 Other specified diseases of biliary tract: Secondary | ICD-10-CM | POA: Diagnosis not present

## 2023-08-05 DIAGNOSIS — R188 Other ascites: Secondary | ICD-10-CM | POA: Diagnosis not present

## 2023-08-05 DIAGNOSIS — K449 Diaphragmatic hernia without obstruction or gangrene: Secondary | ICD-10-CM

## 2023-08-05 DIAGNOSIS — K8050 Calculus of bile duct without cholangitis or cholecystitis without obstruction: Secondary | ICD-10-CM | POA: Diagnosis not present

## 2023-08-05 DIAGNOSIS — K219 Gastro-esophageal reflux disease without esophagitis: Secondary | ICD-10-CM

## 2023-08-05 LAB — BASIC METABOLIC PANEL WITH GFR
Anion gap: 5 (ref 5–15)
BUN: 8 mg/dL (ref 8–23)
CO2: 25 mmol/L (ref 22–32)
Calcium: 7.6 mg/dL — ABNORMAL LOW (ref 8.9–10.3)
Chloride: 103 mmol/L (ref 98–111)
Creatinine, Ser: 1.21 mg/dL — ABNORMAL HIGH (ref 0.44–1.00)
GFR, Estimated: 46 mL/min — ABNORMAL LOW (ref >60)
Glucose, Bld: 84 mg/dL (ref 70–99)
Potassium: 3.6 mmol/L (ref 3.5–5.1)
Sodium: 133 mmol/L — ABNORMAL LOW (ref 135–145)

## 2023-08-05 NOTE — NC FL2 (Signed)
 [place]  MEDICAID FL2 LEVEL OF CARE FORM     IDENTIFICATION  Patient Name: Diane Freeman Birthdate: 10/09/1945 Sex: female Admission Date (Current Location): 07/31/2023  [place] and [place] Number:  Diane Freeman [id number] T Facility and Address:  [facility]. [facility], [address], [place], [place] [address]      Provider Number: [id number]  Attending Physician Name and Address:  Charise Companion, MD  Relative Name and Phone Number:  Theodora Fish, sister - [phone]    Current Level of Care: Hospital Recommended Level of Care: Skilled Nursing Facility Prior Approval Number:    Date Approved/Denied:   PASRR Number: [id number]  Discharge Plan: SNF    Current Diagnoses: Patient Active Problem List   Diagnosis Date Noted   Other ascites 08/02/2023   Goals of care, counseling/discussion 08/01/2023   Pulmonary emboli (HCC) 07/31/2023   Dilated bile duct  Ascites 07/31/2023   Choledocholithiasis 07/31/2023   Dementia (HCC)  Acute encephalopathy, resolved. 07/21/2023   Normocytic anemia 07/21/2023   Chronic health problem 07/21/2023   Femoral deep venous thrombosis (DVT) (HCC) 07/21/2023   Sepsis due to Escherichia coli with encephalopathy and septic shock (HCC) 07/21/2023   Malnutrition of moderate degree (HCC) 07/21/2023   Staph aureus methacillin-sensitive UTI 07/21/2023   AKI (acute kidney injury) (HCC) 07/17/2023   Esophagitis determined by endoscopy 06/11/2023   Hematemesis 06/09/2023   Other fatigue 12/30/2022   Physical deconditioning 12/06/2022   Paroxysmal atrial fibrillation with RVR (HCC) 12/05/2022   Septic shock (HCC), resolved  E. coli Bacteremia 12/03/2022   Actinic keratoses 02/17/2018   Hyperlipidemia 01/24/2018   Diastolic CHF (HCC) 06/14/2013   Crohn's disease (HCC) 06/10/2013   ESOPHAGEAL STRICTURE 11/27/2007   GERD with esophagitis 11/27/2007    Orientation RESPIRATION BLADDER Height & Weight     Self, Time, Place   Normal External catheter, Incontinent Weight: 139 lb 4.8 oz (63.2 kg) Height:  5\' 3"  (160 cm)  BEHAVIORAL SYMPTOMS/MOOD NEUROLOGICAL BOWEL NUTRITION STATUS      Incontinent Diet (See dc summary)  AMBULATORY STATUS COMMUNICATION OF NEEDS Skin   Limited Assist Verbally Other (Comment), PU Stage and Appropriate Care (Wound / Incision: Skin tear ; Pressure Injury Buttocks Right Stage 2 - Partial thickness loss of dermis presenting as a shallow open injury with a red, pink wound bed without slough.)                       Personal Care Assistance Level of Assistance  Dressing, Bathing, Feeding Bathing Assistance: Limited assistance Feeding assistance: Limited assistance Dressing Assistance: Limited assistance     Functional Limitations Info  Speech, Hearing, Sight Sight Info: Adequate Hearing Info: Adequate Speech Info: Adequate    SPECIAL CARE FACTORS FREQUENCY  PT (By licensed PT), OT (By licensed OT)     PT Frequency: 5x per week OT Frequency: 5x per week            Contractures Contractures Info: Not present    Additional Factors Info  Code Status, Allergies Code Status Info: Full code Allergies Info: NKDA           Current Medications (08/05/2023):  This is the current hospital active medication list Current Facility-Administered Medications  Medication Dose Route Frequency Provider Last Rate Last Admin   apixaban  (ELIQUIS ) tablet 5 mg  5 mg Oral BID Quillen, Michael, MD   5 mg at 08/05/23 0901   furosemide  (LASIX ) tablet 20 mg  20 mg Oral Daily Pyrtle,  Amber Bail, MD   20 mg at 08/05/23 0900   mesalamine  (LIALDA ) EC tablet 2.4 g  2.4 g Oral Q breakfast Pyrtle, Amber Bail, MD   2.4 g at 08/05/23 2841   nystatin  cream (MYCOSTATIN )   Topical BID Dema Filler, MD   Given at 08/05/23 0902   pantoprazole  (PROTONIX ) EC tablet 40 mg  40 mg Oral BID Carey Chapman, MD   40 mg at 08/05/23 0901   spironolactone  (ALDACTONE ) tablet 50 mg  50 mg Oral Daily Omar Bibber, DO   50 mg at  08/05/23 0901     Discharge Medications: Please see discharge summary for a list of discharge medications.  Relevant Imaging Results:  Relevant Lab Results:   Additional Information SS# [id number]  Andrey Hoobler C Micole Delehanty, LCSWA

## 2023-08-05 NOTE — Progress Notes (Addendum)
 Patient ID: Diane Freeman, female   DOB: 1946-03-23, 78 y.o.   MRN: [id number]     Attending physician's note   I have taken a history, reviewed the chart, and examined the patient. I performed a substantive portion of this encounter, including complete performance of at least one of the key components, in conjunction with the APP. I agree with the APP's note, impression, and recommendations with my edits.   Not entirely clear the source of her ascites.  No history of cirrhosis and liver not cirrhotic appearing on imaging.  Using her historical albumin  levels, paracentesis more c/w high SAAG, with low protein.  Otherwise preserved renal function.  Paracentesis negative for SBP.  No imaging to suggest intra-abdominal malignancy.  Plan for ultrasound with Doppler, and may need to consider repeat paracentesis with cytology.  New PE certainly concerning feature.  Otherwise, seems to responding well to diuretics.  - Continue Lasix /Aldactone  - Ultrasound with Doppler - MRCP with stone in the distal CBD, but otherwise normal liver enzymes, including normal alkaline phosphatase and T. bili.  Suspect this is chronic and does not seem to be an acute issue  Zaharah Amir, DO, FACG [phone] office          Progress Note   Subjective   Day # 5 CC; hx of Crohns, bilateral PE's, new ascites  Lasix20 / aldactone  50 heparin > Eliquis   Sodium 133/potassium 3.6/BUN 8/creatinine 1.21 trending up  Patient lying comfortably in bed all bundled up says she gets cold easily, no current complaints, specifically denies any chest pain or shortness of breath, no abdominal discomfort.  On further discussion she says her abdomen has felt somewhat bigger or heavier over the past couple of months.  No worse today than over the past few days   Objective   Vital signs in last 24 hours: Temp:  [97.3 F (36.3 C)-98 F (36.7 C)] 97.3 F (36.3 C) (05/05 1212) Pulse Rate:  [77-93] 77 (05/05 1212) Resp:   [19-22] 21 (05/05 1212) BP: (104-126)/(54-65) 126/61 (05/05 1212) SpO2:  [92 %-99 %] 95 % (05/05 1212) Weight:  [63.2 kg] 63.2 kg (05/05 0502) Last BM Date : 08/02/23 General:   Elderly white female in NAD Heart:  Regular rate and rhythm; no murmurs Lungs: Respirations even and unlabored, lungs CTA bilaterally Abdomen:  Soft, full feeling, nontense ascites normal bowel sounds.,  Nontender, no palpable mass or hepatosplenomegaly Extremities: 1-2+ edema bilateral lower extremities Neurologic:  Alert and oriented,  grossly normal neurologically. Psych:  Cooperative. Normal mood and affect.  Intake/Output from previous day: 05/04 0701 - 05/05 0700 In: 360 [P.O.:360] Out: 200 [Urine:200] Intake/Output this shift: No intake/output data recorded.  Lab Results: Recent Labs    08/03/23 0245 08/03/23 1547 08/04/23 0253  WBC 2.9* 4.1 2.9*  HGB 8.3* 8.5* 8.2*  HCT 26.4* 26.7* 25.3*  PLT 138* 144* 134*   BMET Recent Labs    08/03/23 1100 08/04/23 0253 08/05/23 0236  NA 134* 134* 133*  K 3.6 3.1* 3.6  CL 103 103 103  CO2 22 24 25   GLUCOSE 91 89 84  BUN 11 11 8   CREATININE 1.18* 1.18* 1.21*  CALCIUM  7.8* 7.5* 7.6*   LFT Recent Labs    08/04/23 0253  PROT 5.3*  ALBUMIN  1.9*  AST 18  ALT 10  ALKPHOS 56  BILITOT 0.9   PT/INR Recent Labs    08/04/23 0253  LABPROT 18.0*  INR 1.5*      Assessment / Plan:    #  89 78 year old female admitted with shortness of breath/chest pain Workup revealed bilateral PEs Doppler showed acute DVT extensive on the right including an acute intramuscular thrombosis involving the right gastrocnemius  veins Started on IV heparin , now transition to Eliquis   #2 anasarca and new ascites Has no history of cirrhosis and does not have any imaging findings on contrasted CT or MRI suggestive of cirrhosis, does have mild splenomegaly Echo this admission with EF 60 to 65%, grade 1 diastolic dysfunction and mild aortic regurgitation  She has been  on low-dose diuretics with gradual diuresis Did undergo diagnostic and therapeutic paracentesis 4/30 with 3.7 L removed No evidence for SBP No cytology done Initial SAAG high-however albumin  was 2.1 on admission and now 1.7  Etiology of her ascites is still not clear-will need further evaluation with Doppler ultrasound of the liver,, may need repeat diagnostic tap for cytology.  #3 abdominal pain-MRCP shows a 9 x 9 x 30 mm stone in the distal common bile duct with extrahepatic ductal dilation measuring up to 1.1 cm-she has been afebrile, LFTs normal CBD stone felt to be incidental at this time and not responsible for current symptoms  #4 longstanding Crohn's colitis-no active disease at last exam 2022, maintained on Apriso   #5 GERD large hiatal hernia #6 history of A-fib-on Eliquis   Plan; creatinine creeping up-May need to decrease dose of Lasix  and Aldactone  Will order Doppler ultrasound of the liver-assess portal vein Discussed repeat diagnostic or low volume paracentesis for cytology    Principal Problem:   Pulmonary emboli (HCC) Active Problems:   Diastolic CHF (HCC)   Normocytic anemia   Chronic health problem   Dilated bile duct  Ascites   Choledocholithiasis   Goals of care, counseling/discussion   Other ascites     LOS: 5 days   Amy Esterwood PA-C 08/05/2023, 1:26 PM

## 2023-08-05 NOTE — TOC Progression Note (Signed)
 Transition of Care [facility]) - Progression Note    Patient Details  Name: Diane Freeman MRN: [id number] Date of Birth: Aug 12, 1945  [facility] CM/SW Contact  Arron Big, [place] Phone Number: 08/05/2023, 3:00 PM  Clinical Narrative:   PT updated recs from HHPT to SNF.  CSW to complete SNF workup.  TOC will continue to follow.    Expected Discharge Plan: Skilled Nursing Facility Barriers to Discharge: SNF Pending bed offer  Expected Discharge Plan and Services In-house Referral: Clinical Social Work     Living arrangements for the past 2 months: Skilled Nursing Facility                                       Social Determinants of Health (SDOH) Interventions SDOH Screenings   Food Insecurity: No Food Insecurity (07/31/2023)  Housing: Low Risk  (07/31/2023)  Transportation Needs: No Transportation Needs (07/31/2023)  Utilities: Not At Risk (07/31/2023)  Alcohol Screen: Low Risk  (12/31/2022)  Depression (PHQ2-9): Medium Risk (06/14/2023)  Financial Resource Strain: Low Risk  (12/31/2022)  Physical Activity: Insufficiently Active (12/31/2022)  Social Connections: Moderately Isolated (07/31/2023)  Stress: No Stress Concern Present (12/31/2022)  Tobacco Use: Low Risk  (08/02/2023)  Health Literacy: Adequate Health Literacy (12/31/2022)    Readmission Risk Interventions    06/11/2023   11:24 AM  Readmission Risk Prevention Plan  Post Dischage Appt Complete  Medication Screening Complete  Transportation Screening Complete

## 2023-08-05 NOTE — Progress Notes (Signed)
 Physical Therapy Treatment Patient Details Name: Diane Freeman MRN: [id number] DOB: 02-06-1946 Today's Date: 08/05/2023   History of Present Illness Diane Freeman is a 78 y.o. female admitted 07/31/23 for SOB and chest/abdominal pain. CTPE demonstrated B PE, overall small clot burden. New ascites and CBD dilatation found on imaging. Pt underwent paracentesis which removed 3.7 L. PMH of dementia, GERD esophagitis, Crohn's disease on mesalamine , recent DVT on Eliquis , HFpEF, HLD, PAF, h/o recent sepsis and Ecoli bacteremia with unknown source.    PT Comments  Patient ambulatory in hallway on RA and able to maintain SpO2 at end of session 91%.  She denied SOB and was not dyspneic.  She was indifferent about her reason for hospitalization and seems unsafe for home alone with poor hygiene and limited insight.  Feel she will need continued post-acute inpatient rehab till safe d/c plan in place.  Family considering assisting though reportedly need time to set that up.  PT will continue to follow in the acute setting prior to d/c.   If plan is discharge home, recommend the following: A little help with bathing/dressing/bathroom;Assistance with cooking/housework;Assist for transportation;Help with stairs or ramp for entrance;A lot of help with walking and/or transfers   Can travel by private vehicle     Yes  Equipment Recommendations  None recommended by PT    Recommendations for Other Services       Precautions / Restrictions Precautions Precautions: Fall     Mobility  Bed Mobility   Bed Mobility: Supine to Sit     Supine to sit: Supervision, HOB elevated Sit to supine: Contact guard assist   General bed mobility comments: cues for positioning to supine, assist for lines/safety up to EOB    Transfers Overall transfer level: Needs assistance Equipment used: None Transfers: Sit to/from Stand Sit to Stand: Contact guard assist           General transfer comment: assist for  balance/safety from EOB, toilet in bathroom    Ambulation/Gait Ambulation/Gait assistance: Contact guard assist, Supervision Gait Distance (Feet): 140 Feet Assistive device: Rolling walker (2 wheels) Gait Pattern/deviations: Step-through pattern, Decreased stride length       General Gait Details: slow pace, steady with RW, some without device though reaching for UE support to bathroom, she was on RA throughout no signs of distress   Stairs             Wheelchair Mobility     Tilt Bed    Modified Rankin (Stroke Patients Only)       Balance Overall balance assessment: Needs assistance   Sitting balance-Leahy Scale: Good     Standing balance support: No upper extremity supported, During functional activity Standing balance-Leahy Scale: Fair Standing balance comment: washed hands at sink                            Communication Communication Communication: No apparent difficulties  Cognition Arousal: Alert Behavior During Therapy: WFL for tasks assessed/performed   PT - Cognitive impairments: History of cognitive impairments, Orientation, Safety/Judgement, Awareness   Orientation impairments: Time, Situation                   PT - Cognition Comments: stated here due to "Old age" Does report remembers them taking 3 containers of fluid from her belly. Following commands: Intact      Cueing Cueing Techniques: Verbal cues  Exercises      General Comments General  comments (skin integrity, edema, etc.): toileted in bathroom with S though noted soiled bed so assisted to ensure adequate hygiene after changing bed linens      Pertinent Vitals/Pain Pain Assessment Pain Assessment: No/denies pain    Home Living                          Prior Function            PT Goals (current goals can now be found in the care plan section) Progress towards PT goals: Progressing toward goals    Frequency    Min 2X/week      PT  Plan      Co-evaluation              AM-PAC PT "6 Clicks" Mobility   Outcome Measure  Help needed turning from your back to your side while in a flat bed without using bedrails?: A Little Help needed moving from lying on your back to sitting on the side of a flat bed without using bedrails?: A Little Help needed moving to and from a bed to a chair (including a wheelchair)?: A Little Help needed standing up from a chair using your arms (e.g., wheelchair or bedside chair)?: A Little Help needed to walk in hospital room?: A Little Help needed climbing 3-5 steps with a railing? : A Little 6 Click Score: 18    End of Session Equipment Utilized During Treatment: Gait belt Activity Tolerance: Patient tolerated treatment well Patient left: in bed;with call bell/phone within reach;with bed alarm set   PT Visit Diagnosis: Unsteadiness on feet (R26.81);Other abnormalities of gait and mobility (R26.89);Difficulty in walking, not elsewhere classified (R26.2)     Time: 1235-1300 PT Time Calculation (min) (ACUTE ONLY): 25 min  Charges:    $Gait Training: 8-22 mins $Therapeutic Activity: 8-22 mins PT General Charges $$ ACUTE PT VISIT: 1 Visit                     Diane Freeman, PT Acute Rehabilitation Services Office:[phone] 08/05/2023    Diane Freeman 08/05/2023, 1:58 PM

## 2023-08-05 NOTE — Assessment & Plan Note (Addendum)
 Hgb stable in the low to mid 8s for several days; no signs of bleeding on exam. Now back on Eliquis .  -- Appreciate GI recs  -- Continue home protonix  40 mg BID -- transfuse <7

## 2023-08-05 NOTE — Assessment & Plan Note (Addendum)
 Patient remains hemodynamically stable, on room air. - Transitioned to Eliquis  5 mg twice daily yesterday

## 2023-08-05 NOTE — Assessment & Plan Note (Addendum)
 Patient switched to DNR status this admission.  Plan to continue goals of care discussion.  Consider consulting palliative care.

## 2023-08-05 NOTE — Assessment & Plan Note (Addendum)
 Crohn's disease: Continue mesalamine  1.2 g daily Paroxysmal atrial fibrillation: Heart rate controlled on admission. Does not take rate control agents.  Continuing home Eliquis  5 mg BID.

## 2023-08-05 NOTE — Plan of Care (Signed)

## 2023-08-05 NOTE — Assessment & Plan Note (Addendum)
 Still of unclear origin.  GI deferring ERCP to outpatient now due to PE and need for anticoagulation.  Abdomen slightly more distended today though not causing patient any discomfort. -- GI consulted, appreciate recommendations -- Will need ERCP in the outpatient setting with GI, pending GOC and family preference -- Lasix  PO 20 mg daily -- Spironolactone  50 mg daily

## 2023-08-05 NOTE — Plan of Care (Signed)
  Problem: Clinical Measurements: Goal: Ability to maintain clinical measurements within normal limits will improve Outcome: Progressing Goal: Will remain free from infection Outcome: Progressing Goal: Respiratory complications will improve Outcome: Progressing Goal: Cardiovascular complication will be avoided Outcome: Progressing   Problem: Elimination: Goal: Will not experience complications related to urinary retention Outcome: Progressing   Problem: Pain Managment: Goal: General experience of comfort will improve and/or be controlled Outcome: Progressing

## 2023-08-05 NOTE — NC FL2 (Deleted)
 [place]  MEDICAID FL2 LEVEL OF CARE FORM     IDENTIFICATION  Patient Name: Diane Freeman Birthdate: [birth year] Sex: female Admission Date (Current Location): 07/31/2023  [place] and [place] Number:  Ernesto Heady [id number] T Facility and Address:  [facility], [address] [place], [place] [address]      Provider Number: [id number]  Attending Physician Name and Address:  Charise Companion, MD  Relative Name and Phone Number:  Theodora Fish, sister - [phone]    Current Level of Care: Hospital Recommended Level of Care: Skilled Nursing Facility Prior Approval Number:    Date Approved/Denied:   PASRR Number: [id number]  Discharge Plan: SNF    Current Diagnoses: Patient Active Problem List   Diagnosis Date Noted   Other ascites 08/02/2023   Goals of care, counseling/discussion 08/01/2023   Pulmonary emboli (HCC) 07/31/2023   Dilated bile duct  Ascites 07/31/2023   Choledocholithiasis 07/31/2023   Dementia (HCC)  Acute encephalopathy, resolved. 07/21/2023   Normocytic anemia 07/21/2023   Chronic health problem 07/21/2023   Femoral deep venous thrombosis (DVT) (HCC) 07/21/2023   Sepsis due to Escherichia coli with encephalopathy and septic shock (HCC) 07/21/2023   Malnutrition of moderate degree (HCC) 07/21/2023   Staph aureus methacillin-sensitive UTI 07/21/2023   AKI (acute kidney injury) (HCC) 07/17/2023   Esophagitis determined by endoscopy 06/11/2023   Hematemesis 06/09/2023   Other fatigue 12/30/2022   Physical deconditioning 12/06/2022   Paroxysmal atrial fibrillation with RVR (HCC) 12/05/2022   Septic shock (HCC), resolved  E. coli Bacteremia 12/03/2022   Actinic keratoses 02/17/2018   Hyperlipidemia 01/24/2018   Diastolic CHF (HCC) 06/14/2013   Crohn's disease (HCC) 06/10/2013   ESOPHAGEAL STRICTURE 11/27/2007   GERD with esophagitis 11/27/2007    Orientation RESPIRATION BLADDER Height & Weight     Self, Time, Place   Normal External catheter, Incontinent Weight: 139 lb 4.8 oz (63.2 kg) Height:  5\' 3"  (160 cm)  BEHAVIORAL SYMPTOMS/MOOD NEUROLOGICAL BOWEL NUTRITION STATUS      Incontinent Diet (See dc summary)  AMBULATORY STATUS COMMUNICATION OF NEEDS Skin     Verbally Other (Comment), PU Stage and Appropriate Care (Wound / Incision: Skin tear ; Pressure Injury Buttocks Right Stage 2 - Partial thickness loss of dermis presenting as a shallow open injury with a red, pink wound bed without slough.)                       Personal Care Assistance Level of Assistance  Dressing, Bathing, Feeding           Functional Limitations Info  Speech, Hearing, Sight Sight Info: Adequate Hearing Info: Adequate Speech Info: Adequate    SPECIAL CARE FACTORS FREQUENCY  PT (By licensed PT), OT (By licensed OT)     PT Frequency: 5x per week OT Frequency: 5x per week            Contractures Contractures Info: Not present    Additional Factors Info  Code Status, Allergies Code Status Info: Full code Allergies Info: NKDA           Current Medications (08/05/2023):  This is the current hospital active medication list Current Facility-Administered Medications  Medication Dose Route Frequency Provider Last Rate Last Admin   apixaban  (ELIQUIS ) tablet 5 mg  5 mg Oral BID Quillen, Michael, MD   5 mg at 08/05/23 0901   furosemide  (LASIX ) tablet 20 mg  20 mg Oral Daily Pyrtle, Amber Bail, MD   20  mg at 08/05/23 0900   mesalamine  (LIALDA ) EC tablet 2.4 g  2.4 g Oral Q breakfast Pyrtle, Amber Bail, MD   2.4 g at 08/05/23 4696   nystatin  cream (MYCOSTATIN )   Topical BID Dema Filler, MD   Given at 08/05/23 0902   pantoprazole  (PROTONIX ) EC tablet 40 mg  40 mg Oral BID Carey Chapman, MD   40 mg at 08/05/23 0901   spironolactone  (ALDACTONE ) tablet 50 mg  50 mg Oral Daily Omar Bibber, DO   50 mg at 08/05/23 0901     Discharge Medications: Please see discharge summary for a list of discharge medications.  Relevant  Imaging Results:  Relevant Lab Results:   Additional Information SS# [id number]  Laterrica Libman C Staley Budzinski, LCSWA

## 2023-08-05 NOTE — Assessment & Plan Note (Addendum)
 Likely mildly volume up; however responding well to Lasix . No acute exacerbation this admission. -- Continue diuretics as above -- Spironolactone  as above -- Strict I/O

## 2023-08-05 NOTE — Progress Notes (Signed)
 Daily Progress Note Intern Pager: [phone]  Patient name: Diane Freeman Medical record number: [id number] Date of birth: October 10, 1945 Age: 78 y.o. Gender: female  Primary Care Provider: Sarahann Cumins, DO Consultants: GI Code Status: DNR  Pt Overview and Major Events to Date:  4/30-admitted; paracentesis with 3.7 L out 5/4-transition from heparin  to Eliquis  BID  Assessment and Plan: Diane Freeman is a 78 y.o. female with PMH dementia, GERD esophagitis, Crohn's disease on mesalamine , recent DVT on Eliquis , HFpEF, HLD, PAF, h/o recent sepsis 2/2 E coli bacteremia with unknown source who presented for new onset SOB and was found to have PE. Additionally found to have new ascites on admission with unclear cause, GI following. She is stable this morning and approaching readiness for discharge - most appropriate for SNF. Assessment & Plan Pulmonary emboli [facility]) Patient remains hemodynamically stable, on room air. - Transitioned to Eliquis  5 mg twice daily yesterday Dilated bile duct  Ascites Still of unclear origin.  GI deferring ERCP to outpatient now due to PE and need for anticoagulation.  Abdomen slightly more distended today though not causing patient any discomfort. -- GI consulted, appreciate recommendations -- Will need ERCP in the outpatient setting with GI, pending GOC and family preference -- Lasix  PO 20 mg daily -- Spironolactone  50 mg daily Normocytic anemia Hgb stable in the low to mid 8s for several days; no signs of bleeding on exam. Now back on Eliquis .  -- Appreciate GI recs  -- Continue home protonix  40 mg BID -- transfuse <7 Diastolic CHF (HCC) Likely mildly volume up; however responding well to Lasix . No acute exacerbation this admission. -- Continue diuretics as above -- Spironolactone  as above -- Strict I/O Goals of care, counseling/discussion Patient switched to DNR status this admission.  Plan to continue goals of care discussion.  Consider  consulting palliative care.  Chronic health problem Crohn's disease: Continue mesalamine  1.2 g daily Paroxysmal atrial fibrillation: Heart rate controlled on admission. Does not take rate control agents.  Continuing home Eliquis  5 mg BID.   FEN/GI: Heart healthy diet PPx: Eliquis  5 mg BID Dispo:SNF pending clinical improvement . Barriers include appropriate placement.   Subjective:  Patient seen this morning sitting up in bed eating breakfast.  She denies any concerns.  She is agreeable to SNF.  Objective: Temp:  [97.3 F (36.3 C)-98 F (36.7 C)] 97.3 F (36.3 C) (05/05 1212) Pulse Rate:  [17-93] 17 (05/05 1212) Resp:  [19-22] 21 (05/05 1212) BP: (104-126)/(54-65) 126/61 (05/05 1212) SpO2:  [92 %-99 %] 95 % (05/05 1212) Weight:  [63.2 kg] 63.2 kg (05/05 0502) Physical Exam: General: Well-appearing, pleasant, conversational Cardiovascular: RRR Respiratory: Normal work of breathing on room air Abdomen: Normoactive bowel sounds, soft, nontender.  Mildly increased distention from prior exam. Extremities: Very mild pitting edema, significantly improved from my prior exam.  Moves all extremities equally.  Skin warm and dry.  Laboratory: Most recent CBC Lab Results  Component Value Date   WBC 2.9 (L) 08/04/2023   HGB 8.2 (L) 08/04/2023   HCT 25.3 (L) 08/04/2023   MCV 83.2 08/04/2023   PLT 134 (L) 08/04/2023   Most recent BMP    Latest Ref Rng & Units 08/05/2023    2:36 AM  BMP  Glucose 70 - 99 mg/dL 84   BUN 8 - 23 mg/dL 8   Creatinine 1.47 - 8.29 mg/dL 5.62   Sodium 130 - 865 mmol/L 133   Potassium 3.5 - 5.1 mmol/L 3.6  Chloride 98 - 111 mmol/L 103   CO2 22 - 32 mmol/L 25   Calcium  8.9 - 10.3 mg/dL 7.6     Omar Bibber, DO 08/05/2023, 12:15 PM  PGY-1, [facility] Family Medicine FPTS Intern pager: [phone], text pages welcome Secure chat group [facility] Teaching Service

## 2023-08-05 NOTE — Progress Notes (Signed)
 Mobility Specialist Progress Note:   08/05/23 0930  Mobility  Activity Ambulated with assistance in room  Level of Assistance Minimal assist, patient does 75% or more  Assistive Device None  Distance Ambulated (ft) 30 ft  Activity Response Tolerated well  Mobility Referral Yes  Mobility visit 1 Mobility  Mobility Specialist Start Time (ACUTE ONLY) 0930  Mobility Specialist Stop Time (ACUTE ONLY) 0945  Mobility Specialist Time Calculation (min) (ACUTE ONLY) 15 min   Pt agreeable to mobility session. Requested to go to BR. Required only supervision during ambulation, minA to rise from low toilet. Pt agreeable to sit in chair. Left with all needs met, SpO2 90% on RA, immediately after transfer.   Oneda Big Mobility Specialist Please contact via SecureChat or  Rehab office at [phone]

## 2023-08-06 DIAGNOSIS — K838 Other specified diseases of biliary tract: Secondary | ICD-10-CM | POA: Diagnosis not present

## 2023-08-06 DIAGNOSIS — I2694 Multiple subsegmental pulmonary emboli without acute cor pulmonale: Secondary | ICD-10-CM | POA: Diagnosis not present

## 2023-08-06 LAB — BASIC METABOLIC PANEL WITH GFR
Anion gap: 8 (ref 5–15)
BUN: 9 mg/dL (ref 8–23)
CO2: 23 mmol/L (ref 22–32)
Calcium: 7.6 mg/dL — ABNORMAL LOW (ref 8.9–10.3)
Chloride: 101 mmol/L (ref 98–111)
Creatinine, Ser: 1.15 mg/dL — ABNORMAL HIGH (ref 0.44–1.00)
GFR, Estimated: 48 mL/min — ABNORMAL LOW (ref >60)
Glucose, Bld: 80 mg/dL (ref 70–99)
Potassium: 3.3 mmol/L — ABNORMAL LOW (ref 3.5–5.1)
Sodium: 132 mmol/L — ABNORMAL LOW (ref 135–145)

## 2023-08-06 MED ORDER — POTASSIUM CHLORIDE 20 MEQ PO PACK
40.0000 meq | PACK | Freq: Once | ORAL | Status: AC
  Administered 2023-08-06: 40 meq via ORAL
  Filled 2023-08-06: qty 2

## 2023-08-06 NOTE — Assessment & Plan Note (Addendum)
 Responding well to Lasix . No acute exacerbation this admission. -- Continue diuretics as above -- Spironolactone  as above -- Strict I/O

## 2023-08-06 NOTE — Assessment & Plan Note (Deleted)
 Patient switched to DNR status this admission.  Plan to continue goals of care discussion.  Consider consulting palliative care.

## 2023-08-06 NOTE — TOC Progression Note (Addendum)
 Transition of Care [facility]) - Progression Note    Patient Details  Name: Diane Freeman MRN: [id number] Date of Birth: 1945-12-21  Transition of Care [facility]) CM/SW Contact  Arron Big, [place] Phone Number: 08/06/2023, 12:06 PM  Clinical Narrative:   CSW met patient at bedside and presented medicare.gov ratings for current bed offers. Patient chose [facility] as it is closest to her sister. Patient asked CSW to inform her sister of her choice, CSW called Patty and updated her. Russ Course is agreeable with this plan. CSW reached out to facility and awaiting to hear back from admissions.   1:24 PM [facility] admissions stated they can accept patient to facility tomorrow. CSW notified treatment team.   TOC will continue to follow.    Expected Discharge Plan: Skilled Nursing Facility Barriers to Discharge: Other (must enter comment) (Awaiting response from facility on potential dc today)  Expected Discharge Plan and Services In-house Referral: Clinical Social Work     Living arrangements for the past 2 months: Skilled Nursing Facility                                       Social Determinants of Health (SDOH) Interventions SDOH Screenings   Food Insecurity: No Food Insecurity (07/31/2023)  Housing: Low Risk  (07/31/2023)  Transportation Needs: No Transportation Needs (07/31/2023)  Utilities: Not At Risk (07/31/2023)  Alcohol Screen: Low Risk  (12/31/2022)  Depression (PHQ2-9): Medium Risk (06/14/2023)  Financial Resource Strain: Low Risk  (12/31/2022)  Physical Activity: Insufficiently Active (12/31/2022)  Social Connections: Moderately Isolated (07/31/2023)  Stress: No Stress Concern Present (12/31/2022)  Tobacco Use: Low Risk  (08/02/2023)  Health Literacy: Adequate Health Literacy (12/31/2022)    Readmission Risk Interventions    06/11/2023   11:24 AM  Readmission Risk Prevention Plan  Post Dischage Appt Complete  Medication Screening Complete  Transportation Screening  Complete

## 2023-08-06 NOTE — Assessment & Plan Note (Addendum)
 Patient remains hemodynamically stable, on room air. - Transitioned to Eliquis  5 mg twice daily yesterday

## 2023-08-06 NOTE — Plan of Care (Signed)
  Problem: Clinical Measurements: Goal: Ability to maintain clinical measurements within normal limits will improve Outcome: Progressing Goal: Will remain free from infection Outcome: Progressing Goal: Diagnostic test results will improve Outcome: Progressing Goal: Respiratory complications will improve Outcome: Progressing Goal: Cardiovascular complication will be avoided Outcome: Progressing   Problem: Elimination: Goal: Will not experience complications related to bowel motility Outcome: Progressing Goal: Will not experience complications related to urinary retention Outcome: Progressing   Problem: Pain Managment: Goal: General experience of comfort will improve and/or be controlled Outcome: Progressing   Problem: Safety: Goal: Ability to remain free from injury will improve Outcome: Progressing   Problem: Skin Integrity: Goal: Risk for impaired skin integrity will decrease Outcome: Progressing

## 2023-08-06 NOTE — Assessment & Plan Note (Addendum)
 Crohn's disease: Continue mesalamine  1.2 g daily Paroxysmal atrial fibrillation: Heart rate controlled on admission. Does not take rate control agents.  Continuing home Eliquis  5 mg BID. Normocytic anemia: Hgb stable in the low to mid 8s. Transfuse <7. GERD: continue home Protonix  40 mg BID

## 2023-08-06 NOTE — Assessment & Plan Note (Addendum)
 Stable.  Still of unclear origin.  No further workup anticipated inpatient. -- GI consulted, appreciate recommendations as below: -- Will need ERCP in the outpatient setting with GI, pending GOC and family preference -- Lasix  PO 20 mg daily -- Spironolactone  50 mg daily

## 2023-08-06 NOTE — Progress Notes (Signed)
 Mobility Specialist Progress Note:   08/06/23 0930  Mobility  Activity Transferred from bed to chair  Level of Assistance Contact guard assist, steadying assist  Assistive Device None  Distance Ambulated (ft) 5 ft  Activity Response Tolerated well  Mobility Referral Yes  Mobility visit 1 Mobility  Mobility Specialist Start Time (ACUTE ONLY) 0930  Mobility Specialist Stop Time (ACUTE ONLY) 0940  Mobility Specialist Time Calculation (min) (ACUTE ONLY) 10 min   Pt agreeable to mobility session. Required only minG to transfer to chair with no AD use. VSS on RA. Pt left with all needs met.   Oneda Big Mobility Specialist Please contact via SecureChat or  Rehab office at [phone]

## 2023-08-06 NOTE — Progress Notes (Signed)
 Daily Progress Note Intern Pager: [phone]  Patient name: Diane Freeman Medical record number: [id number] Date of birth: 1945/12/31 Age: 78 y.o. Gender: female  Primary Care Provider: Sarahann Cumins, DO Consultants: GI Code Status: DNR  Pt Overview and Major Events to Date:  4/30-admitted; paracentesis with 3.7 L out 5/4-transition from heparin  to Eliquis  BID 5/5-repeat abdominal ultrasound with moderate ascites, decreased from prior  Assessment and Plan: Diane Freeman is a 78 y.o. female with PMH dementia, GERD esophagitis, Crohn's disease on mesalamine , recent DVT on Eliquis , HFpEF, HLD, PAF, h/o recent sepsis 2/2 E coli bacteremia with unknown source who presented for new onset SOB and was found to have PE. Additionally found to have new ascites on admission with unclear cause, GI following. She is stable this morning and ready for discharge - most appropriate for SNF.  Assessment & Plan Pulmonary emboli [facility]) Patient remains hemodynamically stable, on room air. - Transitioned to Eliquis  5 mg twice daily yesterday Dilated bile duct  Ascites Stable.  Still of unclear origin.  No further workup anticipated inpatient. -- GI consulted, appreciate recommendations as below: -- Will need ERCP in the outpatient setting with GI, pending GOC and family preference -- Lasix  PO 20 mg daily -- Spironolactone  50 mg daily Diastolic CHF (HCC) Responding well to Lasix . No acute exacerbation this admission. -- Continue diuretics as above -- Spironolactone  as above -- Strict I/O Chronic health problem Crohn's disease: Continue mesalamine  1.2 g daily Paroxysmal atrial fibrillation: Heart rate controlled on admission. Does not take rate control agents.  Continuing home Eliquis  5 mg BID. Normocytic anemia: Hgb stable in the low to mid 8s. Transfuse <7. GERD: continue home Protonix  40 mg BID  FEN/GI: Heart healthy diet PPx: Eliquis  5 mg BID Dispo:SNF  pending placement  .  Subjective:  Patient seen this morning sitting up in bed with breakfast tray at bedside; she reports.  Denies any concerns.  She is hopeful to discharge to SNF soon.  Objective: Temp:  [97.3 F (36.3 C)-98.2 F (36.8 C)] 98.2 F (36.8 C) (05/06 0806) Pulse Rate:  [77-86] 86 (05/06 0806) Resp:  [20-21] 20 (05/06 0806) BP: (92-126)/(52-64) 92/52 (05/06 0806) SpO2:  [92 %-96 %] 96 % (05/06 0806) Weight:  [62.5 kg] 62.5 kg (05/06 0004) Physical Exam: General: Well-appearing, sitting up in bed, conversational Cardiovascular: RRR Respiratory: Normal work of breathing on room air Abdomen: Active bowel sounds, soft, nontender.  Mild distention consistent with exam yesterday. Extremities: Very minimal bilateral LE edema.  Moves all extremities equally.  Skin warm and dry.  Laboratory: Most recent CBC Lab Results  Component Value Date   WBC 2.9 (L) 08/04/2023   HGB 8.2 (L) 08/04/2023   HCT 25.3 (L) 08/04/2023   MCV 83.2 08/04/2023   PLT 134 (L) 08/04/2023   Most recent BMP    Latest Ref Rng & Units 08/06/2023    2:42 AM  BMP  Glucose 70 - 99 mg/dL 80   BUN 8 - 23 mg/dL 9   Creatinine 1.47 - 8.29 mg/dL 5.62   Sodium 130 - 865 mmol/L 132   Potassium 3.5 - 5.1 mmol/L 3.3   Chloride 98 - 111 mmol/L 101   CO2 22 - 32 mmol/L 23   Calcium  8.9 - 10.3 mg/dL 7.6    5/5 abdominal ultrasound: IMPRESSION: Moderate ascites, decreased compared to 08/02/2023 ultrasound. Limited exam. The hepatic arteries and veins as well as the portal veins were not visualized.  Omar Bibber, DO 08/06/2023,  11:30 AM  PGY-1, [facility] Family Medicine FPTS Intern pager: [phone], text pages welcome Secure chat group [facility] Teaching Service

## 2023-08-07 ENCOUNTER — Encounter: Admitting: Gastroenterology

## 2023-08-07 DIAGNOSIS — I509 Heart failure, unspecified: Secondary | ICD-10-CM | POA: Diagnosis not present

## 2023-08-07 DIAGNOSIS — I4891 Unspecified atrial fibrillation: Secondary | ICD-10-CM | POA: Diagnosis not present

## 2023-08-07 DIAGNOSIS — N179 Acute kidney failure, unspecified: Secondary | ICD-10-CM | POA: Diagnosis not present

## 2023-08-07 DIAGNOSIS — K5090 Crohn's disease, unspecified, without complications: Secondary | ICD-10-CM | POA: Diagnosis not present

## 2023-08-07 DIAGNOSIS — I2694 Multiple subsegmental pulmonary emboli without acute cor pulmonale: Secondary | ICD-10-CM | POA: Diagnosis not present

## 2023-08-07 DIAGNOSIS — E440 Moderate protein-calorie malnutrition: Secondary | ICD-10-CM | POA: Diagnosis not present

## 2023-08-07 DIAGNOSIS — Z7189 Other specified counseling: Secondary | ICD-10-CM | POA: Diagnosis not present

## 2023-08-07 DIAGNOSIS — R6521 Severe sepsis with septic shock: Secondary | ICD-10-CM | POA: Diagnosis not present

## 2023-08-07 DIAGNOSIS — K838 Other specified diseases of biliary tract: Secondary | ICD-10-CM | POA: Diagnosis not present

## 2023-08-07 DIAGNOSIS — R531 Weakness: Secondary | ICD-10-CM | POA: Diagnosis not present

## 2023-08-07 DIAGNOSIS — K209 Esophagitis, unspecified without bleeding: Secondary | ICD-10-CM | POA: Diagnosis not present

## 2023-08-07 DIAGNOSIS — R188 Other ascites: Secondary | ICD-10-CM | POA: Diagnosis not present

## 2023-08-07 DIAGNOSIS — I5030 Unspecified diastolic (congestive) heart failure: Secondary | ICD-10-CM | POA: Diagnosis not present

## 2023-08-07 DIAGNOSIS — Z789 Other specified health status: Secondary | ICD-10-CM | POA: Diagnosis not present

## 2023-08-07 DIAGNOSIS — D649 Anemia, unspecified: Secondary | ICD-10-CM | POA: Diagnosis not present

## 2023-08-07 DIAGNOSIS — K2100 Gastro-esophageal reflux disease with esophagitis, without bleeding: Secondary | ICD-10-CM | POA: Diagnosis not present

## 2023-08-07 DIAGNOSIS — K8050 Calculus of bile duct without cholangitis or cholecystitis without obstruction: Secondary | ICD-10-CM | POA: Diagnosis not present

## 2023-08-07 DIAGNOSIS — Z7401 Bed confinement status: Secondary | ICD-10-CM | POA: Diagnosis not present

## 2023-08-07 DIAGNOSIS — I2699 Other pulmonary embolism without acute cor pulmonale: Secondary | ICD-10-CM | POA: Diagnosis not present

## 2023-08-07 DIAGNOSIS — I480 Paroxysmal atrial fibrillation: Secondary | ICD-10-CM | POA: Diagnosis not present

## 2023-08-07 DIAGNOSIS — Z7901 Long term (current) use of anticoagulants: Secondary | ICD-10-CM | POA: Diagnosis not present

## 2023-08-07 DIAGNOSIS — G9341 Metabolic encephalopathy: Secondary | ICD-10-CM | POA: Diagnosis not present

## 2023-08-07 MED ORDER — APIXABAN 5 MG PO TABS: 5.0000 mg | ORAL_TABLET | Freq: Two times a day (BID) | ORAL | Status: DC

## 2023-08-07 MED ORDER — SPIRONOLACTONE 50 MG PO TABS: 50.0000 mg | ORAL_TABLET | Freq: Every day | ORAL | Status: DC

## 2023-08-07 MED ORDER — FUROSEMIDE 20 MG PO TABS: 20.0000 mg | ORAL_TABLET | Freq: Every day | ORAL | Status: DC

## 2023-08-07 NOTE — Progress Notes (Signed)
 Report called to [facility] spoke to [facility].  Pt will be be taken to the facility by [facility]

## 2023-08-07 NOTE — Plan of Care (Signed)

## 2023-08-07 NOTE — TOC Transition Note (Signed)
 Transition of Care [facility]) - Discharge Note   Patient Details  Name: Diane Freeman MRN: [id number] Date of Birth: Nov 19, 1945  Transition of Care [facility] CM/SW Contact:  Arron Big, LCSWA Phone Number: 08/07/2023, 11:33 AM   Clinical Narrative:   Patient will DC to: [facility] Anticipated DC date: 08/07/23 Family notified: Patty (sis) Transport by: Lyna Sandhoff   Per MD patient ready for DC to [facility]. RN to call report prior to discharge [phone] room [phone]). RN, patient, patient's family, and facility notified of DC. Discharge Summary and FL2 sent to facility. DC packet on chart. Ambulance transport requested for patient at 11:35 AM.   CSW will sign off for now as social work intervention is no longer needed. Please consult us  again if new needs arise.      Final next level of care: Skilled Nursing Facility Barriers to Discharge: Other (must enter comment) (Awaiting response from facility on potential dc today)   Patient Goals and CMS Choice Patient states their goals for this hospitalization and ongoing recovery are:: To return to SNF          Discharge Placement              Patient [facility] at: [facility] Patient to be transferred to facility by: [facility] Name of family member notified: Theodora Fish Patient and family notified of of transfer: 08/07/23  Discharge Plan and Services Additional resources added to the After Visit Summary for   In-house Referral: Clinical Social Work                                   Social Drivers of Health (SDOH) Interventions SDOH Screenings   Food Insecurity: No Food Insecurity (07/31/2023)  Housing: Low Risk  (07/31/2023)  Transportation Needs: No Transportation Needs (07/31/2023)  Utilities: Not At Risk (07/31/2023)  Alcohol Screen: Low Risk  (12/31/2022)  Depression (PHQ2-9): Medium Risk (06/14/2023)  Financial Resource Strain: Low Risk  (12/31/2022)  Physical Activity: Insufficiently Active  (12/31/2022)  Social Connections: Moderately Isolated (07/31/2023)  Stress: No Stress Concern Present (12/31/2022)  Tobacco Use: Low Risk  (08/02/2023)  Health Literacy: Adequate Health Literacy (12/31/2022)     Readmission Risk Interventions    06/11/2023   11:24 AM  Readmission Risk Prevention Plan  Post Dischage Appt Complete  Medication Screening Complete  Transportation Screening Complete

## 2023-08-07 NOTE — Care Management Important Message (Signed)
 Important Message  Patient Details  Name: Diane Freeman MRN: [id number] Date of Birth: 1945/09/26   Important Message Given:  Yes - Medicare IM     Janith Melnick 08/07/2023, 11:24 AM

## 2023-08-07 NOTE — Progress Notes (Signed)
 Physical Therapy Treatment Patient Details Name: Diane Freeman MRN: [id number] DOB: 06-11-1945 Today's Date: 08/07/2023   History of Present Illness Diane Freeman is a 78 y.o. female admitted 07/31/23 for SOB and chest/abdominal pain. CTPE demonstrated B PE, overall small clot burden. New ascites and CBD dilatation found on imaging. Pt underwent paracentesis which removed 3.7 L. PMH of dementia, GERD esophagitis, Crohn's disease on mesalamine , recent DVT on Eliquis , HFpEF, HLD, PAF, h/o recent sepsis and Ecoli bacteremia with unknown source.   PT Comments  Pt greeted supine in bed, pleasant and agreeable to PT session. She required CGA for safety with OOB mobility. Pt demonstrated reduced cardiopulmonary endurance ambulating ~62ft before requiring a seated rest break. She benefited from the introduction of a RW for increased stability/support. Questionable desaturation to 81-85% SpO2 on RA as pleth was unreliable during gait. RN notified. Will continue to follow acutely and advance appropriately.     If plan is discharge home, recommend the following: A little help with bathing/dressing/bathroom;Assistance with cooking/housework;Assist for transportation;Help with stairs or ramp for entrance;A lot of help with walking and/or transfers   Can travel by private vehicle     Yes  Equipment Recommendations  None recommended by PT    Recommendations for Other Services       Precautions / Restrictions Precautions Precautions: Fall Recall of Precautions/Restrictions: Impaired Restrictions Weight Bearing Restrictions Per Provider Order: No     Mobility  Bed Mobility Overal bed mobility: Needs Assistance Bed Mobility: Supine to Sit     Supine to sit: Supervision, HOB elevated, Used rails     General bed mobility comments: Pt sat up on L side of bed. HOB slightly raise and use of bedrail as well as pulling on PT.    Transfers Overall transfer level: Needs assistance Equipment  used: None, Rolling walker (2 wheels) Transfers: Sit to/from Stand Sit to Stand: Contact guard assist           General transfer comment: Pt stood from lowest bed height. Good eccentric control with sitting. VC for proper hand placement and sequencing using RW.    Ambulation/Gait Ambulation/Gait assistance: Contact guard assist Gait Distance (Feet): 60 Feet (x2, seated rest break between bouts) Assistive device: None, Rolling walker (2 wheels) Gait Pattern/deviations: Step-through pattern, Decreased stride length Gait velocity: reduced Gait velocity interpretation: <1.8 ft/sec, indicate of risk for recurrent falls   General Gait Details: Pt initially ambulated without AD. She was intermittently using handrail in hallway for additional support. Distance self-limited by pt reporting fatigue and LE weakness.  Seated rest break with cues for PLB. Introduced RW for [employer] and pt demonstrated improved stability. Pt lifted RW off ground when turning, cued pt to maintain all four points in contact with the floor at all times. Questionable desaturation with mobility. Pulse Oximeter read 81-85% SpO2, unreliable pleth. RN notified.   Stairs             Wheelchair Mobility     Tilt Bed    Modified Rankin (Stroke Patients Only)       Balance Overall balance assessment: Needs assistance Sitting-balance support: Feet supported, Bilateral upper extremity supported Sitting balance-Leahy Scale: Fair     Standing balance support: Bilateral upper extremity supported, During functional activity Standing balance-Leahy Scale: Fair Standing balance comment: Pt demonstrated improved balance using RW                            Communication  Communication Communication: No apparent difficulties  Cognition Arousal: Alert Behavior During Therapy: WFL for tasks assessed/performed   PT - Cognitive impairments: History of cognitive impairments (Dementia)                          Following commands: Intact      Cueing Cueing Techniques: Verbal cues  Exercises      General Comments General comments (skin integrity, edema, etc.): Reviewed incentive spirometer technique.      Pertinent Vitals/Pain Pain Assessment Pain Assessment: No/denies pain    Home Living                          Prior Function            PT Goals (current goals can now be found in the care plan section) Acute Rehab PT Goals Patient Stated Goal: Get better Progress towards PT goals: Progressing toward goals    Frequency    Min 2X/week      PT Plan      Co-evaluation              AM-PAC PT "6 Clicks" Mobility   Outcome Measure  Help needed turning from your back to your side while in a flat bed without using bedrails?: A Little Help needed moving from lying on your back to sitting on the side of a flat bed without using bedrails?: A Little Help needed moving to and from a bed to a chair (including a wheelchair)?: A Little Help needed standing up from a chair using your arms (e.g., wheelchair or bedside chair)?: A Little Help needed to walk in hospital room?: A Little Help needed climbing 3-5 steps with a railing? : A Little 6 Click Score: 18    End of Session Equipment Utilized During Treatment: Gait belt Activity Tolerance: Patient tolerated treatment well;Other (comment) (Treatment limited secondary to arrival of PTAR to take pt to SNF.) Patient left: in bed;with call bell/phone within reach Nurse Communication: Mobility status;Other (comment) (Possible desaturation with mobility but unreliable pleth) PT Visit Diagnosis: Unsteadiness on feet (R26.81);Other abnormalities of gait and mobility (R26.89);Difficulty in walking, not elsewhere classified (R26.2)     Time: 1610-9604 PT Time Calculation (min) (ACUTE ONLY): 13 min  Charges:    $Gait Training: 8-22 mins PT General Charges $$ ACUTE PT VISIT: 1 Visit                      Glenford Lanes, PT, DPT Acute Rehabilitation Services Office: [phone] Secure Chat Preferred  Riva Chester 08/07/2023, 12:35 PM

## 2023-08-07 NOTE — Progress Notes (Signed)
 Occupational Therapy Treatment Patient Details Name: Diane Freeman MRN: [id number] DOB: [birth year] Today's Date: 08/07/2023   History of present illness Diane Freeman is a 78 y.o. female admitted 07/31/23 for SOB and chest/abdominal pain. CTPE demonstrated B PE, overall small clot burden. New ascites and CBD dilatation found on imaging. Pt underwent paracentesis which removed 3.7 L. PMH of dementia, GERD esophagitis, Crohn's disease on mesalamine , recent DVT on Eliquis , HFpEF, HLD, PAF, h/o recent sepsis and Ecoli bacteremia with unknown source.   OT comments  Pt presented in bed and agreeable to session she asked multiple times in session about if being dc today and thought they were going home but was happy about going to SNF. Pt at this time completed hygiene tasks while sitting to stand at sink with CGA when standing and supervision for cueing on sequencing while sitting. Pt then requesting to use the bathroom and needed cues on how to use grab bars to complete sit to stand transfer with CGA. Patient will benefit from continued inpatient follow up therapy, <3 hours/day.      If plan is discharge home, recommend the following:  A little help with walking and/or transfers;A little help with bathing/dressing/bathroom;Assistance with cooking/housework;Assist for transportation;Help with stairs or ramp for entrance;Direct supervision/assist for financial management;Direct supervision/assist for medications management   Equipment Recommendations   (TBD)    Recommendations for Other Services      Precautions / Restrictions Precautions Precautions: Fall Recall of Precautions/Restrictions: Impaired Precaution/Restrictions Comments: symptomatic orthostatic hypotension Restrictions Weight Bearing Restrictions Per Provider Order: No       Mobility Bed Mobility Overal bed mobility: Needs Assistance Bed Mobility: Supine to Sit     Supine to sit: Supervision Sit to supine: Supervision         Transfers Overall transfer level: Needs assistance Equipment used: Rolling walker (2 wheels) Transfers: Sit to/from Stand Sit to Stand: Contact guard assist                 Balance Overall balance assessment: Needs assistance Sitting-balance support: Feet supported, Bilateral upper extremity supported Sitting balance-Leahy Scale: Fair     Standing balance support: Bilateral upper extremity supported Standing balance-Leahy Scale: Fair                             ADL either performed or assessed with clinical judgement   ADL Overall ADL's : Needs assistance/impaired Eating/Feeding: Independent;Sitting   Grooming: Wash/dry face;Wash/dry hands;Contact guard assist;Supervision/safety   Upper Body Bathing: Contact guard assist;Sitting   Lower Body Bathing: Minimal assistance;Cueing for safety;Cueing for sequencing   Upper Body Dressing : Contact guard assist;Sitting   Lower Body Dressing: Minimal assistance;Sit to/from stand;Sitting/lateral leans   Toilet Transfer: Contact guard assist;Cueing for sequencing;Cueing for safety;Rolling walker (2 wheels)   Toileting- Clothing Manipulation and Hygiene: Contact guard assist;Sit to/from stand;Adhering to back precautions Toileting - Clothing Manipulation Details (indicate cue type and reason): pt needed cues on where to place hands to pull to stadning position     Functional mobility during ADLs: Contact guard assist;Rolling walker (2 wheels);Cueing for sequencing;Cueing for safety      Extremity/Trunk Assessment Upper Extremity Assessment Upper Extremity Assessment: Defer to OT evaluation   Lower Extremity Assessment Lower Extremity Assessment: Defer to PT evaluation        Vision   Vision Assessment?: No apparent visual deficits   Perception Perception Perception: Not tested   Praxis     Communication Communication  Communication: No apparent difficulties   Cognition Arousal:  Alert Behavior During Therapy: WFL for tasks assessed/performed Cognition: No family/caregiver present to determine baseline             OT - Cognition Comments: pt asked twice in session about dc and where they are going                 Following commands: Intact        Cueing   Cueing Techniques: Verbal cues  Exercises      Shoulder Instructions       General Comments toileted  in session    Pertinent Vitals/ Pain       Pain Assessment Pain Assessment: No/denies pain  Home Living                                          Prior Functioning/Environment              Frequency  Min 1X/week        Progress Toward Goals  OT Goals(current goals can now be found in the care plan section)  Progress towards OT goals: Progressing toward goals  Acute Rehab OT Goals Patient Stated Goal: to go to rehab OT Goal Formulation: With patient Time For Goal Achievement: 08/16/23 Potential to Achieve Goals: Good ADL Goals Pt Will Perform Grooming: with supervision;standing Pt Will Perform Lower Body Bathing: with supervision;sit to/from stand Pt Will Perform Upper Body Dressing: with modified independence;sitting Pt Will Perform Lower Body Dressing: with supervision;sit to/from stand Pt Will Transfer to Toilet: with supervision;ambulating;bedside commode Pt Will Perform Toileting - Clothing Manipulation and hygiene: with supervision;sit to/from stand Pt Will Perform Tub/Shower Transfer: Shower transfer;with modified independence;ambulating Additional ADL Goal #1: pt will tolerate OOB activity x10 min with SpO2 above 90% in prep fro ADLs  Plan      Co-evaluation                 AM-PAC OT "6 Clicks" Daily Activity     Outcome Measure   Help from another person eating meals?: None Help from another person taking care of personal grooming?: A Little Help from another person toileting, which includes using toliet, bedpan, or urinal?: A  Little Help from another person bathing (including washing, rinsing, drying)?: A Little Help from another person to put on and taking off regular upper body clothing?: A Little Help from another person to put on and taking off regular lower body clothing?: A Little 6 Click Score: 19    End of Session Equipment Utilized During Treatment: Rolling walker (2 wheels);Gait belt  OT Visit Diagnosis: Unsteadiness on feet (R26.81);Other abnormalities of gait and mobility (R26.89);Muscle weakness (generalized) (M62.81);Other symptoms and signs involving cognitive function   Activity Tolerance Patient tolerated treatment well   Patient Left in bed;with call bell/phone within reach;with bed alarm set   Nurse Communication Mobility status        Time: 9147-8295 OT Time Calculation (min): 23 min  Charges: OT General Charges $OT Visit: 1 Visit OT Treatments $Self Care/Home Management : 23-37 mins  Erving Heather OTR/L  Acute Rehab Services  [phone] office number   Stevphen Elders 08/07/2023, 9:50 AM

## 2023-08-07 NOTE — Discharge Summary (Signed)
 Family Medicine [facility] Discharge Summary  Patient name: Diane Freeman Medical record number: [id number] Date of birth: Dec 12, 1945 Age: 78 y.o. Gender: female Date of Admission: 07/31/2023  Date of Discharge: 08/07/23 Admitting Physician: Kandis Ormond, DO  Primary Care Provider: Sarahann Cumins, DO Consultants: GI, Palliative   Indication for Hospitalization: Shortness of breath found to have PE, new onset ascites  Brief Hospital Course:  Diane Freeman is a 78 y.o.female with a history of paroxysmal A-fib, DVT on Eliquis , diastolic CHF, Crohn's disease, UGI, dementia  who was admitted to the [facility] Medicine Teaching Service at [facility] for pulmonary embolism and ascites. Her hospital course is detailed below:  Pulmonary emboli Bilateral pulmonary emboli on CTPE, overall small clot burden. EKG without ischemic findings. Trops negative. Patient started on heparin  per pharmacy, home eliquis  held initially. Echo showed LVEF 60-65% and grade 1 diastolic dysfunction, unchanged from prior echo in March 2025. BNP slightly elevated.  She was started on heparin  and her respiratory status did improve quickly.  By the time of discharge she was off of supplemental oxygen.  Patient did have large DVT on last admission and was discharged on Eliquis ; question if this is related to DVT clot break-up, or if this PE has been present for some time (patient did not have CT PE on last admission).  CBD dilatation  Ascites  CBD dilatation up to 9 mm with density along distal CBD, moderate-to-large volume upper abdominal ascites, anasarca, and mesenteric congestion seen on CT A&P.  GI consulted and recommended ERCP in the outpatient setting. Paracentesis performed and 2.7 L removed with symptomatic improvement. Abdominal US  obtained to evaluate for possible re accumulation of fluid, and this showed scattered ascites. Repeat US  3 days later with moderate ascites, though decreased compared to  prior US . Patient remained comfortable with stable mild distension, not requiring any additional tap prior to discharge.  Normocytic Anemia  Hemoglobin 8.6 on arrival (baseline 9-10). History of GI bleed in March 2025, found to have LA Grade D reflux esophagitis.  She did not have any evidence of active bleeding throughout her admission.  Other chronic conditions were medically managed with home medications and formulary alternatives as necessary (Afib, Crohn's disease)  PCP Follow-up Recommendations: Ensure good adherence to Eliquis  regimen Follow-up ERCP in the outpatient setting with GI   Discharge Diagnoses/Problem List:  Principal Problem:   Pulmonary emboli (HCC) Active Problems:   Diastolic CHF (HCC)   Normocytic anemia   Chronic health problem   Dilated bile duct  Ascites   Choledocholithiasis   Goals of care, counseling/discussion   Other ascites  Disposition: SNF  Discharge Condition: stable  Discharge Exam:  General: Well appearing, sitting up in bed with breakfast tray, NAD CV: RRR Resp: Normal work of breathing on room air Abdomen: Normoactive bowel sounds, soft, nontender Extremities: Moves all equally.  Skin warm and dry.  Significant Procedures:  5/2 Paracentesis with IR, 2.7L removed  Significant Labs and Imaging:  No results for input(s): "WBC", "HGB", "HCT", "PLT" in the last 48 hours. Recent Labs  Lab 08/06/23 0242  NA 132*  K 3.3*  CL 101  CO2 23  GLUCOSE 80  BUN 9  CREATININE 1.15*  CALCIUM  7.6*   4/30 CXR:  Pulmonary hypoinflation   4/30 CT A/P:  - CBD dilatation up to 9 mm with density along distal CBD.  - Moderate volume intraperitoneal free fluids.    4/30 CT PE:   - Bilateral nonoccluding pulmonary emboli  with overall small clot burden. No right heart strain.  - Minimal layering pleural effusions - Moderate-to-large hiatal hernia - Moderate-to-large volume upper abdominal ascites.  - Mesenteric congestive changes and body  wall anasarca  - Fluid reflux in esophagus nearly to thoracic inlet   4/30 Echo: LVEF 60-65%. Grade 1 diastolic dysfunction.  (Unchanged from prior echo in March 2025)  5/2 US  Ascites: IMPRESSION: Scattered ascites greatest in the left lower quadrant.  5/5 abdominal ultrasound: IMPRESSION: Moderate ascites, decreased compared to 08/02/2023 ultrasound. Limited exam. The hepatic arteries and veins as well as the portal veins were not visualized.  Results/Tests Pending at Time of Discharge: none  Discharge Medications:  Allergies as of 08/07/2023   No Known Allergies      Medication List     STOP taking these medications    hydrocortisone  1 % lotion   terbinafine 250 MG tablet Commonly known as: LAMISIL       TAKE these medications    apixaban  5 MG Tabs tablet Commonly known as: ELIQUIS  Take 1 tablet (5 mg total) by mouth 2 (two) times daily. What changed: See the new instructions.   ferrous sulfate  325 (65 FE) MG tablet Take 325 mg by mouth daily with breakfast.   folic acid  400 MCG tablet Commonly known as: FOLVITE  Take 400 mcg by mouth every morning.   furosemide  20 MG tablet Commonly known as: LASIX  Take 1 tablet (20 mg total) by mouth daily. Start taking on: Aug 08, 2023 What changed:  medication strength how much to take when to take this additional instructions   mesalamine  0.375 g 24 hr capsule Commonly known as: APRISO  Take 4 capsules (1.5 g total) by mouth daily. Please keep your appointment in July for any further refills. Thank you What changed: additional instructions   nystatin  cream Commonly known as: MYCOSTATIN  Apply 1 Application topically 2 (two) times daily. Apply to breast and abdomen   pantoprazole  40 MG tablet Commonly known as: PROTONIX  TAKE 1 TABLET BY MOUTH TWICE A DAY   spironolactone  50 MG tablet Commonly known as: ALDACTONE  Take 1 tablet (50 mg total) by mouth daily. Start taking on: Aug 08, 2023   Vitamin D3 Super  Strength 50 MCG (2000 UT) tablet Generic drug: Cholecalciferol Take 2,000 Units by mouth every morning.        Discharge Instructions: Please refer to Patient Instructions section of EMR for full details.  Patient was counseled important signs and symptoms that should prompt return to medical care, changes in medications, dietary instructions, activity restrictions, and follow up appointments.   Follow-Up Appointments:  Contact information for after-discharge care     Destination     [facility] .   Service: Skilled Nursing Contact information: [address] [place]  [address] [phone]                     Omar Bibber, DO 08/07/2023, 11:08 AM PGY-1, [facility] Family [facility]

## 2023-08-08 DIAGNOSIS — I4891 Unspecified atrial fibrillation: Secondary | ICD-10-CM | POA: Diagnosis not present

## 2023-08-08 DIAGNOSIS — I509 Heart failure, unspecified: Secondary | ICD-10-CM | POA: Diagnosis not present

## 2023-08-08 DIAGNOSIS — K838 Other specified diseases of biliary tract: Secondary | ICD-10-CM | POA: Diagnosis not present

## 2023-08-08 DIAGNOSIS — I2699 Other pulmonary embolism without acute cor pulmonale: Secondary | ICD-10-CM | POA: Diagnosis not present

## 2023-08-08 DIAGNOSIS — D649 Anemia, unspecified: Secondary | ICD-10-CM | POA: Diagnosis not present

## 2023-08-08 DIAGNOSIS — K2100 Gastro-esophageal reflux disease with esophagitis, without bleeding: Secondary | ICD-10-CM | POA: Diagnosis not present

## 2023-08-08 DIAGNOSIS — Z7901 Long term (current) use of anticoagulants: Secondary | ICD-10-CM | POA: Diagnosis not present

## 2023-08-08 DIAGNOSIS — K5090 Crohn's disease, unspecified, without complications: Secondary | ICD-10-CM | POA: Diagnosis not present

## 2023-08-08 DIAGNOSIS — R188 Other ascites: Secondary | ICD-10-CM | POA: Diagnosis not present

## 2023-08-09 NOTE — Progress Notes (Signed)
 [facility] Liaison Note  08/09/2023  [name] [birth year] [id number]  Location: RN Hospital Liaison screened the patient remotely at [facility].  Insurance: Medicare   Diane Freeman is a 78 y.o. female who is a Primary Care Patient of Everhart, Prudencio Browner, DO  [facility]. The patient was screened for 30 day readmission hospitalization with noted high risk score for unplanned readmission risk with 3 IP/1 ED in 6 months.  The patient was assessed for potential Care Management service needs for post hospital transition for care coordination. Review of patient's electronic medical record reveals patient was admitted for other acute pulmonary embolism without acute cor pulmonale.  Pt transition to [facility]. This facility will continue to address pt's ongoing needs post discharge. No VBCI needs at this time.   VBCI Care Management/Population Health does not replace or interfere with any arrangements made by the Inpatient Transition of Care team.   For questions contact:   Lilla Reichert, RN, BSN Hospital Liaison    [facility], Population Health Office Hours MTWF  8:00 am-6:00 pm Direct Dial: [phone] mobile [email or web address]

## 2023-08-15 ENCOUNTER — Telehealth: Payer: Self-pay

## 2023-08-15 DIAGNOSIS — Z7189 Other specified counseling: Secondary | ICD-10-CM

## 2023-08-15 NOTE — Telephone Encounter (Signed)
 VBCI referral placed for complex care management

## 2023-08-15 NOTE — Telephone Encounter (Signed)
 Patient's sister, Russ Course, calls nurse line regarding upcoming discharge from [facility] rehab facility. Reports discharge date of 08/19/23. She is asking for assistance for in home care. She is working with Child psychotherapist at [employer] to get [employer] through [employer].   She is asking if our office would also be able to assist with care within the home.   Will send message to PCP for care management referral, as their time may be helpful in navigating at home care.   Elsie Halo, RN

## 2023-08-16 ENCOUNTER — Telehealth: Payer: Self-pay | Admitting: *Deleted

## 2023-08-16 NOTE — Progress Notes (Signed)
 Complex Care Management Note  Care Guide Note 08/16/2023 Name: IMARIA GALUS MRN: [id number] DOB: 25-Oct-1945  Parley Bolls is a 78 y.o. year old female who sees Everhart, Kirstie, DO for primary care. I reached out to Kerianne I [place] by phone today to offer complex care management services.  Ms. Tauer was given information about Complex Care Management services today including:   The Complex Care Management services include support from the care team which includes your Nurse Care Manager, Clinical Social Worker, or Pharmacist.  The Complex Care Management team is here to help remove barriers to the health concerns and goals most important to you. Complex Care Management services are voluntary, and the patient may decline or stop services at any time by request to their care team member.   Complex Care Management Consent Status: Patient agreed to services and verbal consent obtained.   Follow up plan:  Telephone appointment with complex care management team member scheduled for:  5/20  Encounter Outcome:  Patient Scheduled  Barnie Bora  [facility]  [facility], [facility] Guide  Direct Dial: [phone]  Fax [phone]

## 2023-08-19 DIAGNOSIS — G9341 Metabolic encephalopathy: Secondary | ICD-10-CM | POA: Diagnosis not present

## 2023-08-19 DIAGNOSIS — R188 Other ascites: Secondary | ICD-10-CM | POA: Diagnosis not present

## 2023-08-20 ENCOUNTER — Other Ambulatory Visit: Payer: Self-pay

## 2023-08-20 NOTE — Patient Outreach (Signed)
 Complex Care Management   Visit Note  08/20/2023  Name:  Diane Freeman MRN: [id number] DOB: Jun 22, 1945  Situation: Referral received for Complex Care Management related to SDOH Barriers:  Financial Resource Strain  I obtained verbal consent from Patient.  Visit completed with patient  on the phone  Assessment: BSW outreached the patient to assist with SDOH barriers. BSW identified challenges with financial strain. BSW was able to send resources to patient and will follow up in 30 days.       Recommendation:   No recommendations at this time.   Follow Up Plan:   Telephone follow-up 09/10/2023 at 1pm  [place], BSW [place]  [facility] Social Worker, [employer] [facility] [phone]

## 2023-08-20 NOTE — Patient Instructions (Signed)
 Visit Information  Thank you for taking time to visit with me today. Please don't hesitate to contact me if I can be of assistance to you before our next scheduled appointment.  Our next appointment is by telephone on 09/10/2023 at 1:00 pm Please call the care guide team at [phone] if you need to cancel or reschedule your appointment.     Please call the Suicide and Crisis Lifeline: 988 call the [place]  National Suicide Prevention Lifeline: 909-683-8162 or TTY: (858)444-9435 TTY 838-478-0536) to talk to a trained counselor call 1-800-273-TALK (toll free, 24 hour hotline) call 911 if you are experiencing a Mental Health or Behavioral Health Crisis or need someone to talk to.  Patient verbalizes understanding of instructions and care plan provided today and agrees to view in MyChart. Active MyChart status and patient understanding of how to access instructions and care plan via MyChart confirmed with patient.     Haven Lion, BSW [place]  [facility] Social Worker, [employer] [facility] [phone]

## 2023-08-21 NOTE — Progress Notes (Signed)
    SUBJECTIVE:   CHIEF COMPLAINT / HPI:   Presented for general follow up since recent hospitalization and rehab stay Left short term rehab around 4 days ago Needs clarification on medications, has not had eliquis  in 2 days  Wonders if she needs to stop eliquis  prior to endoscopy coming up in June  Sister present for visit. Concerned regarding her ability to care for herself. She lives alone, sister and family visits frequently. However she is unable to walk more than short distances without getting short of breath. She also has dementia. Difficulty caring for herself, eating, taking medications.   Was supposed to have walker but did not receive after she left rehab  Pt has also been having issues with urinary incontinence. Frequently has to get up during the night and it happens during the day as well. Pt has been waering pull ups.   No new SOB, chest pain, or any other concerns today.   PERTINENT  PMH / PSH: paroxysmal afib, CHF, pulmonary emboli, GERD, dementia, HLD, crohn's disease  OBJECTIVE:   BP 129/66   Pulse 71   Ht 5\' 3"  (1.6 m)   Wt 129 lb (58.5 kg)   SpO2 99%   BMI 22.85 kg/m   General: Chronically ill appearing. No acute distress.  Cardiovascular: RRR, no m/r/g Respiratory: normal work of breathing on RA, CTAB Ambulating independently holding onto wall  ASSESSMENT/PLAN:   Assessment & Plan Urinary incontinence, unspecified type DME order for incontinence pads placed Shortness of breath on exertion Stable, DME order for rollator placed today for assistance with ambulation Dementia without behavioral disturbance, psychotic disturbance, mood disturbance, or anxiety, unspecified dementia severity, unspecified dementia type (HCC) Pt is having difficulty caring for herself at home but unwilling to go to long term care facility. Sister helps significantly and other family members check on her as well but would benefit from a home health aide or nursing to help her  with medications at least. Placed referral today and reached out to [employer] agent from most recent note to see if they can provide PCS resources.  She has lost 8 lbs this month. Sister reports that pt doesn't eat much but pt denies. Will need to continue to monitor.    Sent staff message to Dr. General Kenner regarding when pt should stop Eliquis  prior to her GI procedure coming up  Sarahann Cumins, DO [facility]

## 2023-08-22 ENCOUNTER — Ambulatory Visit: Admitting: Family Medicine

## 2023-08-22 ENCOUNTER — Encounter: Payer: Self-pay | Admitting: Family Medicine

## 2023-08-22 VITALS — BP 129/66 | HR 71 | Ht 63.0 in | Wt 129.0 lb

## 2023-08-22 DIAGNOSIS — R32 Unspecified urinary incontinence: Secondary | ICD-10-CM

## 2023-08-22 DIAGNOSIS — R0602 Shortness of breath: Secondary | ICD-10-CM | POA: Diagnosis not present

## 2023-08-22 DIAGNOSIS — F0390 Unspecified dementia without behavioral disturbance: Secondary | ICD-10-CM | POA: Diagnosis not present

## 2023-08-22 MED ORDER — CHOLECALCIFEROL 50 MCG (2000 UT) PO TABS: 2000.0000 [IU] | ORAL_TABLET | Freq: Every morning | ORAL | 3 refills | Status: AC

## 2023-08-22 MED ORDER — FOLIC ACID 400 MCG PO TABS: 400.0000 ug | ORAL_TABLET | Freq: Every morning | ORAL | 3 refills | Status: AC

## 2023-08-22 MED ORDER — FERROUS SULFATE 325 (65 FE) MG PO TABS: 325.0000 mg | ORAL_TABLET | Freq: Every day | ORAL | 3 refills | Status: AC

## 2023-08-22 MED ORDER — PANTOPRAZOLE SODIUM 40 MG PO TBEC: 40.0000 mg | DELAYED_RELEASE_TABLET | Freq: Two times a day (BID) | ORAL | 0 refills | Status: AC

## 2023-08-22 MED ORDER — APIXABAN 5 MG PO TABS: 5.0000 mg | ORAL_TABLET | Freq: Two times a day (BID) | ORAL | 3 refills | Status: AC

## 2023-08-22 MED ORDER — APIXABAN 5 MG PO TABS: 5.0000 mg | ORAL_TABLET | Freq: Two times a day (BID) | ORAL | Status: DC

## 2023-08-22 NOTE — Patient Instructions (Signed)
 Good to see you today - Thank you for coming in  Things we discussed today: I am going to put in a referral to home health. I have updated your medicine list and sent in refills for everything except the mesalamine  I will let you know if I unable to get in touch with the GI doctor about your Eliquis  I will also see if we can get a walker and pull-ups.  I will call you or message you on MyChart to let you know   Please always bring your medication bottles  Come back to see me in 3 months

## 2023-08-22 NOTE — Assessment & Plan Note (Signed)
 Pt is having difficulty caring for herself at home but unwilling to go to long term care facility. Sister helps significantly and other family members check on her as well but would benefit from a home health aide or nursing to help her with medications at least. Placed referral today and reached out to [employer] agent from most recent note to see if they can provide PCS resources.  She has lost 8 lbs this month. Sister reports that pt doesn't eat much but pt denies. Will need to continue to monitor.

## 2023-08-23 ENCOUNTER — Encounter: Payer: Self-pay | Admitting: Family Medicine

## 2023-08-27 ENCOUNTER — Telehealth: Payer: Self-pay

## 2023-08-27 NOTE — Telephone Encounter (Signed)
 Community message sent to [employer] for Rollator and incontinence supplies.   Will await response.   Elsie Halo, RN

## 2023-08-27 NOTE — Telephone Encounter (Signed)
 Receipt confirmed by [employer].   Veronda Prude, RN

## 2023-08-29 ENCOUNTER — Telehealth: Payer: Self-pay | Admitting: Family Medicine

## 2023-08-29 NOTE — Telephone Encounter (Addendum)
 Received message that patient's Sister Russ Course stopped by clinic requesting a call from me.  [employer], patient's sister, and she voiced several concerns regarding patient.  States that she has been having up to 8 episodes of diarrhea a day over the last few days, has been urinating on herself more frequently than usual, and has fallen off of the toilet twice.  She is unable to get patient to eat anything. Inquiring about long-term care for patient.  Given her recent symptom development and medical history I recommended she go to the emergency room to get evaluated.  Sister agrees with plan and will take her to the ER.

## 2023-08-31 ENCOUNTER — Encounter (HOSPITAL_COMMUNITY): Payer: Self-pay

## 2023-08-31 ENCOUNTER — Emergency Department (HOSPITAL_COMMUNITY)

## 2023-08-31 ENCOUNTER — Inpatient Hospital Stay (HOSPITAL_COMMUNITY)
Admission: EM | Admit: 2023-08-31 | Discharge: 2023-09-05 | DRG: 391 | Disposition: A | Discharge status: Skilled Nursing Facility | Attending: Family Medicine | Admitting: Family Medicine

## 2023-08-31 DIAGNOSIS — K7581 Nonalcoholic steatohepatitis (NASH): Secondary | ICD-10-CM | POA: Diagnosis present

## 2023-08-31 DIAGNOSIS — Z8719 Personal history of other diseases of the digestive system: Secondary | ICD-10-CM | POA: Diagnosis not present

## 2023-08-31 DIAGNOSIS — F03A0 Unspecified dementia, mild, without behavioral disturbance, psychotic disturbance, mood disturbance, and anxiety: Secondary | ICD-10-CM | POA: Diagnosis not present

## 2023-08-31 DIAGNOSIS — Z8673 Personal history of transient ischemic attack (TIA), and cerebral infarction without residual deficits: Secondary | ICD-10-CM

## 2023-08-31 DIAGNOSIS — A09 Infectious gastroenteritis and colitis, unspecified: Secondary | ICD-10-CM | POA: Diagnosis present

## 2023-08-31 DIAGNOSIS — R188 Other ascites: Secondary | ICD-10-CM | POA: Diagnosis not present

## 2023-08-31 DIAGNOSIS — Z7901 Long term (current) use of anticoagulants: Secondary | ICD-10-CM

## 2023-08-31 DIAGNOSIS — R6889 Other general symptoms and signs: Secondary | ICD-10-CM | POA: Diagnosis present

## 2023-08-31 DIAGNOSIS — I10 Essential (primary) hypertension: Secondary | ICD-10-CM | POA: Diagnosis not present

## 2023-08-31 DIAGNOSIS — E785 Hyperlipidemia, unspecified: Secondary | ICD-10-CM | POA: Diagnosis not present

## 2023-08-31 DIAGNOSIS — Z79899 Other long term (current) drug therapy: Secondary | ICD-10-CM | POA: Diagnosis not present

## 2023-08-31 DIAGNOSIS — K639 Disease of intestine, unspecified: Secondary | ICD-10-CM | POA: Diagnosis present

## 2023-08-31 DIAGNOSIS — L89302 Pressure ulcer of unspecified buttock, stage 2: Secondary | ICD-10-CM | POA: Diagnosis present

## 2023-08-31 DIAGNOSIS — I2694 Multiple subsegmental pulmonary emboli without acute cor pulmonale: Secondary | ICD-10-CM | POA: Diagnosis not present

## 2023-08-31 DIAGNOSIS — I2699 Other pulmonary embolism without acute cor pulmonale: Secondary | ICD-10-CM | POA: Diagnosis not present

## 2023-08-31 DIAGNOSIS — Z6823 Body mass index (BMI) 23.0-23.9, adult: Secondary | ICD-10-CM

## 2023-08-31 DIAGNOSIS — E860 Dehydration: Secondary | ICD-10-CM | POA: Diagnosis not present

## 2023-08-31 DIAGNOSIS — Z7401 Bed confinement status: Secondary | ICD-10-CM | POA: Diagnosis not present

## 2023-08-31 DIAGNOSIS — F0390 Unspecified dementia without behavioral disturbance: Secondary | ICD-10-CM | POA: Diagnosis not present

## 2023-08-31 DIAGNOSIS — I82411 Acute embolism and thrombosis of right femoral vein: Secondary | ICD-10-CM | POA: Diagnosis not present

## 2023-08-31 DIAGNOSIS — I110 Hypertensive heart disease with heart failure: Secondary | ICD-10-CM | POA: Diagnosis not present

## 2023-08-31 DIAGNOSIS — K50911 Crohn's disease, unspecified, with rectal bleeding: Principal | ICD-10-CM | POA: Diagnosis present

## 2023-08-31 DIAGNOSIS — E440 Moderate protein-calorie malnutrition: Secondary | ICD-10-CM | POA: Diagnosis present

## 2023-08-31 DIAGNOSIS — Z86718 Personal history of other venous thrombosis and embolism: Secondary | ICD-10-CM | POA: Diagnosis not present

## 2023-08-31 DIAGNOSIS — R2689 Other abnormalities of gait and mobility: Secondary | ICD-10-CM | POA: Diagnosis not present

## 2023-08-31 DIAGNOSIS — R1310 Dysphagia, unspecified: Secondary | ICD-10-CM | POA: Diagnosis not present

## 2023-08-31 DIAGNOSIS — I480 Paroxysmal atrial fibrillation: Secondary | ICD-10-CM | POA: Diagnosis not present

## 2023-08-31 DIAGNOSIS — R4182 Altered mental status, unspecified: Secondary | ICD-10-CM | POA: Diagnosis not present

## 2023-08-31 DIAGNOSIS — I5032 Chronic diastolic (congestive) heart failure: Secondary | ICD-10-CM | POA: Diagnosis present

## 2023-08-31 DIAGNOSIS — K219 Gastro-esophageal reflux disease without esophagitis: Secondary | ICD-10-CM | POA: Diagnosis not present

## 2023-08-31 DIAGNOSIS — R531 Weakness: Secondary | ICD-10-CM | POA: Diagnosis not present

## 2023-08-31 DIAGNOSIS — E8809 Other disorders of plasma-protein metabolism, not elsewhere classified: Secondary | ICD-10-CM | POA: Diagnosis present

## 2023-08-31 DIAGNOSIS — K6389 Other specified diseases of intestine: Secondary | ICD-10-CM | POA: Diagnosis not present

## 2023-08-31 DIAGNOSIS — R634 Abnormal weight loss: Secondary | ICD-10-CM | POA: Diagnosis present

## 2023-08-31 DIAGNOSIS — K649 Unspecified hemorrhoids: Secondary | ICD-10-CM | POA: Diagnosis present

## 2023-08-31 DIAGNOSIS — K5730 Diverticulosis of large intestine without perforation or abscess without bleeding: Secondary | ICD-10-CM | POA: Diagnosis not present

## 2023-08-31 DIAGNOSIS — K5090 Crohn's disease, unspecified, without complications: Secondary | ICD-10-CM | POA: Diagnosis not present

## 2023-08-31 DIAGNOSIS — Z9049 Acquired absence of other specified parts of digestive tract: Secondary | ICD-10-CM

## 2023-08-31 DIAGNOSIS — K449 Diaphragmatic hernia without obstruction or gangrene: Secondary | ICD-10-CM | POA: Diagnosis not present

## 2023-08-31 DIAGNOSIS — R197 Diarrhea, unspecified: Principal | ICD-10-CM

## 2023-08-31 DIAGNOSIS — Z789 Other specified health status: Secondary | ICD-10-CM

## 2023-08-31 DIAGNOSIS — K7460 Unspecified cirrhosis of liver: Secondary | ICD-10-CM | POA: Diagnosis not present

## 2023-08-31 DIAGNOSIS — R627 Adult failure to thrive: Secondary | ICD-10-CM | POA: Diagnosis not present

## 2023-08-31 DIAGNOSIS — K644 Residual hemorrhoidal skin tags: Secondary | ICD-10-CM | POA: Diagnosis not present

## 2023-08-31 DIAGNOSIS — R8281 Pyuria: Secondary | ICD-10-CM | POA: Diagnosis present

## 2023-08-31 DIAGNOSIS — R161 Splenomegaly, not elsewhere classified: Secondary | ICD-10-CM | POA: Diagnosis not present

## 2023-08-31 DIAGNOSIS — Z86711 Personal history of pulmonary embolism: Secondary | ICD-10-CM

## 2023-08-31 DIAGNOSIS — R5381 Other malaise: Secondary | ICD-10-CM | POA: Diagnosis present

## 2023-08-31 DIAGNOSIS — Z87898 Personal history of other specified conditions: Secondary | ICD-10-CM

## 2023-08-31 DIAGNOSIS — Z808 Family history of malignant neoplasm of other organs or systems: Secondary | ICD-10-CM

## 2023-08-31 DIAGNOSIS — I5030 Unspecified diastolic (congestive) heart failure: Secondary | ICD-10-CM | POA: Diagnosis not present

## 2023-08-31 DIAGNOSIS — Z751 Person awaiting admission to adequate facility elsewhere: Secondary | ICD-10-CM | POA: Diagnosis not present

## 2023-08-31 DIAGNOSIS — Z803 Family history of malignant neoplasm of breast: Secondary | ICD-10-CM

## 2023-08-31 DIAGNOSIS — I82419 Acute embolism and thrombosis of unspecified femoral vein: Secondary | ICD-10-CM | POA: Diagnosis present

## 2023-08-31 DIAGNOSIS — D649 Anemia, unspecified: Secondary | ICD-10-CM | POA: Diagnosis not present

## 2023-08-31 DIAGNOSIS — R5383 Other fatigue: Secondary | ICD-10-CM | POA: Diagnosis not present

## 2023-08-31 DIAGNOSIS — Z800 Family history of malignant neoplasm of digestive organs: Secondary | ICD-10-CM

## 2023-08-31 HISTORY — DX: Diarrhea, unspecified: R19.7

## 2023-08-31 LAB — CBC WITH DIFFERENTIAL/PLATELET
Abs Immature Granulocytes: 0.03 10*3/uL (ref 0.00–0.07)
Basophils Absolute: 0 10*3/uL (ref 0.0–0.1)
Basophils Relative: 1 %
Eosinophils Absolute: 0.1 10*3/uL (ref 0.0–0.5)
Eosinophils Relative: 1 %
HCT: 32.1 % — ABNORMAL LOW (ref 36.0–46.0)
Hemoglobin: 10.2 g/dL — ABNORMAL LOW (ref 12.0–15.0)
Immature Granulocytes: 1 %
Lymphocytes Relative: 16 %
Lymphs Abs: 0.9 10*3/uL (ref 0.7–4.0)
MCH: 27.2 pg (ref 26.0–34.0)
MCHC: 31.8 g/dL (ref 30.0–36.0)
MCV: 85.6 fL (ref 80.0–100.0)
Monocytes Absolute: 0.4 10*3/uL (ref 0.1–1.0)
Monocytes Relative: 8 %
Neutro Abs: 4.1 10*3/uL (ref 1.7–7.7)
Neutrophils Relative %: 73 %
Platelets: 179 10*3/uL (ref 150–400)
RBC: 3.75 MIL/uL — ABNORMAL LOW (ref 3.87–5.11)
RDW: 19.3 % — ABNORMAL HIGH (ref 11.5–15.5)
WBC: 5.5 10*3/uL (ref 4.0–10.5)
nRBC: 0 % (ref 0.0–0.2)

## 2023-08-31 LAB — COMPREHENSIVE METABOLIC PANEL WITH GFR
ALT: 17 U/L (ref 0–44)
AST: 22 U/L (ref 15–41)
Albumin: 2.1 g/dL — ABNORMAL LOW (ref 3.5–5.0)
Alkaline Phosphatase: 97 U/L (ref 38–126)
Anion gap: 8 (ref 5–15)
BUN: 10 mg/dL (ref 8–23)
CO2: 23 mmol/L (ref 22–32)
Calcium: 7.7 mg/dL — ABNORMAL LOW (ref 8.9–10.3)
Chloride: 106 mmol/L (ref 98–111)
Creatinine, Ser: 0.88 mg/dL (ref 0.44–1.00)
GFR, Estimated: >60 mL/min (ref >60)
Glucose, Bld: 104 mg/dL — ABNORMAL HIGH (ref 70–99)
Potassium: 3 mmol/L — ABNORMAL LOW (ref 3.5–5.1)
Sodium: 137 mmol/L (ref 135–145)
Total Bilirubin: 1.1 mg/dL (ref 0.0–1.2)
Total Protein: 6.5 g/dL (ref 6.5–8.1)

## 2023-08-31 LAB — MAGNESIUM: Magnesium: 1.6 mg/dL — ABNORMAL LOW (ref 1.7–2.4)

## 2023-08-31 LAB — PHOSPHORUS: Phosphorus: 2.7 mg/dL (ref 2.5–4.6)

## 2023-08-31 MED ORDER — POTASSIUM CHLORIDE CRYS ER 20 MEQ PO TBCR
40.0000 meq | EXTENDED_RELEASE_TABLET | Freq: Once | ORAL | Status: AC
  Administered 2023-08-31: 40 meq via ORAL
  Filled 2023-08-31: qty 2

## 2023-08-31 MED ORDER — IOHEXOL 350 MG/ML SOLN
75.0000 mL | Freq: Once | INTRAVENOUS | Status: AC | PRN
Start: 2023-08-31 — End: 2023-08-31
  Administered 2023-08-31: 75 mL via INTRAVENOUS

## 2023-08-31 MED ORDER — MESALAMINE 1.2 G PO TBEC
2.4000 g | DELAYED_RELEASE_TABLET | Freq: Every day | ORAL | Status: DC
  Administered 2023-09-02 – 2023-09-05 (×4): 2.4 g via ORAL
  Filled 2023-08-31 (×5): qty 2

## 2023-08-31 MED ORDER — SODIUM CHLORIDE 0.9 % IV BOLUS
500.0000 mL | Freq: Once | INTRAVENOUS | Status: AC
  Administered 2023-08-31: 500 mL via INTRAVENOUS

## 2023-08-31 MED ORDER — PANTOPRAZOLE SODIUM 40 MG IV SOLR: 40.0000 mg | INTRAVENOUS | Status: DC

## 2023-08-31 MED ORDER — MAGNESIUM SULFATE 2 GM/50ML IV SOLN
2.0000 g | Freq: Once | INTRAVENOUS | Status: AC
  Administered 2023-08-31: 2 g via INTRAVENOUS
  Filled 2023-08-31: qty 50

## 2023-08-31 MED ORDER — SODIUM CHLORIDE 0.9 % IV SOLN: INTRAVENOUS | Status: AC

## 2023-08-31 MED ORDER — ACETAMINOPHEN 325 MG PO TABS
650.0000 mg | ORAL_TABLET | Freq: Once | ORAL | Status: AC
  Administered 2023-08-31: 650 mg via ORAL
  Filled 2023-08-31: qty 2

## 2023-08-31 MED ORDER — MESALAMINE ER 0.375 G PO CP24: 1.5000 g | ORAL_CAPSULE | Freq: Every day | ORAL | Status: DC

## 2023-08-31 MED ORDER — PANTOPRAZOLE SODIUM 40 MG PO TBEC
40.0000 mg | DELAYED_RELEASE_TABLET | Freq: Two times a day (BID) | ORAL | Status: DC
  Administered 2023-08-31 – 2023-09-05 (×10): 40 mg via ORAL
  Filled 2023-08-31 (×10): qty 1

## 2023-08-31 MED ORDER — HEPARIN (PORCINE) 25000 UT/250ML-% IV SOLN
1000.0000 [IU]/h | INTRAVENOUS | Status: DC
  Administered 2023-08-31: 850 [IU]/h via INTRAVENOUS
  Administered 2023-09-02: 1000 [IU]/h via INTRAVENOUS
  Filled 2023-08-31 (×2): qty 250

## 2023-08-31 NOTE — ED Provider Notes (Signed)
  EMERGENCY DEPARTMENT AT [facility] Provider Note   CSN: [id number] Arrival date & time: 08/31/23  1509     History  Chief Complaint  Patient presents with   Diarrhea    Sharrie I Motz is a 78 y.o. female.  IVYONNA HOELZEL is 78 y.o. with pertinent PMHx of paroxysmal A-fib, DVT on Eliquis , diastolic CHF, Crohn's disease, UGI, dementia, Ascites of unknown origin presenting with diarrhea ongoing for a week.   She reports she has had 2 to 3 days of diarrhea that is watery.  She did have some emesis yesterday.  Both were nonbloody.  Reports that she has been taking her medications including her Eliquis .  Otherwise she denies any symptoms outside of decreased appetite since she was hospitalized.  Eating less.  No fevers or dyspnea.  Reports that she has some belly tightness, but this has been ongoing since her last hospitalization.  Does not feel that her belly swelling is worsened.  Called patient's sister for further collateral.  She reports that the patient has had diarrhea ongoing for a week.  She reports it is black.  Reports that she also has been chronically weak since being discharged from SNF on the 20th.  She has had difficulty moving getting around her mobile home.  Lives alone.  Reemphasizes her decreased appetite and decreased p.o. intake.  Sister called 911 to her house because she was worried about her.      The history is provided by the patient and a relative.       Home Medications Prior to Admission medications   Medication Sig Start Date End Date Taking? Authorizing Provider  apixaban  (ELIQUIS ) 5 MG TABS tablet Take 1 tablet (5 mg total) by mouth 2 (two) times daily. 08/22/23   Everhart, Kirstie, DO  apixaban  (ELIQUIS ) 5 MG TABS tablet Take 1 tablet (5 mg total) by mouth 2 (two) times daily. 08/22/23   Everhart, Kirstie, DO  Cholecalciferol  (VITAMIN D3 SUPER STRENGTH) 50 MCG (2000 UT) TABS Take 1 tablet (2,000 Units total) by mouth every  morning. 08/22/23   Everhart, Kirstie, DO  ferrous sulfate  325 (65 FE) MG tablet Take 1 tablet (325 mg total) by mouth daily with breakfast. 08/22/23   Everhart, Kirstie, DO  folic acid  (FOLVITE ) 400 MCG tablet Take 1 tablet (400 mcg total) by mouth every morning. 08/22/23   Everhart, Kirstie, DO  mesalamine  (APRISO ) 0.375 g 24 hr capsule Take 4 capsules (1.5 g total) by mouth daily. Please keep your appointment in July for any further refills. Thank you Patient taking differently: Take 1.5 g by mouth daily. 07/21/23   Armbruster, Lendon Queen, MD  pantoprazole  (PROTONIX ) 40 MG tablet Take 1 tablet (40 mg total) by mouth 2 (two) times daily. 08/22/23   Everhart, Kirstie, DO      Allergies    Patient has no known allergies.    Review of Systems   Review of Systems  Physical Exam Updated Vital Signs BP (!) 144/80 (BP Location: Right Arm)   Pulse 83   Temp 97.7 F (36.5 C) (Oral)   Resp 17   Ht 5\' 3"  (1.6 m)   Wt 59 kg   SpO2 (!) 86%   BMI 23.03 kg/m  Physical Exam Vitals and nursing note reviewed. Exam conducted with a chaperone present.  Constitutional:      General: She is not in acute distress.    Appearance: Normal appearance. She is not ill-appearing.  HENT:  Head: Normocephalic.     Mouth/Throat:     Mouth: Mucous membranes are dry.  Eyes:     Extraocular Movements: Extraocular movements intact.  Cardiovascular:     Rate and Rhythm: Normal rate and regular rhythm.     Pulses: Normal pulses.     Heart sounds: Normal heart sounds. No murmur heard. Pulmonary:     Effort: Pulmonary effort is normal. No respiratory distress.     Breath sounds: Normal breath sounds.  Abdominal:     General: There is distension.     Tenderness: There is abdominal tenderness. There is no guarding or rebound.  Genitourinary:    Rectum: Normal.     Comments: External hemorrhoid present No frank blood Musculoskeletal:     Right lower leg: Edema (1+ to mid shin) present.     Left lower leg:  Edema (1+ to mid shin) present.  Skin:    General: Skin is warm and dry.  Neurological:     General: No focal deficit present.     Mental Status: She is alert.  Psychiatric:        Mood and Affect: Mood normal.     ED Results / Procedures / Treatments   Labs (all labs ordered are listed, but only abnormal results are displayed) Labs Reviewed  COMPREHENSIVE METABOLIC PANEL WITH GFR - Abnormal; Notable for the following components:      Result Value   Potassium 3.0 (*)    Glucose, Bld 104 (*)    Calcium  7.7 (*)    Albumin  2.1 (*)    All other components within normal limits  CBC WITH DIFFERENTIAL/PLATELET - Abnormal; Notable for the following components:   RBC 3.75 (*)    Hemoglobin 10.2 (*)    HCT 32.1 (*)    RDW 19.3 (*)    All other components within normal limits  MAGNESIUM  - Abnormal; Notable for the following components:   Magnesium  1.6 (*)    All other components within normal limits  C DIFFICILE QUICK SCREEN W PCR REFLEX    GASTROINTESTINAL PANEL BY PCR, STOOL (REPLACES STOOL CULTURE)  RESP PANEL BY RT-PCR (RSV, FLU A&B, COVID)  [id number]  PHOSPHORUS  URINALYSIS, ROUTINE W REFLEX MICROSCOPIC  COMPREHENSIVE METABOLIC PANEL WITH GFR  MAGNESIUM   PROTIME-INR  APTT  HEPARIN  LEVEL (UNFRACTIONATED)    EKG EKG Interpretation Date/Time:  Saturday Aug 31 2023 15:32:14 EDT Ventricular Rate:  71 PR Interval:  67 QRS Duration:  84 QT Interval:  451 QTC Calculation: 491 R Axis:   -29  Text Interpretation: Sinus rhythm Short PR interval LAE, consider biatrial enlargement Borderline left axis deviation Posterior infarct, old Anterior infarct, old Borderline repol abnrm, inferolateral leads ST elevation, consider inferior injury Confirmed by Afton Horse [phone]) on 08/31/2023 3:44:27 PM  Radiology CT ABDOMEN PELVIS W CONTRAST Result Date: 08/31/2023 CLINICAL DATA:  Acute abdominal pain with diarrhea. EXAM: CT ABDOMEN AND PELVIS WITH CONTRAST TECHNIQUE: Multidetector CT  imaging of the abdomen and pelvis was performed using the standard protocol following bolus administration of intravenous contrast. RADIATION DOSE REDUCTION: This exam was performed according to the departmental dose-optimization program which includes automated exposure control, adjustment of the mA and/or kV according to patient size and/or use of iterative reconstruction technique. CONTRAST:  75mL OMNIPAQUE  IOHEXOL  350 MG/ML SOLN COMPARISON:  CT abdomen and pelvis 07/31/2023. MRI of abdomen 07/31/2023. FINDINGS: Lower chest: No acute abnormality. Hepatobiliary: Again seen is diffuse fatty infiltration of the liver. Gallbladder is surgically absent. No biliary ductal dilatation  identified. Previously seen choledocholithiasis no longer identified. Pancreas: Unremarkable. No pancreatic ductal dilatation or surrounding inflammatory changes. Spleen: Enlarged, unchanged. Adrenals/Urinary Tract: Adrenal glands are within normal limits. No hydronephrosis or perinephric fluid. There are small left peripelvic cysts. There is a right renal cyst measuring 5.2 cm, unchanged. Bladder is within normal limits. Stomach/Bowel: There is diffuse colonic wall thickening is well some edema of small bowel loops in the right lower quadrant. No dilated bowel loops, pneumatosis or free air. Colonic diverticulosis is present. The appendix is not visualized. Small to moderate-sized hiatal hernia is again seen. There is an air-fluid level in the distal esophagus. The stomach is completely decompressed which limits evaluation. Vascular/Lymphatic: Aorta and IVC are normal in size. There are atherosclerotic calcifications of the aorta. No enlarged lymph nodes are seen. Rectal/perirectal varices or hemorrhoids noted. Reproductive: Uterus and bilateral adnexa are unremarkable. Other: There is large volume ascites, unchanged. Musculoskeletal: Degenerative changes affect the spine. IMPRESSION: 1. Diffuse colonic wall thickening and edema of small  bowel loops in the right lower quadrant worrisome for infectious/inflammatory enterocolitis. 2. Stable large volume ascites. 3. Stable fatty infiltration of the liver. 4. Stable splenomegaly. 5. Stable rectal/perirectal varices or hemorrhoids. 6. Stable hiatal hernia. 7. Aortic atherosclerosis. Aortic Atherosclerosis (ICD10-I70.0). Electronically Signed   By: Tyron Gallon M.D.   On: 08/31/2023 17:33    Procedures Procedures    Medications Ordered in ED Medications  heparin  ADULT infusion 100 units/mL (25000 units/250mL) (has no administration in time range)  0.9 %  sodium chloride  infusion (has no administration in time range)  pantoprazole  (PROTONIX ) EC tablet 40 mg (has no administration in time range)  acetaminophen  (TYLENOL ) tablet 650 mg (has no administration in time range)  mesalamine  (LIALDA ) EC tablet 2.4 g (has no administration in time range)  sodium chloride  0.9 % bolus 500 mL (0 mLs Intravenous Stopped 08/31/23 1814)  potassium chloride  SA (KLOR-CON  M) CR tablet 40 mEq (40 mEq Oral Given 08/31/23 1705)  magnesium  sulfate IVPB 2 g 50 mL (0 g Intravenous Stopped 08/31/23 1815)  iohexol  (OMNIPAQUE ) 350 MG/ML injection 75 mL (75 mLs Intravenous Contrast Given 08/31/23 1707)    ED Course/ Medical Decision Making/ A&P Clinical Course as of 08/31/23 2229  Sat Aug 31, 2023  1634 CBC with Differential(!) Hemoglobin stable, no evidence of acute bleed GI bleed.  [MQ]  1643 Comprehensive metabolic panel(!) Potassium low consistent with GI losses.  [MQ]  1644 Magnesium (!): 1.6 Magnesium  and Potassium repletion ordered. [MQ]    Clinical Course User Index [MQ] Ivin Marrow, MD                                 Medical Decision Making Patient diarrhea description is concerning for possible melena secondary to upper GI bleed.  This is definitely concerning given patient's on Eliquis  and history of upper GI bleed.  Her last hospitalization note she has an unclear cause of ascites with  possible portal hypertension.  Her EGD on 3/10 did not show varices.  She does have a history of Crohn's disease and this is likely contributing to this picture.  She did receive antibiotics at her previous hospitalization she is at risk for C. difficile. May equally have component of gastroenteritis. I doubt that she is having an acute upper GI bleed.  She is reassuringly hemodynamically stable.  Will get CBC, BMP, magnesium , phosphate, CT abdomen pelvis, UA.  I have low suspicion for further acute  abdominal pathology including appendicitis, acute cholangitis, SBO, overflow incontinence.   Patient has also had significant weigh loss since last admission.  At minimum given failure to thrive and functional compliance, this patient will likely require admission for placement at SNF and/or safe dispo plan is likely she is no longer able to live at home alone.  Will await labs and CT abdomen.  CT abdomen pelvis is consistent with likely viral enteritis, stable ascites present. Given weight loss, plan for admission for failure to thrive and placement. Discussed with Dr. Eveleen Hinds who will admit the patient.  Amount and/or Complexity of Data Reviewed Labs: ordered. Decision-making details documented in ED Course. Radiology: ordered. ECG/medicine tests: ordered.  Risk Prescription drug management. Decision regarding hospitalization.           Final Clinical Impression(s) / ED Diagnoses Final diagnoses:  Diarrhea, unspecified type  Failure to thrive in adult    Rx / DC Orders ED Discharge Orders     None         Ivin Marrow, MD 08/31/23 2229    Afton Horse T, DO 09/07/23 1452

## 2023-08-31 NOTE — Assessment & Plan Note (Signed)
 Present for over a week, difficulty swallowing food, pills and liquids, contributing to weight loss.  Does have a history of esophagitis and dysphagia. -Bedside swallow - Speech eval - Plan to consult GI in the morning - Gentle IV fluids - N.p.o. at midnight in case of EGD tomorrow

## 2023-08-31 NOTE — ED Notes (Signed)
 Pt to CT

## 2023-08-31 NOTE — Assessment & Plan Note (Addendum)
 Concerning for Crohn's flare as cause of inflammatory enterocolitis.  Reassuringly, no large amounts of BRBPR noted and hemoglobin is stable.  Hemorrhoids noted on imaging, could be because of blood on toilet paper with wiping.  Diarrhea and decreased appetite likely cause of electrolyte disturbances.  Due to other symptoms of mild congestion and rhinorrhea, will also rule out respiratory virus which could cause diarrhea as well.  -Admit to [facility], attending Dr. McDiarmid -Plan for GI consult in the morning -Follow-up C. difficile -Follow-up GIPP -Follow-up RSV/flu/COVID -Continue mesalamine  for Crohn's -Can consider IV methylprednisolone once GI infection ruled out -AM CBC and CMP -AM mag -Repeat electrolytes as needed -gentle IV fluids - N.p.o. after midnight in case of scope

## 2023-08-31 NOTE — Progress Notes (Signed)
 PHARMACY - ANTICOAGULATION CONSULT NOTE  Pharmacy Consult for heparin  Indication: pulmonary embolus  No Known Allergies  Patient Measurements: Height: 5\' 3"  (160 cm) Weight: 59 kg (130 lb) IBW/kg (Calculated) : 52.4 HEPARIN  DW (KG): 59  Vital Signs: Temp: 97.7 F (36.5 C) (05/31 1957) Temp Source: Oral (05/31 1957) BP: 144/80 (05/31 1957) Pulse Rate: 83 (05/31 1957)  Labs: Recent Labs    08/31/23 1605  HGB 10.2*  HCT 32.1*  PLT 179  CREATININE 0.88    Estimated Creatinine Clearance: 42.9 mL/min (by C-G formula based on SCr of 0.88 mg/dL).   Medical History: Past Medical History:  Diagnosis Date   Abdominal pain 12/03/2022   Adrenal nodule (HCC)    Anemia    Blood transfusion without reported diagnosis    Cataract    bil removed   Choledocholithiasis with obstruction    Colitis    Crohn's colitis (HCC) 07/11/2012   Crohn's disease (HCC) 06/10/2013   Diastolic dysfunction    a. 05/2013 Echo: EF 60-65%, no rwma, GrI DD, mild-mod TR.   Diverticulosis    Elevated blood pressure reading 02/21/2021   ESOPHAGEAL STRICTURE 11/27/2007   Qualifier: History of   By: Nelson-Smith CMA (AAMA), [name]         Fatty liver    GERD with esophagitis 11/27/2007   EGD showed LA Grade D reflux esophagitis 06/2023        Hematemesis 06/09/2023   Hiatal hernia    History of esophageal stricture    History of left heart catheterization    a. 05/2013 Cath: Nl cors. Nl R heart filling pressures.  No L -> R shunt.  EF 65%.   Hx of gastritis    Hyperlipidemia 01/24/2018   Lower extremity edema 12/03/2022   Palpitations    a. 09/2014 Zio: Rare PACs/PVCs.   Pyelonephritis 05/29/2022   Right shoulder pain 12/03/2022   Sepsis (HCC) 12/03/2022   UTI (urinary tract infection) 05/14/2013      Assessment: Diane Freeman with hx recent PE on apixaban  PTA admitted for ongoing diarrhea. CT concerning for enterocolitis. Pharmacy to start heparin  drip while apixaban  on hold for possible  procedures.   Goal of Therapy:  Heparin  level 0.3-0.7 units/ml aPTT 66-102 seconds Monitor platelets by anticoagulation protocol: Yes   Plan:  Heparin  850 units/h no bolus Check aPTT and heparin  level in 8h  Levin Reamer, PharmD, [place], [facility] Clinical Pharmacist [phone] Please check AMION for all [facility] Pharmacy numbers 08/31/2023

## 2023-08-31 NOTE — H&P (Cosign Needed)
 Hospital Admission History and Physical Service Pager: [phone]  Patient name: Diane Freeman Medical record number: [id number] Date of Birth: 1945/05/16 Age: 78 y.o. Gender: female  Primary Care Provider: Sarahann Cumins, DO Consultants: None Code Status: Full code  Preferred Emergency Contact:  Contact Information     Name Relation Home Work Mobile   Diane Freeman Sister [phone]  [phone]   Diane Freeman Niece   [phone]      Other Contacts     Name Relation Home Work Mobile   [place] Son   [phone]        Chief Complaint: diarrhea  Assessment and Plan: Diane Freeman is a 78 y.o. female presenting from home via EMS with diarrhea, vomiting. Differential for presentation of this includes:  - Crohn's flare with bloody diarrhea for past week could be because of enterocolitis - infectious etiology of enterocolitis less likely without leukocytosis, fever. C diff in process  - failure to thrive given recent weight loss of 7 lbs, poor appetite  - Less concern for bowel ischemia or gastric ulcer given no severe abdominal pain - Unclear cause of now chronic ascites (liver does not appear cirrhotic on previous imaging, no evidence of intra-abdominal malignancy) Assessment & Plan Diarrhea Concerning for Crohn's flare as cause of inflammatory enterocolitis.  Reassuringly, no large amounts of BRBPR noted and hemoglobin is stable.  Hemorrhoids noted on imaging, could be because of blood on toilet paper with wiping.  Diarrhea and decreased appetite likely cause of electrolyte disturbances.  Due to other symptoms of mild congestion and rhinorrhea, will also rule out respiratory virus which could cause diarrhea as well.  -Admit to FMTS, attending Dr. McDiarmid -Plan for GI consult in the morning -Follow-up C. difficile -Follow-up GIPP -Follow-up RSV/flu/COVID -Continue mesalamine  for Crohn's -Can consider IV methylprednisolone once GI infection ruled  out -AM CBC and CMP -AM mag -Repeat electrolytes as needed -gentle IV fluids - N.p.o. after midnight in case of scope  Weight loss ~ 13 kg weight loss in the last month.  In the setting of diarrhea, dysphagia, poor p.o. intake.  Current diarrhea associated with nausea and decreased appetite also contributing to weight loss.  There is some concern for failure to thrive - PT, OT eval and treat - Referral for RD - Monitor electrolytes and replete as needed - strict I/Os - Daily weights Dysphagia Present for over a week, difficulty swallowing food, pills and liquids, contributing to weight loss.  Does have a history of esophagitis and dysphagia. -Bedside swallow - Speech eval - Plan to consult GI in the morning - Gentle IV fluids - N.p.o. at midnight in case of EGD tomorrow Ascites Large volume ascites noted on CT A&P.  It was also noted during previous hospitalization requiring paracentesis with 2.7 L removed.  CT did show chronic and stable splenomegaly and perirectal varices which is concerning for portal HTN.  For ascites, she previously took Lasix  and spironolactone , unclear why she stopped -Diagnostic paracentesis ordered -Plan for GI consult to evaluate etiology of ascites Chronic health problem  Paroxysmal atrial fibrillation: Heart rate controlled on admission. Does not take rate control agents. Holding home eliquis  in setting of heparin  drip for PE. GERD: Continue home Protonix   FEN/GI: Regular diet  VTE Prophylaxis: Heparin  per pharmacy   Disposition: MedSurg  History of Present Illness:  Diane Freeman is a 78 y.o. female presenting from home via EMS with diarrhea. Patient is not sure why she is here; she shares that her  sister called 911, but she doesn't know why. She says she has been doing very well living on her own for the last several weeks.  When asked about diarrhea she does agree that she has had several episodes of diarrhea over the last couple of days.   Some blood on the tissue when she wipes but none in the toilet. She also reports poor appetite with some nausea and vomiting in the last couple of days. No blood in vomitus. She admits to mild headache which started today.  She also complains of being very thirsty. No chest pain.  Admits to mild shortness of breath with exertion. She reports being compliant with Eliquis  aside from missing one dose this morning d/t issues with swallowing. Sister is at bedside and reports dark tarry stools for the past week and notes that patient has had a difficult time mobilizing due to generalized weakness.  She reports patient has had a difficult time swallowing since leaving SNF, sometimes unable to swallow her pills and spits food back out.  Unclear if this is strictly because of difficulty swallowing or also nausea.   She also notes patient has has had some rhinorrhea and congestion since being discharged from SNF.  No fevers, body aches.    Of note she was hospitalized earlier this month in setting of acute PE, ascites and concern for CBD dilation. Discharged from SNF on Aug 20 2023 and currently lives alone, seems to be having a difficult time caring for herself per PCP notes. There was concern about CBD dilation d/t stone (thought to be incidental finding) with plans for ERCP outpatient.  Current CT A&P does not show this.  In the ED, vitals stable, labs-CBC with differential showing no leukocytosis, stable hemoglobin at 10.2, CMP with mild hypocalcemia, hypoalbuminemia, hypokalemia, low mag. CT abdomen pelvis showing diffuse colonic wall thickening and edema of small bowel loops (concerning for enterocolitis), large volume ascites, fatty liver, stable splenomegaly, stable rectal varices or hemorrhoids, hiatal hernia. She was treated with potassium, magnesium , 500 mL fluid bolus    Review Of Systems: Per HPI.  Pertinent Past Medical History: Afib Recent PE CHF Hx brain bleed HTN Crohns  disease GERD Dementia  Recent UGIB, LA Grade D reflux esophagitis (Mar 2025) Recent septic shock iso Ecoli bacteremia and Staph Aureus UTI (4/16-4/23/25) Ascites of unknown origin Remainder reviewed in history tab.   Pertinent Past Surgical History: Cholecystectomy 2009 ERCP 2013 L/R Heart Cath 2015 EGD with clip placement 06/10/2023  Remainder reviewed in history tab.   Pertinent Social History: Tobacco use: Never Alcohol use: None Other Substance use: None Lives at home alone  Pertinent Family History: Sister: Breast cancer Sister: Stomach cancer Maternal grandmother: Bone cancer Remainder reviewed in history tab.   Important Outpatient Medications: Eliquis  5 BID Protonix  40 BID Mesalamine  1.5 daily  Remainder reviewed in medication history.   Objective: BP 117/65   Pulse (!) 58   Temp 97.7 F (36.5 C) (Oral)   Resp (!) 22   Ht 5\' 3"  (1.6 m)   Wt 59 kg   SpO2 95%   BMI 23.03 kg/m  Exam: General: chronically ill-appearing, no acute distress. HEENT: normocephalic, EOM grossly intact, mucous membranes dry. Cardio: RRR, normal S1/S2 Pulm: No increased work of breathing. CTAB Abdominal: abdomen is distended and taut, non-tender.  Bowel sounds present Extremities: ~2+ pitting edema in bilateral Les, R>L Neuro: Alert and oriented x3, speech normal in content, no facial asymmetry. Psych:  Cognition and judgment appear intact. Alert,  communicative, and cooperative with normal attention span and concentration.  Labs:  CBC BMET  Recent Labs  Lab 08/31/23 1605  WBC 5.5  HGB 10.2*  HCT 32.1*  PLT 179   Recent Labs  Lab 08/31/23 1605  NA 137  K 3.0*  CL 106  CO2 23  BUN 10  CREATININE 0.88  GLUCOSE 104*  CALCIUM  7.7*      EKG: My own interpretation (not copied from electronic read)    NSR, very mildly inverted T waves in 2 3 and aVF - consistent with prior EKGs    Glenn Lange, DO 08/31/2023, 7:20 PM PGY-3, [facility]  FPTS  Intern pager: [phone], text pages welcome Secure chat group [facility] Teaching Service

## 2023-08-31 NOTE — Assessment & Plan Note (Addendum)
 Large volume ascites noted on CT A&P.  It was also noted during previous hospitalization requiring paracentesis with 2.7 L removed.  CT did show chronic and stable splenomegaly and perirectal varices which is concerning for portal HTN.  For ascites, she previously took Lasix  and spironolactone , unclear why she stopped -Diagnostic paracentesis ordered -Plan for GI consult to evaluate etiology of ascites

## 2023-08-31 NOTE — Assessment & Plan Note (Addendum)
~   13 kg weight loss in the last month.  In the setting of diarrhea, dysphagia, poor p.o. intake.  Current diarrhea associated with nausea and decreased appetite also contributing to weight loss.  There is some concern for failure to thrive - PT, OT eval and treat - Referral for RD - Monitor electrolytes and replete as needed - strict I/Os - Daily weights

## 2023-08-31 NOTE — ED Triage Notes (Signed)
 Pt to ED from home c/o diarrhea and urinary frequency x 2 weeks. Decreased PO intake.  A&O X4   No medications given by EMS.   Last VS: 128/78, P 78., 97%RA, cbg 188,  temp 98.4

## 2023-08-31 NOTE — Assessment & Plan Note (Addendum)
  Paroxysmal atrial fibrillation: Heart rate controlled on admission. Does not take rate control agents. Holding home eliquis  in setting of heparin  drip for PE. GERD: Continue home Protonix

## 2023-08-31 NOTE — Hospital Course (Addendum)
 Diane Freeman is a 78 y.o. female presenting from home with FTT, decreased PO intake and diarrhea. PMHx includes Crohn's disease, large hiatal hernia, history of PE/DVT, A-fib.  Her hospital course is outlined below:   Swallowing Difficulty  Diarrhea Weight loss 15 lb in one month. Previously discharged to SNF on 08/07/23 Now returning for decreased PO intake and overall weight loss, attributed to swallowing difficulties and diarrhea. CTAP with suspected enterocolitis inflammatory versus infectious.  C diff and GIPP were negative, and her diarrhea resolved prior to discharge. She received appropriate supportive care through out her admission. PT and OT recommended SNF for short term rehab, however patient did not desire to go to SNF. HHPT and PCS were arranged for patient prior to discharge.  Ascites:  No prior history of liver pathology. Underwent diagnostic/therapeutic paracentesis with removal of 3.2 L of yellow fluid. Cytology was inflammatory in nature. Recommend outpatient GI follow up.    PCP Follow Up:  Outpatient GI follow up [facility]BCI referral for LTC vs ALF vs SNF

## 2023-08-31 NOTE — ED Notes (Signed)
 Report given to 1O10.

## 2023-09-01 ENCOUNTER — Inpatient Hospital Stay (HOSPITAL_COMMUNITY)

## 2023-09-01 ENCOUNTER — Encounter (HOSPITAL_COMMUNITY): Payer: Self-pay | Admitting: Student

## 2023-09-01 ENCOUNTER — Other Ambulatory Visit: Payer: Self-pay

## 2023-09-01 DIAGNOSIS — E860 Dehydration: Secondary | ICD-10-CM

## 2023-09-01 DIAGNOSIS — E8809 Other disorders of plasma-protein metabolism, not elsewhere classified: Secondary | ICD-10-CM | POA: Diagnosis present

## 2023-09-01 DIAGNOSIS — R197 Diarrhea, unspecified: Secondary | ICD-10-CM

## 2023-09-01 DIAGNOSIS — R161 Splenomegaly, not elsewhere classified: Secondary | ICD-10-CM | POA: Diagnosis present

## 2023-09-01 DIAGNOSIS — E440 Moderate protein-calorie malnutrition: Secondary | ICD-10-CM

## 2023-09-01 DIAGNOSIS — K7581 Nonalcoholic steatohepatitis (NASH): Secondary | ICD-10-CM | POA: Diagnosis present

## 2023-09-01 DIAGNOSIS — R8281 Pyuria: Secondary | ICD-10-CM | POA: Diagnosis present

## 2023-09-01 DIAGNOSIS — K639 Disease of intestine, unspecified: Secondary | ICD-10-CM | POA: Diagnosis not present

## 2023-09-01 DIAGNOSIS — I82411 Acute embolism and thrombosis of right femoral vein: Secondary | ICD-10-CM

## 2023-09-01 DIAGNOSIS — I2694 Multiple subsegmental pulmonary emboli without acute cor pulmonale: Secondary | ICD-10-CM

## 2023-09-01 DIAGNOSIS — F0390 Unspecified dementia without behavioral disturbance: Secondary | ICD-10-CM | POA: Diagnosis not present

## 2023-09-01 DIAGNOSIS — K6389 Other specified diseases of intestine: Secondary | ICD-10-CM | POA: Insufficient documentation

## 2023-09-01 DIAGNOSIS — R634 Abnormal weight loss: Secondary | ICD-10-CM

## 2023-09-01 DIAGNOSIS — L89302 Pressure ulcer of unspecified buttock, stage 2: Secondary | ICD-10-CM | POA: Diagnosis present

## 2023-09-01 DIAGNOSIS — Z8719 Personal history of other diseases of the digestive system: Secondary | ICD-10-CM | POA: Insufficient documentation

## 2023-09-01 LAB — PROTIME-INR
INR: 1.4 — ABNORMAL HIGH (ref 0.8–1.2)
Prothrombin Time: 17.4 s — ABNORMAL HIGH (ref 11.4–15.2)

## 2023-09-01 LAB — URINALYSIS, ROUTINE W REFLEX MICROSCOPIC
Bilirubin Urine: NEGATIVE
Glucose, UA: NEGATIVE mg/dL
Ketones, ur: NEGATIVE mg/dL
Nitrite: NEGATIVE
Protein, ur: 30 mg/dL — AB
Specific Gravity, Urine: >1.046 — ABNORMAL HIGH (ref 1.005–1.030)
pH: 5 (ref 5.0–8.0)

## 2023-09-01 LAB — CBC
HCT: 31.9 % — ABNORMAL LOW (ref 36.0–46.0)
Hemoglobin: 10.2 g/dL — ABNORMAL LOW (ref 12.0–15.0)
MCH: 26.9 pg (ref 26.0–34.0)
MCHC: 32 g/dL (ref 30.0–36.0)
MCV: 84.2 fL (ref 80.0–100.0)
Platelets: 168 10*3/uL (ref 150–400)
RBC: 3.79 MIL/uL — ABNORMAL LOW (ref 3.87–5.11)
RDW: 19.6 % — ABNORMAL HIGH (ref 11.5–15.5)
WBC: 3.8 10*3/uL — ABNORMAL LOW (ref 4.0–10.5)
nRBC: 0 % (ref 0.0–0.2)

## 2023-09-01 LAB — COMPREHENSIVE METABOLIC PANEL WITH GFR
ALT: 15 U/L (ref 0–44)
AST: 23 U/L (ref 15–41)
Albumin: 2 g/dL — ABNORMAL LOW (ref 3.5–5.0)
Alkaline Phosphatase: 95 U/L (ref 38–126)
Anion gap: 7 (ref 5–15)
BUN: 8 mg/dL (ref 8–23)
CO2: 23 mmol/L (ref 22–32)
Calcium: 7.7 mg/dL — ABNORMAL LOW (ref 8.9–10.3)
Chloride: 108 mmol/L (ref 98–111)
Creatinine, Ser: 0.89 mg/dL (ref 0.44–1.00)
GFR, Estimated: >60 mL/min (ref >60)
Glucose, Bld: 85 mg/dL (ref 70–99)
Potassium: 3.4 mmol/L — ABNORMAL LOW (ref 3.5–5.1)
Sodium: 138 mmol/L (ref 135–145)
Total Bilirubin: 1.1 mg/dL (ref 0.0–1.2)
Total Protein: 6.5 g/dL (ref 6.5–8.1)

## 2023-09-01 LAB — BODY FLUID CELL COUNT WITH DIFFERENTIAL
Eos, Fluid: 0 %
Lymphs, Fluid: 39 %
Monocyte-Macrophage-Serous Fluid: 12 % — ABNORMAL LOW (ref 50–90)
Neutrophil Count, Fluid: 49 % — ABNORMAL HIGH (ref 0–25)
Total Nucleated Cell Count, Fluid: 141 uL (ref 0–1000)

## 2023-09-01 LAB — C-REACTIVE PROTEIN: CRP: 2.3 mg/dL — ABNORMAL HIGH (ref <1.0)

## 2023-09-01 LAB — C DIFFICILE QUICK SCREEN W PCR REFLEX
C Diff antigen: NEGATIVE
C Diff interpretation: NOT DETECTED
C Diff toxin: NEGATIVE

## 2023-09-01 LAB — SEDIMENTATION RATE: Sed Rate: 27 mm/h — ABNORMAL HIGH (ref 0–22)

## 2023-09-01 LAB — PROTEIN, PLEURAL OR PERITONEAL FLUID: Total protein, fluid: <3 g/dL

## 2023-09-01 LAB — GRAM STAIN

## 2023-09-01 LAB — HEPARIN LEVEL (UNFRACTIONATED): Heparin Unfractionated: 0.29 [IU]/mL — ABNORMAL LOW (ref 0.30–0.70)

## 2023-09-01 LAB — ALBUMIN, PLEURAL OR PERITONEAL FLUID: Albumin, Fluid: <1.5 g/dL

## 2023-09-01 LAB — RESP PANEL BY RT-PCR (RSV, FLU A&B, COVID)  RVPGX2
Influenza A by PCR: NEGATIVE
Influenza B by PCR: NEGATIVE
Resp Syncytial Virus by PCR: NEGATIVE
SARS Coronavirus 2 by RT PCR: NEGATIVE

## 2023-09-01 LAB — APTT: aPTT: 38 s — ABNORMAL HIGH (ref 24–36)

## 2023-09-01 LAB — GLUCOSE, PLEURAL OR PERITONEAL FLUID: Glucose, Fluid: 88 mg/dL

## 2023-09-01 LAB — LACTATE DEHYDROGENASE, PLEURAL OR PERITONEAL FLUID: LD, Fluid: 30 U/L — ABNORMAL HIGH (ref 3–23)

## 2023-09-01 LAB — MAGNESIUM: Magnesium: 2.1 mg/dL (ref 1.7–2.4)

## 2023-09-01 MED ORDER — KCL IN DEXTROSE-NACL 20-5-0.45 MEQ/L-%-% IV SOLN
INTRAVENOUS | Status: AC
  Filled 2023-09-01 (×3): qty 1000

## 2023-09-01 MED ORDER — LIDOCAINE HCL (PF) 1 % IJ SOLN
8.0000 mL | Freq: Once | INTRAMUSCULAR | Status: AC
Start: 2023-09-01 — End: 2023-09-01
  Administered 2023-09-01: 8 mL via INTRADERMAL

## 2023-09-01 MED ORDER — POTASSIUM CHLORIDE 20 MEQ PO PACK
20.0000 meq | PACK | Freq: Once | ORAL | Status: AC
  Administered 2023-09-01: 20 meq via ORAL
  Filled 2023-09-01: qty 1

## 2023-09-01 MED ORDER — POTASSIUM CHLORIDE CRYS ER 20 MEQ PO TBCR: 40.0000 meq | EXTENDED_RELEASE_TABLET | Freq: Once | ORAL | Status: DC

## 2023-09-01 MED ORDER — LIDOCAINE HCL (PF) 1 % IJ SOLN
INTRAMUSCULAR | Status: AC
  Filled 2023-09-01: qty 30

## 2023-09-01 NOTE — Plan of Care (Signed)
  Problem: Activity: Goal: Risk for activity intolerance will decrease Outcome: Progressing   Problem: Nutrition: Goal: Adequate nutrition will be maintained Outcome: Progressing   Problem: Elimination: Goal: Will not experience complications related to bowel motility Outcome: Progressing Goal: Will not experience complications related to urinary retention Outcome: Progressing   Problem: Safety: Goal: Ability to remain free from injury will improve Outcome: Progressing

## 2023-09-01 NOTE — Progress Notes (Signed)
 Dr. Edison Gore inquired about morning labs being drawn. Contacted [facility] twice at [phone] and no one answered. Labs still pending.

## 2023-09-01 NOTE — Assessment & Plan Note (Addendum)
 Asymptomatic, UA with leuks and few bacteria, WBCs but also squames. -Repeat UA

## 2023-09-01 NOTE — Assessment & Plan Note (Addendum)
 Present for over a week, difficulty swallowing food, pills and liquids, contributing to weight loss.  Does have a history of esophagitis and dysphagia. -Bedside swallow - Speech eval - Consider GI consult as noted - IV fluids

## 2023-09-01 NOTE — Evaluation (Signed)
 Occupational Therapy Evaluation Patient Details Name: Diane Freeman MRN: [id number] DOB: 1945-10-08 Today's Date: 09/01/2023   History of Present Illness   Pt is a 78 y.o. female who presented 08/31/23 with bloody diarrhea, ascites, and vomiting. S/p ultrasound guided paracentesis from the right lower quadrant 6/1. CT with evidence of infectious vs inflammatory enterocolitis. ?Crohn's flare. PMH includes Crohns, hiatal hernia, esophageal stenosis, gastritis, Afib, GERD, anemia, dementia, HLD     Clinical Impressions Pt admitted based on above, and was seen based on problem list below. PTA pt was recently d/c from snf 11 days PTA. Her sister was staying with her temporarily, pt usually lives alone. Today pt is requiring set up  to CGA for  ADLs. Bed mobility and functional transfers are  CGA. Pt with hx of cog deficits, Short Blessed Test performed showing deficits in STM, problem solving, and sequencing. Pt mobilizing well but is mildly impulsive and shows poor safety and judgement. Pt would greatly benefit from 24/7 supervision upon d/c. Recommendation of <3 hours of skilled rehab daily. OT will continue to follow acutely to maximize functional independence.        If plan is discharge home, recommend the following:   A little help with walking and/or transfers;A little help with bathing/dressing/bathroom     Functional Status Assessment   Patient has had a recent decline in their functional status and demonstrates the ability to make significant improvements in function in a reasonable and predictable amount of time.     Equipment Recommendations   Other (comment) (Defer to next venue)     Recommendations for Other Services         Precautions/Restrictions   Precautions Precautions: Fall;Other (comment) Recall of Precautions/Restrictions: Intact Precaution/Restrictions Comments: enteric precautions Restrictions Weight Bearing Restrictions Per Provider Order:  No     Mobility Bed Mobility Overal bed mobility: Needs Assistance Bed Mobility: Supine to Sit, Sit to Supine     Supine to sit: Supervision, HOB elevated, Used rails Sit to supine: Supervision, HOB elevated, Used rails   General bed mobility comments: minimal use of bed features    Transfers Overall transfer level: Needs assistance Equipment used: None Transfers: Sit to/from Stand, Bed to chair/wheelchair/BSC Sit to Stand: Contact guard assist     Step pivot transfers: Contact guard assist     General transfer comment: CGA during mobility, able to take lateral steps towards [facility] without UE support poor balance      Balance Overall balance assessment: Mild deficits observed, not formally tested     ADL either performed or assessed with clinical judgement   ADL Overall ADL's : Needs assistance/impaired Eating/Feeding: Set up;Sitting   Grooming: Wash/dry hands;Contact guard assist;Standing Grooming Details (indicate cue type and reason): no LOB at sink without UE support Upper Body Bathing: Set up;Sitting   Lower Body Bathing: Contact guard assist;Sit to/from stand   Upper Body Dressing : Set up;Sitting   Lower Body Dressing: Contact guard assist;Sit to/from stand Lower Body Dressing Details (indicate cue type and reason): Able to figure 4 for socks Toilet Transfer: Contact guard assist;Ambulation;Rolling walker (2 wheels) Toilet Transfer Details (indicate cue type and reason): CGA for balance cues for safety Toileting- Clothing Manipulation and Hygiene: Contact guard assist;Sit to/from stand Toileting - Clothing Manipulation Details (indicate cue type and reason): Simulated in room     Functional mobility during ADLs: Contact guard assist;Rolling walker (2 wheels);Cueing for sequencing;Cueing for safety General ADL Comments: Pt near functional baseline with ADLs however requires cues for  safety, and close CGA for balance     Vision Baseline Vision/History: 0 No  visual deficits Vision Assessment?: No apparent visual deficits            Pertinent Vitals/Pain Pain Assessment Pain Assessment: No/denies pain Faces Pain Scale: No hurt Pain Intervention(s): Monitored during session     Extremity/Trunk Assessment Upper Extremity Assessment Upper Extremity Assessment: Generalized weakness   Lower Extremity Assessment Lower Extremity Assessment: Defer to PT evaluation   Cervical / Trunk Assessment Cervical / Trunk Assessment: Kyphotic   Communication Communication Communication: No apparent difficulties   Cognition Arousal: Alert Behavior During Therapy: Impulsive (mildly) Cognition: History of cognitive impairments       OT - Cognition Comments: Pt slightly impulsive with STM deficits, decreased problem solving skills, decreased insight into deficits       Following commands: Impaired Following commands impaired: Follows multi-step commands with increased time     Cueing  General Comments   Cueing Techniques: Verbal cues  Son present for session and supportive. Pt not endorsing falls or need for assistance, son provided information           Home Living Family/patient expects to be discharged to:: Private residence Living Arrangements: Alone Available Help at Discharge: Family;Available PRN/intermittently Type of Home: Mobile home Home Access: Stairs to enter Entrance Stairs-Number of Steps: 3 Entrance Stairs-Rails: None Home Layout: One level     Bathroom Shower/Tub: Tub/shower unit;Walk-in shower   Bathroom Toilet: Standard     Home Equipment: Agricultural consultant (2 wheels)   Additional Comments: recently her sister has been temporarily staying with her to assist since d/c from SNF ~11 days but prior to snf stay pt lived alone      Prior Functioning/Environment Prior Level of Function : Independent/Modified Independent     Mobility Comments: Pt has been ambulating without UE support since d/c from SNF ~11 days  PTA due to no RW at the home yet, the RW just got delivered PTA; has fallen off commode and took PT, sister, and x1 hour to get pt up off the floor ADLs Comments: sister has recently been staying and assisting pt as needed since recent d/c from SNF ~11 days PTA    OT Problem List: Decreased strength;Decreased activity tolerance;Impaired balance (sitting and/or standing);Decreased cognition;Decreased safety awareness;Decreased knowledge of use of DME or AE   OT Treatment/Interventions: Self-care/ADL training;Therapeutic exercise;Energy conservation;DME and/or AE instruction;Therapeutic activities;Patient/family education;Balance training      OT Goals(Current goals can be found in the care plan section)   Acute Rehab OT Goals Patient Stated Goal: To go to rehab before ALF OT Goal Formulation: With patient/family Time For Goal Achievement: 09/15/23 Potential to Achieve Goals: Good   OT Frequency:  Min 2X/week       AM-PAC OT "6 Clicks" Daily Activity     Outcome Measure Help from another person eating meals?: None Help from another person taking care of personal grooming?: A Little Help from another person toileting, which includes using toliet, bedpan, or urinal?: A Little Help from another person bathing (including washing, rinsing, drying)?: A Little Help from another person to put on and taking off regular upper body clothing?: A Little Help from another person to put on and taking off regular lower body clothing?: A Little 6 Click Score: 19   End of Session Equipment Utilized During Treatment: Gait belt;Rolling walker (2 wheels) Nurse Communication: Mobility status  Activity Tolerance: Patient tolerated treatment well Patient left: in bed;with call bell/phone within reach;with bed  alarm set;with family/visitor present  OT Visit Diagnosis: Unsteadiness on feet (R26.81);Other abnormalities of gait and mobility (R26.89);Repeated falls (R29.6);History of falling (Z91.81);Muscle  weakness (generalized) (M62.81)                Time: 2536-6440 OT Time Calculation (min): 22 min Charges:  OT General Charges $OT Visit: 1 Visit OT Evaluation $OT Eval Low Complexity: 1 Low  Delmer Ferraris, OT  Acute Rehabilitation Services Office [phone] Secure chat preferred   Mickael Alamo 09/01/2023, 4:12 PM

## 2023-09-01 NOTE — Evaluation (Signed)
 Clinical/Bedside Swallow Evaluation Patient Details  Name: Diane TATRO MRN: [id number] Date of Birth: January 27, 1946  Today's Date: 09/01/2023 Time: SLP Start Time (ACUTE ONLY): 1335 SLP Stop Time (ACUTE ONLY): 1400 SLP Time Calculation (min) (ACUTE ONLY): 25 min  Past Medical History:  Past Medical History:  Diagnosis Date   Abdominal pain 12/03/2022   Adrenal nodule (HCC)    Anemia    Blood transfusion without reported diagnosis    Cataract    bil removed   Choledocholithiasis with obstruction    Colitis    Crohn's colitis (HCC) 07/11/2012   Crohn's disease (HCC) 06/10/2013   Dementia (HCC)  Acute encephalopathy, resolved. 07/21/2023   Diastolic dysfunction    a. 05/2013 Echo: EF 60-65%, no rwma, GrI DD, mild-mod TR.   Diverticulosis    Elevated blood pressure reading 02/21/2021   ESOPHAGEAL STRICTURE 11/27/2007   Qualifier: History of   By: Nelson-Smith CMA (AAMA), [name]         Esophagitis determined by endoscopy 06/11/2023   EGD showed LA Grade D reflux esophagitis 06/2023     Fatty liver    GERD with esophagitis 11/27/2007   EGD showed LA Grade D reflux esophagitis 06/2023        Hematemesis 06/09/2023   Hiatal hernia    History of esophageal stricture    History of left heart catheterization    a. 05/2013 Cath: Nl cors. Nl R heart filling pressures.  No L -> R shunt.  EF 65%.   Hx of gastritis    Hyperlipidemia 01/24/2018   Lower extremity edema 12/03/2022   Palpitations    a. 09/2014 Zio: Rare PACs/PVCs.   Pyelonephritis 05/29/2022   Right shoulder pain 12/03/2022   Sepsis (HCC) 12/03/2022   UTI (urinary tract infection) 05/14/2013   Past Surgical History:  Past Surgical History:  Procedure Laterality Date   CARDIAC CATHETERIZATION  05/2013   CATARACT EXTRACTION Bilateral    CHOLECYSTECTOMY  2009   COLONOSCOPY  last 09/03/2017, 2020   ERCP  06/02/2011   Procedure: ENDOSCOPIC RETROGRADE CHOLANGIOPANCREATOGRAPHY (ERCP);  Surgeon: Kenney Peacemaker, MD;   Location: [facility];  Service: Endoscopy;  Laterality: N/A;   ESOPHAGOGASTRODUODENOSCOPY N/A 06/10/2023   Procedure: EGD (ESOPHAGOGASTRODUODENOSCOPY);  Surgeon: Elois Hair, MD;  Location: [facility] ENDOSCOPY;  Service: Gastroenterology;  Laterality: N/A;   HEMOSTASIS CLIP PLACEMENT  06/10/2023   Procedure: CONTROL OF HEMORRHAGE, GI TRACT, ENDOSCOPIC, BY CLIPPING OR OVERSEWING;  Surgeon: Elois Hair, MD;  Location: [facility] ENDOSCOPY;  Service: Gastroenterology;;   IR PARACENTESIS  07/31/2023   LEFT AND RIGHT HEART CATHETERIZATION WITH CORONARY ANGIOGRAM N/A 06/15/2013   Procedure: LEFT AND RIGHT HEART CATHETERIZATION WITH CORONARY ANGIOGRAM- Shunt series;  Surgeon: Arlander Bellman, MD;  Location: [facility] CATH LAB;  Service: Cardiovascular;  Laterality: N/A;   TUBAL LIGATION     UPPER GASTROINTESTINAL ENDOSCOPY     HPI:  Patient is a 78 y.o. female with PMH: a-fib, CHF, recent PE, Crohn's disease, GERD, hiatal hernia, GERD, recent upper GI bleed; EGD showed reflux esophagitis, ascites of unknown origin. She was hospitalized early May of 2025 in setting of acute PE, ascites and concern for CBD dilation and discharged to SNF and then discharged home 08/20/23 with sister. She presented to the ED on 5/31 via EMS with diarrhea with dark tarry stools, poor appetite, some nausea and vomiting, mild SOB on exertion. Sister reported that patient has had difficulty swallowing pills since leaving SNF and spits them out. (spits food out too). CT  abdomen pelvis showing diffuse colonic wall thickening and edema of small bowel loops (concerning for enterocolitis), large volume ascites, fatty liver, stable splenomegaly, stable rectal varices or hemorrhoids, hiatal hernia. SLP ordered to evaluate swallow function at bedside.    Assessment / Plan / Recommendation  Clinical Impression  Patient is not currently presenting with clinical s/s of oropharyngeal dysphagia as per this bedside swallow evaluation. When SLP entered  room, patient was feeding herself lunch and per sister this is the most see has eaten since being at SNF almost two weeks ago. Per sister, patient "didnt want to do much" at SNF and since discharging home with sister, ,"she hasn't been doing well at all". Patient herself denies this and adamantly talks of returning home. In regards to PO's, patient and sister report she has had difficulty with pills getting stuck in her throat resulting in her spitting them out. (she spits food out at times as well) SLP assessed patient's swallow while she fed herself regular solids and drank thin liquids. She did not exhibit any overt s/s aspiration, mastication appeared timely considering being edentulous and not having dentures in place. SLP suspects that patient's dysphagia is esophageal in nature and not recomnending further skilled intervention or assessment at this time. SLP Visit Diagnosis: Dysphagia, unspecified (R13.10)    Aspiration Risk  Mild aspiration risk    Diet Recommendation Regular;Thin liquid    Liquid Administration via: Cup;Straw Medication Administration: Other (Comment) (as tolerated) Supervision: Patient able to self feed Compensations: Slow rate;Small sips/bites Postural Changes: Seated upright at 90 degrees;Remain upright for at least 30 minutes after po intake    Other  Recommendations Oral Care Recommendations: Oral care BID    Recommendations for follow up therapy are one component of a multi-disciplinary discharge planning process, led by the attending physician.  Recommendations may be updated based on patient status, additional functional criteria and insurance authorization.  Follow up Recommendations No SLP follow up      Assistance Recommended at Discharge    Functional Status Assessment Patient has not had a recent decline in their functional status  Frequency and Duration            Prognosis        Swallow Study   General Date of Onset: 08/31/23 HPI: Patient  is a 78 y.o. female with PMH: a-fib, CHF, recent PE, Crohn's disease, GERD, hiatal hernia, GERD, recent upper GI bleed; EGD showed reflux esophagitis, ascites of unknown origin. She was hospitalized early May of 2025 in setting of acute PE, ascites and concern for CBD dilation and discharged to SNF and then discharged home 08/20/23 with sister. She presented to the ED on 5/31 via EMS with diarrhea with dark tarry stools, poor appetite, some nausea and vomiting, mild SOB on exertion. Sister reported that patient has had difficulty swallowing pills since leaving SNF and spits them out. (spits food out too). CT abdomen pelvis showing diffuse colonic wall thickening and edema of small bowel loops (concerning for enterocolitis), large volume ascites, fatty liver, stable splenomegaly, stable rectal varices or hemorrhoids, hiatal hernia. SLP ordered to evaluate swallow function at bedside. Type of Study: Bedside Swallow Evaluation Previous Swallow Assessment: none found Diet Prior to this Study: Regular;Thin liquids (Level 0) Temperature Spikes Noted: No Respiratory Status: Room air History of Recent Intubation: No Behavior/Cognition: Alert;Cooperative;Pleasant mood Oral Cavity Assessment: Within Functional Limits Oral Care Completed by SLP: No Oral Cavity - Dentition: Edentulous;Missing dentition Self-Feeding Abilities: Able to feed self Patient Positioning: Upright in  bed Baseline Vocal Quality: Normal Volitional Swallow: Able to elicit    Oral/Motor/Sensory Function Overall Oral Motor/Sensory Function: Within functional limits   Ice Chips     Thin Liquid Thin Liquid: Within functional limits Presentation: Self Fed;Cup    Nectar Thick     Honey Thick     Puree Puree: Not tested   Solid     Solid: Within functional limits Presentation: Self Fed     Jacqualine Mater, MA, CCC-SLP Speech Therapy

## 2023-09-01 NOTE — Evaluation (Signed)
 Physical Therapy Evaluation Patient Details Name: Diane Freeman MRN: [id number] DOB: [birth year] Today's Date: 09/01/2023  History of Present Illness  Pt is a 78 y.o. female who presented 08/31/23 with bloody diarrhea, ascites, and vomiting. S/p ultrasound guided paracentesis from the right lower quadrant 6/1. CT with evidence of infectious vs inflammatory enterocolitis. ?Crohn's flare. PMH includes Crohns, hiatal hernia, esophageal stenosis, gastritis, Afib, GERD, anemia, dementia, HLD   Clinical Impression  Pt presents with condition above and deficits mentioned below, see PT Problem List. Pt just recently discharged home from a SNF ~11 days PTA and her sister has been staying with her to assist her as needed since then. However, her sister reports she cannot stay much longer and the pt has very limited assistance available and cannot afford HH caregivers. She typically lives alone in a mobile home with 3 STE. Since d/c from the SNF, she has been mobilizing without an AD due to the RW not being delivered until recently right before this admission. Currently, the pt demonstrates deficits in safety awareness, balance, strength, and activity tolerance. She is requiring up to minA for transfers and gait bouts in the room without an AD. Her sister reports the pt can seem cognitively good with other people for brief periods of times but then becomes disoriented and confused other times. Her sister reports the pt fell off the bedside commode at home and it took +2 assist and an hour to get her back up off the floor. Her sister also reports the pt is noncompliant with meds and is not hydrating or eating adequately. Discussed long term plans for care for pt with pt and her family. Family reports they cannot afford HH caregivers and they cannot stay with her forever and assist her as much as she is currently needing. Discussed options for home with support vs SNF for further rehab to improve safety prior to d/c home  vs long-term ALF solution and notified the pt and family that it has to be their decision. At this time, the pt could benefit from short-term inpatient rehab, < 3 hours/day, to maximize her independence and safety with all functional mobility with likely the best option being long-term ALF care. If she does opt to d/c home then she will need HHPT and max HH services due to her high risk for falls and injuries based on her balance and cognitive deficits. Will continue to follow acutely.        If plan is discharge home, recommend the following: A little help with walking and/or transfers;A little help with bathing/dressing/bathroom;Assistance with cooking/housework;Direct supervision/assist for medications management;Direct supervision/assist for financial management;Assist for transportation;Help with stairs or ramp for entrance   Can travel by private vehicle   Yes    Equipment Recommendations Other (comment) (shower chair)  Recommendations for Other Services       Functional Status Assessment Patient has had a recent decline in their functional status and demonstrates the ability to make significant improvements in function in a reasonable and predictable amount of time.     Precautions / Restrictions Precautions Precautions: Fall;Other (comment) Recall of Precautions/Restrictions: Intact Precaution/Restrictions Comments: enteric precautions Restrictions Weight Bearing Restrictions Per Provider Order: No      Mobility  Bed Mobility Overal bed mobility: Needs Assistance Bed Mobility: Supine to Sit     Supine to sit: Supervision, HOB elevated, Used rails     General bed mobility comments: Supervision for safety exiting L EOB with HOB elevated    Transfers Overall  transfer level: Needs assistance Equipment used: None Transfers: Sit to/from Stand Sit to Stand: Min assist, Contact guard assist           General transfer comment: CGA-light minA to power up to stand from  EOB 1x and commode 1x, unsteadiness noted but no LOB    Ambulation/Gait Ambulation/Gait assistance: Contact guard assist, Min assist Gait Distance (Feet): 15 Feet (x2 bouts of ~15 ft each bout) Assistive device: None Gait Pattern/deviations: Step-through pattern, Decreased stride length, Trunk flexed Gait velocity: reduced Gait velocity interpretation: <1.8 ft/sec, indicate of risk for recurrent falls   General Gait Details: Pt takes slow, small, unsteady steps with flexed posture. CGA-light minA for balance and safety throughout. Pt ambulated to bathroom then back to recliner.  Stairs            Wheelchair Mobility     Tilt Bed    Modified Rankin (Stroke Patients Only)       Balance Overall balance assessment: Mild deficits observed, not formally tested                                           Pertinent Vitals/Pain Pain Assessment Pain Assessment: Faces Faces Pain Scale: No hurt Pain Intervention(s): Monitored during session    Home Living Family/patient expects to be discharged to:: Private residence Living Arrangements: Alone Available Help at Discharge: Family;Available PRN/intermittently Type of Home: Mobile home Home Access: Stairs to enter Entrance Stairs-Rails: None Entrance Stairs-Number of Steps: 3   Home Layout: One level Home Equipment: Agricultural consultant (2 wheels) Additional Comments: recently her sister has been staying with her to assist since d/c from SNF ~11 days PTA, but sister cannot stay for much longer    Prior Function Prior Level of Function : Independent/Modified Independent             Mobility Comments: Pt has been ambulating without UE support since d/c from SNF ~11 days PTA due to no RW at the home yet, the RW just got delivered PTA; has fallen off commode and took PT, sister, and x1 hour to get pt up off the floor ADLs Comments: sister has recently been staying and assisting pt as needed since recent d/c from  SNF ~11 days PTA     Extremity/Trunk Assessment   Upper Extremity Assessment Upper Extremity Assessment: Defer to OT evaluation    Lower Extremity Assessment Lower Extremity Assessment: Generalized weakness (MMT scores of 4- to 4 grossly bil)    Cervical / Trunk Assessment Cervical / Trunk Assessment: Kyphotic  Communication   Communication Communication: No apparent difficulties    Cognition Arousal: Alert Behavior During Therapy: Impulsive (mildly)   PT - Cognitive impairments: Awareness, Memory, Attention, Safety/Judgement                       PT - Cognition Comments: Pt needed cues repeated at times, either due to poor attention to them or due to poor recall. Pt can be mildly impulsive and needs cues to maintain her safety. She had little awareness of her lines, needing cues to avoid getting tangled. Poor insight into her deficits impacting her safety at d/c and the fact her sister cannot stay to assist her forever Following commands: Impaired Following commands impaired: Follows multi-step commands with increased time     Cueing Cueing Techniques: Verbal cues     General Comments  General comments (skin integrity, edema, etc.): discussed long term plans for care for pt with pt and her family; family reports they cannot afford HH caregivers and they cannot stay with her forever and assist her as much as she is currently needing; they report she has not been eating or hydrating well and has fallen with an inability to get back up since d/c from the SNF; discussed options for home with support vs SNF for further rehab to improve safety prior to d/c home vs long-term ALF solution and notified the pt and family that it has to be their decision    Exercises     Assessment/Plan    PT Assessment Patient needs continued PT services  PT Problem List Decreased strength;Decreased activity tolerance;Decreased balance;Decreased mobility;Decreased cognition;Decreased safety  awareness       PT Treatment Interventions DME instruction;Gait training;Stair training;Functional mobility training;Therapeutic activities;Therapeutic exercise;Balance training;Neuromuscular re-education;Patient/family education;Cognitive remediation    PT Goals (Current goals can be found in the Care Plan section)  Acute Rehab PT Goals Patient Stated Goal: to go home PT Goal Formulation: With patient/family Time For Goal Achievement: 09/15/23 Potential to Achieve Goals: Good    Frequency Min 2X/week     Co-evaluation               AM-PAC PT "6 Clicks" Mobility  Outcome Measure Help needed turning from your back to your side while in a flat bed without using bedrails?: A Little Help needed moving from lying on your back to sitting on the side of a flat bed without using bedrails?: A Little Help needed moving to and from a bed to a chair (including a wheelchair)?: A Little Help needed standing up from a chair using your arms (e.g., wheelchair or bedside chair)?: A Little Help needed to walk in hospital room?: Total (< 20 ft) Help needed climbing 3-5 steps with a railing? : Total 6 Click Score: 14    End of Session Equipment Utilized During Treatment: Gait belt Activity Tolerance: Patient tolerated treatment well Patient left: in chair;with call bell/phone within reach;with chair alarm set;with family/visitor present Nurse Communication: Mobility status PT Visit Diagnosis: Unsteadiness on feet (R26.81);Other abnormalities of gait and mobility (R26.89);Muscle weakness (generalized) (M62.81);History of falling (Z91.81);Difficulty in walking, not elsewhere classified (R26.2)    Time: 4098-1191 PT Time Calculation (min) (ACUTE ONLY): 32 min   Charges:   PT Evaluation $PT Eval Low Complexity: 1 Low PT Treatments $Therapeutic Activity: 8-22 mins PT General Charges $$ ACUTE PT VISIT: 1 Visit         Vernida Goodie, PT, DPT Acute Rehabilitation Services  Office:  [phone]   Ellyn Hack 09/01/2023, 3:15 PM

## 2023-09-01 NOTE — Procedures (Signed)
 PROCEDURE SUMMARY:  Successful ultrasound guided paracentesis from the right lower quadrant.  Yielded 3.2 L of clear yellow fluid.  No immediate complications.  The patient tolerated the procedure well.   Specimen sent for labs.  EBL < 2 mL  The patient has required >/=2 paracenteses in a 30 day period and a screening evaluation by the [facility] has been arranged.  Jetta Morrow, AGACNP-BC 09/01/2023, 12:28 PM

## 2023-09-01 NOTE — Progress Notes (Signed)
 PHARMACY - ANTICOAGULATION CONSULT NOTE  Pharmacy Consult for heparin  Indication: pulmonary embolus  No Known Allergies  Patient Measurements: Height: 5\' 3"  (160 cm) Weight: 61.4 kg (135 lb 5.8 oz) IBW/kg (Calculated) : 52.4 HEPARIN  DW (KG): 59  Vital Signs: Temp: 97.7 F (36.5 C) (06/01 0622) Temp Source: Oral (06/01 0622) BP: 131/67 (06/01 0820) Pulse Rate: 72 (06/01 0820)  Labs: Recent Labs    08/31/23 1605 09/01/23 0919 09/01/23 0928  HGB 10.2*  --  10.2*  HCT 32.1*  --  31.9*  PLT 179  --  168  APTT  --  38*  --   LABPROT  --  17.4*  --   INR  --  1.4*  --   HEPARINUNFRC  --  0.29*  --   CREATININE 0.88  --  0.89    Estimated Creatinine Clearance: 42.4 mL/min (by C-G formula based on SCr of 0.89 mg/dL).   Medical History: Past Medical History:  Diagnosis Date   Abdominal pain 12/03/2022   Adrenal nodule (HCC)    Anemia    Blood transfusion without reported diagnosis    Cataract    bil removed   Choledocholithiasis with obstruction    Colitis    Crohn's colitis (HCC) 07/11/2012   Crohn's disease (HCC) 06/10/2013   Dementia (HCC)  Acute encephalopathy, resolved. 07/21/2023   Diastolic dysfunction    a. 05/2013 Echo: EF 60-65%, no rwma, GrI DD, mild-mod TR.   Diverticulosis    Elevated blood pressure reading 02/21/2021   ESOPHAGEAL STRICTURE 11/27/2007   Qualifier: History of   By: Nelson-Smith CMA (AAMA), [name]         Esophagitis determined by endoscopy 06/11/2023   EGD showed LA Grade D reflux esophagitis 06/2023     Fatty liver    GERD with esophagitis 11/27/2007   EGD showed LA Grade D reflux esophagitis 06/2023        Hematemesis 06/09/2023   Hiatal hernia    History of esophageal stricture    History of left heart catheterization    a. 05/2013 Cath: Nl cors. Nl R heart filling pressures.  No L -> R shunt.  EF 65%.   Hx of gastritis    Hyperlipidemia 01/24/2018   Lower extremity edema 12/03/2022   Palpitations    a. 09/2014 Zio: Rare  PACs/PVCs.   Pyelonephritis 05/29/2022   Right shoulder pain 12/03/2022   Sepsis (HCC) 12/03/2022   UTI (urinary tract infection) 05/14/2013      Assessment: 19 yoF with hx recent PE on apixaban  PTA admitted for ongoing diarrhea. CT concerning for enterocolitis. Pharmacy to start heparin  drip while apixaban  on hold for possible procedures.   Pt reports compliance with Eliquis , though missed her AM dose 5/31 due to swallowing issues.  aPTT 38 and HL 0.29, both subtherapeutic with heparin  infusion at 850 units/hr. Hgb 10.2, plt 168 stable. No signs/symptoms of bleeding or issues with infusion reported per RN.   Goal of Therapy:  Heparin  level 0.3-0.7 units/ml aPTT 66-102 seconds Monitor platelets by anticoagulation protocol: Yes   Plan:  Increase heparin  infusion to 1000 units/h  Check aPTT and heparin  level in 8h Monitor CBC and for signs/symptoms of bleeding Follow up plan for procedures to transition back to DOAC  Jerrel Mor, PharmD PGY1 Pharmacy Resident 09/01/2023 10:52 AM

## 2023-09-01 NOTE — Plan of Care (Signed)
  Problem: Education: Goal: Knowledge of General Education information will improve Description: Including pain rating scale, medication(s)/side effects and non-pharmacologic comfort measures Outcome: Progressing   Problem: Elimination: Goal: Will not experience complications related to bowel motility Outcome: Progressing   Problem: Pain Managment: Goal: General experience of comfort will improve and/or be controlled Outcome: Progressing   Problem: Safety: Goal: Ability to remain free from injury will improve Outcome: Progressing   Problem: Skin Integrity: Goal: Risk for impaired skin integrity will decrease Outcome: Progressing

## 2023-09-01 NOTE — Assessment & Plan Note (Signed)
~   13 kg weight loss in the last month.  In the setting of diarrhea, dysphagia, poor p.o. intake.  Current diarrhea associated with nausea and decreased appetite also contributing to weight loss.  There is some concern for failure to thrive - PT, OT eval and treat - Appreciate RD - Monitor electrolytes and replete as needed - strict I/Os - Daily weights

## 2023-09-01 NOTE — Progress Notes (Signed)
 Daily Progress Note Intern Pager: [phone]  Patient name: Diane Freeman Medical record number: [id number] Date of birth: [birth year] Age: 78 y.o. Gender: female  Primary Care Provider: Sarahann Cumins, DO Consultants: None Code Status: Full code  Pt Overview and Major Events to Date:  5/21 admitted  Assessment and Plan:  Diane Freeman is a 78 y.o. female presenting with diarrhea, vomiting, and ascites with recent history of unintentional weight loss and dysphagia.   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 coli bacteremia.  Assessment & Plan Diarrhea Unclear etiology, CT with evidence of infectious vs inflammatory enterocolitis. ?Crohn's flare.  Diarrhea and decreased appetite likely cause of electrolyte disturbances. Nontender on exam, no BM since admission. Awaiting AM labs.  -Monitor output today - if continued diarrhea and/or bloody stool, likely GI consult -Follow-up C. difficile -Follow-up GIPP -Continue mesalamine  for Crohn's -ESR, CRP -Can consider IV methylprednisolone if GI infection ruled out  -AM CBC and CMP -AM mag -Repeat electrolytes as needed -gentle IV fluids -Low sodium diet given ascites  Ascites Large volume ascites noted on CT A&P. Abdominal distension without tenderness on exam.  It was also noted during previous hospitalization requiring paracentesis with 2.7 L removed.  CT did show chronic and stable splenomegaly and perirectal varices which is concerning for portal HTN. Hx fatty liver disease. Hypoalbuminemia noted. For ascites, she previously took Lasix  and spironolactone , unclear why she stopped -Diagnostic paracentesis ordered -Liver doppler -Consider GI consult as noted Weight loss ~ 13 kg weight loss in the last month.  In the setting of diarrhea, dysphagia, poor p.o. intake.  Current diarrhea associated with nausea and decreased  appetite also contributing to weight loss.  There is some concern for failure to thrive - PT, OT eval and treat - Appreciate RD - Monitor electrolytes and replete as needed - strict I/Os - Daily weights Dysphagia Present for over a week, difficulty swallowing food, pills and liquids, contributing to weight loss.  Does have a history of esophagitis and dysphagia. -Bedside swallow - Speech eval - Consider GI consult as noted - IV fluids Pyuria Asymptomatic, UA with leuks and few bacteria, WBCs but also squames. -Repeat UA Chronic health problem  Paroxysmal atrial fibrillation: Heart rate controlled on admission. Does not take rate control agents. Holding home eliquis  in setting of heparin  drip - switch back to Eliquis  if no procedure. GERD: Continue home Protonix      FEN/GI: Low sodium diet PPx: heparin  Dispo:Pending PT recommendations  pending clinical improvement .   Subjective:  NAEON, feels well this morning.  Denies having diarrhea today.  Denies abdominal pain.  Objective: Temp:  [97.2 F (36.2 C)-97.9 F (36.6 C)] 97.7 F (36.5 C) (06/01 0622) Pulse Rate:  [58-100] 72 (06/01 0820) Resp:  [16-25] 16 (06/01 0820) BP: (112-144)/(42-80) 131/67 (06/01 0820) SpO2:  [86 %-98 %] 94 % (06/01 0820) Weight:  [59 kg-61.4 kg] 61.4 kg (06/01 0622) Physical Exam: General: NAD, resting in bed comfortably Cardiovascular: RRR no murmurs Respiratory: CTAB, WOB Abdomen: Moderately distended, nontender Extremities: 2+ pitting edema bilateral lower extremities  Laboratory: Most recent CBC Lab Results  Component Value Date   WBC 5.5 08/31/2023   HGB 10.2 (L) 08/31/2023   HCT 32.1 (L) 08/31/2023   MCV 85.6 08/31/2023   PLT 179 08/31/2023   Most recent BMP    Latest Ref Rng & Units 08/31/2023  4:05 PM  BMP  Glucose 70 - 99 mg/dL 478   BUN 8 - 23 mg/dL 10   Creatinine 2.95 - 1.00 mg/dL 6.21   Sodium 308 - 657 mmol/L 137   Potassium 3.5 - 5.1 mmol/L 3.0   Chloride 98 -  111 mmol/L 106   CO2 22 - 32 mmol/L 23   Calcium  8.9 - 10.3 mg/dL 7.7     Awaiting morning labs   Edison Gore, MD 09/01/2023, 9:18 AM  PGY-2, [facility] FPTS Intern pager: [phone], text pages welcome Secure chat group [facility] Teaching Service

## 2023-09-01 NOTE — Assessment & Plan Note (Addendum)
 Unclear etiology, CT with evidence of infectious vs inflammatory enterocolitis. ?Crohn's flare.  Diarrhea and decreased appetite likely cause of electrolyte disturbances. Nontender on exam, no BM since admission. Awaiting AM labs.  -Monitor output today - if continued diarrhea and/or bloody stool, likely GI consult -Follow-up C. difficile -Follow-up GIPP -Continue mesalamine  for Crohn's -ESR, CRP -Can consider IV methylprednisolone if GI infection ruled out  -AM CBC and CMP -AM mag -Repeat electrolytes as needed -gentle IV fluids -Low sodium diet given ascites

## 2023-09-01 NOTE — Assessment & Plan Note (Addendum)
 Large volume ascites noted on CT A&P. Abdominal distension without tenderness on exam.  It was also noted during previous hospitalization requiring paracentesis with 2.7 L removed.  CT did show chronic and stable splenomegaly and perirectal varices which is concerning for portal HTN. Hx fatty liver disease. Hypoalbuminemia noted. For ascites, she previously took Lasix  and spironolactone , unclear why she stopped -Diagnostic paracentesis ordered -Liver doppler -Consider GI consult as noted

## 2023-09-01 NOTE — Assessment & Plan Note (Signed)
  Paroxysmal atrial fibrillation: Heart rate controlled on admission. Does not take rate control agents. Holding home eliquis  in setting of heparin  drip - switch back to Eliquis  if no procedure. GERD: Continue home Protonix

## 2023-09-02 DIAGNOSIS — R188 Other ascites: Secondary | ICD-10-CM

## 2023-09-02 DIAGNOSIS — E860 Dehydration: Secondary | ICD-10-CM | POA: Diagnosis not present

## 2023-09-02 DIAGNOSIS — E8809 Other disorders of plasma-protein metabolism, not elsewhere classified: Secondary | ICD-10-CM

## 2023-09-02 DIAGNOSIS — Z751 Person awaiting admission to adequate facility elsewhere: Secondary | ICD-10-CM | POA: Diagnosis not present

## 2023-09-02 DIAGNOSIS — R197 Diarrhea, unspecified: Secondary | ICD-10-CM | POA: Diagnosis not present

## 2023-09-02 LAB — COMPREHENSIVE METABOLIC PANEL WITH GFR
ALT: 13 U/L (ref 0–44)
AST: 19 U/L (ref 15–41)
Albumin: 1.7 g/dL — ABNORMAL LOW (ref 3.5–5.0)
Alkaline Phosphatase: 85 U/L (ref 38–126)
Anion gap: 6 (ref 5–15)
BUN: 8 mg/dL (ref 8–23)
CO2: 21 mmol/L — ABNORMAL LOW (ref 22–32)
Calcium: 7.4 mg/dL — ABNORMAL LOW (ref 8.9–10.3)
Chloride: 107 mmol/L (ref 98–111)
Creatinine, Ser: 0.9 mg/dL (ref 0.44–1.00)
GFR, Estimated: >60 mL/min (ref >60)
Glucose, Bld: 111 mg/dL — ABNORMAL HIGH (ref 70–99)
Potassium: 3.5 mmol/L (ref 3.5–5.1)
Sodium: 134 mmol/L — ABNORMAL LOW (ref 135–145)
Total Bilirubin: 0.9 mg/dL (ref 0.0–1.2)
Total Protein: 5.5 g/dL — ABNORMAL LOW (ref 6.5–8.1)

## 2023-09-02 LAB — URINALYSIS, ROUTINE W REFLEX MICROSCOPIC
Bilirubin Urine: NEGATIVE
Glucose, UA: NEGATIVE mg/dL
Ketones, ur: NEGATIVE mg/dL
Nitrite: POSITIVE — AB
Protein, ur: 30 mg/dL — AB
Specific Gravity, Urine: >1.046 — ABNORMAL HIGH (ref 1.005–1.030)
pH: 5 (ref 5.0–8.0)

## 2023-09-02 LAB — HEPARIN LEVEL (UNFRACTIONATED): Heparin Unfractionated: 0.19 [IU]/mL — ABNORMAL LOW (ref 0.30–0.70)

## 2023-09-02 LAB — CBC
HCT: 29.3 % — ABNORMAL LOW (ref 36.0–46.0)
Hemoglobin: 9.5 g/dL — ABNORMAL LOW (ref 12.0–15.0)
MCH: 27.6 pg (ref 26.0–34.0)
MCHC: 32.4 g/dL (ref 30.0–36.0)
MCV: 85.2 fL (ref 80.0–100.0)
Platelets: 163 10*3/uL (ref 150–400)
RBC: 3.44 MIL/uL — ABNORMAL LOW (ref 3.87–5.11)
RDW: 19.6 % — ABNORMAL HIGH (ref 11.5–15.5)
WBC: 3.4 10*3/uL — ABNORMAL LOW (ref 4.0–10.5)
nRBC: 0 % (ref 0.0–0.2)

## 2023-09-02 LAB — APTT
aPTT: 73 s — ABNORMAL HIGH (ref 24–36)
aPTT: 91 s — ABNORMAL HIGH (ref 24–36)

## 2023-09-02 LAB — MAGNESIUM: Magnesium: 1.8 mg/dL (ref 1.7–2.4)

## 2023-09-02 LAB — CYTOLOGY - NON PAP

## 2023-09-02 MED ORDER — APIXABAN 5 MG PO TABS
5.0000 mg | ORAL_TABLET | Freq: Two times a day (BID) | ORAL | Status: DC
  Administered 2023-09-02 – 2023-09-05 (×7): 5 mg via ORAL
  Filled 2023-09-02 (×7): qty 1

## 2023-09-02 MED ORDER — ENSURE PLUS HIGH PROTEIN PO LIQD
237.0000 mL | Freq: Two times a day (BID) | ORAL | Status: DC
  Administered 2023-09-02 – 2023-09-04 (×4): 237 mL via ORAL
  Filled 2023-09-02: qty 237

## 2023-09-02 MED ORDER — ADULT MULTIVITAMIN W/MINERALS CH
1.0000 | ORAL_TABLET | Freq: Every day | ORAL | Status: DC
  Administered 2023-09-02 – 2023-09-05 (×4): 1 via ORAL
  Filled 2023-09-02 (×4): qty 1

## 2023-09-02 NOTE — Progress Notes (Signed)
 PHARMACY - ANTICOAGULATION Pharmacy Consult for heparin  Indication: pulmonary embolus Brief A/P: aPTT within goal range Continue Heparin  at current rate   No Known Allergies  Patient Measurements: Height: 5\' 3"  (160 cm) Weight: 61.4 kg (135 lb 5.8 oz) IBW/kg (Calculated) : 52.4 HEPARIN  DW (KG): 59  Vital Signs: Temp: 97.7 F (36.5 C) (06/01 2039) Temp Source: Oral (06/01 2039) BP: 114/56 (06/01 2039) Pulse Rate: 74 (06/01 2039)  Labs: Recent Labs    08/31/23 1605 09/01/23 0919 09/01/23 0928 09/01/23 2340  HGB 10.2*  --  10.2*  --   HCT 32.1*  --  31.9*  --   PLT 179  --  168  --   APTT  --  38*  --  73*  LABPROT  --  17.4*  --   --   INR  --  1.4*  --   --   HEPARINUNFRC  --  0.29*  --   --   CREATININE 0.88  --  0.89  --     Estimated Creatinine Clearance: 42.4 mL/min (by C-G formula based on SCr of 0.89 mg/dL).  Assessment: 78 y.o. female with h/o PE, Eliquis  on hold for heparin   Goal of Therapy:  Heparin  level 0.3-0.7 units/ml aPTT 66-102 seconds Monitor platelets by anticoagulation protocol: Yes   Plan:  No change to heparin  Follow-up am labs.  Alleen Arbour  09/02/2023

## 2023-09-02 NOTE — TOC Progression Note (Signed)
 Transition of Care (TOC) - Progression Note   Received message that patient is not agreeable to rehab at a SNF. See TOC note.   NCM completed Medicaid PCS paperwork and placed in chart and asked attending team if they would be agreeable to sign. If so NCM will fax in to [facility], they would arrange a home assessment and then have to approve same.   NCM can arrange home health PT, however they would not be in home daily or for long hours.   NCM spoke to patient at bedside. Patient declining SNF. Called patient's sister Russ Course and placed on speaker phone. They have a brother at [facility]. Patient would be agreeable to rehab at [facility]. NCM secure chatted team. Await to see if [facility] accepts and patient has any SNF days left     Patient Details  Name: Diane Freeman MRN: [id number] Date of Birth: 1946-02-26  [facility]) CM/SW Contact  Marena Witts, Arturo Late, RN Phone Number: 09/02/2023, 1:10 PM  Clinical Narrative:       Expected Discharge Plan:  (TBD (Home w/ HH vs SNF)) Barriers to Discharge: Continued Medical Work up, Other (must enter comment) (Patient does not want SNF, family wants SNF)  Expected Discharge Plan and Services In-house Referral: Clinical Social Work Discharge Planning Services: CM Consult   Living arrangements for the past 2 months: Single Family Home                                       Social Determinants of Health (SDOH) Interventions SDOH Screenings   Food Insecurity: No Food Insecurity (08/31/2023)  Housing: Low Risk  (08/31/2023)  Transportation Needs: No Transportation Needs (08/31/2023)  Utilities: Not At Risk (08/31/2023)  Alcohol Screen: Low Risk  (12/31/2022)  Depression (PHQ2-9): High Risk (08/22/2023)  Financial Resource Strain: Medium Risk (08/20/2023)  Physical Activity: Insufficiently Active (12/31/2022)  Social Connections: Moderately Isolated (08/31/2023)  Stress: No Stress Concern Present (12/31/2022)  Tobacco  Use: Low Risk  (08/31/2023)  Health Literacy: Adequate Health Literacy (12/31/2022)    Readmission Risk Interventions    06/11/2023   11:24 AM  Readmission Risk Prevention Plan  Post Dischage Appt Complete  Medication Screening Complete  Transportation Screening Complete

## 2023-09-02 NOTE — Assessment & Plan Note (Addendum)
 Asymptomatic, no treatment necessary.

## 2023-09-02 NOTE — Plan of Care (Signed)

## 2023-09-02 NOTE — Assessment & Plan Note (Addendum)
 Paracentesis completed, yielded 3.2 liters of clear yellow fluid.  Gram stain is negative cultures pending.  Multiple PMNs seen. -recommend outpatient GI consult

## 2023-09-02 NOTE — Assessment & Plan Note (Addendum)
 Nausea, vomiting and diarrhea have resolved. Recommend personal care services at discharge to aid with feeding.  - PT, OT eval and treat - Appreciate RD - Monitor electrolytes and replete as needed - strict I/Os - Daily weights

## 2023-09-02 NOTE — Assessment & Plan Note (Addendum)
 Passed swallow with SLP, they feel her dysphagia is esophageal in nature. Patient taking appropriate PO.

## 2023-09-02 NOTE — NC FL2 (Signed)
 [name]  MEDICAID [id number] LEVEL OF CARE FORM     IDENTIFICATION  Patient Name: Diane Freeman Birthdate: 12-07-1945 Sex: female Admission Date (Current Location): 08/31/2023  [facility] and [place] Number:  Producer, television/film/video and Address:  The . [facility], [address] [place], [place] [address]      Provider Number: [id number]  Attending Physician Name and Address:  McDiarmid, Demetra Filter, MD  Relative Name and Phone Number:       Current Level of Care: Hospital Recommended Level of Care: Skilled Nursing Facility Prior Approval Number:    Date Approved/Denied:   PASRR Number: [id number]  Discharge Plan: SNF    Current Diagnoses: Patient Active Problem List   Diagnosis Date Noted   Hypomagnesemia 09/01/2023   History of hiatal hernia 09/01/2023   Hypoalbuminemia 09/01/2023   Splenomegaly 09/01/2023   Edema of small intestine 09/01/2023   Colon wall thickening 09/01/2023   Pyuria 09/01/2023   NASH (nonalcoholic steatohepatitis)    Weight loss 08/31/2023   Diarrhea 08/31/2023   Dysphagia 08/31/2023   Ascites 08/02/2023   Goals of care, counseling/discussion 08/01/2023   Pulmonary emboli (HCC) 07/31/2023   Dilated bile duct  Ascites 07/31/2023   Choledocholithiasis 07/31/2023   Hx of transudative ascites 07/31/2023   History of LA Grade IV esophagitis 07/31/2023   Normocytic anemia 07/21/2023   Chronic health problem 07/21/2023   Femoral deep venous thrombosis (DVT) (HCC) 07/21/2023   Sepsis due to Escherichia coli with encephalopathy and septic shock (HCC) 07/21/2023   Malnutrition of moderate degree (HCC) 07/21/2023   Staph aureus methacillin-sensitive UTI 07/21/2023   AKI (acute kidney injury) (HCC) 07/17/2023   Hematemesis 06/09/2023   Other fatigue 12/30/2022   Physical deconditioning 12/06/2022   Paroxysmal atrial fibrillation (HCC) 12/05/2022   Septic shock (HCC), resolved  E. coli Bacteremia 12/03/2022   Actinic keratoses  02/17/2018   Hyperlipidemia 01/24/2018   Diastolic CHF (HCC) 06/14/2013   Dehydration 06/12/2013   Crohn's disease (HCC) 06/10/2013   ESOPHAGEAL STRICTURE 11/27/2007   GERD with esophagitis 11/27/2007    Orientation RESPIRATION BLADDER Height & Weight     Self, Time, Place  Normal Continent Weight: 132 lb 15 oz (60.3 kg) Height:  5\' 3"  (160 cm)  BEHAVIORAL SYMPTOMS/MOOD NEUROLOGICAL BOWEL NUTRITION STATUS      Continent Diet (see dc summary)  AMBULATORY STATUS COMMUNICATION OF NEEDS Skin   Extensive Assist Verbally Normal                       Personal Care Assistance Level of Assistance  Bathing, Dressing, Feeding Bathing Assistance: Limited assistance Feeding assistance: Limited assistance Dressing Assistance: Limited assistance     Functional Limitations Info  Sight, Hearing, Speech Sight Info: Adequate Hearing Info: Adequate Speech Info: Adequate    SPECIAL CARE FACTORS FREQUENCY  PT (By licensed PT), OT (By licensed OT)     PT Frequency: 5x/week OT Frequency: 5x/week            Contractures Contractures Info: Not present    Additional Factors Info  Code Status, Allergies Code Status Info: Full Allergies Info: NKA           Current Medications (09/02/2023):  This is the current hospital active medication list Current Facility-Administered Medications  Medication Dose Route Frequency Provider Last Rate Last Admin   apixaban  (ELIQUIS ) tablet 5 mg  5 mg Oral BID Miller, Samantha, DO   5 mg at 09/02/23 1239   mesalamine  (LIALDA )  EC tablet 2.4 g  2.4 g Oral Q breakfast [place], RPH   2.4 g at 09/02/23 1914   pantoprazole  (PROTONIX ) EC tablet 40 mg  40 mg Oral BID Ernestina Headland, MD   40 mg at 09/02/23 7829     Discharge Medications: Please see discharge summary for a list of discharge medications.  Relevant Imaging Results:  Relevant Lab Results:   Additional Information SS# [id number]  Diane Freeman C Diane Freeman, LCSWA

## 2023-09-02 NOTE — TOC Initial Note (Addendum)
 Transition of Care [facility]) - Initial/Assessment Note    Patient Details  Name: Diane Freeman MRN: [id number] Date of Birth: 1946-03-07  [facility]) CM/SW Contact:    Arron Big, LCSWA Phone Number: 09/02/2023, 12:41 PM  Clinical Narrative:      CSW met patient at bedside to discuss PT recs for SNF. Patient is oriented x3 at this time and stated she does not want to go to SNF. Per chart review, during previous hospitalization patient was deemed to have capacity on 5/1. Patient stated she has family support, her sister and son, and they can assist her at home. Patient stated she would be agreeable to [facility] services. CSW notified NCM.   CSW called patients sister, Russ Course [phone], to follow up on if she can assist patient at home. Russ Course states that she can check in on her sister but is not able to provide the care patient needs. Patty stated, patients son works a lot. Patty agrees with PT rec for SNF. CSW encouraged Patty to speak with patient about going to SNF for STR. Patty gave CSW permission to fax patient out, but does not want [facility]. Patient has [place] Medicaid and can get PCS through PCP. TOC can assist with application if MD is agreeable to signing paperwork. CSW informed treatment team of above information.   CSW left VM for [facility] admissions to confirm how many days patient was at facility and if she has STR days left through insurance.   2:14 PM Per [facility] admissions, patient was there from 5/7 - 5/20. Patient has [place] Medicaid, who will pay patients copays.   2:19 PM CSW sent referral to [facility] per family/patient request. CSW reached out to admissions to review referral, awaiting response.   TOC will continue to follow.    Expected Discharge Plan:  (TBD (Home w/ HH vs SNF)) Barriers to Discharge: Continued Medical Work up, Other (must enter comment) (Patient does not want SNF, family wants SNF)   Patient Goals and CMS Choice Patient states  their goals for this hospitalization and ongoing recovery are:: To go home          Expected Discharge Plan and Services In-house Referral: Clinical Social Work Discharge Planning Services: CM Consult   Living arrangements for the past 2 months: Single Family Home                                      Prior Living Arrangements/Services Living arrangements for the past 2 months: Single Family Home Lives with:: Self, Adult Children (Son) Patient language and need for interpreter reviewed:: Yes Do you feel safe going back to the place where you live?: Yes      Need for Family Participation in Patient Care: Yes (Comment) Care giver support system in place?: Yes (comment) Current home services: DME (walker, cane,) Criminal Activity/Legal Involvement Pertinent to Current Situation/Hospitalization: No - Comment as needed  Activities of Daily Living   ADL Screening (condition at time of admission) Independently performs ADLs?: Yes (appropriate for developmental age) Is the patient deaf or have difficulty hearing?: No Does the patient have difficulty seeing, even when wearing glasses/contacts?: No Does the patient have difficulty concentrating, remembering, or making decisions?: No  Permission Sought/Granted Permission sought to share information with : Facility Medical sales representative, Family Supports Permission granted to share information with : Yes, Verbal Permission Granted  Share Information  with NAME: Nadyne Austin  Permission granted to share info w AGENCY: SNFs, HH agencies  Permission granted to share info w Relationship: sis, son     Emotional Assessment Appearance:: Appears stated age Attitude/Demeanor/Rapport: Engaged Affect (typically observed): Stable Orientation: : Oriented to Self, Oriented to Place, Oriented to  Time Alcohol / Substance Use: Not Applicable Psych Involvement: No (comment)  Admission diagnosis:  Failure to thrive in adult  [R62.7] Diarrhea, unspecified type [R19.7] Patient Active Problem List   Diagnosis Date Noted   Hypomagnesemia 09/01/2023   History of hiatal hernia 09/01/2023   Hypoalbuminemia 09/01/2023   Splenomegaly 09/01/2023   Edema of small intestine 09/01/2023   Colon wall thickening 09/01/2023   Pyuria 09/01/2023   NASH (nonalcoholic steatohepatitis)    Weight loss 08/31/2023   Diarrhea 08/31/2023   Dysphagia 08/31/2023   Ascites 08/02/2023   Goals of care, counseling/discussion 08/01/2023   Pulmonary emboli (HCC) 07/31/2023   Dilated bile duct  Ascites 07/31/2023   Choledocholithiasis 07/31/2023   Hx of transudative ascites 07/31/2023   History of LA Grade IV esophagitis 07/31/2023   Normocytic anemia 07/21/2023   Chronic health problem 07/21/2023   Femoral deep venous thrombosis (DVT) (HCC) 07/21/2023   Sepsis due to Escherichia coli with encephalopathy and septic shock (HCC) 07/21/2023   Malnutrition of moderate degree (HCC) 07/21/2023   Staph aureus methacillin-sensitive UTI 07/21/2023   AKI (acute kidney injury) (HCC) 07/17/2023   Hematemesis 06/09/2023   Other fatigue 12/30/2022   Physical deconditioning 12/06/2022   Paroxysmal atrial fibrillation (HCC) 12/05/2022   Septic shock (HCC), resolved  E. coli Bacteremia 12/03/2022   Actinic keratoses 02/17/2018   Hyperlipidemia 01/24/2018   Diastolic CHF (HCC) 06/14/2013   Dehydration 06/12/2013   Crohn's disease (HCC) 06/10/2013   ESOPHAGEAL STRICTURE 11/27/2007   GERD with esophagitis 11/27/2007   PCP:  Sarahann Cumins, DO Pharmacy:   [facility] [address] [place] [address] Phone: [phone] Fax: [phone]     Social Drivers of Health (SDOH) Social History: SDOH Screenings   Food Insecurity: No Food Insecurity (08/31/2023)  Housing: Low Risk  (08/31/2023)  Transportation Needs: No Transportation Needs (08/31/2023)  Utilities: Not At Risk (08/31/2023)  Alcohol Screen:  Low Risk  (12/31/2022)  Depression (PHQ2-9): High Risk (08/22/2023)  Financial Resource Strain: Medium Risk (08/20/2023)  Physical Activity: Insufficiently Active (12/31/2022)  Social Connections: Moderately Isolated (08/31/2023)  Stress: No Stress Concern Present (12/31/2022)  Tobacco Use: Low Risk  (08/31/2023)  Health Literacy: Adequate Health Literacy (12/31/2022)   SDOH Interventions:     Readmission Risk Interventions    06/11/2023   11:24 AM  Readmission Risk Prevention Plan  Post Dischage Appt Complete  Medication Screening Complete  Transportation Screening Complete

## 2023-09-02 NOTE — Progress Notes (Signed)
 Initial Nutrition Assessment  DOCUMENTATION CODES:   Not applicable  INTERVENTION:   - Liberalize diet to Regular with thin liquids to provide additional food choices at mealtimes  - Ensure Enlive po BID, each supplement provides 350 kcal and 20 grams of protein.  - MVI with minerals daily  NUTRITION DIAGNOSIS:   Increased nutrient needs related to acute illness as evidenced by estimated needs.  GOAL:   Patient will meet greater than or equal to 90% of their needs  MONITOR:   PO intake, Supplement acceptance, Labs, Weight trends, I & O's  REASON FOR ASSESSMENT:   Consult Poor PO  ASSESSMENT:   78 year old female who presented to the ED on 08/31/23 with diarrhea and increased urinary frequency x 2 weeks. PMH of paroxysmal atrial fibrillation, DVT on Eliquis , CHF, Crohn's disease, dementia, ascites, dysphagia. CT concerning for enterocolitis.  6/01 - s/p paracentesis with 3.2 L fluid removed  Pt unavailable at time of RD visit. Unable to obtain diet and weight history at this time.  Noted pt with a weight loss of 7.7 kg or 11.3% from 03/22/23 to 09/02/23. This is significant for timeframe and concerning for malnutrition. Suspect weight of 71.9 kg on 08/25/23 and 07/24/23 are inaccurate given weight on 07/12/23 was 59 kg and weight on 08/07/23 was 62.4 kg.  Will liberalize diet to Regular to provide additional options at meals and promote PO intake due to concern for malnutrition. Will also add daily MVI with minerals and Ensure Plus High Protein.  Suspect pt meets criteria for malnutrition but unable to confirm without NFPE.  Noted pt is medically stable for discharge pending decision regarding disposition.  Meal Completion: 70% x 1 documented meal on 2 gram sodium diet  Medications reviewed and include: protonix   Labs reviewed: sodium 134, hemoglobin 9.5  NUTRITION - FOCUSED PHYSICAL EXAM:  Unable to complete.  Diet Order:   Diet Order             Diet regular  Room service appropriate? Yes with Assist; Fluid consistency: Thin  Diet effective now                   EDUCATION NEEDS:   Not appropriate for education at this time  Skin:  Skin Assessment: Reviewed RN Assessment  Last BM:  09/01/23 small type 7  Height:   Ht Readings from Last 1 Encounters:  08/31/23 5\' 3"  (1.6 m)    Weight:   Wt Readings from Last 1 Encounters:  09/02/23 60.3 kg    BMI:  Body mass index is 23.55 kg/m.  Estimated Nutritional Needs:   Kcal:  1600-1800  Protein:  80-95 grams  Fluid:  1.6-1.8 L    Ernestina Headland, MS, RD, LDN Registered Dietitian II Please see AMiON for contact information.

## 2023-09-02 NOTE — Assessment & Plan Note (Addendum)
 C. difficile negative, discontinue enteric precautions at this time.  GI panel in process. No further episodes of diarrhea -Follow-up GIPP -Continue mesalamine  for Crohn's -AM CBC and CMP -AM mag -Repeat electrolytes as needed -gentle IV fluids -Low sodium diet given ascites

## 2023-09-02 NOTE — Progress Notes (Signed)
     Daily Progress Note Intern Pager: [phone]  Patient name: Diane Freeman Medical record number: [id number] Date of birth: 01-13-1946 Age: 78 y.o. Gender: female  Primary Care Provider: Sarahann Cumins, DO Consultants: None Code Status: Full Code  Pt Overview and Major Events to Date:  5/21: admitted  Assessment and Plan:  This is a 78 year old female patient presenting with diarrhea, vomiting and ascites with recent history of unintentional weight loss and dysphagia.  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 coli bacteremia. At this time, patient is medically stable for discharge pending decision on placement vs home health. Assessment & Plan Diarrhea C. difficile negative, discontinue enteric precautions at this time.  GI panel in process. No further episodes of diarrhea -Follow-up GIPP -Continue mesalamine  for Crohn's -AM CBC and CMP -AM mag -Repeat electrolytes as needed -gentle IV fluids -Low sodium diet given ascites  Ascites Paracentesis completed, yielded 3.2 liters of clear yellow fluid.  Gram stain is negative cultures pending.  Multiple PMNs seen. -recommend outpatient GI consult Weight loss Nausea, vomiting and diarrhea have resolved. Recommend personal care services at discharge to aid with feeding.  - PT, OT eval and treat - Appreciate RD - Monitor electrolytes and replete as needed - strict I/Os - Daily weights Dysphagia Passed swallow with SLP, they feel her dysphagia is esophageal in nature. Patient taking appropriate PO. Pyuria Asymptomatic, no treatment necessary. Chronic health problem Paroxysmal atrial fibrillation: Heart rate controlled on admission. Does not take rate control agents. Holding home eliquis  in setting of heparin  drip - switch back to Eliquis  if no procedure. GERD: Continue home Protonix   FEN/GI: Salt  restriction PPx: Heparin  Dispo:Pending PT recommendations  pending clinical improvement .   Subjective:  Patient comfortable and without complaint.  Objective: Temp:  [97.5 F (36.4 C)-98 F (36.7 C)] 98 F (36.7 C) (06/02 0404) Pulse Rate:  [72-74] 72 (06/02 0404) Resp:  [16-18] 18 (06/02 0404) BP: (114-131)/(51-67) 116/51 (06/02 0404) SpO2:  [94 %-99 %] 98 % (06/02 0404) Weight:  [60.3 kg] 60.3 kg (06/02 0500) Physical Exam: General: Well appearing, elderly female, no distress Cardiovascular: RRR, no m/r/g Respiratory: CTAB, no increased WOB Abdomen: Flat, soft, non tender Extremities: 2+ pulses bilaterally  Laboratory: Most recent CBC Lab Results  Component Value Date   WBC 3.8 (L) 09/01/2023   HGB 10.2 (L) 09/01/2023   HCT 31.9 (L) 09/01/2023   MCV 84.2 09/01/2023   PLT 168 09/01/2023   Most recent BMP    Latest Ref Rng & Units 09/01/2023    9:28 AM  BMP  Glucose 70 - 99 mg/dL 85   BUN 8 - 23 mg/dL 8   Creatinine 1.47 - 8.29 mg/dL 5.62   Sodium 130 - 865 mmol/L 138   Potassium 3.5 - 5.1 mmol/L 3.4   Chloride 98 - 111 mmol/L 108   CO2 22 - 32 mmol/L 23   Calcium  8.9 - 10.3 mg/dL 7.7     Diane Calandra, DO 09/02/2023, 7:10 AM  PGY-1, [facility] FPTS Intern pager: [phone], text pages welcome Secure chat group [facility] Teaching Service

## 2023-09-02 NOTE — Assessment & Plan Note (Addendum)
 Paroxysmal atrial fibrillation: Heart rate controlled on admission. Does not take rate control agents. Holding home eliquis  in setting of heparin  drip - switch back to Eliquis  if no procedure. GERD: Continue home Protonix 

## 2023-09-03 ENCOUNTER — Encounter (HOSPITAL_COMMUNITY): Payer: Self-pay | Admitting: Student

## 2023-09-03 DIAGNOSIS — K7581 Nonalcoholic steatohepatitis (NASH): Secondary | ICD-10-CM | POA: Diagnosis not present

## 2023-09-03 DIAGNOSIS — Z751 Person awaiting admission to adequate facility elsewhere: Secondary | ICD-10-CM | POA: Diagnosis not present

## 2023-09-03 DIAGNOSIS — Z8719 Personal history of other diseases of the digestive system: Secondary | ICD-10-CM | POA: Insufficient documentation

## 2023-09-03 DIAGNOSIS — R634 Abnormal weight loss: Secondary | ICD-10-CM | POA: Diagnosis not present

## 2023-09-03 LAB — GASTROINTESTINAL PANEL BY PCR, STOOL (REPLACES STOOL CULTURE)

## 2023-09-03 LAB — CBC
HCT: 31.6 % — ABNORMAL LOW (ref 36.0–46.0)
Hemoglobin: 10.3 g/dL — ABNORMAL LOW (ref 12.0–15.0)
MCH: 27.4 pg (ref 26.0–34.0)
MCHC: 32.6 g/dL (ref 30.0–36.0)
MCV: 84 fL (ref 80.0–100.0)
Platelets: 170 10*3/uL (ref 150–400)
RBC: 3.76 MIL/uL — ABNORMAL LOW (ref 3.87–5.11)
RDW: 19.5 % — ABNORMAL HIGH (ref 11.5–15.5)
WBC: 4 10*3/uL (ref 4.0–10.5)
nRBC: 0 % (ref 0.0–0.2)

## 2023-09-03 NOTE — Assessment & Plan Note (Signed)
 C. difficile negative, GI pathogen panel negative for all infectious etiologies.  Diarrhea has resolved. -Continue mesalamine  for Crohn's

## 2023-09-03 NOTE — Plan of Care (Signed)
  Problem: Pain Managment: Goal: General experience of comfort will improve and/or be controlled Outcome: Progressing   Problem: Safety: Goal: Ability to remain free from injury will improve Outcome: Progressing

## 2023-09-03 NOTE — Progress Notes (Signed)
     Daily Progress Note Intern Pager: [phone]  Patient name: Diane Freeman Medical record number: [id number] Date of birth: 10-18-1945 Age: 78 y.o. Gender: female  Primary Care Provider: Sarahann Cumins, DO Consultants: none Code Status: full  Pt Overview and Major Events to Date:  5/21: Admitted  Assessment and Plan:  Is a 78 year old female patient initially admitted for diarrhea vomiting and ascites with her recent history of unintentional weight loss and dysphagia.  At this time her illness has resolved and she is medically stable for discharge. Assessment & Plan Diarrhea (Resolved: 09/03/2023) C. difficile negative, GI pathogen panel negative for all infectious etiologies.  Diarrhea has resolved. -Continue mesalamine  for Crohn's Ascites Paracentesis completed, yielded 3.2 liters of clear yellow fluid.  Gram stain is negative cultures pending.  Multiple PMNs seen. -recommend outpatient GI consult Weight loss Nausea, vomiting and diarrhea have resolved. Recommend personal care services at discharge to aid with feeding.  - PT, OT eval and treat - Appreciate RD - Monitor electrolytes and replete as needed - strict I/Os - Daily weights Dysphagia Passed swallow with SLP, they feel her dysphagia is esophageal in nature. Patient taking appropriate PO. Pyuria Asymptomatic, no treatment necessary. Chronic health problem Paroxysmal atrial fibrillation: Heart rate controlled on admission. Does not take rate control agents. Holding home eliquis  in setting of heparin  drip - switch back to Eliquis  if no procedure. GERD: Continue home Protonix  Crohn's disease: Continue home mesalamine   FEN/GI: Regular PPx: Home Eliquis  Dispo:SNF versus home with home health and personal care services.   Subjective:  Feels well this morning. Ready to go home  Objective: Temp:  [97.7 F (36.5 C)-98.8 F (37.1 C)] 98.8 F (37.1 C) (06/03 0453) Pulse Rate:  [82-103] 86 (06/03 0453) Resp:   [16] 16 (06/03 0453) BP: (98-109)/(58-66) 98/66 (06/03 0453) SpO2:  [96 %-98 %] 96 % (06/03 0453) Weight:  [57.6 kg] 57.6 kg (06/03 0500) Physical Exam: General: Well appearing, no distress Cardiovascular: RRR, no m/r/g Respiratory: CTAB, no increased WOB Abdomen: Flat, soft, nontender  Laboratory: Most recent CBC Lab Results  Component Value Date   WBC 4.0 09/03/2023   HGB 10.3 (L) 09/03/2023   HCT 31.6 (L) 09/03/2023   MCV 84.0 09/03/2023   PLT 170 09/03/2023   Most recent BMP    Latest Ref Rng & Units 09/02/2023    7:02 AM  BMP  Glucose 70 - 99 mg/dL 578   BUN 8 - 23 mg/dL 8   Creatinine 4.69 - 6.29 mg/dL 5.28   Sodium 413 - 244 mmol/L 134   Potassium 3.5 - 5.1 mmol/L 3.5   Chloride 98 - 111 mmol/L 107   CO2 22 - 32 mmol/L 21   Calcium  8.9 - 10.3 mg/dL 7.4     [name], DO 09/03/2023, 8:06 AM  PGY-1, [facility] [facility] FPTS Intern pager: [phone], text pages welcome Secure chat group [facility] Teaching Service

## 2023-09-03 NOTE — Care Management Important Message (Signed)
 Important Message  Patient Details  Name: Diane Freeman MRN: [id number] Date of Birth: 17-May-1945   Important Message Given:  Yes - Medicare IM     Felix Host 09/03/2023, 1:35 PM

## 2023-09-03 NOTE — Assessment & Plan Note (Signed)
 Paracentesis completed, yielded 3.2 liters of clear yellow fluid.  Gram stain is negative cultures pending.  Multiple PMNs seen. -recommend outpatient GI consult

## 2023-09-03 NOTE — Progress Notes (Signed)
 Mobility Specialist Progress Note:    09/03/23 1100  Mobility  Activity Ambulated with assistance in hallway  Level of Assistance Contact guard assist, steadying assist  Assistive Device Front wheel walker  Distance Ambulated (ft) 100 ft  Activity Response Tolerated well  Mobility Referral Yes  Mobility visit 1 Mobility  Mobility Specialist Start Time (ACUTE ONLY) 1110  Mobility Specialist Stop Time (ACUTE ONLY) 1121  Mobility Specialist Time Calculation (min) (ACUTE ONLY) 11 min   Received pt in bed having no complaints and agreeable to mobility. Pt was asymptomatic throughout ambulation and returned to room w/o fault. Left in bed w/ call bell in reach and all needs met. Bed alarm on.   D'Vante Nolon Baxter Mobility Specialist Please contact via Special educational needs teacher or Rehab office at [phone]

## 2023-09-03 NOTE — Plan of Care (Signed)
  Problem: Activity: Goal: Risk for activity intolerance will decrease Outcome: Progressing   Problem: Elimination: Goal: Will not experience complications related to bowel motility Outcome: Progressing Goal: Will not experience complications related to urinary retention Outcome: Progressing   Problem: Pain Managment: Goal: General experience of comfort will improve and/or be controlled Outcome: Progressing

## 2023-09-03 NOTE — Plan of Care (Signed)
   Problem: Elimination: Goal: Will not experience complications related to bowel motility Outcome: Progressing   Problem: Pain Managment: Goal: General experience of comfort will improve and/or be controlled Outcome: Progressing   Problem: Safety: Goal: Ability to remain free from injury will improve Outcome: Progressing

## 2023-09-03 NOTE — Assessment & Plan Note (Signed)
 Paroxysmal atrial fibrillation: Heart rate controlled on admission. Does not take rate control agents. Holding home eliquis  in setting of heparin  drip - switch back to Eliquis  if no procedure. GERD: Continue home Protonix  Crohn's disease: Continue home mesalamine 

## 2023-09-03 NOTE — TOC Progression Note (Addendum)
 Transition of Care [facility]) - Progression Note    Patient Details  Name: Diane Freeman MRN: [id number] Date of Birth: Jul 31, 1945  [facility] of [facility] CM/SW Contact  Arron Big, [place] Phone Number: 09/03/2023, 11:34 AM  Clinical Narrative:   CSW reached out to [facility] admissions to review referral - awaiting response. CSW updated Patty with this information.   2:52 PM No response from [facility], CSW resubmitted patient referral.   TOC will continue to follow.    Expected Discharge Plan:  (TBD (Home w/ HH vs SNF)) Barriers to Discharge: Continued Medical Work up, Other (must enter comment) (Patient does not want SNF, family wants SNF)  Expected Discharge Plan and Services In-house Referral: Clinical Social Work Discharge Planning Services: CM Consult   Living arrangements for the past 2 months: Single Family Home                                       Social Determinants of Health (SDOH) Interventions SDOH Screenings   Food Insecurity: No Food Insecurity (08/31/2023)  Housing: Low Risk  (08/31/2023)  Transportation Needs: No Transportation Needs (08/31/2023)  Utilities: Not At Risk (08/31/2023)  Alcohol Screen: Low Risk  (12/31/2022)  Depression (PHQ2-9): High Risk (08/22/2023)  Financial Resource Strain: Medium Risk (08/20/2023)  Physical Activity: Insufficiently Active (12/31/2022)  Social Connections: Moderately Isolated (08/31/2023)  Stress: No Stress Concern Present (12/31/2022)  Tobacco Use: Low Risk  (08/31/2023)  Health Literacy: Adequate Health Literacy (12/31/2022)    Readmission Risk Interventions    06/11/2023   11:24 AM  Readmission Risk Prevention Plan  Post Dischage Appt Complete  Medication Screening Complete  Transportation Screening Complete

## 2023-09-03 NOTE — Assessment & Plan Note (Signed)
 Asymptomatic, no treatment necessary.

## 2023-09-03 NOTE — Assessment & Plan Note (Signed)
 Passed swallow with SLP, they feel her dysphagia is esophageal in nature. Patient taking appropriate PO.

## 2023-09-03 NOTE — Assessment & Plan Note (Signed)
 Nausea, vomiting and diarrhea have resolved. Recommend personal care services at discharge to aid with feeding.  - PT, OT eval and treat - Appreciate RD - Monitor electrolytes and replete as needed - strict I/Os - Daily weights

## 2023-09-04 ENCOUNTER — Other Ambulatory Visit: Payer: Self-pay | Admitting: Family Medicine

## 2023-09-04 DIAGNOSIS — R2689 Other abnormalities of gait and mobility: Secondary | ICD-10-CM | POA: Insufficient documentation

## 2023-09-04 DIAGNOSIS — K7581 Nonalcoholic steatohepatitis (NASH): Secondary | ICD-10-CM | POA: Diagnosis not present

## 2023-09-04 DIAGNOSIS — R6889 Other general symptoms and signs: Secondary | ICD-10-CM

## 2023-09-04 DIAGNOSIS — R188 Other ascites: Secondary | ICD-10-CM | POA: Diagnosis not present

## 2023-09-04 LAB — CBC
HCT: 32.9 % — ABNORMAL LOW (ref 36.0–46.0)
Hemoglobin: 10.6 g/dL — ABNORMAL LOW (ref 12.0–15.0)
MCH: 26.9 pg (ref 26.0–34.0)
MCHC: 32.2 g/dL (ref 30.0–36.0)
MCV: 83.5 fL (ref 80.0–100.0)
Platelets: 183 10*3/uL (ref 150–400)
RBC: 3.94 MIL/uL (ref 3.87–5.11)
RDW: 19.3 % — ABNORMAL HIGH (ref 11.5–15.5)
WBC: 3.9 10*3/uL — ABNORMAL LOW (ref 4.0–10.5)
nRBC: 0 % (ref 0.0–0.2)

## 2023-09-04 LAB — COMPREHENSIVE METABOLIC PANEL WITH GFR
ALT: 12 U/L (ref 0–44)
AST: 24 U/L (ref 15–41)
Albumin: 1.7 g/dL — ABNORMAL LOW (ref 3.5–5.0)
Alkaline Phosphatase: 87 U/L (ref 38–126)
Anion gap: 5 (ref 5–15)
BUN: 11 mg/dL (ref 8–23)
CO2: 21 mmol/L — ABNORMAL LOW (ref 22–32)
Calcium: 7.9 mg/dL — ABNORMAL LOW (ref 8.9–10.3)
Chloride: 107 mmol/L (ref 98–111)
Creatinine, Ser: 0.93 mg/dL (ref 0.44–1.00)
GFR, Estimated: >60 mL/min (ref >60)
Glucose, Bld: 89 mg/dL (ref 70–99)
Potassium: 3.6 mmol/L (ref 3.5–5.1)
Sodium: 133 mmol/L — ABNORMAL LOW (ref 135–145)
Total Bilirubin: 0.9 mg/dL (ref 0.0–1.2)
Total Protein: 6 g/dL — ABNORMAL LOW (ref 6.5–8.1)

## 2023-09-04 MED ORDER — ENSURE PLUS HIGH PROTEIN PO LIQD: 237.0000 mL | Freq: Two times a day (BID) | ORAL | Status: AC

## 2023-09-04 NOTE — Progress Notes (Signed)
 Physical Therapy Treatment Patient Details Name: Diane Freeman MRN: [id number] DOB: January 20, 1946 Today's Date: 09/04/2023   History of Present Illness Pt is a 78 y.o. female who presented 08/31/23 with bloody diarrhea, ascites, and vomiting. S/p ultrasound guided paracentesis from the right lower quadrant 6/1. CT with evidence of infectious vs inflammatory enterocolitis. ?Crohn's flare. PMH includes Crohns, hiatal hernia, esophageal stenosis, gastritis, Afib, GERD, anemia, dementia, HLD    PT Comments  Pt received in supine and agreeable to session. Pt is making good progress towards mobility goals. Pt able to tolerate increased gait distance today and demonstrates improved stability with RW support. Pt able to perform stair trial with CGA for safety, however demonstrates increased difficulty due to BLE weakness. Pt able to stand at EOB without UE support to doff gown before returning to supine. Pt continues to benefit from PT services to progress toward functional mobility goals.     If plan is discharge home, recommend the following: A little help with walking and/or transfers;A little help with bathing/dressing/bathroom;Assistance with cooking/housework;Direct supervision/assist for medications management;Direct supervision/assist for financial management;Assist for transportation;Help with stairs or ramp for entrance   Can travel by private vehicle     Yes  Equipment Recommendations  Other (comment) (shower chair)    Recommendations for Other Services       Precautions / Restrictions Precautions Precautions: Fall;Other (comment) Recall of Precautions/Restrictions: Intact Restrictions Weight Bearing Restrictions Per Provider Order: No     Mobility  Bed Mobility Overal bed mobility: Needs Assistance Bed Mobility: Supine to Sit, Sit to Supine     Supine to sit: Supervision, HOB elevated, Used rails Sit to supine: Supervision, HOB elevated, Used rails   General bed mobility  comments: increased time    Transfers Overall transfer level: Needs assistance Equipment used: Rolling walker (2 wheels) Transfers: Sit to/from Stand Sit to Stand: Supervision                Ambulation/Gait Ambulation/Gait assistance: Contact guard assist, Supervision Gait Distance (Feet): 230 Feet Assistive device: Rolling walker (2 wheels) Gait Pattern/deviations: Step-through pattern, Decreased stride length, Trunk flexed Gait velocity: reduced     General Gait Details: good stability with RW support. CGA pogressing to supervision and cues for upright posture   Stairs Stairs: Yes Stairs assistance: Contact guard assist Stair Management: One rail Right Number of Stairs: 3 General stair comments: increased difficulty with ascent due to BLE weakness requiring BUE support on rail. Pt reaching to therapist for UE support during descent. CGA for safety   Wheelchair Mobility     Tilt Bed    Modified Rankin (Stroke Patients Only)       Balance Overall balance assessment: Mild deficits observed, not formally tested                                          Communication Communication Communication: No apparent difficulties  Cognition Arousal: Alert Behavior During Therapy: WFL for tasks assessed/performed   PT - Cognitive impairments: Awareness, Memory, Attention, Safety/Judgement                         Following commands: Impaired Following commands impaired: Follows multi-step commands with increased time    Cueing Cueing Techniques: Verbal cues  Exercises      General Comments        Pertinent Vitals/Pain Pain Assessment Pain  Assessment: No/denies pain     PT Goals (current goals can now be found in the care plan section) Acute Rehab PT Goals Patient Stated Goal: to go home PT Goal Formulation: With patient/family Time For Goal Achievement: 09/15/23 Progress towards PT goals: Progressing toward goals     Frequency    Min 2X/week       AM-PAC PT "6 Clicks" Mobility   Outcome Measure  Help needed turning from your back to your side while in a flat bed without using bedrails?: A Little Help needed moving from lying on your back to sitting on the side of a flat bed without using bedrails?: A Little Help needed moving to and from a bed to a chair (including a wheelchair)?: A Little Help needed standing up from a chair using your arms (e.g., wheelchair or bedside chair)?: A Little Help needed to walk in hospital room?: A Little Help needed climbing 3-5 steps with a railing? : A Little 6 Click Score: 18    End of Session Equipment Utilized During Treatment: Gait belt Activity Tolerance: Patient tolerated treatment well Patient left: in bed;with bed alarm set;with call bell/phone within reach Nurse Communication: Mobility status PT Visit Diagnosis: Unsteadiness on feet (R26.81);Other abnormalities of gait and mobility (R26.89);Muscle weakness (generalized) (M62.81);History of falling (Z91.81);Difficulty in walking, not elsewhere classified (R26.2)     Time: 1610-9604 PT Time Calculation (min) (ACUTE ONLY): 11 min  Charges:    $Gait Training: 8-22 mins PT General Charges $$ ACUTE PT VISIT: 1 Visit                     Michaelle Adolphus, PTA Acute Rehabilitation Services Secure Chat Preferred  [phone]    Michaelle Adolphus 09/04/2023, 10:21 AM

## 2023-09-04 NOTE — Progress Notes (Signed)
     Daily Progress Note Intern Pager: [phone]  Patient name: Diane Freeman Medical record number: [id number] Date of birth: [birth year] Age: 78 y.o. Gender: female  Primary Care Provider: Sarahann Cumins, DO Consultants: none Code Status: Full  Pt Overview and Major Events to Date:  5/31: Admitted  Assessment and Plan:  This is a 78 year old female patient initially admitted for diarrhea and vomiting as well as ascites with a recent history of unintentional weight loss and dysphagia.  At this time her illness has resolved and she is medically stable for discharge awaiting approval for SNF. Assessed capacity, patient demonstrated clear understanding of the potential risks of going home instead of to SNF and clearly stated her desires.  Assessment & Plan Ascites Paracentesis completed, yielded 3.2 liters of clear yellow fluid.  Gram stain is negative cultures no growth to date.  Fluid assessment shows nonmalignant effusion. -recommend outpatient GI consult Weight loss Nausea, vomiting and diarrhea have resolved. Recommend personal care services at discharge to aid with feeding.  - PT, OT eval and treat - Appreciate RD - Monitor electrolytes and replete as needed - strict I/Os - Daily weights Chronic health problem Paroxysmal atrial fibrillation: Heart rate controlled on admission. Does not take rate control agents. Holding home eliquis  in setting of heparin  drip - switch back to Eliquis  if no procedure. GERD: Continue home Protonix  Crohn's disease: Continue home mesalamine   FEN/GI: Regular PPx: Eliquis  Dispo:SNF versus HH PT today, pending call back from sister regarding [facility]   Subjective:  Patient feels well and is able to clearly state the consequences of going home instead of to a SNF, up to and including death.   Objective: Temp:  [97.5 F (36.4 C)-98.9 F (37.2 C)] 97.5 F (36.4 C) (06/04 0447) Pulse Rate:  [78-87] 80 (06/04 0447) Resp:  [16-18] 18  (06/04 0447) BP: (100-128)/(56-71) 120/58 (06/04 0447) SpO2:  [97 %-98 %] 97 % (06/04 0447) Weight:  [58.1 kg] 58.1 kg (06/04 0448) Physical Exam: General: well appearing, elderly female Cardiovascular: RRR, no m/r/g  Laboratory: Most recent CBC Lab Results  Component Value Date   WBC 3.9 (L) 09/04/2023   HGB 10.6 (L) 09/04/2023   HCT 32.9 (L) 09/04/2023   MCV 83.5 09/04/2023   PLT 183 09/04/2023   Most recent BMP    Latest Ref Rng & Units 09/04/2023    5:34 AM  BMP  Glucose 70 - 99 mg/dL 89   BUN 8 - 23 mg/dL 11   Creatinine 1.30 - 1.00 mg/dL 8.65   Sodium 784 - 696 mmol/L 133   Potassium 3.5 - 5.1 mmol/L 3.6   Chloride 98 - 111 mmol/L 107   CO2 22 - 32 mmol/L 21   Calcium  8.9 - 10.3 mg/dL 7.9     [name], DO 09/04/2023, 7:38 AM  PGY-1, [place] [facility] FPTS Intern pager: [phone], text pages welcome Secure chat group [facility] Teaching Service

## 2023-09-04 NOTE — Progress Notes (Signed)
 Occupational Therapy Treatment Patient Details Name: Diane Freeman MRN: [id number] DOB: 05-08-1945 Today's Date: 09/04/2023   History of present illness Pt is a 78 y.o. female who presented 08/31/23 with bloody diarrhea, ascites, and vomiting. S/p ultrasound guided paracentesis from the right lower quadrant 6/1. CT with evidence of infectious vs inflammatory enterocolitis. ?Crohn's flare. PMH includes Crohns, hiatal hernia, esophageal stenosis, gastritis, Afib, GERD, anemia, dementia, HLD   OT comments  Pt progressing well towards goals. Pt mobilizing well but presents at a significantly high fall risk. Especially in bathroom, pt tends to disregard RW, and attempts to mobilize without an AD. Pt with decreased problem solving and poor judgement making her a high fall risk. OT educated pt on benefits of short term rehab before d/c home. Pt from home alone, and would need 24/7 supervision d/t cog. Continue to recommend <3 hours of skilled rehab daily to optimize independence levels. Will continue to follow acutely.       If plan is discharge home, recommend the following:  A little help with walking and/or transfers;A little help with bathing/dressing/bathroom   Equipment Recommendations  Other (comment) (Defer to next venue)    Recommendations for Other Services      Precautions / Restrictions Precautions Precautions: Fall;Other (comment) Recall of Precautions/Restrictions: Impaired Restrictions Weight Bearing Restrictions Per Provider Order: No       Mobility Bed Mobility Overal bed mobility: Needs Assistance Bed Mobility: Supine to Sit     Supine to sit: Supervision, HOB elevated, Used rails     General bed mobility comments: increased time    Transfers Overall transfer level: Needs assistance Equipment used: Rolling walker (2 wheels) Transfers: Sit to/from Stand, Bed to chair/wheelchair/BSC Sit to Stand: Supervision     Step pivot transfers: Contact guard assist      General transfer comment: Cues for safety, pt tends to mobilize without RW, needs cues to maintain     Balance Overall balance assessment: Mild deficits observed, not formally tested       ADL either performed or assessed with clinical judgement   ADL Overall ADL's : Needs assistance/impaired     Grooming: Wash/dry hands;Contact guard assist;Standing Grooming Details (indicate cue type and reason): Cues for safety with RW at sink       Toilet Transfer: Contact guard assist;Ambulation;Rolling walker (2 wheels);Regular Toilet;Grab bars Toilet Transfer Details (indicate cue type and reason): CGA for balance cues for safety Toileting- Clothing Manipulation and Hygiene: Contact guard assist;Sitting/lateral lean Toileting - Clothing Manipulation Details (indicate cue type and reason): CGA for balance to manage pants in standing     Functional mobility during ADLs: Contact guard assist;Rolling walker (2 wheels);Cueing for sequencing;Cueing for safety General ADL Comments: Pt at CGA to close supervision with ADLs, pt tends to abandon RW with tasks    Extremity/Trunk Assessment Upper Extremity Assessment Upper Extremity Assessment: Generalized weakness   Lower Extremity Assessment Lower Extremity Assessment: Defer to PT evaluation        Vision   Vision Assessment?: No apparent visual deficits         Communication Communication Communication: No apparent difficulties   Cognition Arousal: Alert Behavior During Therapy: Impulsive Cognition: History of cognitive impairments             OT - Cognition Comments: Impulsive, decreased STM and problem solving                 Following commands: Impaired Following commands impaired: Follows multi-step commands with increased time  Cueing   Cueing Techniques: Verbal cues        General Comments VSS on RA    Pertinent Vitals/ Pain       Pain Assessment Pain Assessment: No/denies pain Pain Intervention(s):  Monitored during session   Frequency  Min 2X/week        Progress Toward Goals  OT Goals(current goals can now be found in the care plan section)  Progress towards OT goals: Progressing toward goals  Acute Rehab OT Goals Patient Stated Goal: To go home OT Goal Formulation: With patient/family Time For Goal Achievement: 09/15/23 Potential to Achieve Goals: Good ADL Goals Pt Will Transfer to Toilet: with supervision;ambulating;regular height toilet Pt/caregiver will Perform Home Exercise Program: Increased strength;With theraband;Both right and left upper extremity;With Supervision;With written HEP provided Additional ADL Goal #1: Pt will verbalize 3 fall prevention strategies to promote safety within the home  Plan         AM-PAC OT "6 Clicks" Daily Activity     Outcome Measure   Help from another person eating meals?: None Help from another person taking care of personal grooming?: A Little Help from another person toileting, which includes using toliet, bedpan, or urinal?: A Little Help from another person bathing (including washing, rinsing, drying)?: A Little Help from another person to put on and taking off regular upper body clothing?: A Little Help from another person to put on and taking off regular lower body clothing?: A Little 6 Click Score: 19    End of Session Equipment Utilized During Treatment: Gait belt;Rolling walker (2 wheels)  OT Visit Diagnosis: Unsteadiness on feet (R26.81);Other abnormalities of gait and mobility (R26.89);Repeated falls (R29.6);History of falling (Z91.81);Muscle weakness (generalized) (M62.81)   Activity Tolerance Patient tolerated treatment well   Patient Left with call bell/phone within reach;with bed alarm set;with family/visitor present;in chair   Nurse Communication Mobility status        Time: 1410-1420 OT Time Calculation (min): 10 min  Charges: OT General Charges $OT Visit: 1 Visit OT Treatments $Self Care/Home  Management : 8-22 mins  Diane Freeman, OT  Acute Rehabilitation Services Office [phone] Secure chat preferred   Diane Freeman 09/04/2023, 2:40 PM

## 2023-09-04 NOTE — Assessment & Plan Note (Signed)
 Nausea, vomiting and diarrhea have resolved. Recommend personal care services at discharge to aid with feeding.  - PT, OT eval and treat - Appreciate RD - Monitor electrolytes and replete as needed - strict I/Os - Daily weights

## 2023-09-04 NOTE — Plan of Care (Signed)
  Problem: Pain Managment: Goal: General experience of comfort will improve and/or be controlled Outcome: Progressing   Problem: Safety: Goal: Ability to remain free from injury will improve Outcome: Progressing

## 2023-09-04 NOTE — TOC Progression Note (Addendum)
 Transition of Care (TOC) - Progression Note  Patient being discharged today .   [facility] does not have a bed until Friday.   Attending spoke to patient and patient wants to discharge to home. DR Annabell Key, NCM went and spoke with patient at bedside with her sister Russ Course on speaker phone. Explained patient being discharged today and cannot wait for a bed at [facility] until Friday. Both voiced understanding. Patient does not want to go to another SNF, she wants to go home. Patient and sister both understand at home HHPT will only be about twice a week for a hour at a time. Patient understanding at SNF she would see PT daily . Can add HHSW to HHPT order, however, if patient gets home and decides to go to a SNF the process is longer and would not be able to be admitted to [facility] on Friday. Patient and sister voiced understanding.  NCM asked for MD to add HHSW to HHPT order and to sign shower chair order.   Randel Buss with [facility] accepted home health referral, if patient discharges to home today   Russ Course is at [facility] currently asking if they can admit [facility] today. If they can Alfred agreed to go to [facility] today. She mentioned they may have a possible discharge this afternoon.  Lincoln Renshaw with [facility] for shower chair   Patty understands that patient is discharged today   Patty called back , she was with Athena Bland at [facility]. He said he believes [facility] can take today and then move her to [facility] on Friday . [facility] SW waiting   to hear back from [facility].   [facility] called [facility] they are not sure if they will have a bed today or not . She will call SW back shortly. Patty aware   [facility], they can offer pt a bed, but unable to have one available today. they said they could take her first thing in the AM tomorrow.   Patient agreeable to go to [facility] tomorrow morning. MD will change discharge until tomorrow. Left Patty a voicemail . Patty returned  call and is in agreement with plan.   Updated Randel Buss with [facility]   Received a call from [facility]. Patient was active with them prior to admission. [facility] updated   Patient Details  Name: Diane Freeman MRN: [id number] Date of Birth: Jul 15, 1945  Transition of Care [facility]) CM/SW Contact  Lamar Naef, Arturo Late, RN Phone Number: 09/04/2023, 12:46 PM  Clinical Narrative:       Expected Discharge Plan:  (TBD (Home w/ HH vs SNF)) Barriers to Discharge: Continued Medical Work up, Other (must enter comment) (Patient does not want SNF, family wants SNF)  Expected Discharge Plan and Services In-house Referral: Clinical Social Work Discharge Planning Services: CM Consult   Living arrangements for the past 2 months: Single Family Home                                       Social Determinants of Health (SDOH) Interventions SDOH Screenings   Food Insecurity: No Food Insecurity (08/31/2023)  Housing: Low Risk  (08/31/2023)  Transportation Needs: No Transportation Needs (08/31/2023)  Utilities: Not At Risk (08/31/2023)  Alcohol Screen: Low Risk  (12/31/2022)  Depression (PHQ2-9): High Risk (08/22/2023)  Financial Resource Strain: Medium Risk (08/20/2023)  Physical Activity: Insufficiently Active (12/31/2022)  Social Connections: Moderately  Isolated (08/31/2023)  Stress: No Stress Concern Present (12/31/2022)  Tobacco Use: Low Risk  (08/31/2023)  Health Literacy: Adequate Health Literacy (12/31/2022)    Readmission Risk Interventions    06/11/2023   11:24 AM  Readmission Risk Prevention Plan  Post Dischage Appt Complete  Medication Screening Complete  Transportation Screening Complete

## 2023-09-04 NOTE — Assessment & Plan Note (Signed)
 Paroxysmal atrial fibrillation: Heart rate controlled on admission. Does not take rate control agents. Holding home eliquis  in setting of heparin  drip - switch back to Eliquis  if no procedure. GERD: Continue home Protonix  Crohn's disease: Continue home mesalamine 

## 2023-09-04 NOTE — Assessment & Plan Note (Signed)
 Paracentesis completed, yielded 3.2 liters of clear yellow fluid.  Gram stain is negative cultures no growth to date.  Fluid assessment shows nonmalignant effusion. -recommend outpatient GI consult

## 2023-09-04 NOTE — Discharge Instructions (Signed)
 Dear Annemarie I Douse,   Thank you for letting us  participate in your care! In this section, you will find a brief hospital admission summary of why you were admitted to the hospital, what happened during your admission, your diagnosis/diagnoses, and recommended follow up.  Primary diagnosis: Gastroenteritis Treatment plan: continue home medications   POST-HOSPITAL & CARE INSTRUCTIONS We recommend following up with your PCP within 1 week from being discharged from the hospital. Please let PCP/Specialists know of any changes in medications that were made which you will be able to see in the medications section of this packet. Please also follow up with GI to discuss ascites and Crohn's management  DOCTOR'S APPOINTMENTS & FOLLOW UP Future Appointments  Date Time Provider Department Center  09/09/2023 10:00 AM Suann Elms, LCSW [facility] None  09/10/2023  1:00 PM Saundra Curl [facility] None  09/20/2023  9:00 AM Armbruster, Lendon Queen, MD [facility]  10/08/2023  3:40 PM Armbruster, Lendon Queen, MD [facility]-GI [facility]     Thank you for choosing [facility]! Take care and be well!  Family Medicine Teaching Service Inpatient Team [place]  [facility]  [address] [place], [place] [address] [phone]

## 2023-09-05 ENCOUNTER — Other Ambulatory Visit: Payer: Self-pay | Admitting: *Deleted

## 2023-09-05 ENCOUNTER — Other Ambulatory Visit (HOSPITAL_COMMUNITY): Payer: Self-pay

## 2023-09-05 DIAGNOSIS — I2699 Other pulmonary embolism without acute cor pulmonale: Secondary | ICD-10-CM | POA: Diagnosis not present

## 2023-09-05 DIAGNOSIS — K219 Gastro-esophageal reflux disease without esophagitis: Secondary | ICD-10-CM | POA: Diagnosis not present

## 2023-09-05 DIAGNOSIS — E860 Dehydration: Secondary | ICD-10-CM | POA: Diagnosis not present

## 2023-09-05 DIAGNOSIS — K6389 Other specified diseases of intestine: Secondary | ICD-10-CM | POA: Diagnosis not present

## 2023-09-05 DIAGNOSIS — K5090 Crohn's disease, unspecified, without complications: Secondary | ICD-10-CM | POA: Diagnosis not present

## 2023-09-05 DIAGNOSIS — E785 Hyperlipidemia, unspecified: Secondary | ICD-10-CM | POA: Diagnosis not present

## 2023-09-05 DIAGNOSIS — I480 Paroxysmal atrial fibrillation: Secondary | ICD-10-CM | POA: Diagnosis not present

## 2023-09-05 DIAGNOSIS — Z7401 Bed confinement status: Secondary | ICD-10-CM | POA: Diagnosis not present

## 2023-09-05 DIAGNOSIS — E876 Hypokalemia: Secondary | ICD-10-CM | POA: Diagnosis not present

## 2023-09-05 DIAGNOSIS — R634 Abnormal weight loss: Secondary | ICD-10-CM | POA: Diagnosis not present

## 2023-09-05 DIAGNOSIS — E8809 Other disorders of plasma-protein metabolism, not elsewhere classified: Secondary | ICD-10-CM | POA: Diagnosis not present

## 2023-09-05 DIAGNOSIS — R8281 Pyuria: Secondary | ICD-10-CM | POA: Diagnosis not present

## 2023-09-05 DIAGNOSIS — D649 Anemia, unspecified: Secondary | ICD-10-CM | POA: Diagnosis not present

## 2023-09-05 DIAGNOSIS — E559 Vitamin D deficiency, unspecified: Secondary | ICD-10-CM | POA: Diagnosis not present

## 2023-09-05 DIAGNOSIS — I5030 Unspecified diastolic (congestive) heart failure: Secondary | ICD-10-CM | POA: Diagnosis not present

## 2023-09-05 DIAGNOSIS — R4182 Altered mental status, unspecified: Secondary | ICD-10-CM | POA: Diagnosis not present

## 2023-09-05 DIAGNOSIS — R161 Splenomegaly, not elsewhere classified: Secondary | ICD-10-CM | POA: Diagnosis not present

## 2023-09-05 DIAGNOSIS — I2694 Multiple subsegmental pulmonary emboli without acute cor pulmonale: Secondary | ICD-10-CM | POA: Diagnosis not present

## 2023-09-05 DIAGNOSIS — R188 Other ascites: Secondary | ICD-10-CM

## 2023-09-05 DIAGNOSIS — E440 Moderate protein-calorie malnutrition: Secondary | ICD-10-CM | POA: Diagnosis not present

## 2023-09-05 DIAGNOSIS — R2689 Other abnormalities of gait and mobility: Secondary | ICD-10-CM | POA: Diagnosis not present

## 2023-09-05 DIAGNOSIS — R531 Weakness: Secondary | ICD-10-CM | POA: Diagnosis not present

## 2023-09-05 DIAGNOSIS — I82419 Acute embolism and thrombosis of unspecified femoral vein: Secondary | ICD-10-CM | POA: Diagnosis not present

## 2023-09-05 DIAGNOSIS — Z8719 Personal history of other diseases of the digestive system: Secondary | ICD-10-CM | POA: Diagnosis not present

## 2023-09-05 DIAGNOSIS — K7581 Nonalcoholic steatohepatitis (NASH): Secondary | ICD-10-CM | POA: Diagnosis not present

## 2023-09-05 DIAGNOSIS — R197 Diarrhea, unspecified: Secondary | ICD-10-CM | POA: Diagnosis not present

## 2023-09-05 MED ORDER — FUROSEMIDE 20 MG PO TABS
20.0000 mg | ORAL_TABLET | Freq: Every day | ORAL | Status: DC
  Administered 2023-09-05: 20 mg via ORAL
  Filled 2023-09-05: qty 1

## 2023-09-05 MED ORDER — FUROSEMIDE 20 MG PO TABS
20.0000 mg | ORAL_TABLET | Freq: Every day | ORAL | 0 refills | Status: AC
  Filled 2023-09-05: qty 30, 30d supply, fill #0

## 2023-09-05 MED ORDER — SPIRONOLACTONE 25 MG PO TABS
12.5000 mg | ORAL_TABLET | Freq: Every day | ORAL | 0 refills | Status: AC
  Filled 2023-09-05: qty 30, 60d supply, fill #0

## 2023-09-05 MED ORDER — SPIRONOLACTONE 25 MG PO TABS
25.0000 mg | ORAL_TABLET | Freq: Every day | ORAL | 0 refills | Status: DC
  Filled 2023-09-05: qty 30, 30d supply, fill #0

## 2023-09-05 NOTE — TOC Transition Note (Signed)
 Transition of Care [facility]) - Discharge Note   Patient Details  Name: Diane Freeman MRN: [id number] Date of Birth: 28-Aug-1945  Transition of Care [facility]) CM/SW Contact:  Andon Villard A [place], LCSW Phone Number: 09/05/2023, 10:13 AM   Clinical Narrative:     Patient will DC to: [facility]  Anticipated DC date: 09/05/23  Family notified: Pt declined, pt notified her sister Hospital doctor by: Lyna Sandhoff      Per MD patient ready for DC to [facility]. RN, patient, patient's family, and facility notified of DC. Discharge Summary and FL2 sent to facility. RN to call report prior to discharge [phone]). DC packet on chart. Ambulance transport requested for patient.     CSW will sign off for now as social work intervention is no longer needed. Please consult us  again if new needs arise.   Final next level of care: Skilled Nursing Facility Barriers to Discharge: Barriers Resolved   Patient Goals and CMS Choice Patient states their goals for this hospitalization and ongoing recovery are:: To go home          Discharge Placement              Patient chooses bed at: [facility] Patient to be transferred to facility by: [facility] Name of family member notified: Pt declined, contacted family herself Patient and family notified of of transfer: 09/05/23  Discharge Plan and Services Additional resources added to the After Visit Summary for   In-house Referral: Clinical Social Work Discharge Planning Services: CM Consult                                 Social Drivers of Health (SDOH) Interventions SDOH Screenings   Food Insecurity: No Food Insecurity (08/31/2023)  Housing: Low Risk  (08/31/2023)  Transportation Needs: No Transportation Needs (08/31/2023)  Utilities: Not At Risk (08/31/2023)  Alcohol Screen: Low Risk  (12/31/2022)  Depression (PHQ2-9): High Risk (08/22/2023)  Financial Resource Strain: Medium Risk (08/20/2023)  Physical Activity: Insufficiently Active (12/31/2022)   Social Connections: Moderately Isolated (08/31/2023)  Stress: No Stress Concern Present (12/31/2022)  Tobacco Use: Low Risk  (08/31/2023)  Health Literacy: Adequate Health Literacy (12/31/2022)     Readmission Risk Interventions    06/11/2023   11:24 AM  Readmission Risk Prevention Plan  Post Dischage Appt Complete  Medication Screening Complete  Transportation Screening Complete

## 2023-09-05 NOTE — Progress Notes (Signed)
 Attempted to call report to [facility] spoke with [place] who tried to transfer me to the receiving nurse the call was disconnected. Transport here to take patient at this time will attempt to call report again.

## 2023-09-05 NOTE — Discharge Summary (Addendum)
 Family Medicine Teaching [facility] Discharge Summary  Patient name: Diane Freeman Medical record number: [id number] Date of birth: 1945-04-17 Age: 78 y.o. Gender: female Date of Admission: 08/31/2023  Date of Discharge: 09/05/2023 Admitting Physician: Ernestina Headland, MD  Primary Care Provider: Sarahann Cumins, DO Consultants: None  Indication for Hospitalization: Dehydration 2/2 Diarrheal Illness  Discharge Diagnoses/Problem List:  Principal Problem for Admission: Dehydration 2/2 Diarrheal Illness Other Problems addressed during stay:  Active Problems:   Decreased activity tolerance   Chronic health problem   Femoral deep venous thrombosis (DVT) (HCC)   Malnutrition of moderate degree (HCC)   Pulmonary emboli (HCC)   Ascites   Weight loss   NASH (nonalcoholic steatohepatitis)   Splenomegaly   Person awaiting admission to adequate facility elsewhere   Impairment of balance    Brief Hospital Course:  LUTHER NEWHOUSE is a 78 y.o. female presenting from home with FTT, decreased PO intake and diarrhea. PMHx includes Crohn's disease, large hiatal hernia, history of PE/DVT, A-fib.  Her hospital course is outlined below:   Swallowing Difficulty  Diarrhea Weight loss 15 lb in one month. Previously discharged to SNF on 08/07/23 Now returning for decreased PO intake and overall weight loss, attributed to swallowing difficulties and diarrhea. CTAP with suspected enterocolitis inflammatory versus infectious.  C diff and GIPP were negative, and her diarrhea resolved prior to discharge. She received appropriate supportive care through out her admission. PT and OT recommended SNF for short term rehab, however patient did not desire to go to SNF. After further discussion and explanation of risks with patient and her family, patient agreed to SNF placement.   Ascites:  No prior history of liver pathology. Underwent diagnostic/therapeutic paracentesis with removal of 3.2 L of yellow  fluid. Cytology was inflammatory in nature. Recommend outpatient GI follow up.    PCP Follow Up:  Outpatient GI follow up for ERCP Serial abdominal exams for ascites Monitor weight, if she gains 3lbs in a day or 5lbs in a week she will likely need a therapeutic tap Repeat CBC and CMET 6/9, adjust spironolactone  and lasix  accordingly VBCI referral for family support and possible long term care     Disposition: SNF  Discharge Condition: Stable  Discharge Exam:  Vitals:   09/05/23 0555 09/05/23 0755  BP: (!) 106/57 109/63  Pulse: 87 85  Resp: 18 18  Temp: 98.7 F (37.1 C) 98.5 F (36.9 C)  SpO2: 96% 97%   General: A&O, NAD Cardiac: RRR, no m/r/g Respiratory: CTAB, normal WOB, no w/c/r GI: Soft, NTTP, non-distended  Extremities: NTTP, no peripheral edema.  Significant Procedures: none  Significant Labs and Imaging:  Recent Labs  Lab 09/04/23 0534  WBC 3.9*  HGB 10.6*  HCT 32.9*  PLT 183   Recent Labs  Lab 09/04/23 0534  NA 133*  K 3.6  CL 107  CO2 21*  GLUCOSE 89  BUN 11  CREATININE 0.93  CALCIUM  7.9*  ALKPHOS 87  AST 24  ALT 12  ALBUMIN  1.7*    Results/Tests Pending at Time of Discharge: none  Discharge Medications:  Allergies as of 09/05/2023   No Known Allergies      Medication List     TAKE these medications    acetaminophen  500 MG tablet Commonly known as: TYLENOL  Take 1,000 mg by mouth every 6 (six) hours as needed.   apixaban  5 MG Tabs tablet Commonly known as: ELIQUIS  Take 1 tablet (5 mg total) by mouth 2 (two) times daily.  Cholecalciferol  50 MCG (2000 UT) Tabs Commonly known as: Vitamin D3 Super Strength Take 1 tablet (2,000 Units total) by mouth every morning.   feeding supplement Liqd Take 237 mLs by mouth 2 (two) times daily between meals.   ferrous sulfate  325 (65 FE) MG tablet Take 1 tablet (325 mg total) by mouth daily with breakfast.   folic acid  400 MCG tablet Commonly known as: FOLVITE  Take 1 tablet (400 mcg  total) by mouth every morning.   furosemide  20 MG tablet Commonly known as: LASIX  Take 1 tablet (20 mg total) by mouth daily.   mesalamine  0.375 g 24 hr capsule Commonly known as: APRISO  Take 4 capsules (1.5 g total) by mouth daily. Please keep your appointment in July for any further refills. Thank you What changed: additional instructions   pantoprazole  40 MG tablet Commonly known as: PROTONIX  Take 1 tablet (40 mg total) by mouth 2 (two) times daily.   spironolactone  25 MG tablet Commonly known as: Aldactone  Take 0.5 tablets (12.5 mg total) by mouth daily.               Durable Medical Equipment  (From admission, onward)           Start     Ordered   09/04/23 1241  For home use only DME Shower stool  Once        09/04/23 1240            Discharge Instructions: Please refer to Patient Instructions section of EMR for full details.  Patient was counseled important signs and symptoms that should prompt return to medical care, changes in medications, dietary instructions, activity restrictions, and follow up appointments.   Follow-Up Appointments:  Contact information for follow-up providers     Everhart, Kirstie, DO Follow up.   Specialty: Family Medicine Why: follow up 6/23 @ 1:45pm Contact information: [address] [place] [address] [phone]              Contact information for after-discharge care     Destination     [facility] .   Service: Skilled Nursing Contact information: [address] [place]  [address] [phone]                     [name], DO 09/05/2023, 9:39 AM PGY-1, [facility] Family Medicine

## 2023-09-06 DIAGNOSIS — E440 Moderate protein-calorie malnutrition: Secondary | ICD-10-CM | POA: Diagnosis not present

## 2023-09-06 DIAGNOSIS — I2699 Other pulmonary embolism without acute cor pulmonale: Secondary | ICD-10-CM | POA: Diagnosis not present

## 2023-09-06 DIAGNOSIS — K5090 Crohn's disease, unspecified, without complications: Secondary | ICD-10-CM | POA: Diagnosis not present

## 2023-09-06 DIAGNOSIS — I82419 Acute embolism and thrombosis of unspecified femoral vein: Secondary | ICD-10-CM | POA: Diagnosis not present

## 2023-09-06 LAB — CULTURE, BODY FLUID W GRAM STAIN -BOTTLE: Culture: NO GROWTH

## 2023-09-06 NOTE — Progress Notes (Signed)
 [facility] Hospital Liaison Note  09/06/2023  Diane Freeman May 01, 1945 [id number]  Location: RN Hospital Liaison screened the patient remotely at [facility].  Insurance: Medicare   Diane Freeman is a 78 y.o. female who is a Primary Care Patient of Everhart, Prudencio Browner, DO [facility].  The patient was screened for 30 day readmission hospitalization with noted high risk score for unplanned readmission risk with 4 IP/2 ED in 6 months.  The patient was assessed for potential Care Management service needs for post hospital transition for care coordination. Review of patient's electronic medical record reveals patient was admitted with Diarrhea (weight loss). Pt transition to SNF level of care to [facility] for rehab. This facility will continue to address pt's ongoing needs.   VBCI Care Management/Population Health does not replace or interfere with any arrangements made by the Inpatient Transition of Care team.   For questions contact:   Lilla Reichert, RN, BSN Hospital Liaison [place]   [facility], Population Health Office Hours MTWF  8:00 am-6:00 pm Direct Dial: [phone] mobile [email or web address]

## 2023-09-09 ENCOUNTER — Ambulatory Visit: Payer: Self-pay | Admitting: Student

## 2023-09-09 ENCOUNTER — Inpatient Hospital Stay: Payer: Self-pay | Admitting: Family Medicine

## 2023-09-09 ENCOUNTER — Other Ambulatory Visit: Payer: Self-pay

## 2023-09-09 ENCOUNTER — Other Ambulatory Visit: Payer: Self-pay | Admitting: Licensed Clinical Social Worker

## 2023-09-09 NOTE — Patient Outreach (Signed)
 Complex Care Management   Visit Note  09/09/2023  Name:  Diane Freeman MRN: [id number] DOB: 04-Oct-1945  Situation: Referral received for Complex Care Management related to LCSW assessment of patient needs I obtained verbal consent from Theodora Fish, sister.  Visit completed with Patty Jessup   on the phone. Patient recently readmitted to hospital then went to [facility] . Family hopes to get her transferred to sister facility, [facility] where her brother resides  Background:   Past Medical History:  Diagnosis Date   Abdominal pain 12/03/2022   Adrenal nodule (HCC)    Anemia    Blood transfusion without reported diagnosis    Cataract    bil removed   Choledocholithiasis with obstruction    Colitis    Crohn's colitis (HCC) 07/11/2012   Crohn's disease (HCC) 06/10/2013   Dementia (HCC)  Acute encephalopathy, resolved. 07/21/2023   Diarrhea 08/31/2023   Diastolic dysfunction    a. 05/2013 Echo: EF 60-65%, no rwma, GrI DD, mild-mod TR.   Diverticulosis    Elevated blood pressure reading 02/21/2021   ESOPHAGEAL STRICTURE 11/27/2007   Qualifier: History of   By: Nelson-Smith CMA (AAMA), [name]         Esophagitis determined by endoscopy 06/11/2023   EGD showed LA Grade D reflux esophagitis 06/2023     Fatty liver    GERD with esophagitis 11/27/2007   EGD showed LA Grade D reflux esophagitis 06/2023        Hematemesis 06/09/2023   Hiatal hernia    History of esophageal stricture    History of left heart catheterization    a. 05/2013 Cath: Nl cors. Nl R heart filling pressures.  No L -> R shunt.  EF 65%.   Hx of gastritis    Hyperlipidemia 01/24/2018   Lower extremity edema 12/03/2022   Palpitations    a. 09/2014 [facility]: Rare PACs/PVCs.   Pyelonephritis 05/29/2022   Right shoulder pain 12/03/2022   Sepsis (HCC) 12/03/2022   UTI (urinary tract infection) 05/14/2013    Assessment: Patient Reported Symptoms:  Unable to assess due to patient admission to [facility]       08/22/2023    3:33 PM  Depression screen PHQ 2/9  Decreased Interest 1  Down, Depressed, Hopeless 0  PHQ - 2 Score 1  Altered sleeping 0  Tired, decreased energy 3  Change in appetite 3  Feeling bad or failure about yourself  0  Trouble concentrating 2  Moving slowly or fidgety/restless 2  Suicidal thoughts 0  PHQ-9 Score 11    There were no vitals filed for this visit.  Medications Reviewed Today   Medications were not reviewed in this encounter     Recommendation:   Pursue LTC placement and [facility] Participate with PT services in current SNF facility   Follow Up Plan:   LCSW to call Theodora Fish on September 17, 2023 at 10:00 AM   Alexandria Angel  MSW, LCSW [place]/Value Based Care [facility] Licensed Clinical Social Worker Direct Dial:  [phone] Fax:  [phone] Website:  [email or web address]

## 2023-09-09 NOTE — Patient Instructions (Signed)
 Visit Information  Thank you for taking time to visit with me today. Please don't hesitate to contact me if I can be of assistance to you before our next scheduled appointment.  Our next appointment is by telephone on September 17, 2023 at 10:00 AM   Please call the care guide team at [phone] if you need to cancel or reschedule your appointment.    Please call the [facility]: [phone] if you are experiencing a Mental Health or Behavioral Health Crisis or need someone to talk to.  Patient's sister said she  accesses My Chart for updates for client   Alexandria Angel  MSW, LCSW [place]/[facility] Licensed Clinical Social Worker Direct Dial:  [phone] Fax:  [phone] Website:  [email or web address]

## 2023-09-10 ENCOUNTER — Other Ambulatory Visit: Payer: Self-pay

## 2023-09-10 DIAGNOSIS — E876 Hypokalemia: Secondary | ICD-10-CM | POA: Diagnosis not present

## 2023-09-10 NOTE — Patient Outreach (Signed)
 Complex Care Management   Visit Note  09/10/2023  Name:  Diane Freeman MRN: [id number] DOB: [birth year]  Situation: Referral received for Complex Care Management related to SDOH Barriers:  Financial Resource Strain and assisted living resources.  I obtained verbal consent from Caregiver.  Visit completed with caregiver  on the phone  Background:   Past Medical History:  Diagnosis Date   Abdominal pain 12/03/2022   Adrenal nodule (HCC)    Anemia    Blood transfusion without reported diagnosis    Cataract    bil removed   Choledocholithiasis with obstruction    Colitis    Crohn's colitis (HCC) 07/11/2012   Crohn's disease (HCC) 06/10/2013   Dementia (HCC)  Acute encephalopathy, resolved. 07/21/2023   Diarrhea 08/31/2023   Diastolic dysfunction    a. 05/2013 Echo: EF 60-65%, no rwma, GrI DD, mild-mod TR.   Diverticulosis    Elevated blood pressure reading 02/21/2021   ESOPHAGEAL STRICTURE 11/27/2007   Qualifier: History of   By: Nelson-Smith CMA (AAMA), [name]         Esophagitis determined by endoscopy 06/11/2023   EGD showed LA Grade D reflux esophagitis 06/2023     Fatty liver    GERD with esophagitis 11/27/2007   EGD showed LA Grade D reflux esophagitis 06/2023        Hematemesis 06/09/2023   Hiatal hernia    History of esophageal stricture    History of left heart catheterization    a. 05/2013 Cath: Nl cors. Nl R heart filling pressures.  No L -> R shunt.  EF 65%.   Hx of gastritis    Hyperlipidemia 01/24/2018   Lower extremity edema 12/03/2022   Palpitations    a. 09/2014 Zio: Rare PACs/PVCs.   Pyelonephritis 05/29/2022   Right shoulder pain 12/03/2022   Sepsis (HCC) 12/03/2022   UTI (urinary tract infection) 05/14/2013    Assessment: BSW outreached patients caregiver to follow up on resources provided. Caregiver received resources. BSW let caregiver know to reach out if there was anything else they needed assistance with.       Recommendation:   No  recommendations at this time.   Follow Up Plan:   Patient has met all care management goals. Care Management case will be closed. Patient has been provided contact information should new needs arise.   Haven Lion, BSW [place]  [facility] Institute Social Worker, [facility] [phone]

## 2023-09-10 NOTE — Patient Instructions (Signed)
 Visit Information  Thank you for taking time to visit with me today. Please don't hesitate to contact me if I can be of assistance to you.  Your next care management appointment is no further scheduled appointments.    Patient has met all care management goals. Care Management case will be closed. Patient has been provided contact information should new needs arise.   Please call the care guide team at [phone] if you need to cancel, schedule, or reschedule an appointment.   Please call the Suicide and Crisis Lifeline: 988 call the [place]  National Suicide Prevention Lifeline: 218-177-7824 or TTY: (559)035-7813 TTY 640-427-5448) to talk to a trained counselor call 1-800-273-TALK (toll free, 24 hour hotline) call 911 if you are experiencing a Mental Health or Behavioral Health Crisis or need someone to talk to.  Haven Lion, BSW [place]  [facility] Social Worker, [facility] [phone]

## 2023-09-15 ENCOUNTER — Encounter: Payer: Self-pay | Admitting: Gastroenterology

## 2023-09-17 ENCOUNTER — Other Ambulatory Visit: Payer: Self-pay

## 2023-09-17 ENCOUNTER — Other Ambulatory Visit: Payer: Self-pay | Admitting: Licensed Clinical Social Worker

## 2023-09-17 DIAGNOSIS — E440 Moderate protein-calorie malnutrition: Secondary | ICD-10-CM | POA: Diagnosis not present

## 2023-09-17 DIAGNOSIS — K5090 Crohn's disease, unspecified, without complications: Secondary | ICD-10-CM | POA: Diagnosis not present

## 2023-09-17 DIAGNOSIS — I82419 Acute embolism and thrombosis of unspecified femoral vein: Secondary | ICD-10-CM | POA: Diagnosis not present

## 2023-09-17 DIAGNOSIS — I2699 Other pulmonary embolism without acute cor pulmonale: Secondary | ICD-10-CM | POA: Diagnosis not present

## 2023-09-17 NOTE — Patient Instructions (Signed)
 Visit Information  Thank you for taking time to visit with me today. Please don't hesitate to contact me if I can be of assistance to you before our next scheduled appointment.  Our next appointment is by telephone on 10/22/23 at 4:00 PM    Please call the care guide team at [phone] if you need to cancel or reschedule your appointment.   Following is a copy of your care plan:   Goals Addressed             This Visit's Progress    VBCI Social Work Care Plan       Problems:   Unsteady Gait; balance issues; risk for fall            Decreased appetite            LOC issues; she plans to go tomorrow to [facility] in [place], [place] to receive care at that SNF             Fatigue; decreased energy            Some memory challenges  CSW Clinical Goal(s):   Over the next 30 days the Patient will attend all scheduled medical appointments as evidenced by patient report and care team review of appointment completion in electronic MEDICAL RECORD NUMBER .             Over next 30 days, patient will adjust to LOC changes including admitting to [facility] facility AEB patient report of adequate adjusting to [facility] facility for care  Interventions:  LCSW spoke today with Theodora Fish, sister and contact for client, regarding client status and needs. Patty said client is currently at [facility] in [place], [place] and that tomorrow patient plans to discharge from [facility] and admit to [facility] in [place], [place]             Discussed appetite of client; Discussed sleeping issues of client. Discussed pain management of client          Discussed ambulation of client. Client has been receiving some physical therapy sessions at [facility] SNF facility           Discussed family support for client            Discussed medical care for client once she is resident at [facility]           Discussed briefly the long term care plans for client  needs            Encouraged Theodora Fish or client to call LCSW as needed for SW support for client  Patient Goals/Self-Care Activities:  Continue taking your medication as prescribed.               Attend scheduled medical appointments             Allow time for ADLs completion             Participate in recreational activities of choice at SNF facility              Communicate regularly with sister Theodora Fish, to discuss needs of client             Call LCSW as needed for SW support  Plan:   Telephone follow up appointment with care management team member scheduled for:  10/22/23 at 4:00 PM         Please go  to [facility] [address], [place] [phone]) if you are experiencing a Mental Health or Behavioral Health Crisis or need someone to talk to.  The patient / Theodora Fish, sister and contact for client, verbalized understanding of instructions, educational materials, and care plan provided today and DECLINED offer to receive copy of patient instructions, educational materials, and care plan.    Alexandria Angel  MSW, LCSW [place]/[facility] Licensed Clinical Social Worker Direct Dial:  [phone] Fax:  [phone] Website:  [email or web address]

## 2023-09-17 NOTE — Patient Outreach (Signed)
 Complex Care Management   Visit Note  09/17/2023  Name:  Diane Freeman MRN: [id number] DOB: 1946-03-17  Situation: Referral received for Complex Care Management related to LOC needs I obtained verbal consent from Theodora Fish, sister and contact for client.  Visit completed with Theodora Fish  on the phone  Background:   Past Medical History:  Diagnosis Date   Abdominal pain 12/03/2022   Adrenal nodule (HCC)    Anemia    Blood transfusion without reported diagnosis    Cataract    bil removed   Choledocholithiasis with obstruction    Colitis    Crohn's colitis (HCC) 07/11/2012   Crohn's disease (HCC) 06/10/2013   Dementia (HCC)  Acute encephalopathy, resolved. 07/21/2023   Diarrhea 08/31/2023   Diverticulosis    Elevated blood pressure reading 02/21/2021   ESOPHAGEAL STRICTURE 11/27/2007   Qualifier: History of   By: Nelson-Smith CMA (AAMA), [name]         Esophagitis determined by endoscopy 06/11/2023   EGD showed LA Grade D reflux esophagitis 06/2023     Fatty liver    GERD with esophagitis 11/27/2007   EGD showed LA Grade D reflux esophagitis 06/2023        Hematemesis 06/09/2023   Hiatal hernia    History of esophageal stricture    History of left heart catheterization    a. 05/2013 Cath: Nl cors. Nl R heart filling pressures.  No L -> R shunt.  EF 65%.   Hx of gastritis    Hyperlipidemia 01/24/2018   Lower extremity edema 12/03/2022   Palpitations    a. 09/2014 Zio: Rare PACs/PVCs.   Pyelonephritis 05/29/2022   Right shoulder pain 12/03/2022   Sepsis (HCC) 12/03/2022   UTI (urinary tract infection) 05/14/2013    Assessment: Patient Reported Symptoms:  Cognitive Cognitive Status: Able to follow simple commands   Health Maintenance Behaviors: Sleep adequate Health Facilitated by: Rest  Neurological Neurological Review of Symptoms: No symptoms reported Neurological Management Strategies: Adequate rest, Coping strategies  HEENT HEENT Symptoms Reported: No  symptoms reported HEENT Management Strategies: Adequate rest, Coping strategies    Cardiovascular Cardiovascular Symptoms Reported: Fatigue Cardiovascular Management Strategies: Adequate rest, Coping strategies. CHF  Respiratory Respiratory Symptoms Reported: Shortness of breath Respiratory Conditions: Shortness of breath  Endocrine Patient reports the following symptoms related to hypoglycemia or hyperglycemia : Shortness of breath, Weakness or fatigue Is patient diabetic?: No Endocrine Management Strategies: Coping strategies, Adequate rest  Gastrointestinal Additional Gastrointestinal Details: GERD; malnutrition Gastrointestinal Management Strategies: Adequate rest, Coping strategies    Genitourinary   Genitourinary Management Strategies: Adequate rest, Coping strategies  Integumentary Integumentary Symptoms Reported: No symptoms reported Skin Management Strategies: Adequate rest, Coping strategies  Musculoskeletal Musculoskelatal Symptoms Reviewed: Difficulty walking, Weakness, Unsteady gait Additional Musculoskeletal Details: has participated recently in some physical therapy sessions at current SNF Musculoskeletal Management Strategies: Adequate rest, Coping strategies      Psychosocial Psychosocial Symptoms Reported: Depression - if selected complete PHQ 2-9, Sadness - if selected complete PHQ 2-9 Additional Psychological Details: anxiety issues; sadness; fatigue Behavioral Health Conditions: Depression, Anxiety Behavioral Management Strategies: Adequate rest, Coping strategies Major Change/Loss/Stressor/Fears (CP): Medical condition, self Techniques to Cope with Loss/Stress/Change: Counseling Quality of Family Relationships: supportive, helpful Do you feel physically threatened by others?: No      09/17/2023    3:26 PM  Depression screen PHQ 2/9  Decreased Interest 1  Down, Depressed, Hopeless 1  PHQ - 2 Score 2  Altered sleeping 0  Tired, decreased energy  3  Change in  appetite 3  Feeling bad or failure about yourself  1  Trouble concentrating 1  Moving slowly or fidgety/restless 1  Suicidal thoughts 0  PHQ-9 Score 11  Difficult doing work/chores Somewhat difficult    Vitals:   BP for client is being monitored by medical staff at SNF Medications Reviewed Today     Reviewed by Afton Horse (Social Worker) on 09/17/23 at 1514  Med List Status: <None>   Medication Order Taking? Sig Documenting Provider Last Dose Status Informant  acetaminophen  (TYLENOL ) 500 MG tablet [id number] Yes Take 1,000 mg by mouth every 6 (six) hours as needed. [provider]  Active Family Member, Pharmacy Records  apixaban  (ELIQUIS ) 5 MG TABS tablet [id number] Yes Take 1 tablet (5 mg total) by mouth 2 (two) times daily. Everhart, Kirstie, DO  Active Family Member, Pharmacy Records  Cholecalciferol  (VITAMIN D3 SUPER STRENGTH) 50 MCG (2000 UT) TABS [id number] Yes Take 1 tablet (2,000 Units total) by mouth every morning. Everhart, Kirstie, DO  Active Family Member, Pharmacy Records  feeding supplement (ENSURE PLUS HIGH PROTEIN) LIQD [id number] Yes Take 237 mLs by mouth 2 (two) times daily between meals. Wilhemena Harbour, MD  Active   ferrous sulfate  325 (65 FE) MG tablet [id number] Yes Take 1 tablet (325 mg total) by mouth daily with breakfast. Everhart, Kirstie, DO  Active Family Member, Pharmacy Records  folic acid  (FOLVITE ) 400 MCG tablet [id number] Yes Take 1 tablet (400 mcg total) by mouth every morning. Everhart, Kirstie, DO  Active Family Member, Pharmacy Records  furosemide  (LASIX ) 20 MG tablet [id number] Yes Take 1 tablet (20 mg total) by mouth daily. [name], DO  Active   mesalamine  (APRISO ) 0.375 g 24 hr capsule [id number] Unknown Take 4 capsules (1.5 g total) by mouth daily. Please keep your appointment in July for any further refills. Thank you  Patient taking differently: Take 1.5 g by mouth daily.   Ace Holder, MD  Active Family Member,  Pharmacy Records  pantoprazole  (PROTONIX ) 40 MG tablet [id number] Yes Take 1 tablet (40 mg total) by mouth 2 (two) times daily. Everhart, Kirstie, DO  Active Family Member, Pharmacy Records  spironolactone  (ALDACTONE ) 25 MG tablet [id number] Yes Take 0.5 tablets (12.5 mg total) by mouth daily. [name], DO  Active             Recommendation:   PCP Follow-up Take medications as prescribed Client plans to discharge from [facility] SNF tomorrow and plans to admit tomorrow to [facility] in [place], [place] Allow time for rest and relaxation Allow time for ADLs completion Call LCSW as needed for SW support  Follow Up Plan:   Telephone follow up appointment date/time:  10/22/23 at 4:00 PM    Alexandria Angel  MSW, LCSW [place]/Value Based Care [facility] Licensed Clinical Social Worker Direct Dial:  [phone] Fax:  [phone] Website:  [email or web address]

## 2023-09-19 DIAGNOSIS — M6281 Muscle weakness (generalized): Secondary | ICD-10-CM | POA: Diagnosis not present

## 2023-09-19 DIAGNOSIS — K5090 Crohn's disease, unspecified, without complications: Secondary | ICD-10-CM | POA: Diagnosis not present

## 2023-09-19 DIAGNOSIS — Z79899 Other long term (current) drug therapy: Secondary | ICD-10-CM | POA: Diagnosis not present

## 2023-09-19 DIAGNOSIS — Z86718 Personal history of other venous thrombosis and embolism: Secondary | ICD-10-CM | POA: Diagnosis not present

## 2023-09-19 DIAGNOSIS — K2100 Gastro-esophageal reflux disease with esophagitis, without bleeding: Secondary | ICD-10-CM | POA: Diagnosis not present

## 2023-09-19 DIAGNOSIS — R269 Unspecified abnormalities of gait and mobility: Secondary | ICD-10-CM | POA: Diagnosis not present

## 2023-09-19 DIAGNOSIS — R296 Repeated falls: Secondary | ICD-10-CM | POA: Diagnosis not present

## 2023-09-19 DIAGNOSIS — I480 Paroxysmal atrial fibrillation: Secondary | ICD-10-CM | POA: Diagnosis not present

## 2023-09-19 DIAGNOSIS — Z7189 Other specified counseling: Secondary | ICD-10-CM | POA: Diagnosis not present

## 2023-09-20 ENCOUNTER — Encounter: Admitting: Gastroenterology

## 2023-09-20 DIAGNOSIS — R296 Repeated falls: Secondary | ICD-10-CM | POA: Diagnosis not present

## 2023-09-20 DIAGNOSIS — M6281 Muscle weakness (generalized): Secondary | ICD-10-CM | POA: Diagnosis not present

## 2023-09-20 DIAGNOSIS — I480 Paroxysmal atrial fibrillation: Secondary | ICD-10-CM | POA: Diagnosis not present

## 2023-09-20 DIAGNOSIS — R269 Unspecified abnormalities of gait and mobility: Secondary | ICD-10-CM | POA: Diagnosis not present

## 2023-09-23 ENCOUNTER — Inpatient Hospital Stay: Payer: Self-pay | Admitting: Family Medicine

## 2023-09-23 DIAGNOSIS — E785 Hyperlipidemia, unspecified: Secondary | ICD-10-CM | POA: Diagnosis not present

## 2023-09-23 DIAGNOSIS — K7581 Nonalcoholic steatohepatitis (NASH): Secondary | ICD-10-CM | POA: Diagnosis not present

## 2023-09-23 DIAGNOSIS — I5030 Unspecified diastolic (congestive) heart failure: Secondary | ICD-10-CM | POA: Diagnosis not present

## 2023-09-23 DIAGNOSIS — Z79899 Other long term (current) drug therapy: Secondary | ICD-10-CM | POA: Diagnosis not present

## 2023-09-24 DIAGNOSIS — K2100 Gastro-esophageal reflux disease with esophagitis, without bleeding: Secondary | ICD-10-CM | POA: Diagnosis not present

## 2023-09-24 DIAGNOSIS — R269 Unspecified abnormalities of gait and mobility: Secondary | ICD-10-CM | POA: Diagnosis not present

## 2023-09-24 DIAGNOSIS — M6281 Muscle weakness (generalized): Secondary | ICD-10-CM | POA: Diagnosis not present

## 2023-09-24 DIAGNOSIS — I480 Paroxysmal atrial fibrillation: Secondary | ICD-10-CM | POA: Diagnosis not present

## 2023-09-24 DIAGNOSIS — R296 Repeated falls: Secondary | ICD-10-CM | POA: Diagnosis not present

## 2023-09-24 DIAGNOSIS — Z79899 Other long term (current) drug therapy: Secondary | ICD-10-CM | POA: Diagnosis not present

## 2023-09-24 DIAGNOSIS — I5030 Unspecified diastolic (congestive) heart failure: Secondary | ICD-10-CM | POA: Diagnosis not present

## 2023-09-25 DIAGNOSIS — Z86718 Personal history of other venous thrombosis and embolism: Secondary | ICD-10-CM | POA: Diagnosis not present

## 2023-09-25 DIAGNOSIS — I5030 Unspecified diastolic (congestive) heart failure: Secondary | ICD-10-CM | POA: Diagnosis not present

## 2023-09-25 DIAGNOSIS — M6281 Muscle weakness (generalized): Secondary | ICD-10-CM | POA: Diagnosis not present

## 2023-09-25 DIAGNOSIS — K5090 Crohn's disease, unspecified, without complications: Secondary | ICD-10-CM | POA: Diagnosis not present

## 2023-09-25 DIAGNOSIS — K7581 Nonalcoholic steatohepatitis (NASH): Secondary | ICD-10-CM | POA: Diagnosis not present

## 2023-09-25 DIAGNOSIS — K2100 Gastro-esophageal reflux disease with esophagitis, without bleeding: Secondary | ICD-10-CM | POA: Diagnosis not present

## 2023-09-25 DIAGNOSIS — R296 Repeated falls: Secondary | ICD-10-CM | POA: Diagnosis not present

## 2023-09-25 DIAGNOSIS — R269 Unspecified abnormalities of gait and mobility: Secondary | ICD-10-CM | POA: Diagnosis not present

## 2023-09-25 DIAGNOSIS — I480 Paroxysmal atrial fibrillation: Secondary | ICD-10-CM | POA: Diagnosis not present

## 2023-09-25 DIAGNOSIS — E785 Hyperlipidemia, unspecified: Secondary | ICD-10-CM | POA: Diagnosis not present

## 2023-09-26 DIAGNOSIS — M6281 Muscle weakness (generalized): Secondary | ICD-10-CM | POA: Diagnosis not present

## 2023-09-26 DIAGNOSIS — I480 Paroxysmal atrial fibrillation: Secondary | ICD-10-CM | POA: Diagnosis not present

## 2023-09-26 DIAGNOSIS — R269 Unspecified abnormalities of gait and mobility: Secondary | ICD-10-CM | POA: Diagnosis not present

## 2023-09-26 DIAGNOSIS — R296 Repeated falls: Secondary | ICD-10-CM | POA: Diagnosis not present

## 2023-09-27 DIAGNOSIS — R296 Repeated falls: Secondary | ICD-10-CM | POA: Diagnosis not present

## 2023-09-27 DIAGNOSIS — M6281 Muscle weakness (generalized): Secondary | ICD-10-CM | POA: Diagnosis not present

## 2023-09-27 DIAGNOSIS — R269 Unspecified abnormalities of gait and mobility: Secondary | ICD-10-CM | POA: Diagnosis not present

## 2023-09-27 DIAGNOSIS — D649 Anemia, unspecified: Secondary | ICD-10-CM | POA: Diagnosis not present

## 2023-09-27 DIAGNOSIS — E785 Hyperlipidemia, unspecified: Secondary | ICD-10-CM | POA: Diagnosis not present

## 2023-09-27 DIAGNOSIS — I5030 Unspecified diastolic (congestive) heart failure: Secondary | ICD-10-CM | POA: Diagnosis not present

## 2023-09-27 DIAGNOSIS — R634 Abnormal weight loss: Secondary | ICD-10-CM | POA: Diagnosis not present

## 2023-09-27 DIAGNOSIS — I480 Paroxysmal atrial fibrillation: Secondary | ICD-10-CM | POA: Diagnosis not present

## 2023-09-27 DIAGNOSIS — Z79899 Other long term (current) drug therapy: Secondary | ICD-10-CM | POA: Diagnosis not present

## 2023-09-30 DIAGNOSIS — I480 Paroxysmal atrial fibrillation: Secondary | ICD-10-CM | POA: Diagnosis not present

## 2023-09-30 DIAGNOSIS — I5030 Unspecified diastolic (congestive) heart failure: Secondary | ICD-10-CM | POA: Diagnosis not present

## 2023-09-30 DIAGNOSIS — R296 Repeated falls: Secondary | ICD-10-CM | POA: Diagnosis not present

## 2023-09-30 DIAGNOSIS — E785 Hyperlipidemia, unspecified: Secondary | ICD-10-CM | POA: Diagnosis not present

## 2023-09-30 DIAGNOSIS — D649 Anemia, unspecified: Secondary | ICD-10-CM | POA: Diagnosis not present

## 2023-09-30 DIAGNOSIS — R269 Unspecified abnormalities of gait and mobility: Secondary | ICD-10-CM | POA: Diagnosis not present

## 2023-09-30 DIAGNOSIS — M6281 Muscle weakness (generalized): Secondary | ICD-10-CM | POA: Diagnosis not present

## 2023-10-01 DIAGNOSIS — R296 Repeated falls: Secondary | ICD-10-CM | POA: Diagnosis not present

## 2023-10-01 DIAGNOSIS — I480 Paroxysmal atrial fibrillation: Secondary | ICD-10-CM | POA: Diagnosis not present

## 2023-10-01 DIAGNOSIS — M6281 Muscle weakness (generalized): Secondary | ICD-10-CM | POA: Diagnosis not present

## 2023-10-01 DIAGNOSIS — R269 Unspecified abnormalities of gait and mobility: Secondary | ICD-10-CM | POA: Diagnosis not present

## 2023-10-02 DIAGNOSIS — I480 Paroxysmal atrial fibrillation: Secondary | ICD-10-CM | POA: Diagnosis not present

## 2023-10-02 DIAGNOSIS — M6281 Muscle weakness (generalized): Secondary | ICD-10-CM | POA: Diagnosis not present

## 2023-10-02 DIAGNOSIS — R296 Repeated falls: Secondary | ICD-10-CM | POA: Diagnosis not present

## 2023-10-02 DIAGNOSIS — R269 Unspecified abnormalities of gait and mobility: Secondary | ICD-10-CM | POA: Diagnosis not present

## 2023-10-03 DIAGNOSIS — R296 Repeated falls: Secondary | ICD-10-CM | POA: Diagnosis not present

## 2023-10-03 DIAGNOSIS — I480 Paroxysmal atrial fibrillation: Secondary | ICD-10-CM | POA: Diagnosis not present

## 2023-10-03 DIAGNOSIS — M6281 Muscle weakness (generalized): Secondary | ICD-10-CM | POA: Diagnosis not present

## 2023-10-03 DIAGNOSIS — R269 Unspecified abnormalities of gait and mobility: Secondary | ICD-10-CM | POA: Diagnosis not present

## 2023-10-04 DIAGNOSIS — M6281 Muscle weakness (generalized): Secondary | ICD-10-CM | POA: Diagnosis not present

## 2023-10-04 DIAGNOSIS — R296 Repeated falls: Secondary | ICD-10-CM | POA: Diagnosis not present

## 2023-10-04 DIAGNOSIS — I480 Paroxysmal atrial fibrillation: Secondary | ICD-10-CM | POA: Diagnosis not present

## 2023-10-04 DIAGNOSIS — R269 Unspecified abnormalities of gait and mobility: Secondary | ICD-10-CM | POA: Diagnosis not present

## 2023-10-06 DIAGNOSIS — R269 Unspecified abnormalities of gait and mobility: Secondary | ICD-10-CM | POA: Diagnosis not present

## 2023-10-06 DIAGNOSIS — M6281 Muscle weakness (generalized): Secondary | ICD-10-CM | POA: Diagnosis not present

## 2023-10-06 DIAGNOSIS — I480 Paroxysmal atrial fibrillation: Secondary | ICD-10-CM | POA: Diagnosis not present

## 2023-10-06 DIAGNOSIS — R296 Repeated falls: Secondary | ICD-10-CM | POA: Diagnosis not present

## 2023-10-07 DIAGNOSIS — I480 Paroxysmal atrial fibrillation: Secondary | ICD-10-CM | POA: Diagnosis not present

## 2023-10-07 DIAGNOSIS — M6281 Muscle weakness (generalized): Secondary | ICD-10-CM | POA: Diagnosis not present

## 2023-10-07 DIAGNOSIS — R269 Unspecified abnormalities of gait and mobility: Secondary | ICD-10-CM | POA: Diagnosis not present

## 2023-10-07 DIAGNOSIS — R296 Repeated falls: Secondary | ICD-10-CM | POA: Diagnosis not present

## 2023-10-08 ENCOUNTER — Ambulatory Visit: Admitting: Gastroenterology

## 2023-10-08 DIAGNOSIS — R269 Unspecified abnormalities of gait and mobility: Secondary | ICD-10-CM | POA: Diagnosis not present

## 2023-10-08 DIAGNOSIS — I480 Paroxysmal atrial fibrillation: Secondary | ICD-10-CM | POA: Diagnosis not present

## 2023-10-08 DIAGNOSIS — M6281 Muscle weakness (generalized): Secondary | ICD-10-CM | POA: Diagnosis not present

## 2023-10-08 DIAGNOSIS — R296 Repeated falls: Secondary | ICD-10-CM | POA: Diagnosis not present

## 2023-10-09 DIAGNOSIS — R296 Repeated falls: Secondary | ICD-10-CM | POA: Diagnosis not present

## 2023-10-09 DIAGNOSIS — I480 Paroxysmal atrial fibrillation: Secondary | ICD-10-CM | POA: Diagnosis not present

## 2023-10-09 DIAGNOSIS — R269 Unspecified abnormalities of gait and mobility: Secondary | ICD-10-CM | POA: Diagnosis not present

## 2023-10-09 DIAGNOSIS — M6281 Muscle weakness (generalized): Secondary | ICD-10-CM | POA: Diagnosis not present

## 2023-10-11 DIAGNOSIS — R296 Repeated falls: Secondary | ICD-10-CM | POA: Diagnosis not present

## 2023-10-11 DIAGNOSIS — R269 Unspecified abnormalities of gait and mobility: Secondary | ICD-10-CM | POA: Diagnosis not present

## 2023-10-11 DIAGNOSIS — M6281 Muscle weakness (generalized): Secondary | ICD-10-CM | POA: Diagnosis not present

## 2023-10-11 DIAGNOSIS — I480 Paroxysmal atrial fibrillation: Secondary | ICD-10-CM | POA: Diagnosis not present

## 2023-10-11 DIAGNOSIS — M25512 Pain in left shoulder: Secondary | ICD-10-CM | POA: Diagnosis not present

## 2023-10-14 DIAGNOSIS — R296 Repeated falls: Secondary | ICD-10-CM | POA: Diagnosis not present

## 2023-10-14 DIAGNOSIS — M6281 Muscle weakness (generalized): Secondary | ICD-10-CM | POA: Diagnosis not present

## 2023-10-14 DIAGNOSIS — I480 Paroxysmal atrial fibrillation: Secondary | ICD-10-CM | POA: Diagnosis not present

## 2023-10-14 DIAGNOSIS — R269 Unspecified abnormalities of gait and mobility: Secondary | ICD-10-CM | POA: Diagnosis not present

## 2023-10-15 DIAGNOSIS — R296 Repeated falls: Secondary | ICD-10-CM | POA: Diagnosis not present

## 2023-10-15 DIAGNOSIS — R269 Unspecified abnormalities of gait and mobility: Secondary | ICD-10-CM | POA: Diagnosis not present

## 2023-10-15 DIAGNOSIS — I480 Paroxysmal atrial fibrillation: Secondary | ICD-10-CM | POA: Diagnosis not present

## 2023-10-15 DIAGNOSIS — M6281 Muscle weakness (generalized): Secondary | ICD-10-CM | POA: Diagnosis not present

## 2023-10-16 DIAGNOSIS — M6281 Muscle weakness (generalized): Secondary | ICD-10-CM | POA: Diagnosis not present

## 2023-10-16 DIAGNOSIS — I480 Paroxysmal atrial fibrillation: Secondary | ICD-10-CM | POA: Diagnosis not present

## 2023-10-16 DIAGNOSIS — R269 Unspecified abnormalities of gait and mobility: Secondary | ICD-10-CM | POA: Diagnosis not present

## 2023-10-16 DIAGNOSIS — R296 Repeated falls: Secondary | ICD-10-CM | POA: Diagnosis not present

## 2023-10-17 DIAGNOSIS — I480 Paroxysmal atrial fibrillation: Secondary | ICD-10-CM | POA: Diagnosis not present

## 2023-10-17 DIAGNOSIS — M6281 Muscle weakness (generalized): Secondary | ICD-10-CM | POA: Diagnosis not present

## 2023-10-17 DIAGNOSIS — R269 Unspecified abnormalities of gait and mobility: Secondary | ICD-10-CM | POA: Diagnosis not present

## 2023-10-17 DIAGNOSIS — R296 Repeated falls: Secondary | ICD-10-CM | POA: Diagnosis not present

## 2023-10-18 DIAGNOSIS — I480 Paroxysmal atrial fibrillation: Secondary | ICD-10-CM | POA: Diagnosis not present

## 2023-10-18 DIAGNOSIS — M6281 Muscle weakness (generalized): Secondary | ICD-10-CM | POA: Diagnosis not present

## 2023-10-18 DIAGNOSIS — R269 Unspecified abnormalities of gait and mobility: Secondary | ICD-10-CM | POA: Diagnosis not present

## 2023-10-18 DIAGNOSIS — R296 Repeated falls: Secondary | ICD-10-CM | POA: Diagnosis not present

## 2023-10-22 ENCOUNTER — Other Ambulatory Visit: Payer: Self-pay | Admitting: Licensed Clinical Social Worker

## 2023-10-22 NOTE — Patient Instructions (Signed)
 Visit Information  Thank you for taking time to visit with me today. Please don't hesitate to contact me if I can be of assistance to you before our next scheduled appointment.  Our next appointment is by telephone on 12/03/23 at 2:00 PM   Please call the care guide team at [phone] if you need to cancel or reschedule your appointment.   Following is a copy of your care plan:   Goals Addressed             This Visit's Progress    VBCI Social Work Care Plan       Problems:   Unsteady Gait; balance issues; risk for fall            Decreased appetite            Has walker to use as needed to help her ambulate            LOC issues; client is now resident at [facility] in [place], [place]             Fatigue; decreased energy            Some memory challenges             Needs help with ADLs such as bathing and dressing  CSW Clinical Goal(s):   Over the next 30 days the Patient will attend all scheduled medical appointments as evidenced by patient report and care team review of appointment completion in electronic medical record.              Over next 30 days, patient will adjust to LOC changes including admitting to [facility] facility AEB patient report of adequate adjusting to [facility] facility for care  Interventions: LCSW spoke today with Odetta Palin, sister and contact for client, regarding client status and needs. Patty said client now is a patient at [facility] in [place], [place]             Discussed appetite of client. Patty said client has reduced appetite and eats small meals.; Discussed sleeping issues of client. Discussed pain management of client          Discussed ambulation of client. Client had  been receiving some physical therapy sessions at [facility] facility. She may be getting a few PT sessions at [facility]. Patty said sometimes client may refuse PT sessions           Discussed family support for  client. Patty said client has strong family support            Discussed medical care for client at [facility] facility            Discussed briefly the long term care plans for client needs. Patty feels that client may need to remain at SNF for LTC           Discussed transport assistance for client at [facility] facility          Discussed insurance of client            Encouraged Odetta Palin or client to call LCSW as needed for SW support for client          Odetta was appreciative of call from LCSW today  Patient Goals/Self-Care Activities:  Continue taking your medication as prescribed.               Attend scheduled medical appointments  Allow time for ADLs completion             Participate in recreational activities of choice at SNF facility              Communicate regularly with sister Odetta Palin, to discuss needs of client             Call LCSW as needed for SW support  Plan:   Telephone follow up appointment with care management team member scheduled for:  12/03/23 at 2:00 PM         Please go to [facility] [address] [place] [phone]) if you are experiencing a Mental Health or Behavioral Health Crisis or need someone to talk to.  The patient / Odetta Palin, sister of client, verbalized understanding of instructions, educational materials, and care plan provided today and DECLINED offer to receive copy of patient instructions, educational materials, and care plan.    Glendia Pear  MSW, LCSW [place]/[facility] Licensed Clinical Social Worker Direct Dial:  [phone] Fax:  [phone] Website:  [email or web address]

## 2023-10-22 NOTE — Patient Outreach (Signed)
 Complex Care Management   Visit Note  10/22/2023  Name:  Diane Freeman MRN: [id number] DOB: [birth year]  Situation: Referral received for Complex Care Management related to LOC issues I obtained verbal consent from Odetta Palin , sister of client.  Visit completed with Odetta Palin, sister of client  on the phone  Background:   Past Medical History:  Diagnosis Date   Abdominal pain 12/03/2022   Adrenal nodule (HCC)    Anemia    Blood transfusion without reported diagnosis    Cataract    bil removed   Choledocholithiasis with obstruction    Colitis    Crohn's colitis (HCC) 07/11/2012   Crohn's disease (HCC) 06/10/2013   Dementia (HCC)  Acute encephalopathy, resolved. 07/21/2023   Diarrhea 08/31/2023   Diverticulosis    Elevated blood pressure reading 02/21/2021   ESOPHAGEAL STRICTURE 11/27/2007   Qualifier: History of   By: Nelson-Smith CMA (AAMA), [name]         Esophagitis determined by endoscopy 06/11/2023   EGD showed LA Grade D reflux esophagitis 06/2023     Fatty liver    GERD with esophagitis 11/27/2007   EGD showed LA Grade D reflux esophagitis 06/2023        Hematemesis 06/09/2023   Hiatal hernia    History of esophageal stricture    History of left heart catheterization    a. 05/2013 Cath: Nl cors. Nl R heart filling pressures.  No L -> R shunt.  EF 65%.   Hx of gastritis    Hyperlipidemia 01/24/2018   Lower extremity edema 12/03/2022   Palpitations    a. 09/2014 Zio: Rare PACs/PVCs.   Pyelonephritis 05/29/2022   Right shoulder pain 12/03/2022   Sepsis (HCC) 12/03/2022   UTI (urinary tract infection) 05/14/2013    Assessment: Patient Reported Symptoms:  Cognitive Cognitive Status: Difficulties with attention and concentration, Struggling with memory recall Cognitive/Intellectual Conditions Management [RPT]: None reported or documented in medical history or problem list   Health Maintenance Behaviors: Sleep adequate Health Facilitated by: Stress  management, Rest  Neurological Neurological Review of Symptoms: Weakness Neurological Management Strategies: Adequate rest, Coping strategies  HEENT HEENT Symptoms Reported: No symptoms reported HEENT Management Strategies: Coping strategies, Adequate rest    Cardiovascular Cardiovascular Symptoms Reported: Fatigue, Dizziness Cardiovascular Management Strategies: Adequate rest, Coping strategies  Respiratory Respiratory Symptoms Reported: Shortness of breath Other Respiratory Symptoms: fatigues on walking Respiratory Management Strategies: Adequate rest, Coping strategies  Endocrine Endocrine Symptoms Reported: Weakness or fatigue, Shakiness, Shortness of breath, Increased urination Is patient diabetic?: No    Gastrointestinal Additional Gastrointestinal Details: GERD ; malnutrition Gastrointestinal Management Strategies: Adequate rest, Coping strategies    Genitourinary Genitourinary Symptoms Reported: Frequency Genitourinary Management Strategies: Adequate rest, Coping strategies  Integumentary Integumentary Symptoms Reported: No symptoms reported Skin Management Strategies: Adequate rest, Coping strategies  Musculoskeletal Musculoskelatal Symptoms Reviewed: Difficulty walking, Limited mobility, Unsteady gait, Weakness Musculoskeletal Management Strategies: Adequate rest, Activity, Coping strategies      Psychosocial Psychosocial Symptoms Reported: Anxiety - if selected complete GAD, Sadness - if selected complete PHQ 2-9, Depression - if selected complete PHQ 2-9 Behavioral Management Strategies: Coping strategies, Adequate rest Major Change/Loss/Stressor/Fears (CP): Medical condition, self Techniques to Cope with Loss/Stress/Change: Medication Quality of Family Relationships: supportive Do you feel physically threatened by others?: No      10/22/2023    1:44 PM  Depression screen PHQ 2/9  Decreased Interest 1  Down, Depressed, Hopeless 1  PHQ - 2 Score 2  Altered sleeping 1  Tired, decreased energy 1  Change in appetite 1  Feeling bad or failure about yourself  1  Trouble concentrating 1  Moving slowly or fidgety/restless 1  Suicidal thoughts 0  PHQ-9 Score 8  Difficult doing work/chores Somewhat difficult    Vitals:   BP of client is being monitored at SNF facility  Medications Reviewed Today     Reviewed by Frances Ozell GORMAN KEN (Social Worker) on 10/22/23 at 1332  Med List Status: <None>   Medication Order Taking? Sig Documenting Provider Last Dose Status Informant  acetaminophen  (TYLENOL ) 500 MG tablet [id number] Yes Take 1,000 mg by mouth every 6 (six) hours as needed. [provider]  Active Family Member, Pharmacy Records  apixaban  (ELIQUIS ) 5 MG TABS tablet [id number] Yes Take 1 tablet (5 mg total) by mouth 2 (two) times daily. Everhart, Kirstie, DO  Active Family Member, Pharmacy Records  Cholecalciferol  (VITAMIN D3 SUPER STRENGTH) 50 MCG (2000 UT) TABS [id number] Yes Take 1 tablet (2,000 Units total) by mouth every morning. Everhart, Kirstie, DO  Active Family Member, Pharmacy Records  feeding supplement (ENSURE PLUS HIGH PROTEIN) LIQD [id number] Not taking Take 237 mLs by mouth 2 (two) times daily between meals.  Patient not taking: Reported on 10/22/2023   Jennelle Riis, MD  Active   ferrous sulfate  325 (65 FE) MG tablet [id number] Yes Take 1 tablet (325 mg total) by mouth daily with breakfast. Everhart, Kirstie, DO  Active Family Member, Pharmacy Records  folic acid  (FOLVITE ) 400 MCG tablet [id number] unknown Take 1 tablet (400 mcg total) by mouth every morning. Everhart, Kirstie, DO  Active Family Member, Pharmacy Records  furosemide  (LASIX ) 20 MG tablet [id number] unknown Take 1 tablet (20 mg total) by mouth daily. Cleotilde Lukes, DO  Active   mesalamine  (APRISO ) 0.375 g 24 hr capsule [id number] unknown Take 4 capsules (1.5 g total) by mouth daily. Please keep your appointment in July for any further refills. Thank you  Patient taking  differently: Take 1.5 g by mouth daily.   Leigh Elspeth SQUIBB, MD  Active Family Member, Pharmacy Records  pantoprazole  (PROTONIX ) 40 MG tablet [id number] Taking  Take 1 tablet (40 mg total) by mouth 2 (two) times daily. Everhart, Kirstie, DO  Active Family Member, Pharmacy Records  spironolactone  (ALDACTONE ) 25 MG tablet [id number] Unknown  Take 0.5 tablets (12.5 mg total) by mouth daily. Cleotilde Lukes, DO  Active             Recommendation:   PCP Follow-up Continue Current Plan of Care Take medications as prescribed Participate in PT sessions at SNF as scheduled Participate in social activities of choice at [facility] Call LCSW as needed for SW support  Follow Up Plan:   Telephone follow up appointment date/time:  12/03/23 at 2:00 PM    Glendia Frances  MSW, LCSW [place]/Value Based [facility] Licensed Clinical Social Worker Direct Dial:  [phone] Fax:  [phone] Website:  [email or web address]

## 2023-10-30 DIAGNOSIS — I480 Paroxysmal atrial fibrillation: Secondary | ICD-10-CM | POA: Diagnosis not present

## 2023-10-30 DIAGNOSIS — K7581 Nonalcoholic steatohepatitis (NASH): Secondary | ICD-10-CM | POA: Diagnosis not present

## 2023-10-30 DIAGNOSIS — E785 Hyperlipidemia, unspecified: Secondary | ICD-10-CM | POA: Diagnosis not present

## 2023-10-30 DIAGNOSIS — I5030 Unspecified diastolic (congestive) heart failure: Secondary | ICD-10-CM | POA: Diagnosis not present

## 2023-10-30 DIAGNOSIS — K2100 Gastro-esophageal reflux disease with esophagitis, without bleeding: Secondary | ICD-10-CM | POA: Diagnosis not present

## 2023-10-30 DIAGNOSIS — D649 Anemia, unspecified: Secondary | ICD-10-CM | POA: Diagnosis not present

## 2023-10-30 DIAGNOSIS — Z86718 Personal history of other venous thrombosis and embolism: Secondary | ICD-10-CM | POA: Diagnosis not present

## 2023-10-30 DIAGNOSIS — K5090 Crohn's disease, unspecified, without complications: Secondary | ICD-10-CM | POA: Diagnosis not present

## 2023-11-19 DIAGNOSIS — Z79899 Other long term (current) drug therapy: Secondary | ICD-10-CM | POA: Diagnosis not present

## 2023-11-19 DIAGNOSIS — I5030 Unspecified diastolic (congestive) heart failure: Secondary | ICD-10-CM | POA: Diagnosis not present

## 2023-11-19 DIAGNOSIS — I959 Hypotension, unspecified: Secondary | ICD-10-CM | POA: Diagnosis not present

## 2023-11-25 DIAGNOSIS — Z79899 Other long term (current) drug therapy: Secondary | ICD-10-CM | POA: Diagnosis not present

## 2023-11-27 DIAGNOSIS — K7581 Nonalcoholic steatohepatitis (NASH): Secondary | ICD-10-CM | POA: Diagnosis not present

## 2023-11-27 DIAGNOSIS — Z86718 Personal history of other venous thrombosis and embolism: Secondary | ICD-10-CM | POA: Diagnosis not present

## 2023-11-27 DIAGNOSIS — K5090 Crohn's disease, unspecified, without complications: Secondary | ICD-10-CM | POA: Diagnosis not present

## 2023-11-27 DIAGNOSIS — K2100 Gastro-esophageal reflux disease with esophagitis, without bleeding: Secondary | ICD-10-CM | POA: Diagnosis not present

## 2023-11-27 DIAGNOSIS — I5030 Unspecified diastolic (congestive) heart failure: Secondary | ICD-10-CM | POA: Diagnosis not present

## 2023-11-27 DIAGNOSIS — D649 Anemia, unspecified: Secondary | ICD-10-CM | POA: Diagnosis not present

## 2023-11-27 DIAGNOSIS — F03A0 Unspecified dementia, mild, without behavioral disturbance, psychotic disturbance, mood disturbance, and anxiety: Secondary | ICD-10-CM | POA: Diagnosis not present

## 2023-11-27 DIAGNOSIS — I480 Paroxysmal atrial fibrillation: Secondary | ICD-10-CM | POA: Diagnosis not present

## 2023-11-27 DIAGNOSIS — E785 Hyperlipidemia, unspecified: Secondary | ICD-10-CM | POA: Diagnosis not present

## 2023-12-03 ENCOUNTER — Other Ambulatory Visit: Payer: Self-pay | Admitting: Licensed Clinical Social Worker

## 2023-12-03 NOTE — Patient Instructions (Signed)
 Visit Information  Thank you for taking time to visit with me today. Please don't hesitate to contact me if I can be of assistance to you before our next scheduled appointment.  No follow up calls needed from LCSW for client  Please call the care guide team at [phone] if you need to cancel or reschedule your appointment.   Following is a copy of your care plan:   Goals Addressed             This Visit's Progress    VBCI Social Work Care Plan       Problems:   Unsteady Gait; balance issues; risk for fall            Decreased appetite            Has walker to use as needed to help her ambulate            LOC issues; client is now resident at [facility] in [place], [place]             Fatigue; decreased energy            Some memory challenges             Needs help with ADLs such as bathing and dressing  CSW Clinical Goal(s):   Over the next 30 days the Patient will attend all scheduled medical appointments as evidenced by patient report and care team review of appointment completion in electronic medical record.              Over next 30 days, patient will adjust to LOC changes including admitting to [facility] facility AEB patient report of adequate adjusting to [facility] facility for care  Interventions:  LCSW spoke today with Odetta Palin, sister and contact for client, regarding client status and needs. Patty said client now is a patient at [facility] in [place], [place]            Discussed family support for client. Patty said client has strong family support            Discussed medical care for client at [facility] facility            Discussed briefly the long term care plans for client needs. Patty feels that client needs to remain at SNF for LTC           Informed Patty that when a client goes to SNF LOC and plans to remain at SNF LOC that program discharges client from program support services. Patty said she understood this  information. LCSW informed Patty that LCSW would discharge client from program services today since client will be at Long Term SNF level of Care.  Patty agreed to this plan          Odetta was appreciative of call from LCSW today  Patient Goals/Self-Care Activities:  Continue taking your medication as prescribed.               Attend scheduled medical appointments             Allow time for ADLs completion             Participate in recreational activities of choice at SNF facility              Communicate regularly with sister Odetta Palin, to discuss needs of client  Plan:   No follow up calls needed from LCSW         Please go to [facility] [address], [place] [phone]) if you are experiencing a Mental Health or Behavioral Health Crisis or need someone to talk to.  The patient / Odetta Palin, sister of patient, verbalized understanding of instructions, educational materials, and care plan provided today and DECLINED offer to receive copy of patient instructions, educational materials, and care plan.     Glendia Pear  MSW, LCSW [place]/[facility] Licensed Clinical Social Worker Direct Dial:  [phone] Fax:  [phone] Website:  [email or web address]

## 2023-12-03 NOTE — Patient Outreach (Signed)
 Complex Care Management   Visit Note  12/03/2023  Name:  Diane Freeman MRN: [id number] DOB: 1945/12/10  Situation: Referral received for Complex Care Management related to LOC issues  I obtained verbal consent from Diane Freeman , sister and contact for client .  Visit completed with Diane Freeman, sister of client today   on the phone  Background:   Past Medical History:  Diagnosis Date   Abdominal pain 12/03/2022   Adrenal nodule (HCC)    Anemia    Blood transfusion without reported diagnosis    Cataract    bil removed   Choledocholithiasis with obstruction    Colitis    Crohn's colitis (HCC) 07/11/2012   Crohn's disease (HCC) 06/10/2013   Dementia (HCC)  Acute encephalopathy, resolved. 07/21/2023   Diarrhea 08/31/2023   Diverticulosis    Elevated blood pressure reading 02/21/2021   ESOPHAGEAL STRICTURE 11/27/2007   Qualifier: History of   By: Nelson-Smith CMA (AAMA), [name]         Esophagitis determined by endoscopy 06/11/2023   EGD showed LA Grade D reflux esophagitis 06/2023     Fatty liver    GERD with esophagitis 11/27/2007   EGD showed LA Grade D reflux esophagitis 06/2023        Hematemesis 06/09/2023   Hiatal hernia    History of esophageal stricture    History of left heart catheterization    a. 05/2013 Cath: Nl cors. Nl R heart filling pressures.  No L -> R shunt.  EF 65%.   Hx of gastritis    Hyperlipidemia 01/24/2018   Lower extremity edema 12/03/2022   Palpitations    a. 09/2014 Zio: Rare PACs/PVCs.   Pyelonephritis 05/29/2022   Right shoulder pain 12/03/2022   Sepsis (HCC) 12/03/2022   UTI (urinary tract infection) 05/14/2013    Assessment: Patient Reported Symptoms:  Cognitive Cognitive Status: Difficulties with attention and concentration, Struggling with memory recall Cognitive/Intellectual Conditions Management [RPT]: None reported or documented in medical history or problem list   Health Maintenance Behaviors: Sleep adequate, Stress  management Health Facilitated by: Stress management  Neurological Neurological Review of Symptoms: Weakness Neurological Management Strategies: Coping strategies  HEENT HEENT Symptoms Reported: No symptoms reported HEENT Management Strategies: Coping strategies    Cardiovascular Cardiovascular Symptoms Reported: Dizziness, Fatigue Cardiovascular Management Strategies: Adequate rest  Respiratory Respiratory Symptoms Reported: Shortness of breath Other Respiratory Symptoms: fatigue on walking Respiratory Management Strategies: Coping strategies  Endocrine Endocrine Symptoms Reported: Weakness or fatigue, Shortness of breath, Increased urination Is patient diabetic?: No    Gastrointestinal Gastrointestinal Symptoms Reported: Reflux/heartburn Additional Gastrointestinal Details: GERD Gastrointestinal Management Strategies: Adequate rest    Genitourinary Genitourinary Symptoms Reported: Frequency Genitourinary Management Strategies: Adequate rest  Integumentary Integumentary Symptoms Reported: No symptoms reported Skin Management Strategies: Adequate rest, Coping strategies, Medication therapy  Musculoskeletal Musculoskelatal Symptoms Reviewed: Difficulty walking, Limited mobility, Unsteady gait Musculoskeletal Management Strategies: Medication therapy, Routine screening, Weight management      Psychosocial Psychosocial Symptoms Reported: Sadness - if selected complete PHQ 2-9, Anxiety - if selected complete GAD Additional Psychological Details: anxiety issues; sadness; fatigue Behavioral Management Strategies: Coping strategies, Counseling, Medication therapy, Support system Major Change/Loss/Stressor/Fears (CP): Medical condition, self Techniques to Cope with Loss/Stress/Change: Medication, Support group Quality of Family Relationships: supportive Do you feel physically threatened by others?: No Client is now residing at [facility] in [place]    12/03/2023     PHQ2-9 Depression Screening   Little interest or pleasure in doing things Several  days  Feeling down, depressed, or hopeless Several days  PHQ-2 - Total Score 2  Trouble falling or staying asleep, or sleeping too much Several days  Feeling tired or having little energy Several days  Poor appetite or overeating  Several days  Feeling bad about yourself - or that you are a failure or have let yourself or your family down Several days  Trouble concentrating on things, such as reading the newspaper or watching television Several days  Moving or speaking so slowly that other people could have noticed.  Or the opposite - being so fidgety or restless that you have been moving around a lot more than usual Several days  Thoughts that you would be better off dead, or hurting yourself in some way Not at all  PHQ2-9 Total Score 8  If you checked off any problems, how difficult have these problems made it for you to do your work, take care of things at home, or get along with other people Somewhat difficult  Depression Interventions/Treatment Medication, Counseling    Vitals:  No BP problems for client were noted during LCSW call today with Diane Freeman  Medications Reviewed Today   Medications were not reviewed in this encounter     Recommendation:   PCP Follow-up as needed Continue to adjust to schedule and activities and care support at SNF  Follow Up Plan:   No follow up calls needed from LCSW for client   Diane Freeman  MSW, LCSW /Value Based Care [facility] Licensed Clinical Social Worker Direct Dial:  [phone] Fax:  [phone] Website:  [email or web address]

## 2023-12-06 DIAGNOSIS — Z9181 History of falling: Secondary | ICD-10-CM | POA: Diagnosis not present

## 2023-12-06 DIAGNOSIS — R2681 Unsteadiness on feet: Secondary | ICD-10-CM | POA: Diagnosis not present

## 2023-12-06 DIAGNOSIS — R1319 Other dysphagia: Secondary | ICD-10-CM | POA: Diagnosis not present

## 2023-12-06 DIAGNOSIS — I480 Paroxysmal atrial fibrillation: Secondary | ICD-10-CM | POA: Diagnosis not present

## 2023-12-10 DIAGNOSIS — R1319 Other dysphagia: Secondary | ICD-10-CM | POA: Diagnosis not present

## 2023-12-10 DIAGNOSIS — I480 Paroxysmal atrial fibrillation: Secondary | ICD-10-CM | POA: Diagnosis not present

## 2023-12-10 DIAGNOSIS — Z9181 History of falling: Secondary | ICD-10-CM | POA: Diagnosis not present

## 2023-12-10 DIAGNOSIS — R2681 Unsteadiness on feet: Secondary | ICD-10-CM | POA: Diagnosis not present

## 2023-12-13 DIAGNOSIS — I480 Paroxysmal atrial fibrillation: Secondary | ICD-10-CM | POA: Diagnosis not present

## 2023-12-13 DIAGNOSIS — R2681 Unsteadiness on feet: Secondary | ICD-10-CM | POA: Diagnosis not present

## 2023-12-13 DIAGNOSIS — Z9181 History of falling: Secondary | ICD-10-CM | POA: Diagnosis not present

## 2023-12-13 DIAGNOSIS — R1319 Other dysphagia: Secondary | ICD-10-CM | POA: Diagnosis not present

## 2023-12-14 DIAGNOSIS — Z9181 History of falling: Secondary | ICD-10-CM | POA: Diagnosis not present

## 2023-12-14 DIAGNOSIS — I480 Paroxysmal atrial fibrillation: Secondary | ICD-10-CM | POA: Diagnosis not present

## 2023-12-14 DIAGNOSIS — R2681 Unsteadiness on feet: Secondary | ICD-10-CM | POA: Diagnosis not present

## 2023-12-14 DIAGNOSIS — R1319 Other dysphagia: Secondary | ICD-10-CM | POA: Diagnosis not present

## 2023-12-17 DIAGNOSIS — Z9181 History of falling: Secondary | ICD-10-CM | POA: Diagnosis not present

## 2023-12-17 DIAGNOSIS — R1319 Other dysphagia: Secondary | ICD-10-CM | POA: Diagnosis not present

## 2023-12-17 DIAGNOSIS — I480 Paroxysmal atrial fibrillation: Secondary | ICD-10-CM | POA: Diagnosis not present

## 2023-12-17 DIAGNOSIS — Z79899 Other long term (current) drug therapy: Secondary | ICD-10-CM | POA: Diagnosis not present

## 2023-12-17 DIAGNOSIS — R2681 Unsteadiness on feet: Secondary | ICD-10-CM | POA: Diagnosis not present

## 2023-12-17 DIAGNOSIS — I5030 Unspecified diastolic (congestive) heart failure: Secondary | ICD-10-CM | POA: Diagnosis not present

## 2023-12-17 DIAGNOSIS — K5090 Crohn's disease, unspecified, without complications: Secondary | ICD-10-CM | POA: Diagnosis not present

## 2023-12-18 DIAGNOSIS — R2681 Unsteadiness on feet: Secondary | ICD-10-CM | POA: Diagnosis not present

## 2023-12-18 DIAGNOSIS — Z9181 History of falling: Secondary | ICD-10-CM | POA: Diagnosis not present

## 2023-12-18 DIAGNOSIS — R1319 Other dysphagia: Secondary | ICD-10-CM | POA: Diagnosis not present

## 2023-12-18 DIAGNOSIS — I480 Paroxysmal atrial fibrillation: Secondary | ICD-10-CM | POA: Diagnosis not present

## 2023-12-19 DIAGNOSIS — L409 Psoriasis, unspecified: Secondary | ICD-10-CM | POA: Diagnosis not present

## 2023-12-19 DIAGNOSIS — R21 Rash and other nonspecific skin eruption: Secondary | ICD-10-CM | POA: Diagnosis not present

## 2023-12-19 DIAGNOSIS — I480 Paroxysmal atrial fibrillation: Secondary | ICD-10-CM | POA: Diagnosis not present

## 2023-12-19 DIAGNOSIS — Z79899 Other long term (current) drug therapy: Secondary | ICD-10-CM | POA: Diagnosis not present

## 2023-12-19 DIAGNOSIS — Z9181 History of falling: Secondary | ICD-10-CM | POA: Diagnosis not present

## 2023-12-19 DIAGNOSIS — R2681 Unsteadiness on feet: Secondary | ICD-10-CM | POA: Diagnosis not present

## 2023-12-19 DIAGNOSIS — R1319 Other dysphagia: Secondary | ICD-10-CM | POA: Diagnosis not present

## 2023-12-20 DIAGNOSIS — R2681 Unsteadiness on feet: Secondary | ICD-10-CM | POA: Diagnosis not present

## 2023-12-20 DIAGNOSIS — Z9181 History of falling: Secondary | ICD-10-CM | POA: Diagnosis not present

## 2023-12-20 DIAGNOSIS — I480 Paroxysmal atrial fibrillation: Secondary | ICD-10-CM | POA: Diagnosis not present

## 2023-12-20 DIAGNOSIS — R1319 Other dysphagia: Secondary | ICD-10-CM | POA: Diagnosis not present

## 2023-12-22 DIAGNOSIS — R1319 Other dysphagia: Secondary | ICD-10-CM | POA: Diagnosis not present

## 2023-12-22 DIAGNOSIS — Z9181 History of falling: Secondary | ICD-10-CM | POA: Diagnosis not present

## 2023-12-22 DIAGNOSIS — I480 Paroxysmal atrial fibrillation: Secondary | ICD-10-CM | POA: Diagnosis not present

## 2023-12-22 DIAGNOSIS — R2681 Unsteadiness on feet: Secondary | ICD-10-CM | POA: Diagnosis not present

## 2023-12-23 DIAGNOSIS — R1319 Other dysphagia: Secondary | ICD-10-CM | POA: Diagnosis not present

## 2023-12-23 DIAGNOSIS — K2100 Gastro-esophageal reflux disease with esophagitis, without bleeding: Secondary | ICD-10-CM | POA: Diagnosis not present

## 2023-12-23 DIAGNOSIS — Z9181 History of falling: Secondary | ICD-10-CM | POA: Diagnosis not present

## 2023-12-23 DIAGNOSIS — Z79899 Other long term (current) drug therapy: Secondary | ICD-10-CM | POA: Diagnosis not present

## 2023-12-23 DIAGNOSIS — R2681 Unsteadiness on feet: Secondary | ICD-10-CM | POA: Diagnosis not present

## 2023-12-23 DIAGNOSIS — I480 Paroxysmal atrial fibrillation: Secondary | ICD-10-CM | POA: Diagnosis not present

## 2023-12-31 DIAGNOSIS — R2681 Unsteadiness on feet: Secondary | ICD-10-CM | POA: Diagnosis not present

## 2023-12-31 DIAGNOSIS — I480 Paroxysmal atrial fibrillation: Secondary | ICD-10-CM | POA: Diagnosis not present

## 2023-12-31 DIAGNOSIS — Z9181 History of falling: Secondary | ICD-10-CM | POA: Diagnosis not present

## 2023-12-31 DIAGNOSIS — R1319 Other dysphagia: Secondary | ICD-10-CM | POA: Diagnosis not present

## 2024-01-02 DIAGNOSIS — Z9181 History of falling: Secondary | ICD-10-CM | POA: Diagnosis not present

## 2024-01-02 DIAGNOSIS — I480 Paroxysmal atrial fibrillation: Secondary | ICD-10-CM | POA: Diagnosis not present

## 2024-01-02 DIAGNOSIS — R2681 Unsteadiness on feet: Secondary | ICD-10-CM | POA: Diagnosis not present

## 2024-01-08 DIAGNOSIS — I480 Paroxysmal atrial fibrillation: Secondary | ICD-10-CM | POA: Diagnosis not present

## 2024-01-08 DIAGNOSIS — R2681 Unsteadiness on feet: Secondary | ICD-10-CM | POA: Diagnosis not present

## 2024-01-08 DIAGNOSIS — Z9181 History of falling: Secondary | ICD-10-CM | POA: Diagnosis not present

## 2024-01-10 DIAGNOSIS — R2681 Unsteadiness on feet: Secondary | ICD-10-CM | POA: Diagnosis not present

## 2024-01-10 DIAGNOSIS — Z9181 History of falling: Secondary | ICD-10-CM | POA: Diagnosis not present

## 2024-01-10 DIAGNOSIS — I480 Paroxysmal atrial fibrillation: Secondary | ICD-10-CM | POA: Diagnosis not present

## 2024-01-23 DIAGNOSIS — K2100 Gastro-esophageal reflux disease with esophagitis, without bleeding: Secondary | ICD-10-CM | POA: Diagnosis not present

## 2024-01-23 DIAGNOSIS — I480 Paroxysmal atrial fibrillation: Secondary | ICD-10-CM | POA: Diagnosis not present

## 2024-01-23 DIAGNOSIS — Z79899 Other long term (current) drug therapy: Secondary | ICD-10-CM | POA: Diagnosis not present

## 2024-01-23 DIAGNOSIS — I5030 Unspecified diastolic (congestive) heart failure: Secondary | ICD-10-CM | POA: Diagnosis not present

## 2024-02-12 DIAGNOSIS — K2100 Gastro-esophageal reflux disease with esophagitis, without bleeding: Secondary | ICD-10-CM | POA: Diagnosis not present

## 2024-02-12 DIAGNOSIS — Z86718 Personal history of other venous thrombosis and embolism: Secondary | ICD-10-CM | POA: Diagnosis not present

## 2024-02-12 DIAGNOSIS — L409 Psoriasis, unspecified: Secondary | ICD-10-CM | POA: Diagnosis not present

## 2024-02-12 DIAGNOSIS — I480 Paroxysmal atrial fibrillation: Secondary | ICD-10-CM | POA: Diagnosis not present

## 2024-02-12 DIAGNOSIS — K7581 Nonalcoholic steatohepatitis (NASH): Secondary | ICD-10-CM | POA: Diagnosis not present

## 2024-02-12 DIAGNOSIS — K5090 Crohn's disease, unspecified, without complications: Secondary | ICD-10-CM | POA: Diagnosis not present

## 2024-02-12 DIAGNOSIS — E785 Hyperlipidemia, unspecified: Secondary | ICD-10-CM | POA: Diagnosis not present

## 2024-02-12 DIAGNOSIS — I5030 Unspecified diastolic (congestive) heart failure: Secondary | ICD-10-CM | POA: Diagnosis not present

## 2024-02-12 DIAGNOSIS — F03A0 Unspecified dementia, mild, without behavioral disturbance, psychotic disturbance, mood disturbance, and anxiety: Secondary | ICD-10-CM | POA: Diagnosis not present

## 2024-02-12 DIAGNOSIS — D649 Anemia, unspecified: Secondary | ICD-10-CM | POA: Diagnosis not present
